# Patient Record
Sex: Female | Born: 1955 | Hispanic: No | Marital: Married | Smoking: Never smoker
Health system: Southern US, Community
[De-identification: ages and names within clinical notes are randomized; demographics above are authoritative.]

## PROBLEM LIST (undated history)

## (undated) DIAGNOSIS — N926 Irregular menstruation, unspecified: Secondary | ICD-10-CM

## (undated) DIAGNOSIS — T8859XA Other complications of anesthesia, initial encounter: Secondary | ICD-10-CM

## (undated) DIAGNOSIS — E039 Hypothyroidism, unspecified: Secondary | ICD-10-CM

## (undated) DIAGNOSIS — H269 Unspecified cataract: Secondary | ICD-10-CM

## (undated) DIAGNOSIS — M1990 Unspecified osteoarthritis, unspecified site: Secondary | ICD-10-CM

## (undated) DIAGNOSIS — K759 Inflammatory liver disease, unspecified: Secondary | ICD-10-CM

## (undated) DIAGNOSIS — Z87898 Personal history of other specified conditions: Secondary | ICD-10-CM

## (undated) DIAGNOSIS — L680 Hirsutism: Secondary | ICD-10-CM

## (undated) DIAGNOSIS — D259 Leiomyoma of uterus, unspecified: Secondary | ICD-10-CM

## (undated) DIAGNOSIS — Z8619 Personal history of other infectious and parasitic diseases: Secondary | ICD-10-CM

## (undated) DIAGNOSIS — R51 Headache: Secondary | ICD-10-CM

## (undated) DIAGNOSIS — F419 Anxiety disorder, unspecified: Secondary | ICD-10-CM

## (undated) HISTORY — DX: Unspecified cataract: H26.9

## (undated) HISTORY — DX: Personal history of other specified conditions: Z87.898

## (undated) HISTORY — DX: Leiomyoma of uterus, unspecified: D25.9

## (undated) HISTORY — PX: TONSILLECTOMY: SUR1361

## (undated) HISTORY — DX: Hypothyroidism, unspecified: E03.9

## (undated) HISTORY — DX: Personal history of other infectious and parasitic diseases: Z86.19

## (undated) HISTORY — DX: Headache: R51

## (undated) HISTORY — PX: KNEE SURGERY: SHX244

## (undated) HISTORY — PX: TONSILECTOMY, ADENOIDECTOMY, BILATERAL MYRINGOTOMY AND TUBES: SHX2538

## (undated) HISTORY — DX: Hirsutism: L68.0

## (undated) HISTORY — PX: KNEE ARTHROPLASTY: SHX992

## (undated) HISTORY — DX: Irregular menstruation, unspecified: N92.6

---

## 1996-03-31 DIAGNOSIS — N926 Irregular menstruation, unspecified: Secondary | ICD-10-CM

## 1996-03-31 DIAGNOSIS — Z87898 Personal history of other specified conditions: Secondary | ICD-10-CM

## 1996-03-31 DIAGNOSIS — L680 Hirsutism: Secondary | ICD-10-CM

## 1996-03-31 HISTORY — DX: Personal history of other specified conditions: Z87.898

## 1996-03-31 HISTORY — DX: Hirsutism: L68.0

## 1996-03-31 HISTORY — DX: Irregular menstruation, unspecified: N92.6

## 1998-12-02 ENCOUNTER — Encounter: Payer: Self-pay | Admitting: Rheumatology

## 1998-12-02 ENCOUNTER — Ambulatory Visit (HOSPITAL_COMMUNITY): Admission: RE | Admit: 1998-12-02 | Discharge: 1998-12-02 | Payer: Self-pay | Admitting: Rheumatology

## 1999-04-01 DIAGNOSIS — D259 Leiomyoma of uterus, unspecified: Secondary | ICD-10-CM

## 1999-04-01 HISTORY — DX: Leiomyoma of uterus, unspecified: D25.9

## 1999-07-31 ENCOUNTER — Ambulatory Visit (HOSPITAL_COMMUNITY): Admission: RE | Admit: 1999-07-31 | Discharge: 1999-07-31 | Payer: Self-pay | Admitting: Cardiovascular Disease

## 1999-08-08 ENCOUNTER — Encounter: Payer: Self-pay | Admitting: Cardiovascular Disease

## 1999-08-08 ENCOUNTER — Encounter: Admission: RE | Admit: 1999-08-08 | Discharge: 1999-08-08 | Payer: Self-pay | Admitting: Cardiovascular Disease

## 1999-09-22 ENCOUNTER — Inpatient Hospital Stay (HOSPITAL_COMMUNITY): Admission: AD | Admit: 1999-09-22 | Discharge: 1999-09-22 | Payer: Self-pay | Admitting: *Deleted

## 1999-12-03 ENCOUNTER — Other Ambulatory Visit: Admission: RE | Admit: 1999-12-03 | Discharge: 1999-12-03 | Payer: Self-pay | Admitting: Obstetrics and Gynecology

## 1999-12-04 ENCOUNTER — Other Ambulatory Visit: Admission: RE | Admit: 1999-12-04 | Discharge: 1999-12-04 | Payer: Self-pay | Admitting: Obstetrics and Gynecology

## 1999-12-04 ENCOUNTER — Encounter (INDEPENDENT_AMBULATORY_CARE_PROVIDER_SITE_OTHER): Payer: Self-pay

## 1999-12-13 ENCOUNTER — Inpatient Hospital Stay (HOSPITAL_COMMUNITY): Admission: AD | Admit: 1999-12-13 | Discharge: 1999-12-13 | Payer: Self-pay | Admitting: Obstetrics and Gynecology

## 2000-03-31 HISTORY — PX: DILATION AND CURETTAGE OF UTERUS: SHX78

## 2003-04-19 ENCOUNTER — Encounter (INDEPENDENT_AMBULATORY_CARE_PROVIDER_SITE_OTHER): Payer: Self-pay | Admitting: Specialist

## 2003-04-19 ENCOUNTER — Ambulatory Visit (HOSPITAL_COMMUNITY): Admission: RE | Admit: 2003-04-19 | Discharge: 2003-04-19 | Payer: Self-pay | Admitting: Gastroenterology

## 2006-03-31 HISTORY — PX: KNEE ARTHROSCOPY: SHX127

## 2007-02-15 ENCOUNTER — Ambulatory Visit (HOSPITAL_COMMUNITY): Admission: RE | Admit: 2007-02-15 | Discharge: 2007-02-15 | Payer: Self-pay | Admitting: Cardiovascular Disease

## 2007-09-29 ENCOUNTER — Encounter: Admission: RE | Admit: 2007-09-29 | Discharge: 2007-09-29 | Payer: Self-pay | Admitting: Cardiovascular Disease

## 2010-08-16 NOTE — Op Note (Signed)
NAMEJIN, SHOCKLEY                       ACCOUNT NO.:  [id number]   MEDICAL RECORD NO.:  [id number]                   PATIENT TYPE:  AMB   LOCATION:  ENDO                                 FACILITY:  [facility]   PHYSICIAN:  Anselmo Rod, M.D.               DATE OF BIRTH:  [birth year]   DATE OF PROCEDURE:  04/19/2003  DATE OF DISCHARGE:                                 OPERATIVE REPORT   PROCEDURE PERFORMED:  Colonoscopy with snare polypectomy times two and cold  biopsies times five.   ENDOSCOPIST:  Charna Elizabeth, M.D.   INSTRUMENT USED:  Olympus video colonoscope.   INDICATIONS FOR PROCEDURE:  The patient is a 55 year old Asian female with a  history of rectal bleeding on one occasion.  Rule out colonic polyps,  masses, etc. The patient has a family history of ovarian cancer in her  mother, who was diagnosed at the age of 47 but no known family history of  colon cancer.   PREPROCEDURE PREPARATION:  Informed consent was procured from the patient.  The patient was fasted for eight hours prior to the procedure and prepped  with a bottle of magnesium citrate and a gallon of GoLYTELY the night prior  to the procedure.   PREPROCEDURE PHYSICAL:  The patient had stable vital signs.  Neck supple.  Chest clear to auscultation.  S1 and S2 regular.  Abdomen soft with normal  bowel sounds.   DESCRIPTION OF PROCEDURE:  The patient was placed in left lateral decubitus  position and sedated with 100 mg of Demerol and 10 mg of Versed in slow  incremental doses.  Once the patient was adequately sedated and maintained  on low flow oxygen and continuous cardiac monitoring, the Olympus video  colonoscope was advanced from the rectum to the cecum.  The appendicular  orifice and ileocecal valve were clearly visualized and photographed.  The  terminal ileum appeared healthy and without lesions.  A small erosion was  biopsied from the cecal base.  A small sessile polyp was biopsied from the  distal  right colon.  Two small sessile polyps were snared from the rectum  and the rectosigmoid respectively and placed together in a pathology bottle.  Retroflexion in the rectum revealed no abnormalities.  The patient tolerated  the procedure well without complications.   IMPRESSION:  1. Small sessile polyp snared from rectum and rectosigmoid colon.  2. Small polyp biopsied from the distal right colon.  3. Erosion biopsied from the cecal base.  4. Normal terminal ileum.   RECOMMENDATIONS:  1. Await pathology results.  2. Avoid all nonsteroidals including aspirin for the next four weeks.  3. A high fiber diet with liberal fluid intake has been advocated.  4. Repeat colorectal cancer screening depending on pathology results.  5. Upper GI with small bowel follow-through if rectal bleeding persists.  Anselmo Rod, M.D.    JNM/MEDQ  D:  04/19/2003  T:  04/20/2003  Job:  [id number]

## 2011-10-24 ENCOUNTER — Telehealth: Payer: Self-pay | Admitting: Obstetrics and Gynecology

## 2011-11-10 ENCOUNTER — Other Ambulatory Visit: Payer: Self-pay | Admitting: Cardiovascular Disease

## 2011-11-10 ENCOUNTER — Ambulatory Visit
Admission: RE | Admit: 2011-11-10 | Discharge: 2011-11-10 | Disposition: A | Discharge status: Home / Self Care | Payer: No Typology Code available for payment source | Source: Ambulatory Visit | Attending: Cardiovascular Disease | Admitting: Cardiovascular Disease

## 2011-11-10 DIAGNOSIS — R51 Headache: Secondary | ICD-10-CM

## 2011-11-13 ENCOUNTER — Encounter: Payer: Self-pay | Admitting: Obstetrics and Gynecology

## 2011-11-13 ENCOUNTER — Ambulatory Visit (INDEPENDENT_AMBULATORY_CARE_PROVIDER_SITE_OTHER): Payer: BC Managed Care – PPO | Admitting: Obstetrics and Gynecology

## 2011-11-13 VITALS — BP 104/64 | Ht 63.75 in | Wt 228.0 lb

## 2011-11-13 DIAGNOSIS — R519 Headache, unspecified: Secondary | ICD-10-CM | POA: Insufficient documentation

## 2011-11-13 DIAGNOSIS — Z87898 Personal history of other specified conditions: Secondary | ICD-10-CM | POA: Insufficient documentation

## 2011-11-13 DIAGNOSIS — L680 Hirsutism: Secondary | ICD-10-CM | POA: Insufficient documentation

## 2011-11-13 DIAGNOSIS — R102 Pelvic and perineal pain: Secondary | ICD-10-CM

## 2011-11-13 DIAGNOSIS — L659 Nonscarring hair loss, unspecified: Secondary | ICD-10-CM | POA: Insufficient documentation

## 2011-11-13 DIAGNOSIS — Z8619 Personal history of other infectious and parasitic diseases: Secondary | ICD-10-CM | POA: Insufficient documentation

## 2011-11-13 DIAGNOSIS — E119 Type 2 diabetes mellitus without complications: Secondary | ICD-10-CM | POA: Insufficient documentation

## 2011-11-13 DIAGNOSIS — N644 Mastodynia: Secondary | ICD-10-CM

## 2011-11-13 DIAGNOSIS — Z124 Encounter for screening for malignant neoplasm of cervix: Secondary | ICD-10-CM

## 2011-11-13 DIAGNOSIS — Z01419 Encounter for gynecological examination (general) (routine) without abnormal findings: Secondary | ICD-10-CM

## 2011-11-13 DIAGNOSIS — N926 Irregular menstruation, unspecified: Secondary | ICD-10-CM | POA: Insufficient documentation

## 2011-11-13 DIAGNOSIS — N941 Unspecified dyspareunia: Secondary | ICD-10-CM | POA: Insufficient documentation

## 2011-11-13 DIAGNOSIS — E039 Hypothyroidism, unspecified: Secondary | ICD-10-CM | POA: Insufficient documentation

## 2011-11-13 DIAGNOSIS — R51 Headache: Secondary | ICD-10-CM | POA: Insufficient documentation

## 2011-11-13 DIAGNOSIS — D259 Leiomyoma of uterus, unspecified: Secondary | ICD-10-CM | POA: Insufficient documentation

## 2011-11-13 DIAGNOSIS — N949 Unspecified condition associated with female genital organs and menstrual cycle: Secondary | ICD-10-CM

## 2011-11-13 MED ORDER — ESTRADIOL 0.1 MG/GM VA CREA: TOPICAL_CREAM | VAGINAL | Status: DC

## 2011-11-13 NOTE — Progress Notes (Signed)
Subjective:  AEX:  Last Pap: 2001 WNL: Yes Regular Periods:no Contraception: Post-Menopause  Monthly Breast exam:no Tetanus<72yrs:no Nl.Bladder Function:yes Daily BMs:yes Healthy Diet:yes Calcium:no Mammogram:no never Date of Mammogram: n/a Exercise:yes Have often Exercise: occasionally Seatbelt: yes Abuse at home: no Stressful work:yes Sigmoid-colonoscopy: 2005 Bone Density: No PCP: [place].  She actually travels to [place] For an annual visit with her doctor daycare and purchases her medications there. Change in PMH: None Change in [facility]: mother died of ovarian cancer at age 36    Krystal Lang is a 56 y.o. female G2P2002 who presents for annual exam.  She was last seen in this office in 2002 and that was her last Pap smear. She complains of being hot all the time. She had hot flashes 10 years ago but they seem to have improved but now have gotten worse. She has in the interim been diagnosed with type 2 diabetes and hypothyroidism.  She complains of alopecia which is a family trait and her mother and sisters. She also has axillary accessory breast tissue which is quite tender. This is likewise a family trait. She complains of decreased libido and dyspareunia  The following portions of the patient's history were reviewed and updated as appropriate: allergies, current medications, past family history, past medical history, past social history, past surgical history and problem list.  Review of Systems Pertinent items are noted in HPI. Gastrointestinal:No change in bowel habits, no abdominal pain, no rectal bleeding Genitourinary:negative for dysuria,hematuria, nocturia and urinary incontinence the patient does admit to daytime and nighttime urinary frequency   Objective:     BP 104/64  Ht 5' 3.75" (1.619 m)  Wt 228 lb (103.42 kg)  BMI 39.44 kg/m2  Weight:  Wt Readings from Last 1 Encounters:  11/13/11 228 lb (103.42 kg)     BMI: Body mass index is 39.44  kg/(m^2). General Appearance: Alert, appropriate appearance for age. No acute distress HEENT: Grossly normal Neck / Thyroid: Supple, no masses, nodes or enlargement Lungs: clear to auscultation bilaterally Back: No CVA tenderness Breast Exam: no dominant masses discharge or skin changes. The breasts are fibrocystic and tender throughout. There is bilateral axillary accessory breast tissue with no palpable masses. Cardiovascular: Regular rate and rhythm. S1, S2, no murmur Gastrointestinal: Soft, non-tender, no masses or organomegaly Pelvic Exam: External genitalia: normal general appearance Vaginal: atrophic mucosa Cervix: atrophic Adnexa: No palpable masses Uterus: Does not feel enlarged and is nontender Exam limited by anxiety and body habitus Rectovaginal: normal rectal, no masses Lymphatic Exam: Non-palpable nodes in neck, clavicular, axillary, or inguinal regions Skin: no rash or abnormalities Neurologic: Normal gait and speech, no tremor  Psychiatric: Alert and oriented, appropriate affect.    Urinalysis:Not done      Assessment:    Menopause Vaginal atrophy  Dyspareunia probably secondary to atrophy Urinary frequency secondary to uncontrolled diabetes but also may be secondary to atrophy  Accessory breast tissue Family history of ovarian cancer in her mother Hypothyroidism with long-term Synthroid use Type 2 diabetes poorly controlled Plan:    All questions answered. Breast self exam technique reviewed and patient encouraged to perform self-exam monthly. Diagnosis explained in detail, including differential. Discussed healthy lifestyle modifications. Mammogram.as the patient has never had a mammogram. She expressed interest in plastic surgical removal of accessory breast tissue Pelvic ultrasound.  Vaginal estrogen recommended for vaginal atrophy with dyspareunia. Risks and benefits reviewed. The patient will use Estrace cream 1 g per vagina twice weekly  Follow-up:   For ultrasound and result  Needs bone density scheduled

## 2011-11-14 ENCOUNTER — Encounter (INDEPENDENT_AMBULATORY_CARE_PROVIDER_SITE_OTHER): Payer: BC Managed Care – PPO | Admitting: Ophthalmology

## 2011-11-14 ENCOUNTER — Encounter (INDEPENDENT_AMBULATORY_CARE_PROVIDER_SITE_OTHER): Payer: Self-pay | Admitting: Ophthalmology

## 2011-11-14 DIAGNOSIS — E1139 Type 2 diabetes mellitus with other diabetic ophthalmic complication: Secondary | ICD-10-CM

## 2011-11-14 DIAGNOSIS — H3530 Unspecified macular degeneration: Secondary | ICD-10-CM

## 2011-11-14 DIAGNOSIS — H43819 Vitreous degeneration, unspecified eye: Secondary | ICD-10-CM

## 2011-11-14 DIAGNOSIS — H2510 Age-related nuclear cataract, unspecified eye: Secondary | ICD-10-CM

## 2011-11-14 DIAGNOSIS — E11319 Type 2 diabetes mellitus with unspecified diabetic retinopathy without macular edema: Secondary | ICD-10-CM

## 2011-11-17 LAB — PAP IG AND HPV HIGH-RISK

## 2012-04-07 ENCOUNTER — Ambulatory Visit
Admission: RE | Admit: 2012-04-07 | Discharge: 2012-04-07 | Disposition: A | Discharge status: Home / Self Care | Payer: No Typology Code available for payment source | Source: Ambulatory Visit | Attending: Cardiovascular Disease | Admitting: Cardiovascular Disease

## 2012-04-07 ENCOUNTER — Other Ambulatory Visit: Payer: Self-pay | Admitting: Cardiovascular Disease

## 2012-04-07 DIAGNOSIS — M25519 Pain in unspecified shoulder: Secondary | ICD-10-CM

## 2012-04-28 ENCOUNTER — Other Ambulatory Visit: Payer: Self-pay | Admitting: Sports Medicine

## 2012-04-28 DIAGNOSIS — M25519 Pain in unspecified shoulder: Secondary | ICD-10-CM

## 2012-05-05 ENCOUNTER — Ambulatory Visit
Admission: RE | Admit: 2012-05-05 | Discharge: 2012-05-05 | Disposition: A | Discharge status: Home / Self Care | Payer: No Typology Code available for payment source | Source: Ambulatory Visit | Attending: Sports Medicine | Admitting: Sports Medicine

## 2012-05-05 DIAGNOSIS — M25519 Pain in unspecified shoulder: Secondary | ICD-10-CM

## 2012-11-15 ENCOUNTER — Ambulatory Visit (INDEPENDENT_AMBULATORY_CARE_PROVIDER_SITE_OTHER): Payer: BC Managed Care – PPO | Admitting: Ophthalmology

## 2013-01-31 ENCOUNTER — Other Ambulatory Visit: Payer: Self-pay | Admitting: Cardiovascular Disease

## 2013-01-31 ENCOUNTER — Ambulatory Visit
Admission: RE | Admit: 2013-01-31 | Discharge: 2013-01-31 | Disposition: A | Discharge status: Home / Self Care | Payer: No Typology Code available for payment source | Source: Ambulatory Visit | Attending: Cardiovascular Disease | Admitting: Cardiovascular Disease

## 2013-01-31 DIAGNOSIS — R05 Cough: Secondary | ICD-10-CM

## 2013-04-19 ENCOUNTER — Other Ambulatory Visit: Payer: Self-pay

## 2013-04-19 DIAGNOSIS — Z1231 Encounter for screening mammogram for malignant neoplasm of breast: Secondary | ICD-10-CM

## 2014-08-25 ENCOUNTER — Other Ambulatory Visit: Payer: Self-pay

## 2014-08-25 ENCOUNTER — Ambulatory Visit
Admission: RE | Admit: 2014-08-25 | Discharge: 2014-08-25 | Disposition: A | Discharge status: Home / Self Care | Payer: BLUE CROSS/BLUE SHIELD | Source: Ambulatory Visit

## 2014-08-25 DIAGNOSIS — Z1231 Encounter for screening mammogram for malignant neoplasm of breast: Secondary | ICD-10-CM

## 2016-01-01 ENCOUNTER — Ambulatory Visit
Admission: RE | Admit: 2016-01-01 | Discharge: 2016-01-01 | Disposition: A | Discharge status: Home / Self Care | Payer: BLUE CROSS/BLUE SHIELD | Source: Ambulatory Visit | Attending: Cardiovascular Disease | Admitting: Cardiovascular Disease

## 2016-01-01 ENCOUNTER — Other Ambulatory Visit: Payer: Self-pay | Admitting: Cardiovascular Disease

## 2016-01-01 DIAGNOSIS — M25562 Pain in left knee: Secondary | ICD-10-CM

## 2016-03-17 ENCOUNTER — Ambulatory Visit (INDEPENDENT_AMBULATORY_CARE_PROVIDER_SITE_OTHER): Payer: BLUE CROSS/BLUE SHIELD | Admitting: Orthopaedic Surgery

## 2016-03-17 ENCOUNTER — Encounter (INDEPENDENT_AMBULATORY_CARE_PROVIDER_SITE_OTHER): Payer: Self-pay | Admitting: Orthopaedic Surgery

## 2016-03-17 VITALS — BP 143/73 | HR 88 | Ht 64.0 in | Wt 180.0 lb

## 2016-03-17 DIAGNOSIS — M25562 Pain in left knee: Secondary | ICD-10-CM

## 2016-03-17 DIAGNOSIS — M25561 Pain in right knee: Secondary | ICD-10-CM

## 2016-03-17 DIAGNOSIS — G8929 Other chronic pain: Secondary | ICD-10-CM | POA: Diagnosis not present

## 2016-03-17 MED ORDER — METHYLPREDNISOLONE ACETATE 40 MG/ML IJ SUSP
80.0000 mg | INTRAMUSCULAR | Status: AC | PRN
  Administered 2016-03-17: 80 mg

## 2016-03-17 MED ORDER — BUPIVACAINE HCL 0.5 % IJ SOLN
3.0000 mL | INTRAMUSCULAR | Status: AC | PRN
  Administered 2016-03-17: 3 mL via INTRA_ARTICULAR

## 2016-03-17 MED ORDER — LIDOCAINE HCL 1 % IJ SOLN
5.0000 mL | INTRAMUSCULAR | Status: AC | PRN
  Administered 2016-03-17: 5 mL

## 2016-03-17 NOTE — Progress Notes (Signed)
Office Visit Note   Patient: Krystal Lang           Date of Birth: [birth year]           MRN: [id number] Visit Date: 03/17/2016              Requested by: Dixie Dials, MD [place],  [id number] PCP: Birdie Riddle, MD   Assessment & Plan: Visit Diagnoses: Bilateral knee osteoarthritis  Plan: Cortisone injection right knee and follow up in 1 month. We had a long discussion regarding the arthritis and potential treatment over time. I think it would be worthwhile to monitor her response to the cortisone and consider a cortisone injection in the left knee and possibly even Visco supplementation  Follow-Up Instructions: No Follow-up on file.   Orders:  No orders of the defined types were placed in this encounter.  No orders of the defined types were placed in this encounter.     Procedures: Large Joint Inj Date/Time: 03/17/2016 3:27 PM Performed by: Garald Balding Authorized by: Garald Balding   Consent Given by:  Patient Timeout: prior to procedure the correct patient, procedure, and site was verified   Indications:  Pain and joint swelling Location:  Knee Site:  R knee Prep: patient was prepped and draped in usual sterile fashion   Needle Size:  25 G Needle Length:  1.5 inches Approach:  Anteromedial Ultrasound Guidance: No   Fluoroscopic Guidance: No   Arthrogram: No   Medications:  5 mL lidocaine 1 %; 80 mg methylPREDNISolone acetate 40 MG/ML; 3 mL bupivacaine 0.5 % Aspiration Attempted: No   Patient tolerance:  Patient tolerated the procedure well with no immediate complications     Clinical Data: No additional findings.   Subjective: Chief Complaint  Patient presents with  . Right Knee - Pain  . Left Knee - Pain    Pt having BIL knee pain, she is having weakness, pain and trouble walking. Especially having difficulty getting on/off toilet.  She went to Dr. Jolee Ewing  for xrays  Dr. Charlestine Night has been following Mrs  Bessant for bilateral knee osteoarthritis. A cortisone injection was performed in the left knee several months ago with questionable result. He does have trouble with both of her knees with soreness and achiness particularly when she's been on her feet for any length of time. She denies back pain or radiculopathy. She has tried ibuprofen with some relief. She will on occasion have some swelling in both knees.  Review of Systems   Objective: Vital Signs: There were no vitals taken for this visit.  Physical Exam  Ortho Exam on examination of the right knee there was no effusion there was diffuse and yet mild lateral joint pain associated with some patellar crepitation. No evidence of instability. Slight increased valgus. There was no calf pain or popliteal fullness. There was no swelling distally neurovascular exam was intact  There was a small left knee effusion associated with lateral joint pain and some patella crepitation. No instability. No swelling distally. No thigh pain. No pain with range of motion of either hip. Straight leg raise is negative bilaterally.  Specialty Comments:  No specialty comments available.  Imaging: No results found.   PMFS History: Patient Active Problem List   Diagnosis Date Noted  . Dyspareunia, female 11/13/2011  . Type 2 diabetes mellitus ([place]) 11/13/2011  . Alopecia 11/13/2011  . History of chicken pox   . Headache(784.0)   . Hypothyroidism   .  Menses, irregular   . Hirsutism   . H/O fatigue   . Fibroid uterus    Past Medical History:  Diagnosis Date  . Diabetes mellitus   . Fibroid uterus 2001  . H/O fatigue 1998  . Headache(784.0)   . 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 History of chicken pox   . Hypothyroidism   . Menses, irregular 1998    Family History  Problem Relation Age of Onset  . Diabetes Mother   . Diabetes Father   . Heart disease Father     MI  . Diabetes Sister   . Diabetes Brother   . Heart disease Brother      Angioplasty    Past Surgical History:  Procedure Laterality Date  . DILATION AND CURETTAGE OF UTERUS  2002   In [place]  . KNEE SURGERY    . TONSILECTOMY, ADENOIDECTOMY, BILATERAL MYRINGOTOMY AND TUBES    . TONSILLECTOMY     Social History   Occupational History  . Not on file.   Social History Main Topics  . Smoking status: Never Smoker  . Smokeless tobacco: Never Used  . Alcohol use No  . Drug use: No  . Sexual activity: Yes    Birth control/ protection: Post-menopausal

## 2016-04-30 ENCOUNTER — Telehealth (INDEPENDENT_AMBULATORY_CARE_PROVIDER_SITE_OTHER): Payer: Self-pay | Admitting: Orthopaedic Surgery

## 2016-04-30 ENCOUNTER — Ambulatory Visit (INDEPENDENT_AMBULATORY_CARE_PROVIDER_SITE_OTHER): Payer: BLUE CROSS/BLUE SHIELD | Admitting: Orthopedic Surgery

## 2016-04-30 NOTE — Telephone Encounter (Signed)
Dr. Doylene Canard came by about his wife and would like to speak to Dr. Durward Fortes about his wife's knees. Please call him back at [phone]

## 2016-04-30 NOTE — Telephone Encounter (Signed)
This has been taken care of, no need to call.

## 2016-05-01 ENCOUNTER — Ambulatory Visit (INDEPENDENT_AMBULATORY_CARE_PROVIDER_SITE_OTHER): Payer: BLUE CROSS/BLUE SHIELD | Admitting: Orthopaedic Surgery

## 2016-05-01 ENCOUNTER — Other Ambulatory Visit (INDEPENDENT_AMBULATORY_CARE_PROVIDER_SITE_OTHER): Payer: Self-pay

## 2016-05-01 DIAGNOSIS — G8929 Other chronic pain: Secondary | ICD-10-CM | POA: Diagnosis not present

## 2016-05-01 DIAGNOSIS — M25562 Pain in left knee: Secondary | ICD-10-CM | POA: Diagnosis not present

## 2016-05-01 MED ORDER — BUPIVACAINE HCL 0.5 % IJ SOLN
3.0000 mL | INTRAMUSCULAR | Status: AC | PRN
  Administered 2016-05-01: 3 mL via INTRA_ARTICULAR

## 2016-05-01 MED ORDER — LIDOCAINE HCL 1 % IJ SOLN
5.0000 mL | INTRAMUSCULAR | Status: AC | PRN
  Administered 2016-05-01: 5 mL

## 2016-05-01 MED ORDER — TRAMADOL HCL 50 MG PO TABS
50.0000 mg | ORAL_TABLET | Freq: Two times a day (BID) | ORAL | 0 refills | Status: DC | PRN
Start: 2016-05-01 — End: 2016-07-08

## 2016-05-01 MED ORDER — METHYLPREDNISOLONE ACETATE 40 MG/ML IJ SUSP
80.0000 mg | INTRAMUSCULAR | Status: AC | PRN
  Administered 2016-05-01: 80 mg

## 2016-05-01 NOTE — Progress Notes (Signed)
Office Visit Note   Patient: Krystal Lang           Date of Birth: February 15, 1956           MRN: [id number] Visit Date: 05/01/2016              Requested by: Dixie Dials, MD [place], [place] [id number] PCP: Birdie Riddle, MD   Assessment & Plan: Visit Diagnoses: Osteoarthritis left knee   Plan: Cortisone injection, follow-up as needed. Prescription for tramadol for her travels if necessary.  Follow-Up Instructions: No Follow-up on file.   Orders:  No orders of the defined types were placed in this encounter.  No orders of the defined types were placed in this encounter.     Procedures: Large Joint Inj Date/Time: 05/01/2016 3:12 PM Performed by: Garald Balding Authorized by: Garald Balding   Consent Given by:  Patient Timeout: prior to procedure the correct patient, procedure, and site was verified   Indications:  Pain and joint swelling Location:  Knee Site:  L knee Prep: patient was prepped and draped in usual sterile fashion   Needle Size:  25 G Needle Length:  1.5 inches Approach:  Anteromedial Ultrasound Guidance: No   Fluoroscopic Guidance: No   Arthrogram: No   Medications:  5 mL lidocaine 1 %; 80 mg methylPREDNISolone acetate 40 MG/ML; 3 mL bupivacaine 0.5 % Aspiration Attempted: No   Patient tolerance:  Patient tolerated the procedure well with no immediate complications     Clinical Data: No additional findings.   Subjective: Chief Complaint  Patient presents with  . Left Knee - Pain    Left knee pain, pt wants injection, leaving for [place] soon.  Had a successful right knee injection recently and having some pain in the left knee but he is concerned about having pain when she is in [place].  Review of Systems   Objective: Vital Signs: There were no vitals taken for this visit.  Physical Exam  Ortho Exam left knee with minimal effusion. Slight increased varus. No instability with a varus or valgus stress. No  swelling distally. Mild patellar crepitation. Full extension and over 105 of flexion. No popliteal pain or calf discomfort.  Specialty Comments:  No specialty comments available.  Imaging: No results found.   PMFS History: Patient Active Problem List   Diagnosis Date Noted  . Dyspareunia, female 11/13/2011  . Type 2 diabetes mellitus ([place]) 11/13/2011  . Alopecia 11/13/2011  . History of chicken pox   . Headache(784.0)   . Hypothyroidism   . Menses, irregular   . Hirsutism   . H/O fatigue   . Fibroid uterus    Past Medical History:  Diagnosis Date  . Diabetes mellitus   . Fibroid uterus 2001  . H/O fatigue 1998  . Headache(784.0)   . 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 History of chicken pox   . Hypothyroidism   . Menses, irregular 1998    Family History  Problem Relation Age of Onset  . Diabetes Mother   . Diabetes Father   . Heart disease Father     MI  . Diabetes Sister   . Diabetes Brother   . Heart disease Brother     Angioplasty    Past Surgical History:  Procedure Laterality Date  . DILATION AND CURETTAGE OF UTERUS  2002   In [place]  . KNEE SURGERY    . TONSILECTOMY, ADENOIDECTOMY, BILATERAL MYRINGOTOMY AND TUBES    .  TONSILLECTOMY     Social History   Occupational History  . Not on file.   Social History Main Topics  . Smoking status: Never Smoker  . Smokeless tobacco: Never Used  . Alcohol use No  . Drug use: No  . Sexual activity: Yes    Birth control/ protection: Post-menopausal

## 2016-07-07 ENCOUNTER — Other Ambulatory Visit: Payer: Self-pay | Admitting: Cardiovascular Disease

## 2016-07-07 ENCOUNTER — Telehealth (INDEPENDENT_AMBULATORY_CARE_PROVIDER_SITE_OTHER): Payer: Self-pay | Admitting: Orthopaedic Surgery

## 2016-07-07 ENCOUNTER — Ambulatory Visit
Admission: RE | Admit: 2016-07-07 | Discharge: 2016-07-07 | Disposition: A | Discharge status: Home / Self Care | Payer: BLUE CROSS/BLUE SHIELD | Source: Ambulatory Visit | Attending: Cardiovascular Disease | Admitting: Cardiovascular Disease

## 2016-07-07 DIAGNOSIS — M545 Low back pain: Secondary | ICD-10-CM

## 2016-07-07 DIAGNOSIS — R102 Pelvic and perineal pain: Secondary | ICD-10-CM

## 2016-07-07 NOTE — Telephone Encounter (Signed)
Can you call if anything available?

## 2016-07-07 NOTE — Telephone Encounter (Signed)
Patient has scheduled appointment on 07/14/16 for left hip pain but would like to know if she should be seen sooner due to the numbness in her thighs as a result of the hip pain? Please advise.

## 2016-07-08 ENCOUNTER — Other Ambulatory Visit (INDEPENDENT_AMBULATORY_CARE_PROVIDER_SITE_OTHER): Payer: Self-pay

## 2016-07-08 ENCOUNTER — Ambulatory Visit (INDEPENDENT_AMBULATORY_CARE_PROVIDER_SITE_OTHER): Payer: BLUE CROSS/BLUE SHIELD | Admitting: Orthopaedic Surgery

## 2016-07-08 ENCOUNTER — Encounter (INDEPENDENT_AMBULATORY_CARE_PROVIDER_SITE_OTHER): Payer: Self-pay | Admitting: Orthopaedic Surgery

## 2016-07-08 VITALS — BP 138/74 | HR 74 | Ht 64.0 in | Wt 180.0 lb

## 2016-07-08 DIAGNOSIS — M5442 Lumbago with sciatica, left side: Secondary | ICD-10-CM | POA: Diagnosis not present

## 2016-07-08 DIAGNOSIS — M5440 Lumbago with sciatica, unspecified side: Secondary | ICD-10-CM

## 2016-07-08 MED ORDER — METHYLPREDNISOLONE 4 MG PO TBPK: ORAL_TABLET | ORAL | 0 refills | Status: DC

## 2016-07-08 NOTE — Progress Notes (Signed)
Office Visit Note   Patient: Krystal Lang           Date of Birth: November 10, 1955           MRN: [id number] Visit Date: 07/08/2016              Requested by: Dixie Dials, MD [address] [place], [place] [address] PCP: Birdie Riddle, MD   Assessment & Plan: Visit Diagnoses:  1. Acute left-sided low back pain with left-sided sciatica     Plan: Continue with hydrocodone 5-10 mg 3 times a day, Medrol Dosepak, MRI scan if no improvement. Dr Doylene Canard  will monitor the blood sugars.  Follow-Up Instructions: Return end of week if no improvement.   Orders:  No orders of the defined types were placed in this encounter.  Meds ordered this encounter  Medications  . methylPREDNISolone (MEDROL DOSEPAK) 4 MG TBPK tablet    Sig: Take as directed per package instructions    Dispense:  21 tablet    Refill:  0      Procedures: No procedures performed   Clinical Data: No additional findings.   Subjective: Chief Complaint  Patient presents with  . Left Hip - Pain, Numbness    Denies low back pain Numbness in thigh area Hard to sleep Constant pain    HPIMrs Lang experienced rather sudden onset of left buttock and hip pain approximately 5-6 days ago. Pain has persisted to the point of sleep deprivation. She's been taking hydrocodone 5-10 mg once or  twice a day without much relief. She denies fever or chills. She is not experiencing any pain in the right lower extremity. Pain in her left hip region is not in the groin and does not radiate distal to the knee. She also was experiencing some "numbness" in the lateral left thigh. She is diabetic and is being controlled with medicines per her husband who is a physician. Krystal Lang had films yesterday at [facility] imaging of her lumbar spine revealing mild degenerative changes but without acute fracture or malalignment. She also had an AP of her pelvis that was negative without evidence of bony lesions and I reviewed the films  with Dr. and Krystal. Deller  Review of Systems   Objective: Vital Signs: BP 138/74   Pulse 74   Ht 5\' 4"  (1.626 m)   Wt 180 lb (81.6 kg)   BMI 30.90 kg/m   Physical Exam  Ortho Exam straight leg raise negative bilaterally. Deep tendon reflexes are symmetrical. Painless range of motion of both hips and knees. Neurovascular exam intact distally. No percussible or palpable tenderness along the lateral aspect of her left hip. No percussible tenderness of the lumbar spine  Specialty Comments:  No specialty comments available.  Imaging: Dg Lumbar Spine Complete  Result Date: 07/07/2016 CLINICAL DATA:  New onset low back pain, LEFT hip pain and numbness for 2-3 days. No injury. EXAM: LUMBAR SPINE - COMPLETE 4+ VIEW COMPARISON:  None. FINDINGS: Five non rib-bearing lumbar-type vertebral bodies are intact and aligned with maintenance of the lumbar lordosis. Severe L4-5 disc height loss, endplate sclerosis and marginal spurring compatible with degenerative disc, mild at L1-2. Mild lower lumbar facet arthropathy. No destructive bony lesions. Sacroiliac joints are symmetric, mild osteoarthrosis. Included prevertebral and paraspinal soft tissue planes are non-suspicious. 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 Mild vascular calcifications. IMPRESSION: Degenerative change of the lumbar spine without acute fracture deformity or malalignment. Electronically Signed   By: Sandie Ano  Bloomer M.D.   On: 07/07/2016 18:03   Dg Pelvis 1-2 Views  Result Date: 07/07/2016 CLINICAL DATA:  Low back pain and pain in LEFT hip. EXAM: PELVIS - 1-2 VIEW COMPARISON:  None. FINDINGS: There is no evidence of pelvic fracture or diastasis. No pelvic bone lesions are seen. Pelvic calcification likely representing a fibroid. IMPRESSION: Negative. Electronically Signed   By: Staci Righter M.D.   On: 07/07/2016 16:55     PMFS History: Patient Active Problem List   Diagnosis Date Noted  .  Dyspareunia, female 11/13/2011  . Type 2 diabetes mellitus ([place]) 11/13/2011  . Alopecia 11/13/2011  . History of chicken pox   . Headache(784.0)   . Hypothyroidism   . Menses, irregular   . Hirsutism   . H/O fatigue   . Fibroid uterus    Past Medical History:  Diagnosis Date  . Diabetes mellitus   . Fibroid uterus 2001  . H/O fatigue 1998  . Headache(784.0)   . 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 History of chicken pox   . Hypothyroidism   . Menses, irregular 1998    Family History  Problem Relation Age of Onset  . Diabetes Mother   . Diabetes Father   . Heart disease Father     MI  . Diabetes Sister   . Diabetes Brother   . Heart disease Brother     Angioplasty    Past Surgical History:  Procedure Laterality Date  . DILATION AND CURETTAGE OF UTERUS  2002   In [place]  . KNEE SURGERY    . TONSILECTOMY, ADENOIDECTOMY, BILATERAL MYRINGOTOMY AND TUBES    . TONSILLECTOMY     Social History   Occupational History  . Not on file.   Social History Main Topics  . Smoking status: Never Smoker  . Smokeless tobacco: Never Used  . Alcohol use No  . Drug use: No  . Sexual activity: Yes    Birth control/ protection: Post-menopausal

## 2016-07-14 ENCOUNTER — Ambulatory Visit (INDEPENDENT_AMBULATORY_CARE_PROVIDER_SITE_OTHER): Payer: Self-pay | Admitting: Orthopaedic Surgery

## 2016-07-14 ENCOUNTER — Telehealth (INDEPENDENT_AMBULATORY_CARE_PROVIDER_SITE_OTHER): Payer: Self-pay | Admitting: Orthopaedic Surgery

## 2016-07-14 NOTE — Telephone Encounter (Signed)
Dr. Durward Fortes asked her to call him.  She is returning his call. [phone].  Thank you.

## 2016-07-14 NOTE — Telephone Encounter (Signed)
Called pt and explained that [facility] . Gave her their number for her husband to call.

## 2016-07-14 NOTE — Telephone Encounter (Signed)
MRI L-S spine-stat

## 2016-07-14 NOTE — Telephone Encounter (Signed)
See message.

## 2016-07-22 ENCOUNTER — Ambulatory Visit
Admission: RE | Admit: 2016-07-22 | Discharge: 2016-07-22 | Disposition: A | Discharge status: Home / Self Care | Payer: BLUE CROSS/BLUE SHIELD | Source: Ambulatory Visit | Attending: Orthopaedic Surgery | Admitting: Orthopaedic Surgery

## 2016-07-22 DIAGNOSIS — M5440 Lumbago with sciatica, unspecified side: Secondary | ICD-10-CM

## 2016-07-23 ENCOUNTER — Other Ambulatory Visit (INDEPENDENT_AMBULATORY_CARE_PROVIDER_SITE_OTHER): Payer: Self-pay

## 2016-07-23 DIAGNOSIS — M5442 Lumbago with sciatica, left side: Secondary | ICD-10-CM

## 2016-07-23 NOTE — Progress Notes (Signed)
Set up an appt with Dr Ernestina Patches

## 2016-10-16 ENCOUNTER — Ambulatory Visit: Payer: BLUE CROSS/BLUE SHIELD | Attending: Cardiovascular Disease

## 2016-10-16 DIAGNOSIS — M25661 Stiffness of right knee, not elsewhere classified: Secondary | ICD-10-CM | POA: Insufficient documentation

## 2016-10-16 DIAGNOSIS — G8929 Other chronic pain: Secondary | ICD-10-CM | POA: Insufficient documentation

## 2016-10-16 DIAGNOSIS — M6281 Muscle weakness (generalized): Secondary | ICD-10-CM | POA: Insufficient documentation

## 2016-10-16 DIAGNOSIS — M25561 Pain in right knee: Secondary | ICD-10-CM | POA: Insufficient documentation

## 2016-10-16 DIAGNOSIS — R2689 Other abnormalities of gait and mobility: Secondary | ICD-10-CM | POA: Insufficient documentation

## 2016-10-16 NOTE — Patient Instructions (Addendum)
   Quad Set   With other leg bent, foot flat, slowly tighten muscles on thigh of straight leg while counting out loud to _5___. Repeat _10-20___ times. Do __5Hip Flexion / Knee Extension: Straight-Leg Raise (Eccentric)   Heel Slides   Use a strap or towel to Slide left heel along bed towards bottom. Hold for 10 __ seconds. Perform gently. Slide back to flat knee position. Repeat 10___ times. Do _3__ times a day.    KNEE: Extension, Long Arc Quads - Sitting    Raise leg until knee is straight.  Hold 5 seconds __10_ reps per set, _3-4__ sets per day  Copyright  VHI. All rights reserved.  HIP: Hamstrings - Short Sitting    Rest leg on raised surface. Keep knee straight. Lift chest. Hold _20__ seconds. 3___ reps per set, _3__ sets per day,  Piriformis Stretch, Sitting    Sit, one ankle on opposite knee, same-side hand on crossed knee. Push down on knee, keeping spine straight. Lean torso forward, with flat back, until tension is felt in hamstrings and gluteals of crossed-leg side. Hold _20__ seconds.  Repeat _3__ times per session. Do _3__ sessions per day.  Copyright  VHI. All rights reserved.   [facility] [address] [place], [place] [address] Phone # [phone] Fax [phone]

## 2016-10-16 NOTE — Therapy (Signed)
[facility] [address] [place], [place], [address] Phone: [phone]   Fax:  [phone]  Physical Therapy Evaluation  Patient Details  Name: Krystal Lang MRN: [id number] Date of Birth: 10/06/1955 Referring Provider: Dixie Dials, MD  Encounter Date: 10/16/2016      PT End of Session - 10/16/16 1022    Visit Number 1   Date for PT Re-Evaluation 12/11/16   PT Start Time 0930   PT Stop Time 1011   PT Time Calculation (min) 41 min   Activity Tolerance Patient tolerated treatment well   Behavior During Therapy [facility] for tasks assessed/performed      Past Medical History:  Diagnosis Date  . Diabetes mellitus   . Fibroid uterus 2001  . H/O fatigue 1998  . Headache(784.0)   . 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 History of chicken pox   . Hypothyroidism   . Menses, irregular 1998    Past Surgical History:  Procedure Laterality Date  . DILATION AND CURETTAGE OF UTERUS  2002   In [place]  . KNEE SURGERY    . TONSILECTOMY, ADENOIDECTOMY, BILATERAL MYRINGOTOMY AND TUBES    . TONSILLECTOMY      There were no vitals filed for this visit.       Subjective Assessment - 10/16/16 0938    Subjective Pt presents to PT with complaints of Rt knee pain of a chronic nature.  Pt had arthroscopic knee surgery 10 years ago.  Pt had steriod injection 6 months ago and the effect has now worn off.     Pertinent History Rt arthroscopic knee surgery 10 years ago   Limitations Sitting;Walking   How long can you sit comfortably? pain after sitting   How long can you walk comfortably? slow mobility, antalgia   Diagnostic tests none   Patient Stated Goals reduce Rt knee pain   Currently in Pain? Yes   Pain Score 1   up to 8/10 after sitting   Pain Location Knee   Pain Orientation Right   Pain Descriptors / Indicators Tightness   Pain Type Chronic pain   Pain Frequency Intermittent   Aggravating Factors  after sitting and  then trying to walk, descending steps   Pain Relieving Factors stretch            [facility] PT Assessment - 10/16/16 0001      Assessment   Medical Diagnosis Rt knee pain, OA   Referring Provider Dixie Dials, MD   Onset Date/Surgical Date 10/17/15   Next MD Visit none   Prior Therapy none     Precautions   Precautions None     Restrictions   Weight Bearing Restrictions No     Balance Screen   Has the patient fallen in the past 6 months No   Has the patient had a decrease in activity level because of a fear of falling?  No   Is the patient reluctant to leave their home because of a fear of falling?  No     Home Environment   Living Environment Private residence   Type of [place] Two level     Prior Function   Level of Independence Independent   Vocation Full time employment   Leisure cooking, reading books     Cognition   Overall Cognitive Status Within Functional Limits for tasks assessed     Observation/Other Assessments   Focus on Therapeutic Outcomes (FOTO)  59% limitation     ROM / Strength   AROM / PROM / Strength AROM;PROM;Strength     AROM   Overall AROM  Deficits   Overall AROM Comments Rt hip IR limited by 50% due to muscle restrictions   AROM Assessment Site Knee   Right/Left Knee Right;Left   Right Knee Extension 5   Right Knee Flexion 100   Left Knee Extension 0   Left Knee Flexion 120     Strength   Overall Strength Deficits   Overall Strength Comments Rt knee 4/5, ankle 4+/5, hip flexion 4/5.  Lt knee 4+/5, hip flexion 4+/5, ankle 4+/5     Palpation   Patella mobility reduced patellar mobility with crepitus in all directions   Palpation comment distal hamstring insertion and bil knee joints.     Transfers   Transfers Sit to Stand;Stand to Sit   Sit to Stand With upper extremity assist   Stand to Sit With upper extremity assist     Ambulation/Gait   Ambulation/Gait Yes   Gait Pattern Step-through pattern;Decreased  step length - right;Decreased stance time - right;Antalgic   Stairs Yes   Stairs Assistance 6: Modified independent (Device/Increase time)   Stair Management Technique One rail Right;Step to pattern  step to with descending   Number of Stairs 4   Height of Stairs 6            Objective measurements completed on examination: See above findings.                  PT Education - 10/16/16 1008    Education provided Yes   Education Details seated flexibility, supine flexibility, strength   Person(s) Educated Patient   Methods Explanation;Demonstration;Handout   Comprehension Verbalized understanding;Returned demonstration          PT Short Term Goals - 10/16/16 1011      PT SHORT TERM GOAL #1   Title be independent in initial HEP   Time 4   Period Weeks     PT SHORT TERM GOAL #2   Title report < or = to 5/10 Rt knee pain with walking upon standing   Time 4   Period Weeks   Status New     PT SHORT TERM GOAL #3   Title demonstrate full Rt knee A/ROM extension to normalize gait pattern   Time 4   Period Weeks   Status New           PT Long Term Goals - 10/16/16 0930      PT LONG TERM GOAL #1   Title be independent in advanced HEP   Time 8   Status New     PT LONG TERM GOAL #2   Title reduce FOTO to < or = to 45% limitation   Time 8   Period Weeks   Status New     PT LONG TERM GOAL #3   Title improve strength and decrease pain to descend steps with step-over-step gait > or = to 50% of the time   Time 8   Period Weeks   Status New     PT LONG TERM GOAL #4   Title report < or = to 3/10 Rt knee pain with walking upon standing   Time 8   Period Weeks   Status New     PT LONG TERM GOAL #5   Title demonstrate symmetry with ambulation on level surface   Time 8   Status New  Additional Long Term Goals   Additional Long Term Goals Yes     PT LONG TERM GOAL #6   Title demonstrate > or = to 115 degrees of Rt knee A/ROM flexion to  improve squatting   Time 8   Period Weeks   Status New                Plan - 10/16/16 1016    Clinical Impression Statement Pt presents to PT with complaints of chronic Rt knee pain due to OA.  Pt reports that this has been worsening over the past few months and gait pattern is antalgic and A/ROM limited.  Pt had cortizone injection 6 months ago.  Pt would like to avoid total knee replacement until after she retires.  Pt demonstrates antalgic gait upon standing after sitting a long period, limited Rt knee A/ROM and strength, step-to gait with descending steps, and report of up to 8/10 pain with walking.  Pt with palpable tenderness at distal hamstrings and crepitus with patellar mobs.  Pt will benefit from skilled PT for Rt knee strength, flexibility, pain management and gait.   History and Personal Factors relevant to plan of care: none   Clinical Presentation Evolving   Clinical Decision Making Low   PT Frequency 2x / week   PT Duration 8 weeks   PT Treatment/Interventions ADLs/Self Care Home Management;Cryotherapy;Electrical Stimulation;Iontophoresis 4mg /ml Dexamethasone;Moist Heat;Ultrasound;Gait training;Stair training;Functional mobility training;Patient/family education;Neuromuscular re-education;Balance training;Therapeutic exercise;Therapeutic activities;Manual techniques;Passive range of motion;Vasopneumatic Device;Taping;Dry needling   PT Next Visit Plan gastroc, hamstring and quad flexibility, strength, pain management   Consulted and Agree with Plan of Care Patient      Patient will benefit from skilled therapeutic intervention in order to improve the following deficits and impairments:  Decreased strength, Increased edema, Pain, Decreased activity tolerance, Decreased endurance, Decreased range of motion, Difficulty walking  Visit Diagnosis: Chronic pain of right knee - Plan: PT plan of care cert/re-cert  Stiffness of right knee, not elsewhere classified - Plan: PT plan  of care cert/re-cert  Other abnormalities of gait and mobility - Plan: PT plan of care cert/re-cert  Muscle weakness (generalized) - Plan: PT plan of care cert/re-cert     Problem List Patient Active Problem List   Diagnosis Date Noted  . Dyspareunia, female 11/13/2011  . Type 2 diabetes mellitus ([place]) 11/13/2011  . Alopecia 11/13/2011  . History of chicken pox   . Headache(784.0)   . Hypothyroidism   . Menses, irregular   . Hirsutism   . H/O fatigue   . Fibroid uterus      Sigurd Sos, PT 10/16/16 10:24 AM  [facility] [address], [place], [place], [address] Phone: [phone]   Fax:  [phone]  Name: Krystal Lang MRN: [id number] Date of Birth: 03/12/1956

## 2016-10-21 ENCOUNTER — Ambulatory Visit: Payer: BLUE CROSS/BLUE SHIELD | Admitting: Physical Therapy

## 2016-10-21 ENCOUNTER — Encounter: Payer: Self-pay | Admitting: Physical Therapy

## 2016-10-21 DIAGNOSIS — G8929 Other chronic pain: Secondary | ICD-10-CM

## 2016-10-21 DIAGNOSIS — M25661 Stiffness of right knee, not elsewhere classified: Secondary | ICD-10-CM

## 2016-10-21 DIAGNOSIS — R2689 Other abnormalities of gait and mobility: Secondary | ICD-10-CM

## 2016-10-21 DIAGNOSIS — M25561 Pain in right knee: Principal | ICD-10-CM

## 2016-10-21 DIAGNOSIS — M6281 Muscle weakness (generalized): Secondary | ICD-10-CM

## 2016-10-21 NOTE — Therapy (Addendum)
[facility] [address] [place], [place], [address] Phone: [phone]   Fax:  [phone]  Physical Therapy Treatment  Patient Details  Name: Krystal Lang MRN: [id number] Date of Birth: 1955/12/25 Referring Provider: Dixie Dials, MD  Encounter Date: 10/21/2016      PT End of Session - 10/21/16 1230    Visit Number 2   Date for PT Re-Evaluation 12/11/16   Authorization Type [employer]   Authorization - Visit Number 2   Authorization - Number of Visits 30   PT Start Time 7564   PT Stop Time 1230   PT Time Calculation (min) 45 min   Activity Tolerance Patient tolerated treatment well   Behavior During Therapy [facility] for tasks assessed/performed      Past Medical History:  Diagnosis Date  . Diabetes mellitus   . Fibroid uterus 2001  . H/O fatigue 1998  . Headache(784.0)   . 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 History of chicken pox   . Hypothyroidism   . Menses, irregular 1998    Past Surgical History:  Procedure Laterality Date  . DILATION AND CURETTAGE OF UTERUS  2002   In [place]  . KNEE SURGERY    . TONSILECTOMY, ADENOIDECTOMY, BILATERAL MYRINGOTOMY AND TUBES    . TONSILLECTOMY      There were no vitals filed for this visit.      Subjective Assessment - 10/21/16 1155    Subjective I was feeling musch better after initial evaluation.  My knee was not locking as much.    Pertinent History Rt arthroscopic knee surgery 10 years ago   Limitations Sitting;Walking   How long can you sit comfortably? pain after sitting   How long can you walk comfortably? slow mobility, antalgia   Diagnostic tests none   Patient Stated Goals reduce Rt knee pain   Currently in Pain? Yes   Pain Score 2    Pain Location Knee   Pain Orientation Right   Pain Descriptors / Indicators Tightness   Pain Type Chronic pain   Pain Onset More than a month ago   Pain Frequency Intermittent   Aggravating Factors  after sitting and  then trying to walk, descending steps   Pain Relieving Factors stretch   Multiple Pain Sites No            [facility] PT Assessment - 10/21/16 0001      AROM   Right Knee Extension 0  after treatment   Right Knee Flexion 116  after therapy                     [facility] Adult PT Treatment/Exercise - 10/21/16 0001      Exercises   Exercises Knee/Hip     Knee/Hip Exercises: Stretches   Active Hamstring Stretch Both;2 reps;30 seconds   Active Hamstring Stretch Limitations sitting   Piriformis Stretch Left;Right;30 seconds   Piriformis Stretch Limitations supine     Knee/Hip Exercises: Aerobic   Nustep level 3 arms #10, seat #7 7 min     Knee/Hip Exercises: Standing   Forward Step Up Right;1 set;10 reps;Hand Hold: 1;Step Height: 4"   Step Down 1 set;10 reps;Hand Hold: 1;Right;Step Height: 4"   Rebounder 3 way 1 min each     Knee/Hip Exercises: Seated   Long Arc Quad Right;AROM;20 reps  to increase ROM     Manual Therapy   Manual Therapy Joint mobilization;Soft tissue mobilization   Manual  therapy comments PROM to right knee for flexion and extension   Joint Mobilization right patella, right knee joint   Soft tissue mobilization right iliotibial band, right quads                PT Education - 10/21/16 1230    Education provided Yes   Education Details stretches   Person(s) Educated Patient   Methods Explanation;Demonstration;Verbal cues;Handout   Comprehension Verbalized understanding;Returned demonstration          PT Short Term Goals - 10/21/16 1231      PT SHORT TERM GOAL #1   Title be independent in initial HEP   Time 4   Period Weeks   Status On-going     PT SHORT TERM GOAL #2   Title report < or = to 5/10 Rt knee pain with walking upon standing   Time 4   Period Weeks   Status On-going  6/10     PT SHORT TERM GOAL #3   Title demonstrate full Rt knee A/ROM extension to normalize gait pattern   Time 4   Period Weeks   Status  On-going           PT Long Term Goals - 10/16/16 0930      PT LONG TERM GOAL #1   Title be independent in advanced HEP   Time 8   Status New     PT LONG TERM GOAL #2   Title reduce FOTO to < or = to 45% limitation   Time 8   Period Weeks   Status New     PT LONG TERM GOAL #3   Title improve strength and decrease pain to descend steps with step-over-step gait > or = to 50% of the time   Time 8   Period Weeks   Status New     PT LONG TERM GOAL #4   Title report < or = to 3/10 Rt knee pain with walking upon standing   Time 8   Period Weeks   Status New     PT LONG TERM GOAL #5   Title demonstrate symmetry with ambulation on level surface   Time 8   Status New     Additional Long Term Goals   Additional Long Term Goals Yes     PT LONG TERM GOAL #6   Title demonstrate > or = to 115 degrees of Rt knee A/ROM flexion to improve squatting   Time 8   Period Weeks   Status New               Plan - 10/21/16 1223    Clinical Impression Statement After therapy, patient PROM of right knee fleixon 116 and extension 0 degrees.  Patient has trigger points in right iliotibial band and right lateral quads.  Decreased motion of right patella.  Patient feels better since intial evaluation. Patient has difficulty stretching her right quads.  Patient will benefit from skilled therapy to improve right knee strength, flexibility , pain management and gait.    Rehab Potential Excellent   PT Frequency 2x / week   PT Duration 8 weeks   PT Treatment/Interventions ADLs/Self Care Home Management;Cryotherapy;Electrical Stimulation;Iontophoresis '4mg'$ /ml Dexamethasone;Moist Heat;Ultrasound;Gait training;Stair training;Functional mobility training;Patient/family education;Neuromuscular re-education;Balance training;Therapeutic exercise;Therapeutic activities;Manual techniques;Passive range of motion;Vasopneumatic Device;Taping;Dry needling   PT Next Visit Plan soft tissue work to right quads  and ilitotibial band; joint mobilization to right knee, leg press, right ankle strength for HEP   PT Home Exercise  Plan progress as needed   Consulted and Agree with Plan of Care Patient      Patient will benefit from skilled therapeutic intervention in order to improve the following deficits and impairments:  Decreased strength, Increased edema, Pain, Decreased activity tolerance, Decreased endurance, Decreased range of motion, Difficulty walking  Visit Diagnosis: Chronic pain of right knee  Stiffness of right knee, not elsewhere classified  Other abnormalities of gait and mobility  Muscle weakness (generalized)     Problem List Patient Active Problem List   Diagnosis Date Noted  . Dyspareunia, female 11/13/2011  . Type 2 diabetes mellitus ([place]) 11/13/2011  . Alopecia 11/13/2011  . History of chicken pox   . Headache(784.0)   . Hypothyroidism   . Menses, irregular   . Hirsutism   . H/O fatigue   . Fibroid uterus     Earlie Counts, PT 10/21/16 12:32 PM   [facility] [address] [place], [place], [address] Phone: [phone]   Fax:  [phone]  Name: Krystal Lang MRN: [id number] Date of Birth: October 14, 1955  PHYSICAL THERAPY DISCHARGE SUMMARY  Visits from Start of Care: 2  Current functional level related to goals / functional outcomes: See above. Patient has not attended physical therapy since 10/21/2016.    Remaining deficits: See above.    Education / Equipment: HEP Plan:                                                    Patient goals were not met. Patient is being discharged due to not returning since the last visit.  Thank you for the referral. Earlie Counts, PT 01/29/17 2:47 PM  ?????

## 2016-10-21 NOTE — Patient Instructions (Addendum)
   Start in a supine position, one leg bent with foot flat on the bed/floor, and the other leg crossed over the knee.  Gently push the knee of the crossed leg forward until a strong yet pain-free stretch is felt.  Hold for 30 seconds then release.  Repeat as many sets as instructed, then switch sides and repeat.    1) Begin on your stomach with a strap (can use dog leash or towel) around your ankle 2) While maintaining your pelvis flat on the ground, begin to pull the strap to bring your heel closer to your buttocks 3) Continue to pull until a mild-moderate stretch is felt in the front part of your thigh (quadriceps) 4) Repeat for allotted number of repetitions and perform going in the opposite direction hold 30 sec 2 times 1 time per day.   [facility] [address], [place], [place] [address] Phone # [phone] Fax [phone]

## 2016-10-31 ENCOUNTER — Ambulatory Visit: Payer: BLUE CROSS/BLUE SHIELD | Admitting: Physical Therapy

## 2016-11-06 ENCOUNTER — Encounter: Payer: BLUE CROSS/BLUE SHIELD | Admitting: Physical Therapy

## 2016-11-10 ENCOUNTER — Ambulatory Visit (INDEPENDENT_AMBULATORY_CARE_PROVIDER_SITE_OTHER): Payer: Self-pay

## 2016-11-10 ENCOUNTER — Encounter (INDEPENDENT_AMBULATORY_CARE_PROVIDER_SITE_OTHER): Payer: Self-pay | Admitting: Orthopaedic Surgery

## 2016-11-10 ENCOUNTER — Ambulatory Visit (INDEPENDENT_AMBULATORY_CARE_PROVIDER_SITE_OTHER): Payer: BLUE CROSS/BLUE SHIELD | Admitting: Orthopaedic Surgery

## 2016-11-10 VITALS — BP 123/70 | HR 70 | Resp 14 | Ht 64.0 in | Wt 165.0 lb

## 2016-11-10 DIAGNOSIS — M25561 Pain in right knee: Secondary | ICD-10-CM | POA: Diagnosis not present

## 2016-11-10 DIAGNOSIS — G8929 Other chronic pain: Secondary | ICD-10-CM | POA: Diagnosis not present

## 2016-11-10 MED ORDER — BUPIVACAINE HCL 0.5 % IJ SOLN
3.0000 mL | INTRAMUSCULAR | Status: AC | PRN
  Administered 2016-11-10: 3 mL via INTRA_ARTICULAR

## 2016-11-10 MED ORDER — METHYLPREDNISOLONE ACETATE 40 MG/ML IJ SUSP
80.0000 mg | INTRAMUSCULAR | Status: AC | PRN
  Administered 2016-11-10: 80 mg

## 2016-11-10 MED ORDER — LIDOCAINE HCL 1 % IJ SOLN
5.0000 mL | INTRAMUSCULAR | Status: AC | PRN
  Administered 2016-11-10: 5 mL

## 2016-11-10 NOTE — Progress Notes (Signed)
Office Visit Note   Patient: Krystal Lang           Date of Birth: 02-04-1956           MRN: [id number] Visit Date: 11/10/2016              Requested by: Dixie Dials, MD [address] [place], [place] [address] PCP: Dixie Dials, MD   Assessment & Plan: Visit Diagnoses:  1. Chronic pain of right knee   Tricompartmental degenerative arthritis right knee  Plan: Long discussion regarding diagnosis and findings on x-ray from today. We'll proceed with a cortisone injection along the medial compartment and follow-up as needed. We have discussed Visco supplementation and knee replacement  Follow-Up Instructions: Return if symptoms worsen or fail to improve.   Orders:  Orders Placed This Encounter  Procedures  . XR KNEE 3 VIEW RIGHT   No orders of the defined types were placed in this encounter.     Procedures: Large Joint Inj Date/Time: 11/10/2016 2:25 PM Performed by: Garald Balding Authorized by: Garald Balding   Consent Given by:  Patient Timeout: prior to procedure the correct patient, procedure, and site was verified   Indications:  Pain and joint swelling Location:  Knee Site:  R knee Prep: patient was prepped and draped in usual sterile fashion   Needle Size:  25 G Needle Length:  1.5 inches Approach:  Anteromedial Ultrasound Guidance: No   Fluoroscopic Guidance: No   Arthrogram: No   Medications:  5 mL lidocaine 1 %; 80 mg methylPREDNISolone acetate 40 MG/ML; 3 mL bupivacaine 0.5 % Aspiration Attempted: No   Patient tolerance:  Patient tolerated the procedure well with no immediate complications     Clinical Data: No additional findings.   Subjective: Chief Complaint  Patient presents with  . Right Knee - Pain    Krystal Lang is a 61 y o that presents with Right knee pain. Having trouble walking down stairs x 3 months  Krystal Lang has had problem with both knees for well over a year. She's had cortisone injections in the past in  both knees that have provided temporary relief of pain. She's presently having more difficulty  in the right knee with swelling and some stiffness.. She also has had an issue with her back as previously identified. She did see Dr. 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 She seems to be improving with the pain along the lateral left thigh. MRI scan demonstrated a protruding disc at L5-S1 to the left with possible irritation of the L5 nerve  HPI  Review of Systems  Constitutional: Positive for fatigue. Negative for chills and fever.  Eyes: Negative for itching.  Respiratory: Negative for chest tightness and shortness of breath.   Cardiovascular: Negative for chest pain, palpitations and leg swelling.  Gastrointestinal: Negative for blood in stool, constipation and diarrhea.  Musculoskeletal: Positive for joint swelling. Negative for back pain, neck pain and neck stiffness.  Neurological: Positive for weakness. Negative for dizziness, numbness and headaches.  Hematological: Does not bruise/bleed easily.  Psychiatric/Behavioral: Negative for sleep disturbance. The patient is not nervous/anxious.      Objective: Vital Signs: BP 123/70   Pulse 70   Resp 14   Ht 5\' 4"  (1.626 m)   Wt 165 lb (74.8 kg)   BMI 28.32 kg/m   Physical Exam  Ortho Exam walks with a slight limp. Minimal effusion right knee. Predominantly medial joint pain  right knee. No popliteal pain. No swelling distally. Neurovascular exam intact. Skin intact. Lacks a few degrees to full extension probably based on the effusion. Flexed about 100 without instability  Specialty Comments:  No specialty comments available.  Imaging: Xr Knee 3 View Right  Result Date: 11/10/2016 Films of the right knee demonstrate moderate degenerative arthritis in all 3 compartments. There is about 2-3 of varus. Near bone-on-bone in the medial compartment with subchondral sclerosis and peripheral  osteophytes. Similar findings at the patellofemoral joint and less so laterally.    PMFS History: Patient Active Problem List   Diagnosis Date Noted  . Dyspareunia, female 11/13/2011  . Type 2 diabetes mellitus ([place]) 11/13/2011  . Alopecia 11/13/2011  . History of chicken pox   . Headache(784.0)   . Hypothyroidism   . Menses, irregular   . Hirsutism   . H/O fatigue   . Fibroid uterus    Past Medical History:  Diagnosis Date  . Diabetes mellitus   . Fibroid uterus 2001  . H/O fatigue 1998  . Headache(784.0)   . 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 History of chicken pox   . Hypothyroidism   . Menses, irregular 1998    Family History  Problem Relation Age of Onset  . Diabetes Mother   . Diabetes Father   . Heart disease Father        MI  . Diabetes Sister   . Diabetes Brother   . Heart disease Brother        Angioplasty    Past Surgical History:  Procedure Laterality Date  . DILATION AND CURETTAGE OF UTERUS  2002   In [place]  . KNEE SURGERY    . TONSILECTOMY, ADENOIDECTOMY, BILATERAL MYRINGOTOMY AND TUBES    . TONSILLECTOMY     Social History   Occupational History  . Not on file.   Social History Main Topics  . Smoking status: Never Smoker  . Smokeless tobacco: Never Used  . Alcohol use No  . Drug use: No  . Sexual activity: Yes    Birth control/ protection: Post-menopausal

## 2016-11-13 ENCOUNTER — Encounter: Payer: BLUE CROSS/BLUE SHIELD | Admitting: Physical Therapy

## 2016-11-18 ENCOUNTER — Encounter: Payer: BLUE CROSS/BLUE SHIELD | Admitting: Physical Therapy

## 2017-04-30 ENCOUNTER — Telehealth (INDEPENDENT_AMBULATORY_CARE_PROVIDER_SITE_OTHER): Payer: Self-pay | Admitting: Orthopaedic Surgery

## 2017-04-30 NOTE — Telephone Encounter (Signed)
Patient scheduled an appointment to see Dr. Durward Fortes on 2/11 to discuss possible knee surgery.  She would like to get the written results of her last x-ray that she had in the office before her appointment.

## 2017-05-04 NOTE — Telephone Encounter (Signed)
ok 

## 2017-05-04 NOTE — Telephone Encounter (Signed)
Please advise 

## 2017-05-05 NOTE — Telephone Encounter (Signed)
done

## 2017-05-11 ENCOUNTER — Encounter (INDEPENDENT_AMBULATORY_CARE_PROVIDER_SITE_OTHER): Payer: Self-pay | Admitting: Orthopaedic Surgery

## 2017-05-11 ENCOUNTER — Encounter (INDEPENDENT_AMBULATORY_CARE_PROVIDER_SITE_OTHER): Payer: Self-pay

## 2017-05-11 ENCOUNTER — Ambulatory Visit (INDEPENDENT_AMBULATORY_CARE_PROVIDER_SITE_OTHER): Payer: BLUE CROSS/BLUE SHIELD | Admitting: Orthopaedic Surgery

## 2017-05-11 VITALS — BP 149/104 | HR 69 | Resp 16 | Ht 63.0 in | Wt 165.0 lb

## 2017-05-11 DIAGNOSIS — M170 Bilateral primary osteoarthritis of knee: Secondary | ICD-10-CM | POA: Insufficient documentation

## 2017-05-11 MED ORDER — LIDOCAINE HCL 1 % IJ SOLN
2.0000 mL | INTRAMUSCULAR | Status: AC | PRN
  Administered 2017-05-11: 2 mL

## 2017-05-11 MED ORDER — METHYLPREDNISOLONE ACETATE 40 MG/ML IJ SUSP
80.0000 mg | INTRAMUSCULAR | Status: AC | PRN
  Administered 2017-05-11: 80 mg

## 2017-05-11 MED ORDER — BUPIVACAINE HCL 0.5 % IJ SOLN
2.0000 mL | INTRAMUSCULAR | Status: AC | PRN
Start: 2017-05-11 — End: 2017-05-11
  Administered 2017-05-11: 2 mL via INTRA_ARTICULAR

## 2017-05-11 NOTE — Progress Notes (Signed)
Office Visit Note   Patient: Krystal Lang           Date of Birth: [birth year]           MRN: [id number] Visit Date: 05/11/2017              Requested by: Dixie Dials, MD [address] [place], [place] [address] PCP: Dixie Dials, MD   Assessment & Plan: Visit Diagnoses:  1. Bilateral primary osteoarthritis of knee     Plan: Recurrent pain right knee related to the osteoarthritis. Cortisone injection in August made a big difference. Planning another trip to [place] and wants to feel "comfortable". We'll reinject the right knee with cortisone. Also had long discussion regarding knee replacement  Follow-Up Instructions: Return if symptoms worsen or fail to improve.   Orders:  Orders Placed This Encounter  Procedures  . Large Joint Inj: R knee   No orders of the defined types were placed in this encounter.     Procedures: Large Joint Inj: R knee on 05/11/2017 10:12 AM Indications: pain and diagnostic evaluation Details: 25 G 1.5 in needle, anteromedial approach  Arthrogram: No  Medications: 2 mL lidocaine 1 %; 2 mL bupivacaine 0.5 %; 80 mg methylPREDNISolone acetate 40 MG/ML Procedure, treatment alternatives, risks and benefits explained, specific risks discussed. Consent was given by the patient. Immediately prior to procedure a time out was called to verify the correct patient, procedure, equipment, support staff and site/side marked as required. Patient was prepped and draped in the usual sterile fashion.       Clinical Data: No additional findings.   Subjective: Chief Complaint  Patient presents with  . Right Knee - Pain    Krystal Lang is a 62 y o here today for chronic Right knee pain years. Hx of Right knee scope. She is going to [place] in the next days (10) for knee manipulation.She does have calf pain, no fevers, chills. Pain posterior knee, diabetic  Has been previously diagnosed with osteoarthritis in both knees with significant degenerative  changes predominantly in the medial compartments. Has had cortisone injection of both knees within the past year with good results. Planning a trip to [place] in the next 1-2 weeks and wants to have an injection in her right more symptomatic knee. Also wanted to discuss knee replacement surgery  HPI  Review of Systems  Constitutional: Positive for fatigue. Negative for chills and fever.  HENT: Negative for hearing loss and tinnitus.   Eyes: Negative for itching.  Respiratory: Negative for chest tightness and shortness of breath.   Cardiovascular: Negative for chest pain, palpitations and leg swelling.  Gastrointestinal: Negative for blood in stool, constipation and diarrhea.  Endocrine: Negative for polyuria.  Genitourinary: Negative for dysuria.  Musculoskeletal: Positive for back pain. Negative for joint swelling, neck pain and neck stiffness.  Allergic/Immunologic: Negative for immunocompromised state.  Neurological: Positive for dizziness. Negative for numbness and headaches.  Hematological: Does not bruise/bleed easily.  Psychiatric/Behavioral: Positive for sleep disturbance. The patient is not nervous/anxious.      Objective: Vital Signs: BP (!) 149/104   Pulse 69   Resp 16   Ht 5\' 3"  (1.6 m)   Wt 165 lb (74.8 kg)   BMI 29.23 kg/m   Physical Exam  Ortho Exam right knee without effusion. The knee was not hot warm or red. Mild patellar crepitation. Increased varus with weightbearing. Some popliteal fullness but no pain. No calf pain or swelling. Good capillary refill to toes.  Full extension and flexion over 100 without instability. Straight leg raise negative. Painless range of motion both hips  Specialty Comments:  No specialty comments available.  Imaging: No results found.   PMFS History: Patient Active Problem List   Diagnosis Date Noted  . Bilateral primary osteoarthritis of knee 05/11/2017  . Dyspareunia, female 11/13/2011  . Type 2 diabetes mellitus ([place])  11/13/2011  . Alopecia 11/13/2011  . History of chicken pox   . Headache(784.0)   . Hypothyroidism   . Menses, irregular   . Hirsutism   . H/O fatigue   . Fibroid uterus    Past Medical History:  Diagnosis Date  . Diabetes mellitus   . Fibroid uterus 2001  . H/O fatigue 1998  . Headache(784.0)   . 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 History of chicken pox   . Hypothyroidism   . Menses, irregular 1998    Family History  Problem Relation Age of Onset  . Diabetes Mother   . Diabetes Father   . Heart disease Father        MI  . Diabetes Sister   . Diabetes Brother   . Heart disease Brother        Angioplasty    Past Surgical History:  Procedure Laterality Date  . DILATION AND CURETTAGE OF UTERUS  2002   In [place]  . KNEE SURGERY    . TONSILECTOMY, ADENOIDECTOMY, BILATERAL MYRINGOTOMY AND TUBES    . TONSILLECTOMY     Social History   Occupational History  . Not on file  Tobacco Use  . Smoking status: Never Smoker  . Smokeless tobacco: Never Used  Substance and Sexual Activity  . Alcohol use: No  . Drug use: No  . Sexual activity: Yes    Birth control/protection: Post-menopausal

## 2017-10-05 ENCOUNTER — Ambulatory Visit (INDEPENDENT_AMBULATORY_CARE_PROVIDER_SITE_OTHER): Payer: BLUE CROSS/BLUE SHIELD | Admitting: Orthopaedic Surgery

## 2017-10-13 ENCOUNTER — Ambulatory Visit (INDEPENDENT_AMBULATORY_CARE_PROVIDER_SITE_OTHER): Payer: BLUE CROSS/BLUE SHIELD | Admitting: Orthopaedic Surgery

## 2017-10-13 ENCOUNTER — Encounter (INDEPENDENT_AMBULATORY_CARE_PROVIDER_SITE_OTHER): Payer: Self-pay | Admitting: Orthopaedic Surgery

## 2017-10-13 ENCOUNTER — Other Ambulatory Visit (INDEPENDENT_AMBULATORY_CARE_PROVIDER_SITE_OTHER): Payer: Self-pay | Admitting: Radiology

## 2017-10-13 ENCOUNTER — Telehealth (INDEPENDENT_AMBULATORY_CARE_PROVIDER_SITE_OTHER): Payer: Self-pay | Admitting: Radiology

## 2017-10-13 VITALS — BP 127/76 | HR 94 | Ht 64.0 in | Wt 175.0 lb

## 2017-10-13 DIAGNOSIS — M25561 Pain in right knee: Principal | ICD-10-CM

## 2017-10-13 DIAGNOSIS — G8929 Other chronic pain: Secondary | ICD-10-CM

## 2017-10-13 DIAGNOSIS — M170 Bilateral primary osteoarthritis of knee: Secondary | ICD-10-CM | POA: Diagnosis not present

## 2017-10-13 NOTE — Telephone Encounter (Signed)
PLEASE APPLY FOR VISCO SUPP  FOR R KNEE

## 2017-10-13 NOTE — Progress Notes (Signed)
Office Visit Note   Patient: Krystal Lang           Date of Birth: 15-Apr-1955           MRN: [id number] Visit Date: 10/13/2017              Requested by: Dixie Dials, MD [address] [place], [place] [address] PCP: Dixie Dials, MD   Assessment & Plan: Visit Diagnoses:  1. Bilateral primary osteoarthritis of knee     Plan: Pre-certify for Visco supplementation.  Physical therapy for bilateral lower extremity strengthening.  Long discussion over 30 minutes regarding the osteoarthritis of both of her knees and different treatment options including knee replacement  Follow-Up Instructions: Return precert visco.   Orders:  No orders of the defined types were placed in this encounter.  No orders of the defined types were placed in this encounter.     Procedures: No procedures performed   Clinical Data: No additional findings.   Subjective: Chief Complaint  Patient presents with  . Follow-up    r knee pain for several yrs. had injection in feb. unable to straighten knee out, harder to drive, dose have shooting pain down to the foot. now symptoms are worse and l knee is starting to hurt  Krystal Lang is accompanied by her husband and here for follow-up evaluation of the osteoarthritis of her right knee.  Films of her knee last year demonstrated significant tricompartmental degenerative changes predominantly in the medial compartment associated with about 2 degrees of varus.  She is had several cortisone injections that have made a difference but do "elevate her sugars.  She is diabetic.  Last injection in February lasted "about 2 months".  She wanted to discuss different treatment options.  She is aware of the knee potential for knee replacement surgery.  She is tried anti-inflammatory medicines in the past but it is concerned about the potential side effects.  She also is concerned about potential complications with surgery.  She is having more trouble now on the  right than the left associated with some decreased range of motion and pain with sitting for long no back pain  HPI  Review of Systems  Constitutional: Positive for fatigue. Negative for fever.  HENT: Negative for ear pain.   Eyes: Negative for pain.  Respiratory: Negative for cough and shortness of breath.   Cardiovascular: Positive for leg swelling.  Gastrointestinal: Negative for constipation and diarrhea.  Genitourinary: Negative for difficulty urinating.  Musculoskeletal: Negative for back pain and neck pain.  Skin: Negative for rash.  Allergic/Immunologic: Negative for food allergies.  Neurological: Positive for weakness. Negative for numbness.  Hematological: Does not bruise/bleed easily.  Psychiatric/Behavioral: Positive for sleep disturbance.     Objective: Vital Signs: BP 127/76 (BP Location: Left Arm, Patient Position: Sitting, Cuff Size: Normal)   Pulse 94   Ht 5\' 4"  (1.626 m)   Wt 175 lb (79.4 kg)   BMI 30.04 kg/m   Physical Exam  Constitutional: She is oriented to person, place, and time. She appears well-developed and well-nourished.  HENT:  Mouth/Throat: Oropharynx is clear and moist.  Eyes: Pupils are equal, round, and reactive to light. EOM are normal.  Pulmonary/Chest: Effort normal.  Neurological: She is alert and oriented to person, place, and time.  Skin: Skin is warm and dry.  Psychiatric: She has a normal mood and affect. Her behavior is normal.    Ortho Exam awake alert and oriented x3.  Comfortable sitting.  Right knee without effusion.  Full extension only about 90 degrees to 95 degrees of flexion.  No instability.  Predominately medial joint pain.  Some patellar crepitation.  No calf pain.  No distal edema.  Good pulses.  Right leg raise negative.  Painless range of motion both hips Specialty Comments:  No specialty comments available.  Imaging: No results found.   PMFS History: Patient Active Problem List   Diagnosis Date Noted  .  Bilateral primary osteoarthritis of knee 05/11/2017  . Dyspareunia, female 11/13/2011  . Type 2 diabetes mellitus ([place]) 11/13/2011  . Alopecia 11/13/2011  . History of chicken pox   . Headache(784.0)   . Hypothyroidism   . Menses, irregular   . Hirsutism   . H/O fatigue   . Fibroid uterus    Past Medical History:  Diagnosis Date  . Diabetes mellitus   . Fibroid uterus 2001  . H/O fatigue 1998  . Headache(784.0)   . 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 History of chicken pox   . Hypothyroidism   . Menses, irregular 1998    Family History  Problem Relation Age of Onset  . Diabetes Mother   . Diabetes Father   . Heart disease Father        MI  . Diabetes Sister   . Diabetes Brother   . Heart disease Brother        Angioplasty    Past Surgical History:  Procedure Laterality Date  . DILATION AND CURETTAGE OF UTERUS  2002   In [place]  . KNEE SURGERY    . TONSILECTOMY, ADENOIDECTOMY, BILATERAL MYRINGOTOMY AND TUBES    . TONSILLECTOMY     Social History   Occupational History  . Not on file  Tobacco Use  . Smoking status: Never Smoker  . Smokeless tobacco: Never Used  Substance and Sexual Activity  . Alcohol use: No  . Drug use: No  . Sexual activity: Yes    Birth control/protection: Post-menopausal

## 2017-10-13 NOTE — Telephone Encounter (Signed)
Noted  

## 2017-10-15 ENCOUNTER — Telehealth (INDEPENDENT_AMBULATORY_CARE_PROVIDER_SITE_OTHER): Payer: Self-pay

## 2017-10-15 NOTE — Telephone Encounter (Signed)
Submitted application online for SynviscOne, right knee. 

## 2017-10-27 ENCOUNTER — Telehealth (INDEPENDENT_AMBULATORY_CARE_PROVIDER_SITE_OTHER): Payer: Self-pay

## 2017-10-27 NOTE — Telephone Encounter (Signed)
PA required for SynviscOne, right knee. Faxed completed PA form to [employer] at [phone]. 

## 2017-10-30 ENCOUNTER — Telehealth (INDEPENDENT_AMBULATORY_CARE_PROVIDER_SITE_OTHER): Payer: Self-pay

## 2017-10-30 NOTE — Telephone Encounter (Signed)
LMOM for patient to call and schedule her SynviscOne injection.

## 2017-10-30 NOTE — Telephone Encounter (Signed)
Can you please schedule this patient an appointment with Dr. Durward Fortes?  Patient approved for SynviscOne, right knee injection. [employer] Covered at 100% after a $30.00 co-pay PA Approval# is [id number] Valid 10/27/2017- 10/27/2018 Thank you.

## 2017-11-09 ENCOUNTER — Ambulatory Visit (INDEPENDENT_AMBULATORY_CARE_PROVIDER_SITE_OTHER): Payer: BLUE CROSS/BLUE SHIELD | Admitting: Orthopaedic Surgery

## 2017-11-09 ENCOUNTER — Encounter (INDEPENDENT_AMBULATORY_CARE_PROVIDER_SITE_OTHER): Payer: Self-pay | Admitting: Orthopaedic Surgery

## 2017-11-09 DIAGNOSIS — M1711 Unilateral primary osteoarthritis, right knee: Secondary | ICD-10-CM | POA: Diagnosis not present

## 2017-11-09 DIAGNOSIS — M170 Bilateral primary osteoarthritis of knee: Secondary | ICD-10-CM

## 2017-11-09 MED ORDER — HYLAN G-F 20 48 MG/6ML IX SOSY
48.0000 mg | PREFILLED_SYRINGE | INTRA_ARTICULAR | Status: AC | PRN
  Administered 2017-11-09: 48 mg via INTRA_ARTICULAR

## 2017-11-09 NOTE — Progress Notes (Signed)
Office Visit Note   Patient: Krystal Lang           Date of Birth: April 04, 1955           MRN: [id number] Visit Date: 11/09/2017              Requested by: Dixie Dials, MD [address] [place], [place] [address] PCP: Dixie Dials, MD   Assessment & Plan: Visit Diagnoses:  1. Bilateral primary osteoarthritis of knee     Plan: Synvisc 1 injection right knee.  Follow-up as needed  Follow-Up Instructions: Return if symptoms worsen or fail to improve.   Orders:  Orders Placed This Encounter  Procedures  . Large Joint Inj: R knee   No orders of the defined types were placed in this encounter.     Procedures: Large Joint Inj: R knee on 11/09/2017 1:57 PM Indications: pain and diagnostic evaluation Details: 25 G 1.5 in needle, anteromedial approach  Arthrogram: No  Medications: 48 mg Hylan 48 MG/6ML Outcome: tolerated well, no immediate complications Procedure, treatment alternatives, risks and benefits explained, specific risks discussed. Consent was given by the patient. Immediately prior to procedure a time out was called to verify the correct patient, procedure, equipment, support staff and site/side marked as required. Patient was prepped and draped in the usual sterile fashion.       Clinical Data: No additional findings.   Subjective: No chief complaint on file. Krystal Lang has been approved for Synvisc 1 injection.  This will be performed today right knee  HPI  Review of Systems  Constitutional: Positive for fatigue. Negative for fever.  HENT: Negative for ear pain.   Eyes: Negative for pain.  Respiratory: Negative for cough and shortness of breath.   Cardiovascular: Positive for leg swelling.  Gastrointestinal: Negative for constipation and diarrhea.  Genitourinary: Negative for difficulty urinating.  Musculoskeletal: Negative for neck pain.  Skin: Negative for rash.  Allergic/Immunologic: Negative for food allergies.  Neurological:  Positive for weakness.  Hematological: Does not bruise/bleed easily.  Psychiatric/Behavioral: Positive for sleep disturbance.     Objective: Vital Signs: There were no vitals taken for this visit.  Physical Exam  Ortho Exam right knee without effusion increased warmth or heat.  Minimal medial joint pain.  Specialty Comments:  No specialty comments available.  Imaging: No results found.   PMFS History: Patient Active Problem List   Diagnosis Date Noted  . Bilateral primary osteoarthritis of knee 05/11/2017  . Dyspareunia, female 11/13/2011  . Type 2 diabetes mellitus ([place]) 11/13/2011  . Alopecia 11/13/2011  . History of chicken pox   . Headache(784.0)   . Hypothyroidism   . Menses, irregular   . Hirsutism   . H/O fatigue   . Fibroid uterus    Past Medical History:  Diagnosis Date  . Diabetes mellitus   . Fibroid uterus 2001  . H/O fatigue 1998  . Headache(784.0)   . 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 History of chicken pox   . Hypothyroidism   . Menses, irregular 1998    Family History  Problem Relation Age of Onset  . Diabetes Mother   . Diabetes Father   . Heart disease Father        MI  . Diabetes Sister   . Diabetes Brother   . Heart disease Brother        Angioplasty    Past Surgical History:  Procedure Laterality Date  . [place] OF UTERUS  2002  In [place]  . KNEE SURGERY    . TONSILECTOMY, ADENOIDECTOMY, BILATERAL MYRINGOTOMY AND TUBES    . TONSILLECTOMY     Social History   Occupational History  . Not on file  Tobacco Use  . Smoking status: Never Smoker  . Smokeless tobacco: Never Used  Substance and Sexual Activity  . Alcohol use: No  . Drug use: No  . Sexual activity: Yes    Birth control/protection: Post-menopausal

## 2017-11-25 IMAGING — MR MR LUMBAR SPINE W/O CM
4 of 5 series · 24 of 48 positions shown · non-contrast
Comparison: Lumbar spine radiograph July 07, 2016

CLINICAL DATA: Low back sprain radiating to LEFT anterior thigh for
1 month. Symptoms improved after prednisone and NSAIDs.

EXAM:
MRI LUMBAR SPINE WITHOUT CONTRAST
TECHNIQUE: Multiplanar, multisequence MR imaging of the lumbar spine was
performed. No intravenous contrast was administered.

[Series 4: T1 · sagittal · 4.0mm · 0.55mm/px · 6 of 13 slices shown (1 of 2)]
[im 1/13]
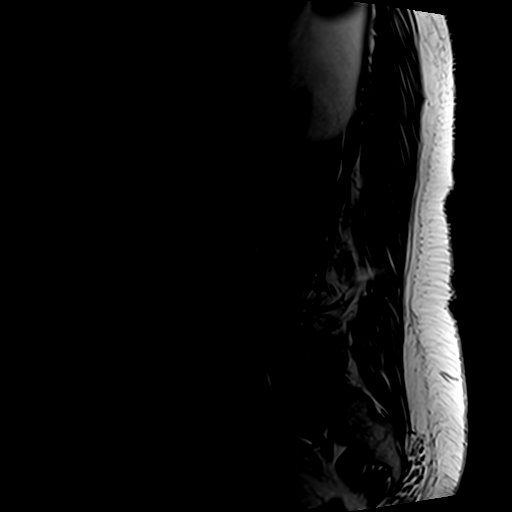
[im 3/13]
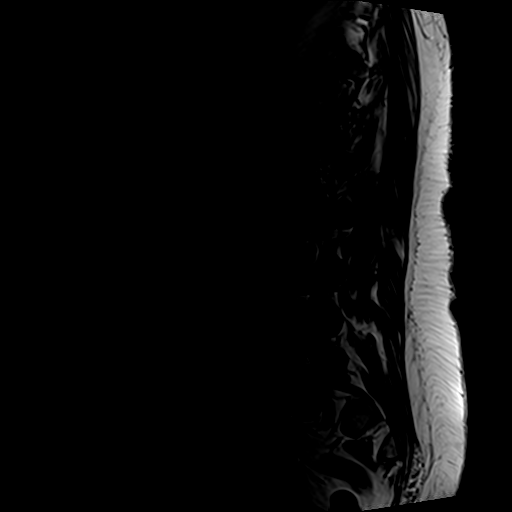
[im 5/13]
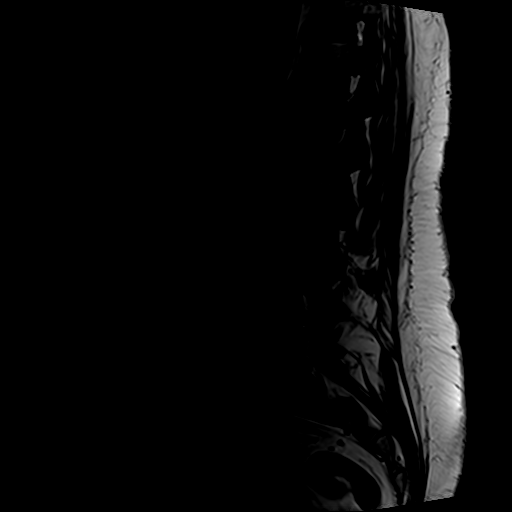
[im 8/13]
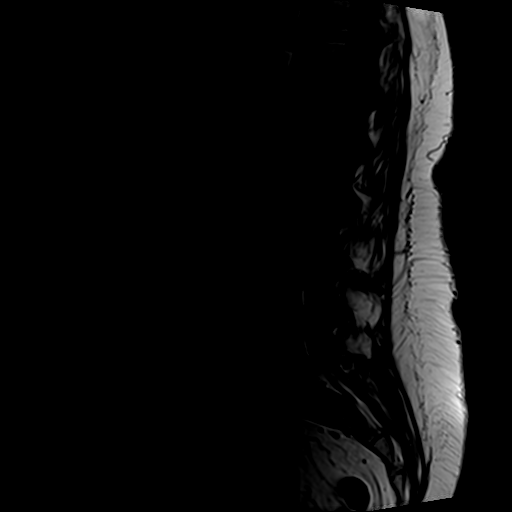
[im 10/13]
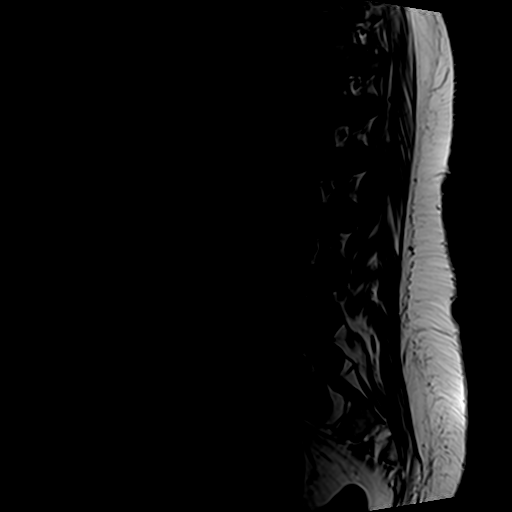
[im 13/13]
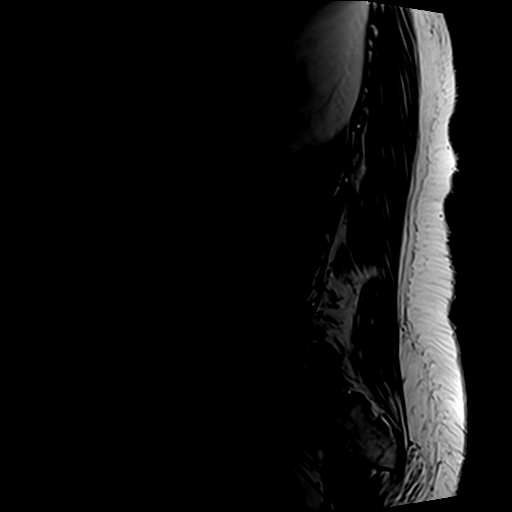

[Series 5: T2 · sagittal · 4.0mm · 0.55mm/px · 6 of 13 slices shown (1 of 2)]
[im 1/13]
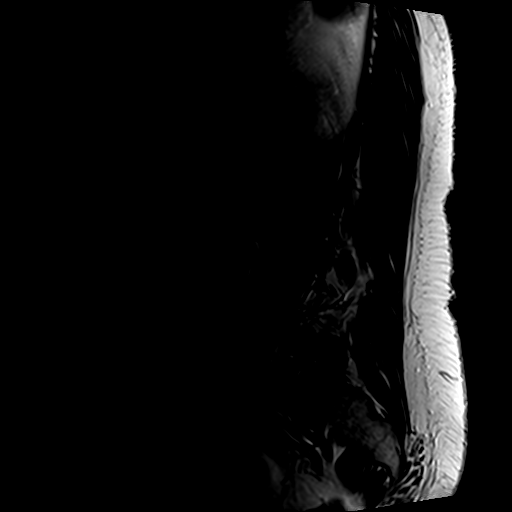
[im 3/13]
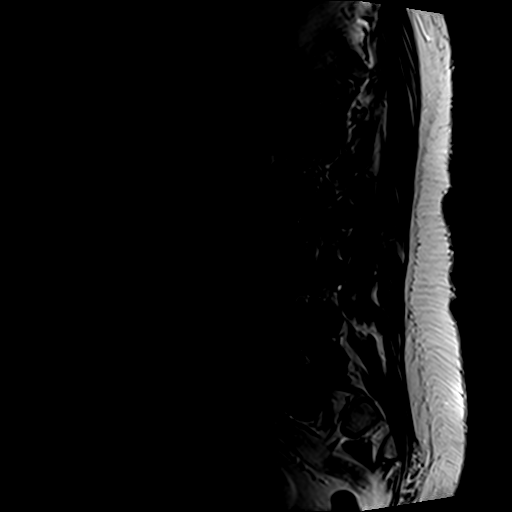
[im 5/13]
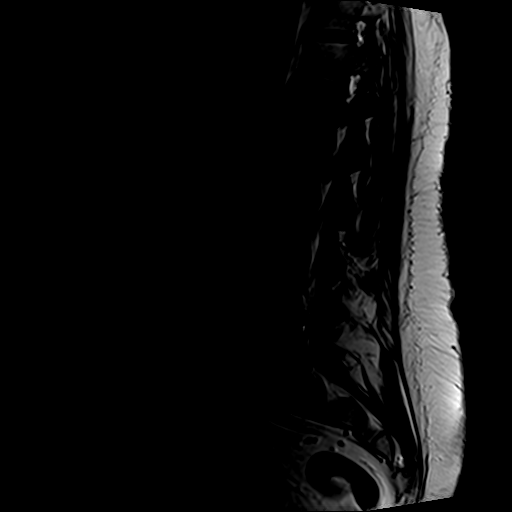
[im 8/13]
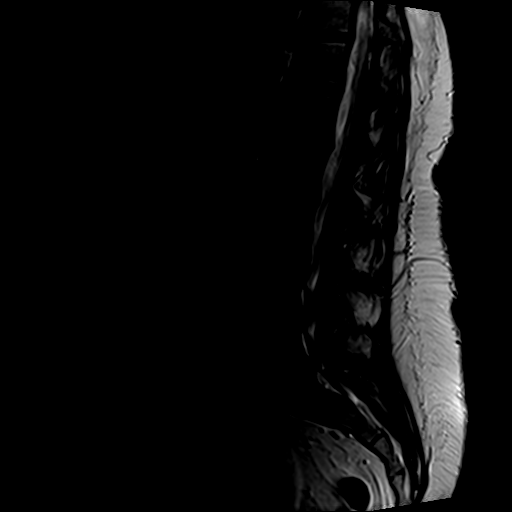
[im 10/13]
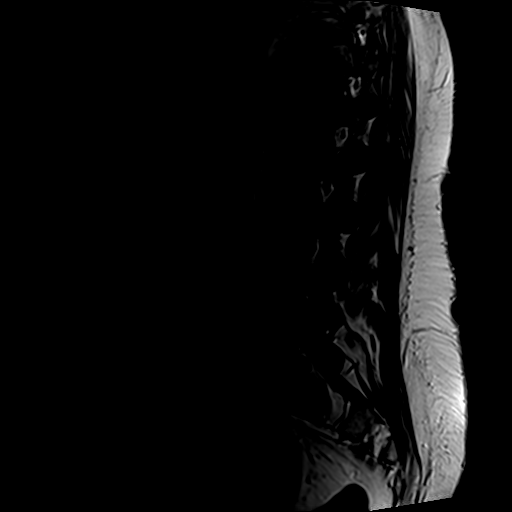
[im 13/13]
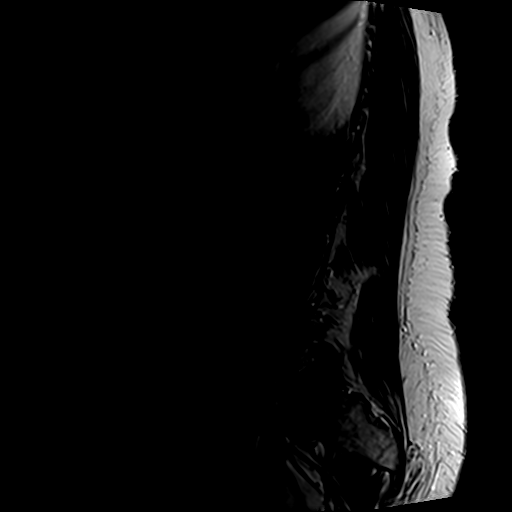

[Series 6: T2 · axial · 4.0mm · 0.70mm/px · z∈[-76,+96]mm · 9 of 32 slices shown (2 of 2)]
[im 1/32]
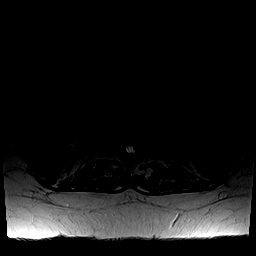
[im 5/32]
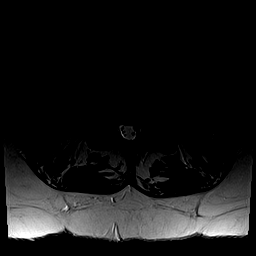
[im 9/32]
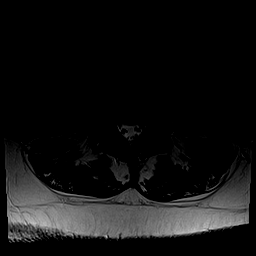
[im 14/32]
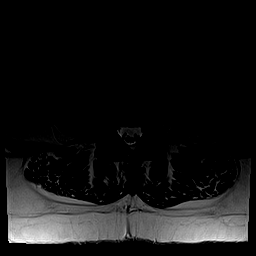
[im 16/32]
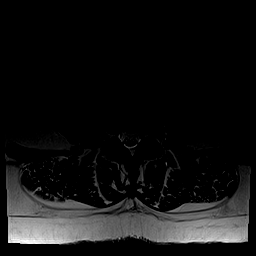
[im 18/32]
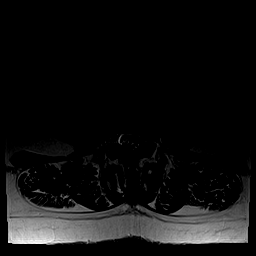
[im 23/32]
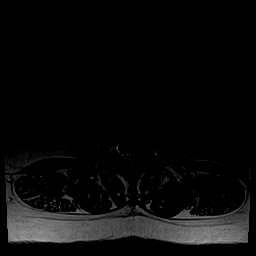
[im 27/32]
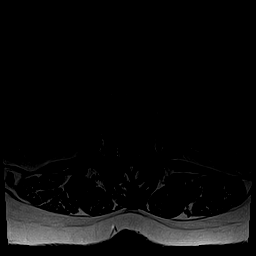
[im 32/32]
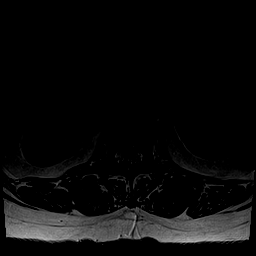

[Series 7: T1 · axial · 4.0mm · 0.35mm/px · z∈[-57,+73]mm · 3 of 32 slices shown (2 of 2)]
[im 5/32]
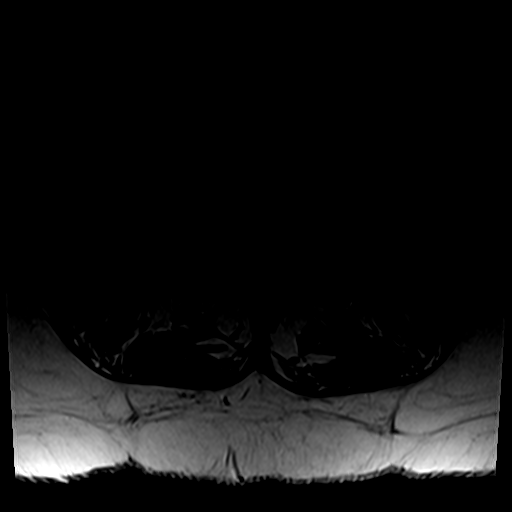
[im 16/32]
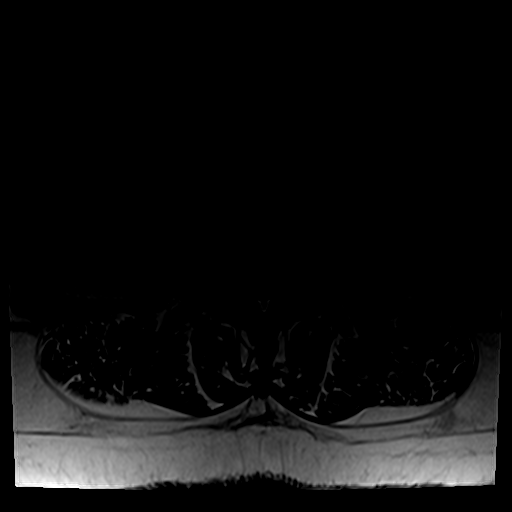
[im 27/32]
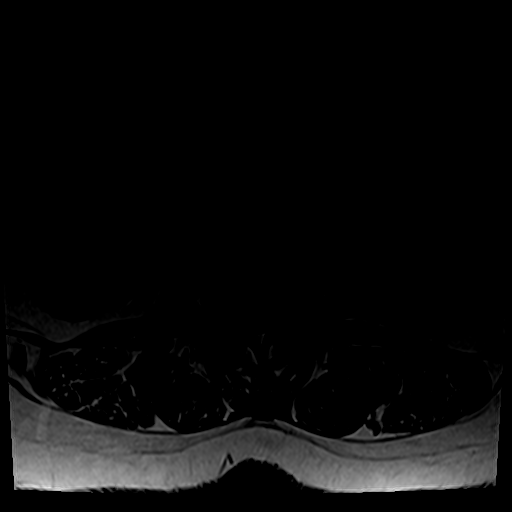

[24 of 48 positions shown; findings below may reference images not displayed]

FINDINGS: SEGMENTATION: For the purposes of this report, the last well-formed
intervertebral disc will be described as L5-S1.

ALIGNMENT: Maintenance of the lumbar lordosis. No malalignment.

VERTEBRAE:Vertebral bodies are intact. Severe L4-5 disc height loss,
mild at L1-2. Decreased T2 signal within the discs compatible with
mild desiccation with multilevel mild subacute on chronic discogenic
endplate changes. No suspicious bone marrow signal nor acute osseous
process. Scattered old Schmorl's nodes.

CONUS MEDULLARIS: Conus medullaris terminates at T12-L1 and
demonstrates normal morphology and signal characteristics. Cauda
equina is normal.

PARASPINAL AND SOFT TISSUES: Included prevertebral and paraspinal
soft tissues are normal.

DISC LEVELS:

L1-2: Small broad-based disc bulge without canal stenosis or neural
foraminal narrowing.

L2-3: Small broad-based disc bulge. Mild facet arthropathy and
ligamentum flavum redundancy without canal stenosis or neural
foraminal narrowing.

L3-4: Small broad-based disc bulge and tiny undersurface central
disc protrusion versus extrusion without migration. Mild facet
arthropathy and ligamentum flavum redundancy without canal stenosis
or neural foraminal narrowing.

L4-5: 4 mm broad-based disc bulge eccentric laterally may affect the
exited L4 nerves. Mild facet arthropathy and ligamentum flavum
redundancy without canal stenosis. Mild bilateral neural foraminal
narrowing.

L5-S1: Small broad-based disc bulge, undersurface annular fissure.
Small LEFT extraforaminal disc protrusion anterior to the exited
LEFT L5 nerve may be acute considering bright STIR signal. Moderate
RIGHT, moderate to severe LEFT facet arthropathy without canal
stenosis. Minimal RIGHT, mild LEFT neural foraminal narrowing.
IMPRESSION: Small LEFT extraforaminal L5-S1 disc protrusion may be acute,
anterior to the exited LEFT L5 nerve.

Degenerative change of lumbar spine without canal stenosis. Minimal
to mild L4-5 and L5-S1 neural foraminal narrowing.

L5-S1 annular fissure.

## 2018-05-28 ENCOUNTER — Other Ambulatory Visit: Payer: Self-pay | Admitting: Cardiovascular Disease

## 2018-05-28 DIAGNOSIS — Z1231 Encounter for screening mammogram for malignant neoplasm of breast: Secondary | ICD-10-CM

## 2018-06-01 ENCOUNTER — Other Ambulatory Visit: Payer: Self-pay | Admitting: Dermatology

## 2018-06-23 ENCOUNTER — Ambulatory Visit: Payer: BLUE CROSS/BLUE SHIELD

## 2018-07-21 ENCOUNTER — Ambulatory Visit: Payer: BLUE CROSS/BLUE SHIELD

## 2018-07-23 ENCOUNTER — Telehealth (INDEPENDENT_AMBULATORY_CARE_PROVIDER_SITE_OTHER): Payer: Self-pay | Admitting: Orthopaedic Surgery

## 2018-07-23 NOTE — Telephone Encounter (Signed)
Patient called stating she had SynviscOne gel injection in her right knee last August and is checking if Dr. Durward Fortes could order another injection or if he needs her to schedule an appointment.  Patient states her insurance has changed, but was not able to locate her card.  Patient states she will call back next week with insurance information.

## 2018-09-16 ENCOUNTER — Ambulatory Visit: Payer: BLUE CROSS/BLUE SHIELD

## 2019-01-05 ENCOUNTER — Encounter: Payer: Self-pay | Admitting: *Deleted

## 2019-07-28 ENCOUNTER — Other Ambulatory Visit: Payer: Self-pay | Admitting: *Deleted

## 2019-07-28 DIAGNOSIS — N644 Mastodynia: Secondary | ICD-10-CM

## 2019-08-04 ENCOUNTER — Ambulatory Visit (INDEPENDENT_AMBULATORY_CARE_PROVIDER_SITE_OTHER): Payer: Medicare Other

## 2019-08-04 ENCOUNTER — Ambulatory Visit (INDEPENDENT_AMBULATORY_CARE_PROVIDER_SITE_OTHER): Payer: Medicare Other | Admitting: Orthopaedic Surgery

## 2019-08-04 ENCOUNTER — Ambulatory Visit: Payer: Self-pay

## 2019-08-04 ENCOUNTER — Encounter: Payer: Self-pay | Admitting: Orthopaedic Surgery

## 2019-08-04 ENCOUNTER — Other Ambulatory Visit: Payer: Self-pay

## 2019-08-04 VITALS — Ht 64.0 in | Wt 170.0 lb

## 2019-08-04 DIAGNOSIS — M170 Bilateral primary osteoarthritis of knee: Secondary | ICD-10-CM

## 2019-08-04 DIAGNOSIS — M25562 Pain in left knee: Secondary | ICD-10-CM

## 2019-08-04 DIAGNOSIS — M25561 Pain in right knee: Secondary | ICD-10-CM

## 2019-08-04 DIAGNOSIS — G8929 Other chronic pain: Secondary | ICD-10-CM

## 2019-08-04 NOTE — Progress Notes (Signed)
Office Visit Note   Patient: Krystal Lang           Date of Birth: [birth year]           MRN: [id number] Visit Date: 08/04/2019              Requested by: Dixie Dials, MD [address] [place],  [place] [address] PCP: Dixie Dials, MD   Assessment & Plan: Visit Diagnoses:  1. Right knee pain, unspecified chronicity   2. Chronic pain of both knees   3. Bilateral primary osteoarthritis of knee     Plan: Progressive osteoarthritis both knees but particularly on the right.  Long discussion with Krystal Lang regarding the above.  She has been having a lot of pain along the lateral portion of both of her knees.  She is convinced that she has had some issue with ligaments and I think it is worth obtaining an MRI scan.  We have discussed knee replacement surgery to some extent and we will discuss that further if appropriate  Follow-Up Instructions: Return After MRI scan right knee.   Orders:  Orders Placed This Encounter  Procedures  . XR KNEE 3 VIEW LEFT  . XR KNEE 3 VIEW RIGHT  . MR Knee Right w/o contrast   No orders of the defined types were placed in this encounter.     Procedures: No procedures performed   Clinical Data: No additional findings.   Subjective: Chief Complaint  Patient presents with  . Right Knee - Pain  . Left Knee - Pain  Patient presents today for constant chronic bilateral knee pain. She said that her right knee is worse than the left. She was last here in 2019 and received a cortisone injection in the right knee. She said that it helped a little while. She said that her pain is constant, but worsens with weightbearing. She does not take anything for pain. No swelling.  Has had some pain along the posterior lateral corner of the right greater than left knee and some lateral leg pain.  No numbness or tingling.  No edema.  Prior films in 2018 revealed advanced osteoarthritis along the medial compartment of both of her knees.  Had an MRI scan of  her right knee in [place] in 2019 but does not have the results or a copy of the report  HPI  Review of Systems   Objective: Vital Signs: Ht 5\' 4"  (1.626 m)   Wt 170 lb (77.1 kg)   BMI 29.18 kg/m   Physical Exam Constitutional:      Appearance: She is well-developed.  Eyes:     Pupils: Pupils are equal, round, and reactive to light.  Pulmonary:     Effort: Pulmonary effort is normal.  Skin:    General: Skin is warm and dry.  Neurological:     Mental Status: She is alert and oriented to person, place, and time.  Psychiatric:        Behavior: Behavior normal.     Ortho Exam awake alert and oriented x3.  Comfortable sitting.  Full extension in both of her knees but increased varus as expected given the x-rays with medial compartment arthritis.  Some osteophytes palpated on the medial compartment of both knees.  Some patellar crepitation.  Some discomfort along the posterior lateral corner of both of her knees more so on the right than the left but no masses.  Might have some popliteal fullness on the right consistent with a  popliteal cyst.  No calf pain or swelling.  Motor exam intact.  No distal edema.  No evidence of instability.  Some discomfort along the hamstring tendons laterally on the right  Specialty Comments:  No specialty comments available.  Imaging: XR KNEE 3 VIEW LEFT  Result Date: 08/04/2019 Films of the left knee obtained in 3 projections standing.  There are degenerative changes in all 3 compartments to a slightly lesser degree than on the right.  There is about 2 or 3 degrees of varus with peripheral osteophytes in the medial compartment with subchondral sclerosis.  Lesser changes in the lateral compartment and the patellofemoral joint.  No acute changes with subchondral sclerosis.  Films are consistent with moderate to advanced osteoarthritis  XR KNEE 3 VIEW RIGHT  Result Date: 08/04/2019 Films of the right knee were obtained in 3 projections standing.  These were  compared to the films performed in 2018.  There is mild progression of the tricompartmental degenerative arthritis particularly in the medial compartment where there is near bone-on-bone with subchondral sclerosis on both sides of the joint and peripheral osteophytes.  No ectopic calcification or acute changes.  There are  degenerative changes at the patellofemoral joint and the lateral compartment as well.    PMFS History: Patient Active Problem List   Diagnosis Date Noted  . Bilateral primary osteoarthritis of knee 05/11/2017  . Dyspareunia, female 11/13/2011  . Type 2 diabetes mellitus ([place]) 11/13/2011  . Alopecia 11/13/2011  . History of chicken pox   . Headache(784.0)   . Hypothyroidism   . Menses, irregular   . Hirsutism   . H/O fatigue   . Fibroid uterus    Past Medical History:  Diagnosis Date  . Diabetes mellitus   . Fibroid uterus 2001  . H/O fatigue 1998  . Headache(784.0)   . 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 History of chicken pox   . Hypothyroidism   . Menses, irregular 1998    Family History  Problem Relation Age of Onset  . Diabetes Mother   . Diabetes Father   . Heart disease Father        MI  . Diabetes Sister   . Diabetes Brother   . Heart disease Brother        Angioplasty    Past Surgical History:  Procedure Laterality Date  . DILATION AND CURETTAGE OF UTERUS  2002   In [place]  . KNEE SURGERY    . TONSILECTOMY, ADENOIDECTOMY, BILATERAL MYRINGOTOMY AND TUBES    . TONSILLECTOMY     Social History   Occupational History  . Not on file  Tobacco Use  . Smoking status: Never Smoker  . Smokeless tobacco: Never Used  Substance and Sexual Activity  . Alcohol use: No  . Drug use: No  . Sexual activity: Yes    Birth control/protection: Post-menopausal

## 2019-08-10 ENCOUNTER — Ambulatory Visit: Payer: Self-pay | Attending: Internal Medicine

## 2019-08-10 DIAGNOSIS — Z20822 Contact with and (suspected) exposure to covid-19: Secondary | ICD-10-CM

## 2019-08-11 LAB — SARS-COV-2, NAA 2 DAY TAT

## 2019-08-11 LAB — NOVEL CORONAVIRUS, NAA: SARS-CoV-2, NAA: NOT DETECTED

## 2019-09-03 ENCOUNTER — Other Ambulatory Visit: Payer: Self-pay

## 2019-09-03 ENCOUNTER — Ambulatory Visit
Admission: RE | Admit: 2019-09-03 | Discharge: 2019-09-03 | Disposition: A | Discharge status: Home / Self Care | Payer: Medicare Other | Source: Ambulatory Visit | Attending: Orthopaedic Surgery | Admitting: Orthopaedic Surgery

## 2019-09-03 DIAGNOSIS — M25561 Pain in right knee: Secondary | ICD-10-CM

## 2019-09-13 ENCOUNTER — Ambulatory Visit: Payer: Self-pay | Admitting: Orthopaedic Surgery

## 2019-09-20 ENCOUNTER — Ambulatory Visit: Payer: Self-pay | Admitting: Orthopaedic Surgery

## 2019-09-21 ENCOUNTER — Ambulatory Visit (INDEPENDENT_AMBULATORY_CARE_PROVIDER_SITE_OTHER): Payer: Medicare Other | Admitting: Orthopaedic Surgery

## 2019-09-21 ENCOUNTER — Telehealth: Payer: Self-pay | Admitting: Radiology

## 2019-09-21 ENCOUNTER — Other Ambulatory Visit: Payer: Self-pay

## 2019-09-21 ENCOUNTER — Encounter: Payer: Self-pay | Admitting: Orthopaedic Surgery

## 2019-09-21 DIAGNOSIS — M25562 Pain in left knee: Secondary | ICD-10-CM

## 2019-09-21 DIAGNOSIS — M25561 Pain in right knee: Secondary | ICD-10-CM | POA: Diagnosis not present

## 2019-09-21 DIAGNOSIS — M170 Bilateral primary osteoarthritis of knee: Secondary | ICD-10-CM

## 2019-09-21 DIAGNOSIS — G8929 Other chronic pain: Secondary | ICD-10-CM

## 2019-09-21 DIAGNOSIS — M1711 Unilateral primary osteoarthritis, right knee: Secondary | ICD-10-CM

## 2019-09-21 MED ORDER — BUPIVACAINE HCL 0.5 % IJ SOLN
2.0000 mL | INTRAMUSCULAR | Status: AC | PRN
  Administered 2019-09-21: 2 mL via INTRA_ARTICULAR

## 2019-09-21 MED ORDER — METHYLPREDNISOLONE ACETATE 40 MG/ML IJ SUSP
80.0000 mg | INTRAMUSCULAR | Status: AC | PRN
  Administered 2019-09-21: 80 mg via INTRA_ARTICULAR

## 2019-09-21 MED ORDER — LIDOCAINE HCL 1 % IJ SOLN
2.0000 mL | INTRAMUSCULAR | Status: AC | PRN
  Administered 2019-09-21: 2 mL

## 2019-09-21 NOTE — Progress Notes (Signed)
Office Visit Note   Patient: Krystal Lang           Date of Birth: [birth year]           MRN: [id number] Visit Date: 09/21/2019              Requested by: Dixie Dials, MD [address] [place],  [place] [address] PCP: Dixie Dials, MD   Assessment & Plan: Visit Diagnoses:  1. Chronic pain of both knees   2. Right knee pain, unspecified chronicity   3. Bilateral primary osteoarthritis of knee     Plan: Krystal Lang had an MRI scan of her right knee.  She is accompanied by her husband.  I reviewed the scan with him both.  She has evidence of significant tricompartmental arthritis predominantly in the medial compartment where there are areas of complete cartilage loss.  There is also a radial tear of the medial meniscus posteriorly.  She is not having any pain in that area.  I believe the entire cause of her pain is related to her arthritis.  We had a long discussion over 30 minutes regarding her diagnosis and potential treatment options.  We decided to try a course of physical therapy and inject her knee with cortisone.  We will pre-CERT the viscosupplementation and try a course of physical therapy.  At some point she may be a candidate for knee replacement. This patient is diagnosed with osteoarthritis of the knee(s).    Radiographs show evidence of joint space narrowing, osteophytes, subchondral sclerosis and/or subchondral cysts.  This patient has knee pain which interferes with functional and activities of daily living.    This patient has experienced inadequate response, adverse effects and/or intolerance with conservative treatments such as acetaminophen, NSAIDS, topical creams, physical therapy or regular exercise, knee bracing and/or weight loss.   This patient has experienced inadequate response or has a contraindication to intra articular steroid injections for at least 3 months.   This patient is not scheduled to have a total knee replacement within 6 months of  starting treatment with viscosupplementation.   Follow-Up Instructions: Return in about 1 month (around 10/21/2019).   Orders:  Orders Placed This Encounter  Procedures  . Ambulatory referral to Physical Therapy   No orders of the defined types were placed in this encounter.     Procedures: Large Joint Inj: R knee on 09/21/2019 2:38 PM Indications: pain and diagnostic evaluation Details: 25 G 1.5 in needle, anteromedial approach  Arthrogram: No  Medications: 2 mL lidocaine 1 %; 2 mL bupivacaine 0.5 %; 80 mg methylPREDNISolone acetate 40 MG/ML Procedure, treatment alternatives, risks and benefits explained, specific risks discussed. Consent was given by the patient. Immediately prior to procedure a time out was called to verify the correct patient, procedure, equipment, support staff and site/side marked as required. Patient was prepped and draped in the usual sterile fashion.       Clinical Data: No additional findings.   Subjective: Chief Complaint  Patient presents with  . Right Knee - Follow-up    MRI review, no changes in symptoms  Krystal Lang an MRI scan of her right knee.  His symptoms have not changed.  She is having predominately lateral joint pain of the right knee.  HPI  Review of Systems   Objective: Vital Signs: There were no vitals taken for this visit.  Physical Exam Constitutional:      Appearance: She is well-developed.  Eyes:     Pupils: Pupils  are equal, round, and reactive to light.  Pulmonary:     Effort: Pulmonary effort is normal.  Skin:    General: Skin is warm and dry.  Neurological:     Mental Status: She is alert and oriented to person, place, and time.  Psychiatric:        Behavior: Behavior normal.     Ortho Exam awake alert and oriented x3.  Comfortable sitting.  Right knee without an effusion.  It was not hot red warm or swollen.  Slight varus with weightbearing.  No medial joint pain but palpable osteophytes.  Minimal  patellar crepitation.  Having some lateral joint pain but no instability.  Flexed over 100 degrees.  Just a little bit of fullness in the popliteal space but no pain.  No calf discomfort.  Good pulses distally without any swelling good good capillary refill to the toes.  Painless range of motion both hips Specialty Comments:  No specialty comments available.  Imaging: No results found.   PMFS History: Patient Active Problem List   Diagnosis Date Noted  . Bilateral primary osteoarthritis of knee 05/11/2017  . Dyspareunia, female 11/13/2011  . Type 2 diabetes mellitus ([place]) 11/13/2011  . Alopecia 11/13/2011  . History of chicken pox   . Headache(784.0)   . Hypothyroidism   . Menses, irregular   . Hirsutism   . H/O fatigue   . Fibroid uterus    Past Medical History:  Diagnosis Date  . Diabetes mellitus   . Fibroid uterus 2001  . H/O fatigue 1998  . Headache(784.0)   . 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 History of chicken pox   . Hypothyroidism   . Menses, irregular 1998    Family History  Problem Relation Age of Onset  . Diabetes Mother   . Diabetes Father   . Heart disease Father        MI  . Diabetes Sister   . Diabetes Brother   . Heart disease Brother        Angioplasty    Past Surgical History:  Procedure Laterality Date  . DILATION AND CURETTAGE OF UTERUS  2002   In [place]  . KNEE SURGERY    . TONSILECTOMY, ADENOIDECTOMY, BILATERAL MYRINGOTOMY AND TUBES    . TONSILLECTOMY     Social History   Occupational History  . Not on file  Tobacco Use  . Smoking status: Never Smoker  . Smokeless tobacco: Never Used  Vaping Use  . Vaping Use: Never used  Substance and Sexual Activity  . Alcohol use: No  . Drug use: No  . Sexual activity: Yes    Birth control/protection: Post-menopausal     Garald Balding, MD   Note - This record has been created using Editor, commissioning.  Chart creation errors have been sought, but may not always  have been located. Such  creation errors do not reflect on  the standard of medical care.

## 2019-09-21 NOTE — Telephone Encounter (Signed)
Please get approval for visco injection of right knee

## 2019-09-22 NOTE — Telephone Encounter (Signed)
Noted  

## 2019-09-23 ENCOUNTER — Ambulatory Visit (INDEPENDENT_AMBULATORY_CARE_PROVIDER_SITE_OTHER): Payer: Medicare Other | Admitting: Physical Therapy

## 2019-09-23 ENCOUNTER — Encounter: Payer: Self-pay | Admitting: Physical Therapy

## 2019-09-23 ENCOUNTER — Other Ambulatory Visit: Payer: Self-pay

## 2019-09-23 DIAGNOSIS — R2689 Other abnormalities of gait and mobility: Secondary | ICD-10-CM

## 2019-09-23 DIAGNOSIS — M25561 Pain in right knee: Secondary | ICD-10-CM | POA: Diagnosis not present

## 2019-09-23 DIAGNOSIS — M6281 Muscle weakness (generalized): Secondary | ICD-10-CM | POA: Diagnosis not present

## 2019-09-23 DIAGNOSIS — M25661 Stiffness of right knee, not elsewhere classified: Secondary | ICD-10-CM

## 2019-09-23 DIAGNOSIS — G8929 Other chronic pain: Secondary | ICD-10-CM

## 2019-09-23 NOTE — Patient Instructions (Signed)
Access Code: [email or web address] URL: [email or web address] Date: 09/23/2019 Prepared by: Faustino Congress  Exercises Seated Hamstring Stretch - 2 x daily - 7 x weekly - 3 reps - 1 sets - 30 sec hold Gastroc Stretch on Wall - 2 x daily - 7 x weekly - 3 reps - 1 sets - 30 sec hold Prone Quadriceps Stretch with Strap - 2 x daily - 7 x weekly - 3 reps - 1 sets - 30 sec hold

## 2019-09-23 NOTE — Therapy (Signed)
[facility] Physical Therapy [address] [place], [place], [address] Phone: [phone]   Fax:  [phone]  Physical Therapy Evaluation  Patient Details  Name: Krystal Lang MRN: [id number] Date of Birth: 02/24/1956 Referring Provider (PT): Joni Fears, MD   Encounter Date: 09/23/2019   PT End of Session - 09/23/19 0930    Visit Number 1    Number of Visits 12    Date for PT Re-Evaluation 11/04/19    PT Start Time 0845    PT Stop Time 0925    PT Time Calculation (min) 40 min    Activity Tolerance Patient tolerated treatment well    Behavior During Therapy [facility] for tasks assessed/performed           Past Medical History:  Diagnosis Date  . Diabetes mellitus   . Fibroid uterus 2001  . H/O fatigue 1998  . Headache(784.0)   . 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 History of chicken pox   . Hypothyroidism   . Menses, irregular 1998    Past Surgical History:  Procedure Laterality Date  . DILATION AND CURETTAGE OF UTERUS  2002   In [place]  . KNEE SURGERY    . TONSILECTOMY, ADENOIDECTOMY, BILATERAL MYRINGOTOMY AND TUBES    . TONSILLECTOMY      There were no vitals filed for this visit.    Subjective Assessment - 09/23/19 0848    Subjective Pt is a 64 y/o female who presents to OPPT for Rt knee pain.  Pt reports initial injury in 2008 with meniscal injury s/p knee scope.  Pt had special tx in [place] following surgery and reported 80-90% improvement.  Pt then had a fall a few years ago, and now has significant OA in Rt knee.  She is trying to avoid surgery if able.    Pertinent History DM, Headaches    Limitations Standing    How long can you stand comfortably? couple of hours    Diagnostic tests MRI: significant tricompartmental arthritis predominantly in the medial compartment where there are areas of complete cartilage loss.  There is also a radial tear of the medial meniscus posteriorly.    Patient Stated Goals improve motion in knees, improve  strength    Currently in Pain? Yes    Pain Score 0-No pain   would not rate pain intensity   Pain Location Knee    Pain Orientation Right    Pain Descriptors / Indicators Sharp    Pain Type Chronic pain    Pain Onset More than a month ago    Pain Frequency Intermittent    Aggravating Factors  standing after sitting > 10 min, standing in one position, bending knee    Pain Relieving Factors walking              [facility] PT Assessment - 09/23/19 0856      Assessment   Medical Diagnosis Rt knee pain/OA    Referring Provider (PT) Joni Fears, MD    Onset Date/Surgical Date --   chronic x >10 years   Hand Dominance Right    Next MD Visit 10/20/19    Prior Therapy 3-4 years ago - same reason      Precautions   Precautions None      Restrictions   Weight Bearing Restrictions No      Balance Screen   Has the patient fallen in the past 6 months No    Has the patient had a decrease in activity level  because of a fear of falling?  Yes    Is the patient reluctant to leave their home because of a fear of falling?  No      Home Environment   Living Environment Private residence    Living Arrangements Spouse/significant other    Type of [place] to enter    Entrance Stairs-Number of Steps 5    Entrance Stairs-Rails None    Home Layout Multi-level;Bed/bath upstairs    Additional Comments pain with descending stairs      Prior Function   Level of Independence Independent    Vocation Full time employment    Administrator - seated computer work, occasional walking    Leisure watch movies, walking - not regularly      Cognition   Overall Cognitive Status Within Functional Limits for tasks assessed      ROM / Strength   AROM / PROM / Strength AROM;Strength;PROM      AROM   AROM Assessment Site Knee    Right/Left Knee Right;Left    Right Knee Extension -4    Right Knee Flexion 98    Left Knee Extension 0    Left Knee Flexion  127      PROM   PROM Assessment Site Knee    Right/Left Knee Right    Right Knee Extension 0    Right Knee Flexion 100      Strength   Overall Strength Comments hip abduction and extenion weakness present with functional activities    Strength Assessment Site Hip;Knee    Right/Left Hip Right;Left    Right Hip Flexion 4/5    Left Hip Flexion 5/5    Right/Left Knee Right;Left    Right Knee Flexion 4/5    Right Knee Extension 4/5    Left Knee Flexion 5/5    Left Knee Extension 5/5      Flexibility   Soft Tissue Assessment /Muscle Length yes   Rt gastroc tightness noted   Hamstrings tightness on Rt    Quadriceps tightness on Rt      Palpation   Patella mobility hypomobility noted on Rt      Ambulation/Gait   Gait Pattern Antalgic                      Objective measurements completed on examination: See above findings.       [facility] Adult PT Treatment/Exercise - 09/23/19 0856      Exercises   Exercises Knee/Hip      Knee/Hip Exercises: Stretches   Passive Hamstring Stretch Right;1 rep;20 seconds    Quad Stretch Right;1 rep;20 seconds    Gastroc Stretch Right;1 rep;20 seconds                  PT Education - 09/23/19 0924    Education Details HEP    Person(s) Educated Patient    Methods Explanation;Demonstration;Handout    Comprehension Verbalized understanding;Returned demonstration;Need further instruction            PT Short Term Goals - 09/23/19 0951      PT SHORT TERM GOAL #1   Title be independent in initial HEP    Status New    Target Date 10/07/19             PT Long Term Goals - 09/23/19 0951      PT LONG TERM GOAL #1   Title be independent in  advanced HEP    Status New    Target Date 11/04/19      PT LONG TERM GOAL #2   Title report pain decrease by 50% with transitional movements for improved function    Status New    Target Date 11/04/19      PT LONG TERM GOAL #3   Title improve strength and decrease pain to  descend steps with step-over-step gait > or = to 50% of the time    Status New    Target Date 11/04/19      PT LONG TERM GOAL #4   Title n/a      PT LONG TERM GOAL #5   Title n/a                  Plan - 09/23/19 0931    Clinical Impression Statement Pt is a 64 y/o female who presents to OPPT for Rt knee pain with known OA.  Pt improved today following cortisone injection, and still demonstrates decreased strength and ROM as well as decreased flexibility and gait abnormalities.  Pt will benefit from PT to address deficits listed.    Personal Factors and Comorbidities Comorbidity 1    Comorbidities DM    Examination-Activity Limitations Lift;Stand;Locomotion Level;Sit;Squat;Stairs    Examination-Participation Restrictions Meal Prep;Cleaning;Community Activity    Stability/Clinical Decision Making Stable/Uncomplicated    Clinical Decision Making Low    Rehab Potential Good    PT Frequency 2x / week    PT Duration 6 weeks    PT Treatment/Interventions ADLs/Self Care Home Management;Cryotherapy;Electrical Stimulation;Ultrasound;Moist Heat;Gait training;Stair training;Functional mobility training;Therapeutic activities;Therapeutic exercise;Balance training;Neuromuscular re-education;Patient/family education;Manual techniques;Taping;Dry needling;Passive range of motion    PT Next Visit Plan review HEP, add strengthening to HEP, gym program    PT Home Exercise Plan Access Code: [email or web address]    Consulted and Agree with Plan of Care Patient           Patient will benefit from skilled therapeutic intervention in order to improve the following deficits and impairments:  Abnormal gait, Pain, Decreased mobility, Decreased range of motion, Decreased strength, Impaired flexibility, Difficulty walking  Visit Diagnosis: Chronic pain of right knee - Plan: PT plan of care cert/re-cert  Stiffness of right knee, not elsewhere classified - Plan: PT plan of care cert/re-cert  Other abnormalities  of gait and mobility - Plan: PT plan of care cert/re-cert  Muscle weakness (generalized) - Plan: PT plan of care cert/re-cert     Problem List Patient Active Problem List   Diagnosis Date Noted  . Bilateral primary osteoarthritis of knee 05/11/2017  . Dyspareunia, female 11/13/2011  . Type 2 diabetes mellitus ([place]) 11/13/2011  . Alopecia 11/13/2011  . History of chicken pox   . Headache(784.0)   . Hypothyroidism   . Menses, irregular   . Hirsutism   . H/O fatigue   . Fibroid uterus       Laureen Abrahams, PT, DPT 09/23/19 9:59 AM    [facility] Physical Therapy [address] [place], [place], [address] Phone: [phone]   Fax:  [phone]  Name: Krystal Lang MRN: [id number] Date of Birth: Nov 29, 1955

## 2019-09-27 ENCOUNTER — Other Ambulatory Visit: Payer: Self-pay

## 2019-09-27 ENCOUNTER — Ambulatory Visit (INDEPENDENT_AMBULATORY_CARE_PROVIDER_SITE_OTHER): Payer: Medicare Other | Admitting: Rehabilitative and Restorative Service Providers"

## 2019-09-27 ENCOUNTER — Encounter: Payer: Self-pay | Admitting: Rehabilitative and Restorative Service Providers"

## 2019-09-27 DIAGNOSIS — M25561 Pain in right knee: Secondary | ICD-10-CM

## 2019-09-27 DIAGNOSIS — M6281 Muscle weakness (generalized): Secondary | ICD-10-CM | POA: Diagnosis not present

## 2019-09-27 DIAGNOSIS — M25661 Stiffness of right knee, not elsewhere classified: Secondary | ICD-10-CM | POA: Diagnosis not present

## 2019-09-27 DIAGNOSIS — G8929 Other chronic pain: Secondary | ICD-10-CM

## 2019-09-27 DIAGNOSIS — R2689 Other abnormalities of gait and mobility: Secondary | ICD-10-CM | POA: Diagnosis not present

## 2019-09-27 NOTE — Therapy (Addendum)
[facility] Physical Therapy [address] [place], [place], [address] Phone: [phone]   Fax:  [phone]  Physical Therapy Treatment  Patient Details  Name: Krystal Lang MRN: [id number] Date of Birth: 12-Nov-1955 Referring Provider (PT): Joni Fears, MD   Encounter Date: 09/27/2019   PT End of Session - 09/27/19 1434    Visit Number 2    Number of Visits 12    Date for PT Re-Evaluation 11/04/19    PT Start Time 1540    PT Stop Time 1515    PT Time Calculation (min) 42 min    Activity Tolerance Patient tolerated treatment well    Behavior During Therapy [facility] for tasks assessed/performed           Past Medical History:  Diagnosis Date  . Diabetes mellitus   . Fibroid uterus 2001  . H/O fatigue 1998  . Headache(784.0)   . 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 History of chicken pox   . Hypothyroidism   . Menses, irregular 1998    Past Surgical History:  Procedure Laterality Date  . DILATION AND CURETTAGE OF UTERUS  2002   In [place]  . KNEE SURGERY    . TONSILECTOMY, ADENOIDECTOMY, BILATERAL MYRINGOTOMY AND TUBES    . TONSILLECTOMY      There were no vitals filed for this visit.   Subjective Assessment - 09/27/19 1449    Subjective Pt. reported 3-4/10 pain or so upon arrival today.  Feeling some better.    Pertinent History DM, Headaches    Limitations Standing    How long can you stand comfortably? couple of hours    Diagnostic tests MRI: significant tricompartmental arthritis predominantly in the medial compartment where there are areas of complete cartilage loss.  There is also a radial tear of the medial meniscus posteriorly.    Patient Stated Goals improve motion in knees, improve strength    Currently in Pain? Yes    Pain Location Knee    Pain Orientation Right    Pain Descriptors / Indicators Sharp    Pain Type Chronic pain    Pain Onset More than a month ago    Pain Frequency Occasional    Aggravating Factors  prolonged  walking hill    Pain Relieving Factors injection                             [facility] Adult PT Treatment/Exercise - 09/27/19 0001      Knee/Hip Exercises: Stretches   Passive Hamstring Stretch Right;5 reps   15   Gastroc Stretch Both;3 reps;30 seconds      Knee/Hip Exercises: Aerobic   Recumbent Bike partial rev 3 mins c instruction for gym use    Nustep lvl 5 10 mins      Knee/Hip Exercises: Machines for Strengthening   Total Gym Leg Press DL leg press 75 lbs 3 x 10      Knee/Hip Exercises: Seated   Other Seated Knee/Hip Exercises seated SLR 2 x 10 Rt LE c cues      Manual Therapy   Manual therapy comments Distraction, IR, flexion of Rt LE for ROM                    PT Short Term Goals - 09/27/19 1516      PT SHORT TERM GOAL #1   Title be independent in initial HEP    Status On-going  Target Date 10/07/19             PT Long Term Goals - 09/27/19 1520      PT LONG TERM GOAL #1   Title be independent in advanced HEP    Status On-going    Target Date 11/04/19      PT LONG TERM GOAL #2   Title report pain decrease by 50% with transitional movements for improved function    Status On-going    Target Date 11/04/19      PT LONG TERM GOAL #3   Title improve strength and decrease pain to descend steps with step-over-step gait > or = to 50% of the time    Status On-going    Target Date 11/04/19      PT LONG TERM GOAL #4   Title n/a      PT LONG TERM GOAL #5   Title n/a                 Plan - 09/27/19 1521    Clinical Impression Statement Main complaints in movement in flexion at this time.  Extensive Pt. education given on difference between muscle soreness and pain complaints and to adjust activities that create moderate to severe pain to avoid exacerbation of symptoms.  Reviewed HEP c good knowledge overall.  Continued skilled PT services indicated at this time.    Personal Factors and Comorbidities Comorbidity 1     Comorbidities DM    Examination-Activity Limitations Lift;Stand;Locomotion Level;Sit;Squat;Stairs    Examination-Participation Restrictions Meal Prep;Cleaning;Community Activity    Stability/Clinical Decision Making Stable/Uncomplicated    Rehab Potential Good    PT Frequency 2x / week    PT Duration 6 weeks    PT Treatment/Interventions ADLs/Self Care Home Management;Cryotherapy;Electrical Stimulation;Ultrasound;Moist Heat;Gait training;Stair training;Functional mobility training;Therapeutic activities;Therapeutic exercise;Balance training;Neuromuscular re-education;Patient/family education;Manual techniques;Taping;Dry needling;Passive range of motion    PT Next Visit Plan Continue to improve knee flexion mobility, strengthening/balance.    PT Home Exercise Plan Access Code: [email or web address]    Consulted and Agree with Plan of Care Patient           Patient will benefit from skilled therapeutic intervention in order to improve the following deficits and impairments:  Abnormal gait, Pain, Decreased mobility, Decreased range of motion, Decreased strength, Impaired flexibility, Difficulty walking  Visit Diagnosis: Chronic pain of right knee  Stiffness of right knee, not elsewhere classified  Other abnormalities of gait and mobility  Muscle weakness (generalized)     Problem List Patient Active Problem List   Diagnosis Date Noted  . Bilateral primary osteoarthritis of knee 05/11/2017  . Dyspareunia, female 11/13/2011  . Type 2 diabetes mellitus ([place]) 11/13/2011  . Alopecia 11/13/2011  . History of chicken pox   . Headache(784.0)   . Hypothyroidism   . Menses, irregular   . Hirsutism   . H/O fatigue   . Fibroid uterus    Scot Jun, PT, DPT, OCS, ATC 09/27/19  3:24 PM   Addendum to correct billed time in there ex error.  Scot Jun, PT, DPT, OCS, ATC 10/07/19  3:06 PM       [facility] [address] [place], [place],  [address] Phone: [phone]   Fax:  [phone]  Name: Krystal Lang MRN: [id number] Date of Birth: 07/31/1955

## 2019-10-05 ENCOUNTER — Telehealth: Payer: Self-pay

## 2019-10-05 NOTE — Telephone Encounter (Signed)
Submitted VOB, [employer], right knee. 

## 2019-10-07 ENCOUNTER — Telehealth: Payer: Self-pay

## 2019-10-07 ENCOUNTER — Encounter: Payer: Self-pay | Admitting: Rehabilitative and Restorative Service Providers"

## 2019-10-07 ENCOUNTER — Other Ambulatory Visit: Payer: Self-pay

## 2019-10-07 ENCOUNTER — Ambulatory Visit (INDEPENDENT_AMBULATORY_CARE_PROVIDER_SITE_OTHER): Payer: Medicare Other | Admitting: Rehabilitative and Restorative Service Providers"

## 2019-10-07 DIAGNOSIS — M6281 Muscle weakness (generalized): Secondary | ICD-10-CM

## 2019-10-07 DIAGNOSIS — M25561 Pain in right knee: Secondary | ICD-10-CM

## 2019-10-07 DIAGNOSIS — M25661 Stiffness of right knee, not elsewhere classified: Secondary | ICD-10-CM | POA: Diagnosis not present

## 2019-10-07 DIAGNOSIS — R2689 Other abnormalities of gait and mobility: Secondary | ICD-10-CM

## 2019-10-07 DIAGNOSIS — G8929 Other chronic pain: Secondary | ICD-10-CM

## 2019-10-07 NOTE — Telephone Encounter (Signed)
Approved, [employer], right knee. Buy & Bill Covered at 100% through secondary insurance Scientist, clinical (histocompatibility and immunogenetics)) after [employer] pays. No Co-pay  No PA required

## 2019-10-07 NOTE — Therapy (Signed)
[facility] [address] [place], [place], [address] Phone: [phone]   Fax:  [phone]  Physical Therapy Treatment  Patient Details  Name: Krystal Lang MRN: [id number] Date of Birth: 23-Oct-1955 Referring Provider (PT): Joni Fears, MD   Encounter Date: 10/07/2019   PT End of Session - 10/07/19 1438    Visit Number 3    Number of Visits 12    Date for PT Re-Evaluation 11/04/19    PT Start Time 1430    PT Stop Time 1510    PT Time Calculation (min) 40 min    Activity Tolerance Patient tolerated treatment well    Behavior During Therapy [facility] for tasks assessed/performed           Past Medical History:  Diagnosis Date  . Diabetes mellitus   . Fibroid uterus 2001  . H/O fatigue 1998  . Headache(784.0)   . 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 History of chicken pox   . Hypothyroidism   . Menses, irregular 1998    Past Surgical History:  Procedure Laterality Date  . DILATION AND CURETTAGE OF UTERUS  2002   In [place]  . KNEE SURGERY    . TONSILECTOMY, ADENOIDECTOMY, BILATERAL MYRINGOTOMY AND TUBES    . TONSILLECTOMY      There were no vitals filed for this visit.   Subjective Assessment - 10/07/19 1434    Subjective Pt. stated injection seems to be wearing off and pain noted in medial knee at 6/10 at times.  Pt. indicated she was getting annoyed with the visit alerts at times on her phone.    Pertinent History DM, Headaches    Limitations Standing    How long can you stand comfortably? couple of hours    Diagnostic tests MRI: significant tricompartmental arthritis predominantly in the medial compartment where there are areas of complete cartilage loss.  There is also a radial tear of the medial meniscus posteriorly.    Patient Stated Goals improve motion in knees, improve strength    Pain Score 6     Pain Location Knee    Pain Orientation Right    Pain Descriptors / Indicators Sharp    Pain Type Chronic pain    Pain Onset  More than a month ago    Pain Frequency Intermittent    Aggravating Factors  stairs, bending at times    Pain Relieving Factors injection (but wearing off)                             [facility] Adult PT Treatment/Exercise - 10/07/19 0001      Neuro Re-ed    Neuro Re-ed Details  SLS 15 sec x 6 bilateral, tandem stance 30 sec x 4 bilateral      Knee/Hip Exercises: Stretches   Active Hamstring Stretch --   seated    Gastroc Stretch Both;5 reps;30 seconds   incline board     Knee/Hip Exercises: Aerobic   Nustep Lvl 5 6 mins      Knee/Hip Exercises: Machines for Strengthening   Total Gym Leg Press SL leg press Rt LE 31 lbs 3 x 10       Knee/Hip Exercises: Standing   Lateral Step Up 10 reps;Step Height: 6";Right   eccentric focus   Forward Step Up Step Height: 6";20 reps;Right      Knee/Hip Exercises: Seated   Other Seated Knee/Hip Exercises seated SLR 2 x 10 Rt  LE c cues                    PT Short Term Goals - 09/27/19 1516      PT SHORT TERM GOAL #1   Title be independent in initial HEP    Status On-going    Target Date 10/07/19             PT Long Term Goals - 09/27/19 1520      PT LONG TERM GOAL #1   Title be independent in advanced HEP    Status On-going    Target Date 11/04/19      PT LONG TERM GOAL #2   Title report pain decrease by 50% with transitional movements for improved function    Status On-going    Target Date 11/04/19      PT LONG TERM GOAL #3   Title improve strength and decrease pain to descend steps with step-over-step gait > or = to 50% of the time    Status On-going    Target Date 11/04/19      PT LONG TERM GOAL #4   Title n/a      PT LONG TERM GOAL #5   Title n/a                 Plan - 10/07/19 1457    Clinical Impression Statement Pt. demonstrated improved tolerance to step activity.  Still reported symptoms c end range flexion overall at this time as well as at home symptoms c going down stairs.   Continued strengthening indicated as well as improved balance    Personal Factors and Comorbidities Comorbidity 1    Comorbidities DM    Examination-Activity Limitations Lift;Stand;Locomotion Level;Sit;Squat;Stairs    Examination-Participation Restrictions Meal Prep;Cleaning;Community Activity    Stability/Clinical Decision Making Stable/Uncomplicated    Rehab Potential Good    PT Frequency 2x / week    PT Duration 6 weeks    PT Treatment/Interventions ADLs/Self Care Home Management;Cryotherapy;Electrical Stimulation;Ultrasound;Moist Heat;Gait training;Stair training;Functional mobility training;Therapeutic activities;Therapeutic exercise;Balance training;Neuromuscular re-education;Patient/family education;Manual techniques;Taping;Dry needling;Passive range of motion    PT Next Visit Plan Balance, strength gains.  Posterior LE mobility improvements?    PT Home Exercise Plan Access Code: [email or web address]    Consulted and Agree with Plan of Care Patient           Patient will benefit from skilled therapeutic intervention in order to improve the following deficits and impairments:  Abnormal gait, Pain, Decreased mobility, Decreased range of motion, Decreased strength, Impaired flexibility, Difficulty walking  Visit Diagnosis: Chronic pain of right knee  Stiffness of right knee, not elsewhere classified  Other abnormalities of gait and mobility  Muscle weakness (generalized)     Problem List Patient Active Problem List   Diagnosis Date Noted  . Bilateral primary osteoarthritis of knee 05/11/2017  . Dyspareunia, female 11/13/2011  . Type 2 diabetes mellitus ([place]) 11/13/2011  . Alopecia 11/13/2011  . History of chicken pox   . Headache(784.0)   . Hypothyroidism   . Menses, irregular   . Hirsutism   . H/O fatigue   . Fibroid uterus    Scot Jun, PT, DPT, OCS, ATC 10/07/19  3:07 PM    [facility] [address] [place], [place],  [address] Phone: [phone]   Fax:  [phone]  Name: Krystal Lang MRN: [id number] Date of Birth: [birth year]

## 2019-10-10 ENCOUNTER — Encounter: Payer: Medicare Other | Admitting: Physical Therapy

## 2019-10-12 ENCOUNTER — Other Ambulatory Visit: Payer: Self-pay

## 2019-10-12 ENCOUNTER — Ambulatory Visit (INDEPENDENT_AMBULATORY_CARE_PROVIDER_SITE_OTHER): Payer: Medicare Other | Admitting: Physical Therapy

## 2019-10-12 ENCOUNTER — Encounter: Payer: Self-pay | Admitting: Physical Therapy

## 2019-10-12 DIAGNOSIS — G8929 Other chronic pain: Secondary | ICD-10-CM

## 2019-10-12 DIAGNOSIS — M25561 Pain in right knee: Secondary | ICD-10-CM

## 2019-10-12 DIAGNOSIS — M25661 Stiffness of right knee, not elsewhere classified: Secondary | ICD-10-CM

## 2019-10-12 DIAGNOSIS — R2689 Other abnormalities of gait and mobility: Secondary | ICD-10-CM

## 2019-10-12 DIAGNOSIS — M6281 Muscle weakness (generalized): Secondary | ICD-10-CM | POA: Diagnosis not present

## 2019-10-12 NOTE — Therapy (Signed)
[facility] [address] [place], [place], [address] Phone: [phone]   Fax:  [phone]  Physical Therapy Treatment  Patient Details  Name: Krystal Lang MRN: [id number] Date of Birth: 02-08-1956 Referring Provider (PT): Joni Fears, MD   Encounter Date: 10/12/2019   PT End of Session - 10/12/19 1245    Visit Number 4    Number of Visits 12    Date for PT Re-Evaluation 11/04/19    PT Start Time 1148   pt requested to leave early   PT Stop Time 1215    PT Time Calculation (min) 27 min    Activity Tolerance Patient tolerated treatment well    Behavior During Therapy [facility] for tasks assessed/performed           Past Medical History:  Diagnosis Date   Diabetes mellitus    Fibroid uterus 2001   H/O fatigue 1998   Headache(784.0)    Hirsutism 1998   Idiopathic   History of chicken pox    Hypothyroidism    Menses, irregular 1998    Past Surgical History:  Procedure Laterality Date   DILATION AND CURETTAGE OF UTERUS  2002   In [place]   KNEE SURGERY     TONSILECTOMY, ADENOIDECTOMY, BILATERAL MYRINGOTOMY AND TUBES     TONSILLECTOMY      There were no vitals filed for this visit.   Subjective Assessment - 10/12/19 1151    Subjective says she hasn't had time to do exercises due to family in town.    Pertinent History DM, Headaches    Limitations Standing    How long can you stand comfortably? couple of hours    Diagnostic tests MRI: significant tricompartmental arthritis predominantly in the medial compartment where there are areas of complete cartilage loss.  There is also a radial tear of the medial meniscus posteriorly.    Patient Stated Goals improve motion in knees, improve strength    Pain Score 4     Pain Location Knee    Pain Orientation Right    Pain Descriptors / Indicators Sharp    Pain Type Chronic pain    Pain Onset More than a month ago    Pain Frequency Intermittent    Aggravating Factors   stairs, bending at times    Pain Relieving Factors injection                             [facility] Adult PT Treatment/Exercise - 10/12/19 1153      Knee/Hip Exercises: Stretches   Passive Hamstring Stretch Right;3 reps;30 seconds    Passive Hamstring Stretch Limitations supine with strap    Quad Stretch Right;3 reps;30 seconds    Quad Stretch Limitations supine with strap, RLE off elevated mat      Knee/Hip Exercises: Aerobic   Nustep Lvl 5 6 mins      Knee/Hip Exercises: Seated   Long Arc Quad Right;15 reps;Weights    Long Arc Quad Weight 4 lbs.    Hamstring Curl Right;15 reps    Hamstring Limitations L4 band      Knee/Hip Exercises: Supine   Bridges Both;15 reps                    PT Short Term Goals - 10/12/19 1246      PT SHORT TERM GOAL #1   Title be independent in initial HEP    Baseline 7/14: inconsistent compliance due  to schedule    Status Partially Met    Target Date 10/07/19             PT Long Term Goals - 09/27/19 1520      PT LONG TERM GOAL #1   Title be independent in advanced HEP    Status On-going    Target Date 11/04/19      PT LONG TERM GOAL #2   Title report pain decrease by 50% with transitional movements for improved function    Status On-going    Target Date 11/04/19      PT LONG TERM GOAL #3   Title improve strength and decrease pain to descend steps with step-over-step gait > or = to 50% of the time    Status On-going    Target Date 11/04/19      PT LONG TERM GOAL #4   Title n/a      PT LONG TERM GOAL #5   Title n/a                 Plan - 10/12/19 1246    Clinical Impression Statement Pt tolerated session well focused on light strengthening activities.  [employer] in her home over the weekend so unable to do HEP at this time.  Still has a lot of questions about TKA.  Will continue to benefit from PT to maximize function.    Personal Factors and Comorbidities Comorbidity 1    Comorbidities  DM    Examination-Activity Limitations Lift;Stand;Locomotion Level;Sit;Squat;Stairs    Examination-Participation Restrictions Meal Prep;Cleaning;Community Activity    Stability/Clinical Decision Making Stable/Uncomplicated    Rehab Potential Good    PT Frequency 2x / week    PT Duration 6 weeks    PT Treatment/Interventions ADLs/Self Care Home Management;Cryotherapy;Electrical Stimulation;Ultrasound;Moist Heat;Gait training;Stair training;Functional mobility training;Therapeutic activities;Therapeutic exercise;Balance training;Neuromuscular re-education;Patient/family education;Manual techniques;Taping;Dry needling;Passive range of motion    PT Next Visit Plan Balance, strength gains.    PT Home Exercise Plan Access Code: [email or web address]    Consulted and Agree with Plan of Care Patient           Patient will benefit from skilled therapeutic intervention in order to improve the following deficits and impairments:  Abnormal gait, Pain, Decreased mobility, Decreased range of motion, Decreased strength, Impaired flexibility, Difficulty walking  Visit Diagnosis: Chronic pain of right knee  Stiffness of right knee, not elsewhere classified  Other abnormalities of gait and mobility  Muscle weakness (generalized)     Problem List Patient Active Problem List   Diagnosis Date Noted   Bilateral primary osteoarthritis of knee 05/11/2017   Dyspareunia, female 11/13/2011   Type 2 diabetes mellitus ([place]) 11/13/2011   Alopecia 11/13/2011   History of chicken pox    Headache(784.0)    Hypothyroidism    Menses, irregular    Hirsutism    H/O fatigue    Fibroid uterus       Laureen Abrahams, PT, DPT 10/12/19 12:48 PM     [facility] [address] [place], [place], [address] Phone: [phone]   Fax:  [phone]  Name: Krystal Lang MRN: [id number] Date of Birth: 11/11/1955

## 2019-10-17 ENCOUNTER — Telehealth: Payer: Self-pay | Admitting: Rehabilitative and Restorative Service Providers"

## 2019-10-17 ENCOUNTER — Encounter: Payer: Medicare Other | Admitting: Rehabilitative and Restorative Service Providers"

## 2019-10-17 NOTE — Telephone Encounter (Signed)
No show follow up call after 15 mins after appointment time.  Left message with reminder of next appointment time  Scot Jun, PT, DPT, OCS, ATC 10/17/19  2:03 PM

## 2019-10-19 ENCOUNTER — Encounter: Payer: Self-pay | Admitting: Rehabilitative and Restorative Service Providers"

## 2019-10-19 ENCOUNTER — Ambulatory Visit (INDEPENDENT_AMBULATORY_CARE_PROVIDER_SITE_OTHER): Payer: Medicare Other | Admitting: Rehabilitative and Restorative Service Providers"

## 2019-10-19 ENCOUNTER — Other Ambulatory Visit: Payer: Self-pay

## 2019-10-19 DIAGNOSIS — M25561 Pain in right knee: Secondary | ICD-10-CM | POA: Diagnosis not present

## 2019-10-19 DIAGNOSIS — G8929 Other chronic pain: Secondary | ICD-10-CM

## 2019-10-19 DIAGNOSIS — M25661 Stiffness of right knee, not elsewhere classified: Secondary | ICD-10-CM | POA: Diagnosis not present

## 2019-10-19 DIAGNOSIS — R2689 Other abnormalities of gait and mobility: Secondary | ICD-10-CM

## 2019-10-19 DIAGNOSIS — M6281 Muscle weakness (generalized): Secondary | ICD-10-CM

## 2019-10-19 NOTE — Therapy (Signed)
[facility] [address] [place], [place], [address] Phone: [phone]   Fax:  [phone]  Physical Therapy Treatment  Patient Details  Name: Krystal Lang MRN: [id number] Date of Birth: 10/08/1955 Referring Provider (PT): Joni Fears, MD   Encounter Date: 10/19/2019   PT End of Session - 10/19/19 1400    Visit Number 5    Number of Visits 12    Date for PT Re-Evaluation 11/04/19    PT Start Time 1346    PT Stop Time 1425    PT Time Calculation (min) 39 min    Activity Tolerance Patient tolerated treatment well    Behavior During Therapy [facility] for tasks assessed/performed           Past Medical History:  Diagnosis Date  . Diabetes mellitus   . Fibroid uterus 2001  . H/O fatigue 1998  . Headache(784.0)   . 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 History of chicken pox   . Hypothyroidism   . Menses, irregular 1998    Past Surgical History:  Procedure Laterality Date  . DILATION AND CURETTAGE OF UTERUS  2002   In [place]  . KNEE SURGERY    . TONSILECTOMY, ADENOIDECTOMY, BILATERAL MYRINGOTOMY AND TUBES    . TONSILLECTOMY      There were no vitals filed for this visit.   Subjective Assessment - 10/19/19 1356    Subjective Pt. indicated feeling pain in both knees, 3-4/10 on Lt and 6-7/10 on Rt.    Pertinent History DM, Headaches    Limitations Standing    How long can you stand comfortably? couple of hours    Diagnostic tests MRI: significant tricompartmental arthritis predominantly in the medial compartment where there are areas of complete cartilage loss.  There is also a radial tear of the medial meniscus posteriorly.    Patient Stated Goals improve motion in knees, improve strength    Currently in Pain? Yes    Pain Score 6     Pain Location Knee    Pain Orientation Right;Left    Pain Onset More than a month ago                             [facility] Adult PT Treatment/Exercise - 10/19/19 0001      Knee/Hip  Exercises: Aerobic   Nustep Lvl 5 12 mins      Knee/Hip Exercises: Seated   Long Arc Quad Strengthening;Both;3 sets;10 reps    Long Arc Quad Weight 5 lbs.    Other Seated Knee/Hip Exercises seated SLR 2 x 12 bilateral    Hamstring Curl Both;3 sets;10 reps;Other (comment)   blue band                   PT Short Term Goals - 10/12/19 1246      PT SHORT TERM GOAL #1   Title be independent in initial HEP    Baseline 7/14: inconsistent compliance due to schedule    Status Partially Met    Target Date 10/07/19             PT Long Term Goals - 09/27/19 1520      PT LONG TERM GOAL #1   Title be independent in advanced HEP    Status On-going    Target Date 11/04/19      PT LONG TERM GOAL #2   Title report pain decrease by 50% with transitional movements for  improved function    Status On-going    Target Date 11/04/19      PT LONG TERM GOAL #3   Title improve strength and decrease pain to descend steps with step-over-step gait > or = to 50% of the time    Status On-going    Target Date 11/04/19      PT LONG TERM GOAL #4   Title n/a      PT LONG TERM GOAL #5   Title n/a                 Plan - 10/19/19 1406    Clinical Impression Statement Continued discussion on POC at this time  Discussed overall expectations and relationship to plans for TKAs.  In summary, Pt. to benefit from intervention for mobility, strength related to bilateral LE to improve function and help manage symptoms regardless of TKA surgery.  FABER movement limited due to knee pain on Rt LE.    Personal Factors and Comorbidities Comorbidity 1    Comorbidities DM    Examination-Activity Limitations Lift;Stand;Locomotion Level;Sit;Squat;Stairs    Examination-Participation Restrictions Meal Prep;Cleaning;Community Activity    Stability/Clinical Decision Making Stable/Uncomplicated    Rehab Potential Good    PT Frequency 2x / week    PT Duration 6 weeks    PT Treatment/Interventions  ADLs/Self Care Home Management;Cryotherapy;Electrical Stimulation;Ultrasound;Moist Heat;Gait training;Stair training;Functional mobility training;Therapeutic activities;Therapeutic exercise;Balance training;Neuromuscular re-education;Patient/family education;Manual techniques;Taping;Dry needling;Passive range of motion    PT Next Visit Plan Focus on improving HEP and reassess on next visit for MD return.    PT Home Exercise Plan Access Code: [email or web address]    Consulted and Agree with Plan of Care Patient           Patient will benefit from skilled therapeutic intervention in order to improve the following deficits and impairments:  Abnormal gait, Pain, Decreased mobility, Decreased range of motion, Decreased strength, Impaired flexibility, Difficulty walking  Visit Diagnosis: Chronic pain of right knee  Stiffness of right knee, not elsewhere classified  Other abnormalities of gait and mobility  Muscle weakness (generalized)     Problem List Patient Active Problem List   Diagnosis Date Noted  . Bilateral primary osteoarthritis of knee 05/11/2017  . Dyspareunia, female 11/13/2011  . Type 2 diabetes mellitus ([place]) 11/13/2011  . Alopecia 11/13/2011  . History of chicken pox   . Headache(784.0)   . Hypothyroidism   . Menses, irregular   . Hirsutism   . H/O fatigue   . Fibroid uterus    Scot Jun, PT, DPT, OCS, ATC 10/19/19  2:22 PM    [facility] [address] [place], [place], [address] Phone: [phone]   Fax:  [phone]  Name: Krystal Lang MRN: [id number] Date of Birth: [birth year]

## 2019-10-20 ENCOUNTER — Ambulatory Visit: Payer: Medicare Other | Admitting: Orthopaedic Surgery

## 2019-10-24 ENCOUNTER — Other Ambulatory Visit: Payer: Self-pay

## 2019-10-24 ENCOUNTER — Encounter: Payer: Self-pay | Admitting: Physical Therapy

## 2019-10-24 ENCOUNTER — Ambulatory Visit (INDEPENDENT_AMBULATORY_CARE_PROVIDER_SITE_OTHER): Payer: Medicare Other | Admitting: Physical Therapy

## 2019-10-24 DIAGNOSIS — M25561 Pain in right knee: Secondary | ICD-10-CM | POA: Diagnosis not present

## 2019-10-24 DIAGNOSIS — M6281 Muscle weakness (generalized): Secondary | ICD-10-CM

## 2019-10-24 DIAGNOSIS — G8929 Other chronic pain: Secondary | ICD-10-CM

## 2019-10-24 DIAGNOSIS — R2689 Other abnormalities of gait and mobility: Secondary | ICD-10-CM

## 2019-10-24 DIAGNOSIS — M25661 Stiffness of right knee, not elsewhere classified: Secondary | ICD-10-CM

## 2019-10-24 NOTE — Therapy (Signed)
[facility] [address] [place], [place], [address] Phone: [phone]   Fax:  [phone]  Physical Therapy Treatment  Patient Details  Name: Krystal Lang MRN: [id number] Date of Birth: 1955-11-15 Referring Provider (PT): Joni Fears, MD   Encounter Date: 10/24/2019   PT End of Session - 10/24/19 1433    Visit Number 6    Number of Visits 12    Date for PT Re-Evaluation 11/04/19    PT Start Time 1352   pt arrived late   PT Stop Time 1425    PT Time Calculation (min) 33 min    Activity Tolerance Patient tolerated treatment well    Behavior During Therapy [facility] for tasks assessed/performed           Past Medical History:  Diagnosis Date  . Diabetes mellitus   . Fibroid uterus 2001  . H/O fatigue 1998  . Headache(784.0)   . 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 History of chicken pox   . Hypothyroidism   . Menses, irregular 1998    Past Surgical History:  Procedure Laterality Date  . DILATION AND CURETTAGE OF UTERUS  2002   In [place]  . KNEE SURGERY    . TONSILECTOMY, ADENOIDECTOMY, BILATERAL MYRINGOTOMY AND TUBES    . TONSILLECTOMY      There were no vitals filed for this visit.   Subjective Assessment - 10/24/19 1353    Subjective wasn't able to do any exercises over the weekend.  getting injection soon.    Pertinent History DM, Headaches    Limitations Standing    How long can you stand comfortably? couple of hours    Diagnostic tests MRI: significant tricompartmental arthritis predominantly in the medial compartment where there are areas of complete cartilage loss.  There is also a radial tear of the medial meniscus posteriorly.    Patient Stated Goals improve motion in knees, improve strength    Currently in Pain? Yes    Pain Score 6     Pain Location Knee    Pain Orientation Right;Left    Pain Descriptors / Indicators Sharp    Pain Type Chronic pain    Pain Onset More than a month ago    Pain Frequency  Intermittent    Aggravating Factors  stairs, bending at times    Pain Relieving Factors injection                             [facility] Adult PT Treatment/Exercise - 10/24/19 1356      Knee/Hip Exercises: Aerobic   Nustep L3 x 6 min      Knee/Hip Exercises: Standing   Other Standing Knee Exercises deadlifts 10# KB 2x10      Knee/Hip Exercises: Seated   Long Arc Quad Strengthening;Both;3 sets;10 reps    Long Arc Quad Weight 5 lbs.    Marching Both;20 reps;Weights    Marching Limitations 5                    PT Short Term Goals - 10/12/19 1246      PT SHORT TERM GOAL #1   Title be independent in initial HEP    Baseline 7/14: inconsistent compliance due to schedule    Status Partially Met    Target Date 10/07/19             PT Long Term Goals - 09/27/19 1520  PT LONG TERM GOAL #1   Title be independent in advanced HEP    Status On-going    Target Date 11/04/19      PT LONG TERM GOAL #2   Title report pain decrease by 50% with transitional movements for improved function    Status On-going    Target Date 11/04/19      PT LONG TERM GOAL #3   Title improve strength and decrease pain to descend steps with step-over-step gait > or = to 50% of the time    Status On-going    Target Date 11/04/19      PT LONG TERM GOAL #4   Title n/a      PT LONG TERM GOAL #5   Title n/a                 Plan - 10/24/19 1433    Clinical Impression Statement Pt overall with limited pain relief at this time, and she continues to be inconsistent with HEP.  She's scheduled for injection tomorrow so will see how she responds following.  Will continue to benefit from PT to maximize function and address pain and work on strengthening.    Personal Factors and Comorbidities Comorbidity 1    Comorbidities DM    Examination-Activity Limitations Lift;Stand;Locomotion Level;Sit;Squat;Stairs    Examination-Participation Restrictions Meal  Prep;Cleaning;Community Activity    Stability/Clinical Decision Making Stable/Uncomplicated    Rehab Potential Good    PT Frequency 2x / week    PT Duration 6 weeks    PT Treatment/Interventions ADLs/Self Care Home Management;Cryotherapy;Electrical Stimulation;Ultrasound;Moist Heat;Gait training;Stair training;Functional mobility training;Therapeutic activities;Therapeutic exercise;Balance training;Neuromuscular re-education;Patient/family education;Manual techniques;Taping;Dry needling;Passive range of motion    PT Next Visit Plan see how injection went, continue to reinforce HEP compliance    PT Home Exercise Plan Access Code: [email or web address]    Consulted and Agree with Plan of Care Patient           Patient will benefit from skilled therapeutic intervention in order to improve the following deficits and impairments:  Abnormal gait, Pain, Decreased mobility, Decreased range of motion, Decreased strength, Impaired flexibility, Difficulty walking  Visit Diagnosis: Chronic pain of right knee  Stiffness of right knee, not elsewhere classified  Other abnormalities of gait and mobility  Muscle weakness (generalized)     Problem List Patient Active Problem List   Diagnosis Date Noted  . Bilateral primary osteoarthritis of knee 05/11/2017  . Dyspareunia, female 11/13/2011  . Type 2 diabetes mellitus ([place]) 11/13/2011  . Alopecia 11/13/2011  . History of chicken pox   . Headache(784.0)   . Hypothyroidism   . Menses, irregular   . Hirsutism   . H/O fatigue   . Fibroid uterus       Laureen Abrahams, PT, DPT 10/24/19 2:36 PM    [facility] [address] [place], [place], [address] Phone: [phone]   Fax:  [phone]  Name: Krystal Lang MRN: [id number] Date of Birth: 1955-05-10

## 2019-10-25 ENCOUNTER — Encounter: Payer: Self-pay | Admitting: Orthopaedic Surgery

## 2019-10-25 ENCOUNTER — Ambulatory Visit (INDEPENDENT_AMBULATORY_CARE_PROVIDER_SITE_OTHER): Payer: Medicare Other | Admitting: Orthopaedic Surgery

## 2019-10-25 DIAGNOSIS — M170 Bilateral primary osteoarthritis of knee: Secondary | ICD-10-CM

## 2019-10-25 DIAGNOSIS — M1711 Unilateral primary osteoarthritis, right knee: Secondary | ICD-10-CM | POA: Diagnosis not present

## 2019-10-25 MED ORDER — HYLAN G-F 20 48 MG/6ML IX SOSY
48.0000 mg | PREFILLED_SYRINGE | INTRA_ARTICULAR | Status: AC | PRN
Start: 2019-10-25 — End: 2019-10-25
  Administered 2019-10-25: 48 mg via INTRA_ARTICULAR

## 2019-10-25 NOTE — Progress Notes (Signed)
Office Visit Note   Patient: Krystal Lang           Date of Birth: 01/21/1956           MRN: [id number] Visit Date: 10/25/2019              Requested by: Dixie Dials, MD [address] [place],  [place] [address] PCP: Dixie Dials, MD   Assessment & Plan: Visit Diagnoses:  1. Bilateral primary osteoarthritis of knee     Plan: Discussion regarding the arthritis in both the right and left knee and treatment options.  She has been approved for Synvisc 1 I will inject that in the right knee today.  We also had a long discussion regarding exercises and knee replacement.  We will plan to see her back as needed.  We can also reinject with cortisone if necessary.  She had a good result 5 weeks ago  Follow-Up Instructions: Return if symptoms worsen or fail to improve.   Orders:  Orders Placed This Encounter  Procedures   Large Joint Inj: R knee   No orders of the defined types were placed in this encounter.     Procedures: Large Joint Inj: R knee on 10/25/2019 1:54 PM Indications: pain and joint swelling Details: 25 G 1.5 in needle  Arthrogram: No  Medications: 48 mg Hylan 48 MG/6ML Outcome: tolerated well, no immediate complications Procedure, treatment alternatives, risks and benefits explained, specific risks discussed. Consent was given by the patient. Immediately prior to procedure a time out was called to verify the correct patient, procedure, equipment, support staff and site/side marked as required. Patient was prepped and draped in the usual sterile fashion.       Clinical Data: No additional findings.   Subjective: Chief Complaint  Patient presents with   Right Knee - Pain  Many questions regarding the arthritis and viscosupplementation which were answered.  Has been approved for Synvisc 1 which will inject today Patient is diabetic Review of Systems   Objective: Vital Signs: There were no vitals taken for this visit.  Physical  Exam Constitutional:      Appearance: She is well-developed.  Eyes:     Pupils: Pupils are equal, round, and reactive to light.  Pulmonary:     Effort: Pulmonary effort is normal.  Skin:    General: Skin is warm and dry.  Neurological:     Mental Status: She is alert and oriented to person, place, and time.  Psychiatric:        Behavior: Behavior normal.     Ortho Exam right knee was not hot red warm or swollen.  Predominately medial joint pain.  Full extension flexed over 105 degrees without instability.  No popliteal mass.  No distal edema.  Specialty Comments:  No specialty comments available.  Imaging: No results found.   PMFS History: Patient Active Problem List   Diagnosis Date Noted   Bilateral primary osteoarthritis of knee 05/11/2017   Dyspareunia, female 11/13/2011   Type 2 diabetes mellitus ([place]) 11/13/2011   Alopecia 11/13/2011   History of chicken pox    Headache(784.0)    Hypothyroidism    Menses, irregular    Hirsutism    H/O fatigue    Fibroid uterus    Past Medical History:  Diagnosis Date   Diabetes mellitus    Fibroid uterus 2001   H/O fatigue 1998   Headache(784.0)    Hirsutism 1998   Idiopathic   History of chicken pox  Hypothyroidism    Menses, irregular 1998    Family History  Problem Relation Age of Onset   Diabetes Mother    Diabetes Father    Heart disease Father        MI   Diabetes Sister    Diabetes Brother    Heart disease Brother        Angioplasty    Past Surgical History:  Procedure Laterality Date   DILATION AND CURETTAGE OF UTERUS  2002   In [place]   KNEE SURGERY     TONSILECTOMY, ADENOIDECTOMY, BILATERAL MYRINGOTOMY AND TUBES     TONSILLECTOMY     Social History   Occupational History   Not on file  Tobacco Use   Smoking status: Never Smoker   Smokeless tobacco: Never Used  Vaping Use   Vaping Use: Never used  Substance and Sexual Activity   Alcohol use: No   Drug  use: No   Sexual activity: Yes    Birth control/protection: Post-menopausal     Garald Balding, MD   Note - This record has been created using Editor, commissioning.  Chart creation errors have been sought, but may not always  have been located. Such creation errors do not reflect on  the standard of medical care.

## 2019-10-26 ENCOUNTER — Ambulatory Visit (INDEPENDENT_AMBULATORY_CARE_PROVIDER_SITE_OTHER): Payer: Medicare Other | Admitting: Physical Therapy

## 2019-10-26 ENCOUNTER — Other Ambulatory Visit: Payer: Self-pay

## 2019-10-26 ENCOUNTER — Encounter: Payer: Self-pay | Admitting: Physical Therapy

## 2019-10-26 DIAGNOSIS — M6281 Muscle weakness (generalized): Secondary | ICD-10-CM | POA: Diagnosis not present

## 2019-10-26 DIAGNOSIS — R2689 Other abnormalities of gait and mobility: Secondary | ICD-10-CM

## 2019-10-26 DIAGNOSIS — M25661 Stiffness of right knee, not elsewhere classified: Secondary | ICD-10-CM | POA: Diagnosis not present

## 2019-10-26 DIAGNOSIS — M25561 Pain in right knee: Secondary | ICD-10-CM

## 2019-10-26 DIAGNOSIS — G8929 Other chronic pain: Secondary | ICD-10-CM

## 2019-10-26 NOTE — Therapy (Addendum)
[facility] [address] [place], [place], [address] Phone: [phone]   Fax:  [phone]  Physical Therapy Treatment/Discharge Summary  Patient Details  Name: Krystal Lang MRN: [id number] Date of Birth: 10-26-1955 Referring Provider (PT): Krystal Fears, MD   Encounter Date: 10/26/2019   PT End of Session - 10/26/19 1606    Visit Number 7    Number of Visits 12    Date for PT Re-Evaluation 11/04/19    PT Start Time 1301    PT Stop Time 1345    PT Time Calculation (min) 44 min    Activity Tolerance Patient tolerated treatment well    Behavior During Therapy [facility] for tasks assessed/performed           Past Medical History:  Diagnosis Date  . Diabetes mellitus   . Fibroid uterus 2001  . H/O fatigue 1998  . Headache(784.0)   . 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 History of chicken pox   . Hypothyroidism   . Menses, irregular 1998    Past Surgical History:  Procedure Laterality Date  . DILATION AND CURETTAGE OF UTERUS  2002   In [place]  . KNEE SURGERY    . TONSILECTOMY, ADENOIDECTOMY, BILATERAL MYRINGOTOMY AND TUBES    . TONSILLECTOMY      There were no vitals filed for this visit.   Subjective Assessment - 10/26/19 1305    Subjective had injection yesterday, knee is still a little sore    Pertinent History DM, Headaches    Limitations Standing    How long can you stand comfortably? couple of hours    Diagnostic tests MRI: significant tricompartmental arthritis predominantly in the medial compartment where there are areas of complete cartilage loss.  There is also a radial tear of the medial meniscus posteriorly.    Patient Stated Goals improve motion in knees, improve strength    Currently in Pain? Yes    Pain Score 8     Pain Location Knee    Pain Orientation Right    Pain Descriptors / Indicators Aching;Sharp    Pain Type Chronic pain    Pain Onset More than a month ago    Pain Frequency Intermittent    Aggravating  Factors  stairs, bending at times    Pain Relieving Factors injection                             [facility] Adult PT Treatment/Exercise - 10/26/19 1308      Knee/Hip Exercises: Stretches   Passive Hamstring Stretch Right;3 reps;30 seconds    Passive Hamstring Stretch Limitations supine with strap    Gastroc Stretch Right;3 reps;30 seconds      Knee/Hip Exercises: Aerobic   Recumbent Bike partial revolutions x 8 min      Knee/Hip Exercises: Machines for Strengthening   Cybex Knee Extension 5# 2x10    Cybex Knee Flexion 15# 2x10    Total Gym Leg Press 100# 2x10      Knee/Hip Exercises: Standing   Functional Squat 2 sets;10 reps    Functional Squat Limitations 12# KB    Other Standing Knee Exercises deadlifts 12# KB 2x10      Knee/Hip Exercises: Seated   Long Arc Quad Strengthening;Both;3 sets;10 reps    Long Arc Quad Weight 5 lbs.    Marching Both;20 reps;Weights    Marching Limitations 5      Knee/Hip Exercises: Supine   Krystal Lang  Both;20 reps                  PT Education - 10/26/19 1606    Education Details updated HEP    Person(s) Educated Patient            PT Short Term Goals - 10/12/19 1246      PT SHORT TERM GOAL #1   Title be independent in initial HEP    Baseline 7/14: inconsistent compliance due to schedule    Status Partially Met    Target Date 10/07/19             PT Long Term Goals - 09/27/19 1520      PT LONG TERM GOAL #1   Title be independent in advanced HEP    Status On-going    Target Date 11/04/19      PT LONG TERM GOAL #2   Title report pain decrease by 50% with transitional movements for improved function    Status On-going    Target Date 11/04/19      PT LONG TERM GOAL #3   Title improve strength and decrease pain to descend steps with step-over-step gait > or = to 50% of the time    Status On-going    Target Date 11/04/19      PT LONG TERM GOAL #4   Title n/a      PT LONG TERM GOAL #5   Title n/a                  Plan - 10/26/19 1607    Clinical Impression Statement Pt had injection yesterday so knee is more stiff today. Husband came to session to see exercises for home.  Overall no significant change today, will continue to benefit from PT to maximize function.    Personal Factors and Comorbidities Comorbidity 1    Comorbidities DM    Examination-Activity Limitations Lift;Stand;Locomotion Level;Sit;Squat;Stairs    Examination-Participation Restrictions Meal Prep;Cleaning;Community Activity    Stability/Clinical Decision Making Stable/Uncomplicated    Rehab Potential Good    PT Frequency 2x / week    PT Duration 6 weeks    PT Treatment/Interventions ADLs/Self Care Home Management;Cryotherapy;Electrical Stimulation;Ultrasound;Moist Heat;Gait training;Stair training;Functional mobility training;Therapeutic activities;Therapeutic exercise;Balance training;Neuromuscular re-education;Patient/family education;Manual techniques;Taping;Dry needling;Passive range of motion    PT Next Visit Plan continue with strengthening    PT Home Exercise Plan Access Code: [email or web address]    Consulted and Agree with Plan of Care Patient           Patient will benefit from skilled therapeutic intervention in order to improve the following deficits and impairments:  Abnormal gait, Pain, Decreased mobility, Decreased range of motion, Decreased strength, Impaired flexibility, Difficulty walking  Visit Diagnosis: Chronic pain of right knee  Stiffness of right knee, not elsewhere classified  Other abnormalities of gait and mobility  Muscle weakness (generalized)     Problem List Patient Active Problem List   Diagnosis Date Noted  . Bilateral primary osteoarthritis of knee 05/11/2017  . Dyspareunia, female 11/13/2011  . Type 2 diabetes mellitus ([place]) 11/13/2011  . Alopecia 11/13/2011  . History of chicken pox   . Headache(784.0)   . Hypothyroidism   . Menses, irregular   . Hirsutism   . H/O  fatigue   . Fibroid uterus       Krystal Lang, PT, DPT 10/26/19 4:08 PM    [facility] [address] [place], [place], [address] Phone: [phone]   Fax:  [phone]  Name: Krystal Lang MRN: [id number] Date of Birth: 1955-07-03     PHYSICAL THERAPY DISCHARGE SUMMARY  Visits from Start of Care: 7  Current functional level related to goals / functional outcomes: See above   Remaining deficits: See above   Education / Equipment: HEP  Plan: Patient agrees to discharge.  Patient goals were not met. Patient is being discharged due to being pleased with the current functional level.  ?????     Krystal Lang, PT, DPT 01/16/20 11:28 AM  [facility] [address] [place], [place], [address] Phone: [phone]   Fax:  [phone]

## 2019-10-31 ENCOUNTER — Encounter: Payer: Medicare Other | Admitting: Physical Therapy

## 2019-10-31 ENCOUNTER — Telehealth: Payer: Self-pay | Admitting: Physical Therapy

## 2019-10-31 NOTE — Telephone Encounter (Signed)
Called pt as she NS for PT appt today.  She reports continued pain following injection and meant to call to cancel today.  She also feels like she has a good HEP and would like to cx remaining appts and will call if additional PT is needed.  Laureen Abrahams, PT, DPT 10/31/19 2:05 PM

## 2019-11-02 ENCOUNTER — Encounter: Payer: Medicare Other | Admitting: Physical Therapy

## 2019-12-16 ENCOUNTER — Encounter (INDEPENDENT_AMBULATORY_CARE_PROVIDER_SITE_OTHER): Payer: Self-pay | Admitting: Ophthalmology

## 2019-12-16 ENCOUNTER — Ambulatory Visit (INDEPENDENT_AMBULATORY_CARE_PROVIDER_SITE_OTHER): Payer: Medicare Other | Admitting: Ophthalmology

## 2019-12-16 ENCOUNTER — Other Ambulatory Visit: Payer: Self-pay

## 2019-12-16 DIAGNOSIS — E119 Type 2 diabetes mellitus without complications: Secondary | ICD-10-CM

## 2019-12-16 DIAGNOSIS — H43811 Vitreous degeneration, right eye: Secondary | ICD-10-CM | POA: Diagnosis not present

## 2019-12-16 DIAGNOSIS — H3581 Retinal edema: Secondary | ICD-10-CM

## 2019-12-16 DIAGNOSIS — H25813 Combined forms of age-related cataract, bilateral: Secondary | ICD-10-CM

## 2019-12-16 NOTE — Progress Notes (Signed)
[facility] Note  12/16/2019     CHIEF COMPLAINT Patient presents for Retina Evaluation   HISTORY OF PRESENT ILLNESS: Krystal Lang is a 64 y.o. female who presents to the clinic today for:   HPI    Retina Evaluation    In right eye.  This started 1 day ago.  Duration: 5 min.  Associated Symptoms Flashes.  Context:  distance vision, mid-range vision and near vision.  Treatments tried include artificial tears.  I, the attending physician,  performed the HPI with the patient and updated documentation appropriately.          Comments    Pt has seen Dr. Zigmund Daniel in the past.  64 y/o female pt referred by Dr. Katy Fitch (since Dr. Coralyn Pear is on call) for eval of flickering light in OD vision.  Occurred yesterday for about 5 minutes, and has not occurred prior or since.  No pain, floaters.  VA good OU [place].  BS unknown.  A1C 6.2.  Systane prn OU.       Last edited by Bernarda Caffey, MD on 12/17/2019 11:56 AM. (History)    pt is here on the referral of Dr. Wyatt Portela for concern of flashing lights OD, pt states last night around 8:00 she got in the car to go to the store and she started to see a "sparkler" in her right eye vision, she states once she got to the store, the sparkler stopped, but once back in the car it started up again, she states when she got home she went to bed and has not seen anything like that since then, pts daughter is a cornea specialist in [place] and is on the phone during exam, pt denies floaters in vision, pt has rosacea and is having a flare up, pt states left eye is twitching a lot over the past 2-3 days  Referring physician: Debbra Riding, MD [address] [place],  [place] [address]  HISTORICAL INFORMATION:   Selected notes from the [facility] Referred to Dr. Coralyn Pear by Dr. Katy Fitch since Dr. Coralyn Pear is on call.  Pt experienced "flickers" of light in OD vision last night for about 5 min.    CURRENT MEDICATIONS: No current  outpatient medications on file. (Ophthalmic Drugs)   No current facility-administered medications for this visit. (Ophthalmic Drugs)   Current Outpatient Medications (Other)  Medication Sig  . empagliflozin (JARDIANCE) 10 MG TABS tablet Take 10 mg by mouth daily.  Marland Kitchen gabapentin (NEURONTIN) 100 MG capsule Take 100 mg by mouth 3 (three) times daily.   Marland Kitchen levothyroxine (SYNTHROID) 112 MCG tablet Take 112 mcg by mouth daily.  . metFORMIN (GLUCOPHAGE) 1000 MG tablet Take 1,000 mg by mouth 2 (two) times daily.  . sitaGLIPtan-metformin (JANUMET) 50-500 MG per tablet Take 1 tablet by mouth 2 (two) times daily with a meal.  . sitaGLIPtin (JANUVIA) 100 MG tablet Take 100 mg by mouth daily.  Marland Kitchen amoxicillin (AMOXIL) 500 MG capsule Take 500 mg by mouth 3 (three) times daily. (Patient not taking: Reported on 12/16/2019)  . atorvastatin (LIPITOR) 20 MG tablet Take 20 mg by mouth daily. (Patient not taking: Reported on 12/16/2019)  . finasteride (PROSCAR) 5 MG tablet Take by mouth. (Patient not taking: Reported on 12/16/2019)  . fluocinonide (LIDEX) 0.05 % external solution Apply on scalp daily for 2 weeks and stop it . (Patient not taking: Reported on 12/16/2019)  . glimepiride (AMARYL) 4 MG tablet Take  4 mg by mouth daily with breakfast.  (Patient not taking: Reported on 12/16/2019)  . HYDROcodone-acetaminophen (NORCO) 10-325 MG tablet Take 1 tablet by mouth daily as needed.  (Patient not taking: Reported on 12/16/2019)  . levothyroxine (SYNTHROID, LEVOTHROID) 125 MCG tablet Take 125 mcg by mouth daily. (Patient not taking: Reported on 12/16/2019)  . methylPREDNISolone (MEDROL DOSEPAK) 4 MG TBPK tablet Take as directed per package instructions (Patient not taking: Reported on 12/16/2019)  . metroNIDAZOLE (METROCREAM) 0.75 % cream Apply topically. (Patient not taking: Reported on 12/16/2019)   No current facility-administered medications for this visit. (Other)      REVIEW OF SYSTEMS: ROS    Positive for:  Musculoskeletal, Endocrine, Eyes   Negative for: Constitutional, Gastrointestinal, Neurological, Skin, Genitourinary, HENT, Cardiovascular, Respiratory, Psychiatric, Allergic/Imm, Heme/Lymph   Last edited by Matthew Folks, COA on 12/16/2019  2:37 PM. (History)       ALLERGIES No Known Allergies  PAST MEDICAL HISTORY Past Medical History:  Diagnosis Date  . Diabetes mellitus   . Fibroid uterus 2001  . H/O fatigue 1998  . Headache(784.0)   . 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 History of chicken pox   . Hypothyroidism   . Menses, irregular 1998   Past Surgical History:  Procedure Laterality Date  . DILATION AND CURETTAGE OF UTERUS  2002   In [place]  . KNEE SURGERY    . TONSILECTOMY, ADENOIDECTOMY, BILATERAL MYRINGOTOMY AND TUBES    . TONSILLECTOMY      FAMILY HISTORY Family History  Problem Relation Age of Onset  . Diabetes Mother   . Diabetes Father   . Heart disease Father        MI  . Diabetes Sister   . Macular degeneration Sister   . Diabetes Brother   . Heart disease Brother        Angioplasty    SOCIAL HISTORY Social History   Tobacco Use  . Smoking status: Never Smoker  . Smokeless tobacco: Never Used  Vaping Use  . Vaping Use: Never used  Substance Use Topics  . Alcohol use: No  . Drug use: No         OPHTHALMIC EXAM:  Base Eye Exam    Visual Acuity (Snellen - Linear)      Right Left   Dist [place] 20/40 -2 20/20 -2   Dist ph [place] 20/20 -2        Tonometry (Tonopen, 2:40 PM)      Right Left   Pressure 20 21       Pupils      Dark Light Shape React APD   Right 3 2 Round Brisk None   Left 3 2 Round Brisk None       Visual Fields (Counting fingers)      Left Right    Full Full       Extraocular Movement      Right Left    Full, Ortho Full, Ortho       Neuro/Psych    Oriented x3: Yes   Mood/Affect: Normal       Dilation    Both eyes: 1.0% Mydriacyl, 2.5% Phenylephrine @ 2:40 PM        Slit Lamp and Fundus Exam    External  Exam      Right Left   External rosacea on forehead, nose and upper cheeks rosacea on forehead, nose and upper cheeks       Slit Lamp Exam  Right Left   Lids/Lashes Dermatochalasis - upper lid Dermatochalasis - upper lid, Meibomian gland dysfunction, mild Telangiectasia   Conjunctiva/Sclera White and quiet temporal pinguecula   Cornea Clear Trace Debris in tear film   Anterior Chamber deep, narrow temporal angle deep, narrow temporal angle   Iris Round and dilated; no NVI Round and moderately dilated, No NVI   Lens 1-2+ Nuclear sclerosis, 1-2+ Cortical cataract 2+ Nuclear sclerosis, 2+ Cortical cataract   Vitreous Vitreous syneresis, no pigment, Posterior vitreous detachment, vitreous condensations Vitreous syneresis, no pigment       Fundus Exam      Right Left   Disc Pink and Sharp, Compact, focal PPP temporal Pink and Sharp, Compact   C/D Ratio 0.2 0.2   Macula Flat, Good foveal reflex, mild RPE mottling, No heme or edema Flat, Good foveal reflex, mild RPE mottling, mild drusen temporal macula, No heme or edema   Vessels Vascular attenuation, mild Copper wiring Vascular attenuation, mild Copper wiring, mild tortuousity   Periphery Attached, mild pigmented cystoid degeneration, no heme; no RT/RD on 360 scleral depressed exam Attached, mild pigmented cystoid degeneration inferiorly, no heme        Refraction    Manifest Refraction      Sphere Cylinder Axis Dist VA   Right -1.00 +0.25 145 20/20-2   Left -0.50 Sphere  20/20          IMAGING AND PROCEDURES  Imaging and Procedures for 12/16/2019  OCT, Retina - OU - Both Eyes       Right Eye Quality was good. Central Foveal Thickness: 264. Progression has no prior data. Findings include normal foveal contour, no IRF, no SRF (Vitreous opacities).   Left Eye Quality was good. Central Foveal Thickness: 275. Progression has no prior data. Findings include normal foveal contour, no IRF, no SRF.   Notes *Images captured and  stored on drive  Diagnosis / Impression:  NFP, no IRF/SRF OU  Clinical management:  See below  Abbreviations: NFP - Normal foveal profile. CME - cystoid macular edema. PED - pigment epithelial detachment. IRF - intraretinal fluid. SRF - subretinal fluid. EZ - ellipsoid zone. ERM - epiretinal membrane. ORA - outer retinal atrophy. ORT - outer retinal tubulation. SRHM - subretinal hyper-reflective material. IRHM - intraretinal hyper-reflective material                 ASSESSMENT/PLAN:    ICD-10-CM   1. Posterior vitreous detachment of right eye  H43.811   2. Retinal edema  H35.81 OCT, Retina - OU - Both Eyes  3. Diabetes mellitus type 2 without retinopathy ([place])  E11.9   4. Combined forms of age-related cataract of both eyes  H25.813     1,2. PVD / vitreous syneresis OD  - symptomatic w/ 5 min episode of flashes OD on 9.16.21  - Discussed findings and prognosis  - No RT or RD on 360 scleral depressed exam  - Reviewed s/s of RT/RD  - Strict return precautions for any such RT/RD signs/symptoms  - f/u in 3-4 wks -- DFE/OCT  3. Diabetes mellitus, type 2 without retinopathy - The incidence, risk factors for progression, natural history and treatment options for diabetic retinopathy  were discussed with patient.   - The need for close monitoring of blood glucose, blood pressure, and serum lipids, avoiding cigarette or any type of tobacco, and the need for long term follow up was also discussed with patient. - f/u in 1 year, sooner prn  4. Mixed  Cataract OU - The symptoms of cataract, surgical options, and treatments and risks were discussed with patient. - discussed diagnosis and progression - not yet visually significant - monitor for now   Ophthalmic Meds Ordered this visit:  No orders of the defined types were placed in this encounter.      Return for f/u 3-4 weeks, PVD OD, DFE, OCT.  There are no Patient Instructions on file for this visit.   Explained the  diagnoses, plan, and follow up with the patient and they expressed understanding.  Patient expressed understanding of the importance of proper follow up care.  This document serves as a record of services personally performed by Gardiner Sleeper, MD, PhD. It was created on their behalf by Estill Bakes, COT an ophthalmic technician. The creation of this record is the provider's dictation and/or activities during the visit.    Electronically signed by: Estill Bakes, COT 9.17.21 @ 12:38 PM  Gardiner Sleeper, M.D., Ph.D. Diseases & Surgery of the Retina and [place] 9.17.21  I have reviewed the above documentation for accuracy and completeness, and I agree with the above. Gardiner Sleeper, M.D., Ph.D. 12/17/19 12:38 PM   Abbreviations: M myopia (nearsighted); A astigmatism; H hyperopia (farsighted); P presbyopia; Mrx spectacle prescription;  CTL contact lenses; OD right eye; OS left eye; OU both eyes  XT exotropia; ET esotropia; PEK punctate epithelial keratitis; PEE punctate epithelial erosions; DES dry eye syndrome; MGD meibomian gland dysfunction; ATs artificial tears; PFAT's preservative free artificial tears; [place] nuclear sclerotic cataract; PSC posterior subcapsular cataract; ERM epi-retinal membrane; PVD posterior vitreous detachment; RD retinal detachment; DM diabetes mellitus; DR diabetic retinopathy; NPDR non-proliferative diabetic retinopathy; PDR proliferative diabetic retinopathy; CSME clinically significant macular edema; DME diabetic macular edema; dbh dot blot hemorrhages; CWS cotton wool spot; POAG primary open angle glaucoma; C/D cup-to-disc ratio; HVF humphrey visual field; GVF goldmann visual field; OCT optical coherence tomography; IOP intraocular pressure; BRVO Branch retinal vein occlusion; CRVO central retinal vein occlusion; CRAO central retinal artery occlusion; BRAO branch retinal artery occlusion; RT retinal tear; SB scleral buckle; PPV pars  plana vitrectomy; VH Vitreous hemorrhage; PRP panretinal laser photocoagulation; IVK intravitreal kenalog; VMT vitreomacular traction; MH Macular hole;  NVD neovascularization of the disc; NVE neovascularization elsewhere; AREDS age related eye disease study; ARMD age related macular degeneration; POAG primary open angle glaucoma; EBMD epithelial/anterior basement membrane dystrophy; ACIOL anterior chamber intraocular lens; IOL intraocular lens; PCIOL posterior chamber intraocular lens; Phaco/IOL phacoemulsification with intraocular lens placement; [place] photorefractive keratectomy; LASIK laser assisted in situ keratomileusis; HTN hypertension; DM diabetes mellitus; COPD chronic obstructive pulmonary disease

## 2019-12-17 ENCOUNTER — Encounter (INDEPENDENT_AMBULATORY_CARE_PROVIDER_SITE_OTHER): Payer: Self-pay | Admitting: Ophthalmology

## 2020-01-05 NOTE — Progress Notes (Signed)
[facility] Note  01/09/2020     CHIEF COMPLAINT Patient presents for Retina Follow Up   HISTORY OF PRESENT ILLNESS: Krystal Lang is a 64 y.o. female who presents to the clinic today for:   HPI    Retina Follow Up    Patient presents with  PVD.  In right eye.  This started 4 weeks ago.  Severity is moderate.  Duration of 4 weeks.  Since onset it is stable.  I, the attending physician,  performed the HPI with the patient and updated documentation appropriately.          Comments    64 y/o female pt here for 4 wk f/u for PVD OD.  No change in [place] OU.  Denies pain, FOL, floaters.  No gtts.  BS 154 today.  A1C 6.2.       Last edited by Bernarda Caffey, MD on 01/09/2020  2:13 PM. (History)    pt states vision is doing well, she is not seeing the floaters as much now   Referring physician: Debbra Riding, MD [address] [place],  [place] [address]  HISTORICAL INFORMATION:   Selected notes from the MEDICAL RECORD NUMBER Referred to Dr. Coralyn Pear by Dr. Katy Fitch since Dr. Coralyn Pear is on call.  Pt experienced "flickers" of light in OD vision last night for about 5 min.    CURRENT MEDICATIONS: No current outpatient medications on file. (Ophthalmic Drugs)   No current facility-administered medications for this visit. (Ophthalmic Drugs)   Current Outpatient Medications (Other)  Medication Sig  . atorvastatin (LIPITOR) 20 MG tablet Take 20 mg by mouth daily.   . empagliflozin (JARDIANCE) 10 MG TABS tablet Take 10 mg by mouth daily.  . finasteride (PROSCAR) 5 MG tablet Take by mouth.   . gabapentin (NEURONTIN) 100 MG capsule Take 100 mg by mouth 3 (three) times daily.   Marland Kitchen glimepiride (AMARYL) 4 MG tablet Take 4 mg by mouth daily with breakfast.   . HYDROcodone-acetaminophen (NORCO) 10-325 MG tablet Take 1 tablet by mouth daily as needed.   Marland Kitchen levothyroxine (SYNTHROID) 112 MCG tablet Take 112 mcg by mouth daily.  Marland Kitchen levothyroxine (SYNTHROID,  LEVOTHROID) 125 MCG tablet Take 125 mcg by mouth daily.   . metFORMIN (GLUCOPHAGE) 1000 MG tablet Take 1,000 mg by mouth 2 (two) times daily.  . methylPREDNISolone (MEDROL DOSEPAK) 4 MG TBPK tablet Take as directed per package instructions  . metroNIDAZOLE (METROCREAM) 0.75 % cream Apply topically.   . sitaGLIPtan-metformin (JANUMET) 50-500 MG per tablet Take 1 tablet by mouth 2 (two) times daily with a meal.  . sitaGLIPtin (JANUVIA) 100 MG tablet Take 100 mg by mouth daily.  Marland Kitchen amoxicillin (AMOXIL) 500 MG capsule Take 500 mg by mouth 3 (three) times daily.  (Patient not taking: Reported on 01/09/2020)  . fluocinonide (LIDEX) 0.05 % external solution Apply on scalp daily for 2 weeks and stop it . (Patient not taking: Reported on 01/09/2020)   No current facility-administered medications for this visit. (Other)      REVIEW OF SYSTEMS: ROS    Positive for: Musculoskeletal, Endocrine, Eyes   Negative for: Constitutional, Gastrointestinal, Neurological, Skin, Genitourinary, HENT, Cardiovascular, Respiratory, Psychiatric, Allergic/Imm, Heme/Lymph   Last edited by Matthew Folks, COA on 01/09/2020  1:49 PM. (History)       ALLERGIES No Known Allergies  PAST MEDICAL HISTORY Past Medical History:  Diagnosis Date  . Cataract    Mixed form OU  .  Diabetes mellitus   . Fibroid uterus 2001  . H/O fatigue 1998  . Headache(784.0)   . 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 History of chicken pox   . Hypothyroidism   . Menses, irregular 1998   Past Surgical History:  Procedure Laterality Date  . DILATION AND CURETTAGE OF UTERUS  2002   In [place]  . KNEE SURGERY    . TONSILECTOMY, ADENOIDECTOMY, BILATERAL MYRINGOTOMY AND TUBES    . TONSILLECTOMY      FAMILY HISTORY Family History  Problem Relation Age of Onset  . Diabetes Mother   . Diabetes Father   . Heart disease Father        MI  . Diabetes Sister   . Macular degeneration Sister   . Diabetes Brother   . Heart disease Brother         Angioplasty    SOCIAL HISTORY Social History   Tobacco Use  . Smoking status: Never Smoker  . Smokeless tobacco: Never Used  Vaping Use  . Vaping Use: Never used  Substance Use Topics  . Alcohol use: No  . Drug use: No         OPHTHALMIC EXAM:  Base Eye Exam    Visual Acuity (Snellen - Linear)      Right Left   Dist [place] 20/40 -2 20/25   Dist ph [place] 20/20 -2 NI       Tonometry (Tonopen, 1:52 PM)      Right Left   Pressure 22 19       Pupils      Dark Light Shape React APD   Right 3 2 Round Brisk None   Left 3 2 Round Brisk None       Visual Fields (Counting fingers)      Left Right    Full Full       Extraocular Movement      Right Left    Full, Ortho Full, Ortho       Neuro/Psych    Oriented x3: Yes   Mood/Affect: Normal       Dilation    Both eyes: 1.0% Mydriacyl, 2.5% Phenylephrine @ 1:52 PM        Slit Lamp and Fundus Exam    External Exam      Right Left   External rosacea on forehead, nose and upper cheeks rosacea on forehead, nose and upper cheeks       Slit Lamp Exam      Right Left   Lids/Lashes Dermatochalasis - upper lid Dermatochalasis - upper lid, Meibomian gland dysfunction, mild Telangiectasia   Conjunctiva/Sclera White and quiet temporal pinguecula   Cornea Clear Trace Debris in tear film   Anterior Chamber deep, narrow temporal angle deep, narrow temporal angle   Iris Round and dilated; no NVI Round and moderately dilated, No NVI   Lens 2+ Nuclear sclerosis, 2+ Cortical cataract 2+ Nuclear sclerosis, 2+ Cortical cataract   Vitreous Vitreous syneresis, no pigment, Posterior vitreous detachment, vitreous condensations Vitreous syneresis, no pigment       Fundus Exam      Right Left   Disc Pink and Sharp, Compact, focal PPP temporal Pink and Sharp, Compact   C/D Ratio 0.3 0.2   Macula Flat, Good foveal reflex, mild RPE mottling, No heme or edema Flat, Good foveal reflex, mild RPE mottling, mild drusen temporal macula, No  heme or edema   Vessels Vascular attenuation, mild Copper wiring, tortuousity Vascular attenuation, mild Copper wiring,  mild tortuousity   Periphery Attached, mild pigmented cystoid degeneration, no heme; no RT/RD on 360 exam Attached, mild pigmented cystoid degeneration inferiorly, no heme          IMAGING AND PROCEDURES  Imaging and Procedures for 01/09/2020  OCT, Retina - OU - Both Eyes       Right Eye Quality was good. Central Foveal Thickness: 271. Progression has been stable. Findings include normal foveal contour, no IRF, no SRF (Vitreous opacities improved).   Left Eye Quality was good. Central Foveal Thickness: 268. Progression has been stable. Findings include normal foveal contour, no IRF, no SRF.   Notes *Images captured and stored on drive  Diagnosis / Impression:  NFP, no IRF/SRF OU  Clinical management:  See below  Abbreviations: NFP - Normal foveal profile. CME - cystoid macular edema. PED - pigment epithelial detachment. IRF - intraretinal fluid. SRF - subretinal fluid. EZ - ellipsoid zone. ERM - epiretinal membrane. ORA - outer retinal atrophy. ORT - outer retinal tubulation. SRHM - subretinal hyper-reflective material. IRHM - intraretinal hyper-reflective material                 ASSESSMENT/PLAN:    ICD-10-CM   1. Posterior vitreous detachment of right eye  H43.811   2. Retinal edema  H35.81 OCT, Retina - OU - Both Eyes  3. Diabetes mellitus type 2 without retinopathy ([place])  E11.9   4. Combined forms of age-related cataract of both eyes  H25.813     1,2. PVD / vitreous syneresis OD  - symptomatic w/ 5 min episode of flashes OD on 9.16.21  - now resolved  - Discussed findings and prognosis  - No RT or RD on 360 scleral depressed exam  - Reviewed s/s of RT/RD  - Strict return precautions for any such RT/RD signs/symptoms  - pt is cleared from a retina standpoint for release to Dr. Zenia Resides and resumption of primary eye care  - f/u here  PRN  3. Diabetes mellitus, type 2 without retinopathy - The incidence, risk factors for progression, natural history and treatment options for diabetic retinopathy  were discussed with patient.   - The need for close monitoring of blood glucose, blood pressure, and serum lipids, avoiding cigarette or any type of tobacco, and the need for long term follow up was also discussed with patient.  4. Mixed Cataract OU - The symptoms of cataract, surgical options, and treatments and risks were discussed with patient. - discussed diagnosis and progression - under the expert management of Dr. Katy Fitch   Ophthalmic Meds Ordered this visit:  No orders of the defined types were placed in this encounter.      Return if symptoms worsen or fail to improve.  There are no Patient Instructions on file for this visit.  This document serves as a record of services personally performed by Gardiner Sleeper, MD, PhD. It was created on their behalf by Leeann Must, [place], an ophthalmic technician. The creation of this record is the provider's dictation and/or activities during the visit.    Electronically signed by: Leeann Must, [place] 10.07.2021 2:26 PM   This document serves as a record of services personally performed by Gardiner Sleeper, MD, PhD. It was created on their behalf by San Jetty. [place], OA an ophthalmic technician. The creation of this record is the provider's dictation and/or activities during the visit.    Electronically signed by: San Jetty. [place], [place] 10.11.2021 2:26 PM  Gardiner Sleeper, M.D., Ph.D.  Diseases & Surgery of the Retina and Vitreous [facility] 01/09/2020   I have reviewed the above documentation for accuracy and completeness, and I agree with the above. Gardiner Sleeper, M.D., Ph.D. 01/09/20 2:26 PM   Abbreviations: M myopia (nearsighted); A astigmatism; H hyperopia (farsighted); P presbyopia; Mrx spectacle prescription;  CTL contact lenses; OD right eye;  OS left eye; OU both eyes  XT exotropia; ET esotropia; PEK punctate epithelial keratitis; PEE punctate epithelial erosions; DES dry eye syndrome; MGD meibomian gland dysfunction; ATs artificial tears; PFAT's preservative free artificial tears; [place] nuclear sclerotic cataract; PSC posterior subcapsular cataract; ERM epi-retinal membrane; PVD posterior vitreous detachment; RD retinal detachment; DM diabetes mellitus; DR diabetic retinopathy; NPDR non-proliferative diabetic retinopathy; PDR proliferative diabetic retinopathy; CSME clinically significant macular edema; DME diabetic macular edema; dbh dot blot hemorrhages; CWS cotton wool spot; POAG primary open angle glaucoma; C/D cup-to-disc ratio; HVF humphrey visual field; GVF goldmann visual field; OCT optical coherence tomography; IOP intraocular pressure; BRVO Branch retinal vein occlusion; CRVO central retinal vein occlusion; CRAO central retinal artery occlusion; BRAO branch retinal artery occlusion; RT retinal tear; SB scleral buckle; PPV pars plana vitrectomy; VH Vitreous hemorrhage; PRP panretinal laser photocoagulation; IVK intravitreal kenalog; VMT vitreomacular traction; MH Macular hole;  NVD neovascularization of the disc; NVE neovascularization elsewhere; AREDS age related eye disease study; ARMD age related macular degeneration; POAG primary open angle glaucoma; EBMD epithelial/anterior basement membrane dystrophy; ACIOL anterior chamber intraocular lens; IOL intraocular lens; PCIOL posterior chamber intraocular lens; Phaco/IOL phacoemulsification with intraocular lens placement; [place] photorefractive keratectomy; LASIK laser assisted in situ keratomileusis; HTN hypertension; DM diabetes mellitus; COPD chronic obstructive pulmonary disease

## 2020-01-09 ENCOUNTER — Other Ambulatory Visit: Payer: Self-pay

## 2020-01-09 ENCOUNTER — Encounter (INDEPENDENT_AMBULATORY_CARE_PROVIDER_SITE_OTHER): Payer: Self-pay | Admitting: Ophthalmology

## 2020-01-09 ENCOUNTER — Ambulatory Visit (INDEPENDENT_AMBULATORY_CARE_PROVIDER_SITE_OTHER): Payer: Medicare Other | Admitting: Ophthalmology

## 2020-01-09 DIAGNOSIS — H43811 Vitreous degeneration, right eye: Secondary | ICD-10-CM | POA: Diagnosis not present

## 2020-01-09 DIAGNOSIS — E119 Type 2 diabetes mellitus without complications: Secondary | ICD-10-CM

## 2020-01-09 DIAGNOSIS — H3581 Retinal edema: Secondary | ICD-10-CM | POA: Diagnosis not present

## 2020-01-09 DIAGNOSIS — H25813 Combined forms of age-related cataract, bilateral: Secondary | ICD-10-CM

## 2020-06-19 ENCOUNTER — Ambulatory Visit (INDEPENDENT_AMBULATORY_CARE_PROVIDER_SITE_OTHER): Payer: Medicare Other | Admitting: Orthopaedic Surgery

## 2020-06-19 ENCOUNTER — Other Ambulatory Visit: Payer: Self-pay

## 2020-06-19 ENCOUNTER — Encounter: Payer: Self-pay | Admitting: Orthopaedic Surgery

## 2020-06-19 VITALS — Ht 64.0 in | Wt 170.0 lb

## 2020-06-19 DIAGNOSIS — G8929 Other chronic pain: Secondary | ICD-10-CM

## 2020-06-19 DIAGNOSIS — M25562 Pain in left knee: Secondary | ICD-10-CM | POA: Diagnosis not present

## 2020-06-19 DIAGNOSIS — M25561 Pain in right knee: Secondary | ICD-10-CM

## 2020-06-19 DIAGNOSIS — M170 Bilateral primary osteoarthritis of knee: Secondary | ICD-10-CM

## 2020-06-19 NOTE — Progress Notes (Signed)
Office Visit Note   Patient: Krystal Lang           Date of Birth: [birth year]           MRN: [id number] Visit Date: 06/19/2020              Requested by: Dixie Dials, [place],  [place] [address] PCP: Dixie Dials, MD   Assessment & Plan: Visit Diagnoses:  1. Chronic pain of both knees   2. Bilateral primary osteoarthritis of knee     Plan: Mrs. Linson has advanced osteoarthritis of both knees.  Last week she had an acute episode of right knee pain to the point where she had limited range of motion and considerable pain.  She notes that over few days the pain slowly resolved but obviously is concerned about what she can expect over time.  We had a long discussion well over 30 to 40 minutes regarding her diagnosis and treatment options.  She has not had much relief with more recent cortisone injections.  She had an MRI scan of her right knee last year demonstrating advanced osteoarthritis in all 3 compartments.  She is.  She has found that over time she has had more limitations of activity but is not sure that she is ready to have surgery.  I have discussed the knee replacement with her in some detail.  At this point she will continue trying exercises to maintain her strength and over-the-counter medicines.  She would like to have another MRI scan of her knee because of the exacerbation and more limitation of activities.  This will be ordered  Follow-Up Instructions: Return if symptoms worsen or fail to improve.   Orders:  Orders Placed This Encounter  Procedures  . MR Knee Right w/o contrast   No orders of the defined types were placed in this encounter.     Procedures: No procedures performed   Clinical Data: No additional findings.   Subjective: Chief Complaint  Patient presents with  . Left Knee - Pain  . Right Knee - Pain  Patient presents today for follow up on her bilateral knee pain. The right side is worse than the left. She was here in  July of 2021 and had her right knee injected. She states that it did not help and does not want another injection. She said that her knee pain was worse last week after beginning to exercise. She has stopped exercising and states that her knee is feeling better. She takes Advil or Tylenol as needed.  She was really compromised in her activities last week to the point where she was ready to have "something done".  Fortunately the pain resolved but she is just worried that over time she may have progressive compromise of her activities of daily living and even traveling. HPI  Review of Systems   Objective: Vital Signs: Ht 5\' 4"  (1.626 m)   Wt 170 lb (77.1 kg)   BMI 29.18 kg/m   Physical Exam Constitutional:      Appearance: She is well-developed.  Eyes:     Pupils: Pupils are equal, round, and reactive to light.  Pulmonary:     Effort: Pulmonary effort is normal.  Skin:    General: Skin is warm and dry.  Neurological:     Mental Status: She is alert and oriented to person, place, and time.  Psychiatric:        Behavior: Behavior normal.  Ortho Exam right knee was not hot red warm or swollen.  Slight increased varus clinically but full extension.  No effusion.  Positive patella crepitation but no pain with compression.  No popliteal mass.  No calf pain.  Good pulses distally.  No distal edema.  Motor exam intact.  No opening with a varus or valgus stress. Specialty Comments:  No specialty comments available.  Imaging: No results found.   PMFS History: Patient Active Problem List   Diagnosis Date Noted  . Bilateral primary osteoarthritis of knee 05/11/2017  . Dyspareunia, female 11/13/2011  . Type 2 diabetes mellitus ([place]) 11/13/2011  . Alopecia 11/13/2011  . History of chicken pox   . Headache(784.0)   . Hypothyroidism   . Menses, irregular   . Hirsutism   . H/O fatigue   . Fibroid uterus    Past Medical History:  Diagnosis Date  . Cataract    Mixed form OU  .  Diabetes mellitus   . Fibroid uterus 2001  . H/O fatigue 1998  . Headache(784.0)   . 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 History of chicken pox   . Hypothyroidism   . Menses, irregular 1998    Family History  Problem Relation Age of Onset  . Diabetes Mother   . Diabetes Father   . Heart disease Father        MI  . Diabetes Sister   . Macular degeneration Sister   . Diabetes Brother   . Heart disease Brother        Angioplasty    Past Surgical History:  Procedure Laterality Date  . DILATION AND CURETTAGE OF UTERUS  2002   In [place]  . KNEE SURGERY    . TONSILECTOMY, ADENOIDECTOMY, BILATERAL MYRINGOTOMY AND TUBES    . TONSILLECTOMY     Social History   Occupational History  . Not on file  Tobacco Use  . Smoking status: Never Smoker  . Smokeless tobacco: Never Used  Vaping Use  . Vaping Use: Never used  Substance and Sexual Activity  . Alcohol use: No  . Drug use: No  . Sexual activity: Yes    Birth control/protection: Post-menopausal

## 2020-07-11 ENCOUNTER — Other Ambulatory Visit: Payer: Medicare Other

## 2020-09-05 ENCOUNTER — Encounter: Payer: Self-pay | Admitting: Orthopaedic Surgery

## 2021-02-28 HISTORY — PX: COLONOSCOPY: SHX174

## 2021-05-01 ENCOUNTER — Ambulatory Visit (INDEPENDENT_AMBULATORY_CARE_PROVIDER_SITE_OTHER): Payer: Medicare Other | Admitting: Orthopaedic Surgery

## 2021-05-01 ENCOUNTER — Other Ambulatory Visit: Payer: Self-pay

## 2021-05-01 ENCOUNTER — Encounter: Payer: Self-pay | Admitting: Orthopaedic Surgery

## 2021-05-01 DIAGNOSIS — M170 Bilateral primary osteoarthritis of knee: Secondary | ICD-10-CM

## 2021-05-01 NOTE — Progress Notes (Signed)
Office Visit Note   Patient: Krystal Lang           Date of Birth: 11-27-1955           MRN: [id number] Visit Date: 05/01/2021              Requested by: Dixie Dials, [place],   [id number] PCP: Dixie Dials, MD   Assessment & Plan: Visit Diagnoses:  1. Bilateral primary osteoarthritis of knee     Plan: [name] has been followed for several years in regards of the bilateral knee osteoarthritis.  She has had courses of viscosupplementation as well as cortisone with some temporary relief.  For the past year she has had more difficulty with the right knee with limited range of motion and pain.  She has decided that she would like to proceed with a total knee replacement.  They found a surgeon in [facility] who performs the surgery with robotic control and they have scheduled the surgery with him the first week in March.  She came to the office  to discuss the surgery and what she can do preoperatively to improve her motion and had lots of questions regarding the surgery, rehab and what she may expect postoperatively.  I spent almost an hour discussing all the above generically as best I could not knowing exactly what prosthesis was being used or the type of robotic control.  I do think she needs to be working on strength and would suggest that she work out at [employer] on a daily basis up until surgery and even try some physical therapy which I will order. Answered all of her questions and will plan to see her back as needed. She had inquired about rehab postoperatively and I thought she would be better off either at home with a family member rather than rehab this is a greater risk of infection and probably less independence  Follow-Up Instructions: Return if symptoms worsen or fail to improve.   Orders:  No orders of the defined types were placed in this encounter.  No orders of the defined types were placed in this encounter.      Procedures: No procedures performed   Clinical Data: No additional findings.   Subjective: Chief Complaint  Patient presents with   Right Knee - Pain  Patient presents today for her right knee. She states that she has seen Dr.Shana Zavaleta for her right knee before. She is scheduled to have a total knee arthroplasty in March of this year with a doctor in [place]. She wants to talk about the surgery. She also wants to talk about physical therapy before the surgery. She brought a disc with her x-rays to the office today.   HPI  Review of Systems   Objective: Vital Signs: There were no vitals taken for this visit.  Physical Exam Constitutional:      Appearance: She is well-developed.  Eyes:     Pupils: Pupils are equal, round, and reactive to light.  Pulmonary:     Effort: Pulmonary effort is normal.  Skin:    General: Skin is warm and dry.  Neurological:     Mental Status: She is alert and oriented to person, place, and time.  Psychiatric:        Behavior: Behavior normal.    Ortho Exam right knee with small effusion.  She lacks a few degrees to full extension and only had 100 degrees of flexion.  No instability.  There is more medial and lateral joint pain.  The was no popliteal pain or mass but she has experienced some pain posteriorly.  No instability.  No patellar apprehension..  Minimal patellar crepitation no pain with compression.  Painless range of motion of both hips.  Motor exam intact.  Not using any ambulatory  Specialty Comments:  No specialty comments available.  Imaging: No results found.   PMFS History: Patient Active Problem List   Diagnosis Date Noted   Bilateral primary osteoarthritis of knee 05/11/2017   Dyspareunia, female 11/13/2011   Type 2 diabetes mellitus ([place]) 11/13/2011   Alopecia 11/13/2011   History of chicken pox    Headache(784.0)    Hypothyroidism    Menses, irregular    Hirsutism    H/O fatigue    Fibroid uterus    Past Medical  History:  Diagnosis Date   Cataract    Mixed form OU   Diabetes mellitus    Fibroid uterus 2001   H/O fatigue 1998   Headache(784.0)    Hirsutism 1998   Idiopathic   History of chicken pox    Hypothyroidism    Menses, irregular 1998    Family History  Problem Relation Age of Onset   Diabetes Mother    Diabetes Father    Heart disease Father        MI   Diabetes Sister    Macular degeneration Sister    Diabetes Brother    Heart disease Brother        Angioplasty    Past Surgical History:  Procedure Laterality Date   DILATION AND CURETTAGE OF UTERUS  2002   In [place]   KNEE SURGERY     TONSILECTOMY, ADENOIDECTOMY, BILATERAL MYRINGOTOMY AND TUBES     TONSILLECTOMY     Social History   Occupational History   Not on file  Tobacco Use   Smoking status: Never   Smokeless tobacco: Never  Vaping Use   Vaping Use: Never used  Substance and Sexual Activity   Alcohol use: No   Drug use: No   Sexual activity: Yes    Birth control/protection: Post-menopausal

## 2021-05-08 ENCOUNTER — Other Ambulatory Visit: Payer: Self-pay

## 2021-05-08 ENCOUNTER — Ambulatory Visit (INDEPENDENT_AMBULATORY_CARE_PROVIDER_SITE_OTHER): Payer: Medicare Other | Admitting: Physical Therapy

## 2021-05-08 DIAGNOSIS — M25561 Pain in right knee: Secondary | ICD-10-CM

## 2021-05-08 DIAGNOSIS — R2689 Other abnormalities of gait and mobility: Secondary | ICD-10-CM

## 2021-05-08 DIAGNOSIS — M6281 Muscle weakness (generalized): Secondary | ICD-10-CM | POA: Diagnosis not present

## 2021-05-08 DIAGNOSIS — G8929 Other chronic pain: Secondary | ICD-10-CM

## 2021-05-08 DIAGNOSIS — M25661 Stiffness of right knee, not elsewhere classified: Secondary | ICD-10-CM | POA: Diagnosis not present

## 2021-05-08 NOTE — Therapy (Signed)
OUTPATIENT PHYSICAL THERAPY LOWER EXTREMITY EVALUATION   Patient Name: Krystal Lang MRN: [id number] DOB:1955-08-18, 66 y.o., female Today's Date: 05/08/2021   PT End of Session - 05/08/21 1033     Visit Number 1    Number of Visits 4    Date for PT Re-Evaluation 06/05/21    Authorization Type Medicare    Progress Note Due on Visit 10    PT Start Time 0845    PT Stop Time 0930    PT Time Calculation (min) 45 min    Activity Tolerance Patient tolerated treatment well;Patient limited by pain    Behavior During Therapy WFL for tasks assessed/performed             Past Medical History:  Diagnosis Date   Cataract    Mixed form OU   Diabetes mellitus    Fibroid uterus 2001   H/O fatigue 1998   Headache(784.0)    Hirsutism 1998   Idiopathic   History of chicken pox    Hypothyroidism    Menses, irregular 1998   Past Surgical History:  Procedure Laterality Date   DILATION AND CURETTAGE OF UTERUS  2002   In [place]   KNEE SURGERY     TONSILECTOMY, ADENOIDECTOMY, BILATERAL MYRINGOTOMY AND TUBES     TONSILLECTOMY     Patient Active Problem List   Diagnosis Date Noted   Bilateral primary osteoarthritis of knee 05/11/2017   Dyspareunia, female 11/13/2011   Type 2 diabetes mellitus ([place]) 11/13/2011   Alopecia 11/13/2011   History of chicken pox    Headache(784.0)    Hypothyroidism    Menses, irregular    Hirsutism    H/O fatigue    Fibroid uterus     PCP: Dixie Dials, MD  REFERRING PROVIDER: Garald Balding, MD  REFERRING DIAG: M17.0 (ICD-10-CM) - Bilateral primary osteoarthritis of knee  THERAPY DIAG:  Chronic pain of right knee  Stiffness of right knee, not elsewhere classified  Other abnormalities of gait and mobility  Muscle weakness (generalized)  ONSET DATE: "Many years ago"   SUBJECTIVE:   SUBJECTIVE STATEMENT: Pt. Presents to PT with increased pain and stiffness in the R knee. Pt. Is scheduled to have a R TKA on 06/05/2021. She reports  wanting to get her knee stronger and less stiff before she has this surgery. She states she is also planning to have a L TKA as well in 6 months.   PERTINENT HISTORY: Type 2 diabetes mellitus ([place]), Bilateral primary osteoarthritis of knee  PAIN:  Are you having pain? Yes NPRS scale: 7-8/10 (with activity) Pain location: R knee Pain orientation: Posterior  Pain description: dull and aching  Aggravating factors: Standing, Climbing down stairs, Squatting  Relieving factors: Aleve helps sometimes  PRECAUTIONS: None  WEIGHT BEARING RESTRICTIONS No  FALLS:  Has patient fallen in last 6 months? No, Number of falls: 0 Pt. does have a fear of falling with uneven surfaces.   LIVING ENVIRONMENT: Lives with: lives with their family Lives in: House/apartment Stairs: Yes; Internal: 15 steps; on right going up Has following equipment at home: None  OCCUPATION: Medical Office Receptionist  PLOF: Independent  PATIENT GOALS: More R knee flexibility, without pain   OBJECTIVE:   DIAGNOSTIC FINDINGS: MRI of R knee  "Patellofemoral: High-grade partial-thickness cartilage loss of the patellofemoral compartment." "Medial: High-grade partial-thickness cartilage loss with areas of full-thickness cartilage loss of the medial femorotibial compartment with subchondral reactive marrow changes. Marginal osteophytes." "Lateral: Partial thickness cartilage loss of the  lateral femorotibial compartment with marginal osteophytes."  PATIENT SURVEYS:  FOTO 47%  COGNITION:  Overall cognitive status: Within functional limits for tasks assessed     SENSATION: Proprioception: Appears intact  MUSCLE LENGTH: Not completed at today's visit.   POSTURE:  Walks with slight limp in R knee  PALPATION: Edema of R knee anteriorly and posteriorly  LE AROM/PROM:  A/PROM Right 05/08/2021 Left 05/08/2021  Knee flexion AROM: 85 deg. PROM: 87 deg.  AROM:  110 deg. PROM:  115 deg.  Knee extension A/PROM:  -2 deg.  A/PROM: 0 deg.    (Blank rows = not tested)  LE MMT:  MMT Right 05/08/2021 Left 05/08/2021  Hip flexion (measured in sitting)  4-/5 4/5  Hip abduction 4+/5 4+/5  Knee flexion 4/5 (in pt's PROM 3+/5  Knee extension 4/5  4/5   (Blank rows = not tested)   FUNCTIONAL TESTS:  5 times sit to stand: 9.78s  without UE support   TODAY'S TREATMENT: Discussed with pt. recovery time and details regarding upcoming R TKA surgery. Reviewed pt's HEP and demonstrated usage of gym equipment.    PATIENT EDUCATION:  Education details: HEP, overview of expected surgery recovery and gym equipment  Person educated: Patient Education method: Demonstration, Verbal cues, and Handouts Education comprehension: verbalized understanding, returned demonstration, verbal cues required, and needs further education   HOME EXERCISE PROGRAM: Access Code: [email or web address] URL: [email or web address] Date: 05/08/2021 Prepared by: Elsie Ra  Exercises Supine Quadriceps Stretch with Strap on Table - 2 x daily - 6 x weekly - 2-3 reps - 30 hold Seated Hamstring Stretch - 2 x daily - 6 x weekly - 1 sets - 3 reps - 30 hold Long Sitting Quad Set with Towel Roll Under Heel - 2 x daily - 6 x weekly - 3 sets - 10 reps Mini Squat with Counter Support - 2 x daily - 6 x weekly - 1-2 sets - 10 reps - 3 sec. hold Full Leg Press - 2 x daily - 6 x weekly - 2-3 sets - 8 reps Hamstring Curl with Weight Machine - 2 x daily - 6 x weekly - 2-3 sets - 10 reps Knee Extension with Weight Machine - 2 x daily - 6 x weekly - 2-3 sets - 10 reps Recumbent Bike - 2 x daily - 6 x weekly - 3 sets - 10 reps   ASSESSMENT:  CLINICAL IMPRESSION: Patient presents with signs and symptoms consistent with R knee pain and stiffness. Objective impairments include decreased activity tolerance, difficulty walking, decreased balance, decreased endurance, decreased mobility, decreased ROM, decreased strength, impaired flexibility,  impaired LE use, postural dysfunction, and pain. These impairments are limiting patient from activity/participation in bending, lifting, carry, locomotion, cleaning, community activity, driving, and or occupation. Personal factors and comorbidities including Type 2 diabetes mellitus ([place]), Bilateral primary osteoarthritis of knee are also affecting patient's functional outcome. Patient will benefit from skilled PT to address above impairments and improve overall function.  REHAB POTENTIAL: Good  CLINICAL DECISION MAKING: stable/uncomplicated  EVALUATION COMPLEXITY: Low   GOALS: Long term PT goals (target dates for all long term goals are 4 weeks 06/05/21) Pt will improve R knee flexion ROM 10 deg.  Baseline: 85 deg.  Goal status: New Pt will improve  hip/knee strength to at least 4+/5 MMT to improve functional strength Baseline: Goal status: New Pt will improve FOTO to at least 51% functional to show improved function (did not write goal for full predicted value as she will need  surgery) Baseline: 47% now Goal status: New Pt will reduce pain by overall 25% overall with usual activity Baseline: Goal status: New  PLAN: PT FREQUENCY: 1 time per week   PT DURATION: 4 weeks  PLANNED INTERVENTIONS (unless contraindicated): aquatic PT, , cryotherapy, Electrical stimulation, Iontophoresis with 4 mg/ml dexamethasome, Moist heat, traction, Ultrasound, gait training, Therapeutic exercise, balance training, neuromuscular re-education, patient/family education,  manual techniques, passive ROM, dry needling, taping, vasopnuematic device,  joint manipulations  PLAN FOR NEXT SESSION: Reassess HEP, emphasize stair navigation for after pt's surgery    Jasslyn Finkel Singer, Student-PT 05/08/2021, 11:11 AM

## 2021-05-13 ENCOUNTER — Ambulatory Visit: Payer: Medicare Other | Admitting: Physical Therapy

## 2021-05-15 ENCOUNTER — Ambulatory Visit (INDEPENDENT_AMBULATORY_CARE_PROVIDER_SITE_OTHER): Payer: Medicare Other | Admitting: Physical Therapy

## 2021-05-15 ENCOUNTER — Other Ambulatory Visit: Payer: Self-pay

## 2021-05-15 ENCOUNTER — Encounter: Payer: Self-pay | Admitting: Physical Therapy

## 2021-05-15 DIAGNOSIS — R2689 Other abnormalities of gait and mobility: Secondary | ICD-10-CM

## 2021-05-15 DIAGNOSIS — M25561 Pain in right knee: Secondary | ICD-10-CM

## 2021-05-15 DIAGNOSIS — M6281 Muscle weakness (generalized): Secondary | ICD-10-CM | POA: Diagnosis not present

## 2021-05-15 DIAGNOSIS — M25661 Stiffness of right knee, not elsewhere classified: Secondary | ICD-10-CM

## 2021-05-15 DIAGNOSIS — G8929 Other chronic pain: Secondary | ICD-10-CM

## 2021-05-15 NOTE — Therapy (Deleted)
.  oprcpttre

## 2021-05-15 NOTE — Therapy (Signed)
OUTPATIENT PHYSICAL THERAPY TREATMENT NOTE   Patient Name: Krystal Lang MRN: [id number] DOB:07-Jul-1955, 66 y.o., female Today's Date: 05/15/2021  PCP: Krystal Dials, MD REFERRING PROVIDER: Garald Balding, MD   PT End of Session - 05/15/21 1317     Visit Number 2    Number of Visits 4    Date for PT Re-Evaluation 06/05/21    Authorization Type Medicare    Progress Note Due on Visit 10    PT Start Time 3086    PT Stop Time 1400    PT Time Calculation (min) 43 min    Activity Tolerance Patient tolerated treatment well;Patient limited by pain    Behavior During Therapy WFL for tasks assessed/performed             Past Medical History:  Diagnosis Date   Cataract    Mixed form OU   Diabetes mellitus    Fibroid uterus 2001   H/O fatigue 1998   Headache(784.0)    Hirsutism 1998   Idiopathic   History of chicken pox    Hypothyroidism    Menses, irregular 1998   Past Surgical History:  Procedure Laterality Date   DILATION AND CURETTAGE OF UTERUS  2002   In [place]   KNEE SURGERY     TONSILECTOMY, ADENOIDECTOMY, BILATERAL MYRINGOTOMY AND TUBES     TONSILLECTOMY     Patient Active Problem List   Diagnosis Date Noted   Bilateral primary osteoarthritis of knee 05/11/2017   Dyspareunia, female 11/13/2011   Type 2 diabetes mellitus ([place]) 11/13/2011   Alopecia 11/13/2011   History of chicken pox    Headache(784.0)    Hypothyroidism    Menses, irregular    Hirsutism    H/O fatigue    Fibroid uterus     REFERRING DIAG: M17.0 (ICD-10-CM) - Bilateral primary osteoarthritis of knee   THERAPY DIAG:  Chronic pain of right knee  Stiffness of right knee, not elsewhere classified  Other abnormalities of gait and mobility  Muscle weakness (generalized)  PERTINENT HISTORY: Type 2 diabetes mellitus ([place]), Bilateral primary osteoarthritis of knee   PRECAUTIONS: none  SUBJECTIVE: Patient 15 min late. Pain in post knee.   PAIN:  Are you having pain? Yes NPRS  scale: 6/10 Pain location: right knee Pain orientation: Right and Posterior  PAIN TYPE: aching Pain description: intermittent  Aggravating factors: bending Relieving factors: rest   OBJECTIVE:    DIAGNOSTIC FINDINGS: MRI of R knee  "Patellofemoral: High-grade partial-thickness cartilage loss of the patellofemoral compartment." "Medial: High-grade partial-thickness cartilage loss with areas of full-thickness cartilage loss of the medial femorotibial compartment with subchondral reactive marrow changes. Marginal osteophytes." "Lateral: Partial thickness cartilage loss of the lateral femorotibial compartment with marginal osteophytes."   PATIENT SURVEYS:  05/08/21: FOTO 47%     LE AROM/PROM:   A/PROM Right 05/08/2021 Left 05/08/2021 Right  05/15/21 Left 05/15/2021  Knee flexion AROM: 85 deg. PROM: 87 deg.  AROM:  110 deg. PROM:  115 deg. AROM:  87 deg. AROM:  125 deg.  Knee extension A/PROM: -2 deg.  A/PROM: 0 deg.  A/PROM:  -7 deg.  A/PROM:  -4 deg.   (Blank rows = not tested)   LE MMT:   MMT Right 05/08/2021 Left 05/08/2021  Hip flexion (measured in sitting)  4-/5 4/5  Hip abduction 4+/5 4+/5  Knee flexion 4/5 (in pt's PROM 3+/5  Knee extension 4/5  4/5   (Blank rows = not tested)     FUNCTIONAL TESTS:  05/08/21: 5 times sit to stand: 9.78s  without UE support     TODAY'S TREATMENT: Aerobic: Nustep Seat 8 to 6 x 5 min with UE assist Machines: ext 15# x 20  flex 25# x 20 Functional squat with chair target x 10 and blue foam in chair Long sitting quad set  with towel under ankle 5 sec hold x 15 Seated HS stretch 2x 30 sec R Supine Quadriceps Stretch with Strap on Table 2 x 30 sec; VCs to engage abdominals to avoid LBP.      PATIENT EDUCATION:  Education details: HEP reviewed and reprinted Person educated: Patient Education method: Media planner, Verbal cues, and Handouts Education comprehension: verbalized understanding, returned demonstration, verbal cues  required, and needs further education     HOME EXERCISE PROGRAM: Access Code: [email or web address] URL: [email or web address] Date: 05/08/2021 Prepared by: Krystal Lang   Exercises Supine Quadriceps Stretch with Strap on Table - 2 x daily - 6 x weekly - 2-3 reps - 30 hold Seated Hamstring Stretch - 2 x daily - 6 x weekly - 1 sets - 3 reps - 30 hold Long Sitting Quad Set with Towel Roll Under Heel - 2 x daily - 6 x weekly - 3 sets - 10 reps Mini Squat with Counter Support - 2 x daily - 6 x weekly - 1-2 sets - 10 reps - 3 sec. hold Full Leg Press - 2 x daily - 6 x weekly - 2-3 sets - 8 reps Hamstring Curl with Weight Machine - 2 x daily - 6 x weekly - 2-3 sets - 10 reps Knee Extension with Weight Machine - 2 x daily - 6 x weekly - 2-3 sets - 10 reps Recumbent Bike - 2 x daily - 6 x weekly - 3 sets - 10 reps     ASSESSMENT:   CLINICAL IMPRESSION: Krystal Lang was 15 min late so we just reviewed her HEP which she tolerated well. She is afraid of the pain so she eases into the exercise slowly until sure it is tolerable. Significant improvement in left knee flexion today. Bil decrease in extension.   REHAB POTENTIAL: Good   CLINICAL DECISION MAKING: stable/uncomplicated   EVALUATION COMPLEXITY: Low     GOALS: Long term PT goals (target dates for all long term goals are 4 weeks 06/05/21) Pt will improve R knee flexion ROM 10 deg.  Baseline: 85 deg.  Goal status: New Pt will improve  hip/knee strength to at least 4+/5 MMT to improve functional strength Baseline: Goal status: New Pt will improve FOTO to at least 51% functional to show improved function (did not write goal for full predicted value as she will need surgery) Baseline: 47% now Goal status: New Pt will reduce pain by overall 25% overall with usual activity Baseline: Goal status: New   PLAN: PT FREQUENCY: 1 time per week    PT DURATION: 4 weeks   PLANNED INTERVENTIONS (unless contraindicated): aquatic PT, ,  cryotherapy, Electrical stimulation, Iontophoresis with 4 mg/ml dexamethasome, Moist heat, traction, Ultrasound, gait training, Therapeutic exercise, balance training, neuromuscular re-education, patient/family education,  manual techniques, passive ROM, dry needling, taping, vasopnuematic device,  joint manipulations   PLAN FOR NEXT SESSION: emphasize stair navigation for after pt's surgery     Carlethia Mesquita, PT 05/15/2021, 2:07 PM

## 2021-05-21 ENCOUNTER — Encounter: Payer: Medicare Other | Admitting: Physical Therapy

## 2021-05-28 ENCOUNTER — Ambulatory Visit (INDEPENDENT_AMBULATORY_CARE_PROVIDER_SITE_OTHER): Payer: Medicare Other | Admitting: Physical Therapy

## 2021-05-28 ENCOUNTER — Other Ambulatory Visit: Payer: Self-pay

## 2021-05-28 ENCOUNTER — Encounter: Payer: Self-pay | Admitting: Physical Therapy

## 2021-05-28 DIAGNOSIS — M6281 Muscle weakness (generalized): Secondary | ICD-10-CM | POA: Diagnosis not present

## 2021-05-28 DIAGNOSIS — M25661 Stiffness of right knee, not elsewhere classified: Secondary | ICD-10-CM | POA: Diagnosis not present

## 2021-05-28 DIAGNOSIS — R2689 Other abnormalities of gait and mobility: Secondary | ICD-10-CM

## 2021-05-28 DIAGNOSIS — M25561 Pain in right knee: Secondary | ICD-10-CM | POA: Diagnosis not present

## 2021-05-28 DIAGNOSIS — G8929 Other chronic pain: Secondary | ICD-10-CM

## 2021-05-28 NOTE — Therapy (Deleted)
OUTPATIENT PHYSICAL THERAPY TREATMENT NOTE   Patient Name: Krystal Lang MRN: [id number] DOB:1955/06/18, 66 y.o., female Today's Date: 05/28/2021  PCP: Dixie Dials, MD REFERRING PROVIDER: Garald Balding, MD     Past Medical History:  Diagnosis Date   Cataract    Mixed form OU   Diabetes mellitus    Fibroid uterus 2001   H/O fatigue 1998   Headache(784.0)    Hirsutism 1998   Idiopathic   History of chicken pox    Hypothyroidism    Menses, irregular 1998   Past Surgical History:  Procedure Laterality Date   DILATION AND CURETTAGE OF UTERUS  2002   In [place]   KNEE SURGERY     TONSILECTOMY, ADENOIDECTOMY, BILATERAL MYRINGOTOMY AND TUBES     TONSILLECTOMY     Patient Active Problem List   Diagnosis Date Noted   Bilateral primary osteoarthritis of knee 05/11/2017   Dyspareunia, female 11/13/2011   Type 2 diabetes mellitus ([place]) 11/13/2011   Alopecia 11/13/2011   History of chicken pox    Headache(784.0)    Hypothyroidism    Menses, irregular    Hirsutism    H/O fatigue    Fibroid uterus     REFERRING DIAG: M17.0 (ICD-10-CM) - Bilateral primary osteoarthritis of knee   THERAPY DIAG:  Chronic pain of right knee  Stiffness of right knee, not elsewhere classified  Other abnormalities of gait and mobility  Muscle weakness (generalized)  PERTINENT HISTORY: Type 2 diabetes mellitus ([place]), Bilateral primary osteoarthritis of knee   PRECAUTIONS: none  SUBJECTIVE: Patient 15 min late. Pain in post knee.   PAIN:  Are you having pain? Yes NPRS scale: 6/10 Pain location: right knee Pain orientation: Right and Posterior  PAIN TYPE: aching Pain description: intermittent  Aggravating factors: bending Relieving factors: rest   OBJECTIVE:    DIAGNOSTIC FINDINGS: MRI of R knee  "Patellofemoral: High-grade partial-thickness cartilage loss of the patellofemoral compartment." "Medial: High-grade partial-thickness cartilage loss with areas  of full-thickness cartilage loss of the medial femorotibial compartment with subchondral reactive marrow changes. Marginal osteophytes." "Lateral: Partial thickness cartilage loss of the lateral femorotibial compartment with marginal osteophytes."   PATIENT SURVEYS:  05/08/21: FOTO 47%     LE AROM/PROM:   A/PROM Right 05/08/2021 Left 05/08/2021 Right  05/15/21 Left 05/15/2021  Knee flexion AROM: 85 deg. PROM: 87 deg.  AROM:  110 deg. PROM:  115 deg. AROM:  87 deg. AROM:  125 deg.  Knee extension A/PROM: -2 deg.  A/PROM: 0 deg.  A/PROM:  -7 deg.  A/PROM:  -4 deg.   (Blank rows = not tested)   LE MMT:   MMT Right 05/08/2021 Left 05/08/2021  Hip flexion (measured in sitting)  4-/5 4/5  Hip abduction 4+/5 4+/5  Knee flexion 4/5 (in pt's PROM 3+/5  Knee extension 4/5  4/5   (Blank rows = not tested)     FUNCTIONAL TESTS:  05/08/21: 5 times sit to stand: 9.78s  without UE support     TODAY'S TREATMENT: Aerobic: Nustep Seat 8 to 6 x 5 min with UE assist Machines: ext 15# x 20  flex 25# x 20 Functional squat with chair target x 10 and blue foam in chair Long sitting quad set  with towel under ankle 5 sec hold x 15 Seated HS stretch 2x 30 sec R Supine Quadriceps Stretch with Strap on Table 2 x 30 sec; VCs to engage abdominals to avoid LBP.      PATIENT EDUCATION:  Education  details: HEP reviewed and reprinted Person educated: Patient Education method: Demonstration, Verbal cues, and Handouts Education comprehension: verbalized understanding, returned demonstration, verbal cues required, and needs further education     HOME EXERCISE PROGRAM: Access Code: [email or web address] URL: [email or web address] Date: 05/08/2021 Prepared by: Elsie Ra   Exercises Supine Quadriceps Stretch with Strap on Table - 2 x daily - 6 x weekly - 2-3 reps - 30 hold Seated Hamstring Stretch - 2 x daily - 6 x weekly - 1 sets - 3 reps - 30 hold Long Sitting Quad Set with Towel Roll Under  Heel - 2 x daily - 6 x weekly - 3 sets - 10 reps Mini Squat with Counter Support - 2 x daily - 6 x weekly - 1-2 sets - 10 reps - 3 sec. hold Full Leg Press - 2 x daily - 6 x weekly - 2-3 sets - 8 reps Hamstring Curl with Weight Machine - 2 x daily - 6 x weekly - 2-3 sets - 10 reps Knee Extension with Weight Machine - 2 x daily - 6 x weekly - 2-3 sets - 10 reps Recumbent Bike - 2 x daily - 6 x weekly - 3 sets - 10 reps     ASSESSMENT:   CLINICAL IMPRESSION: Krystal Lang was 15 min late so we just reviewed her HEP which she tolerated well. She is afraid of the pain so she eases into the exercise slowly until sure it is tolerable. Significant improvement in left knee flexion today. Bil decrease in extension.   REHAB POTENTIAL: Good   CLINICAL DECISION MAKING: stable/uncomplicated   EVALUATION COMPLEXITY: Low     GOALS: Long term PT goals (target dates for all long term goals are 4 weeks 06/05/21) Pt will improve R knee flexion ROM 10 deg.  Baseline: 85 deg.  Goal status: New Pt will improve  hip/knee strength to at least 4+/5 MMT to improve functional strength Baseline: Goal status: New Pt will improve FOTO to at least 51% functional to show improved function (did not write goal for full predicted value as she will need surgery) Baseline: 47% now Goal status: New Pt will reduce pain by overall 25% overall with usual activity Baseline: Goal status: New   PLAN: PT FREQUENCY: 1 time per week    PT DURATION: 4 weeks   PLANNED INTERVENTIONS (unless contraindicated): aquatic PT, , cryotherapy, Electrical stimulation, Iontophoresis with 4 mg/ml dexamethasome, Moist heat, traction, Ultrasound, gait training, Therapeutic exercise, balance training, neuromuscular re-education, patient/family education,  manual techniques, passive ROM, dry needling, taping, vasopnuematic device,  joint manipulations   PLAN FOR NEXT SESSION: emphasize stair navigation for after pt's surgery     Deepti Gunawan Singer,  Student-PT 05/28/2021, 1:05 PM

## 2021-05-28 NOTE — Therapy (Signed)
OUTPATIENT PHYSICAL THERAPY TREATMENT NOTE   Patient Name: NATHALYA WOLANSKI MRN: [id number] DOB:[birth year], 66 y.o., female Today's Date: 05/28/2021  PCP: Dixie Dials, MD REFERRING PROVIDER: Garald Balding, MD   PT End of Session - 05/28/21 1531     Visit Number 3    Number of Visits 4    Date for PT Re-Evaluation 06/05/21    Authorization Type Medicare    Progress Note Due on Visit 10    PT Start Time 0100    PT Stop Time 0145    PT Time Calculation (min) 45 min    Activity Tolerance Patient tolerated treatment well;Patient limited by pain    Behavior During Therapy [facility] for tasks assessed/performed              Past Medical History:  Diagnosis Date   Cataract    Mixed form OU   Diabetes mellitus    Fibroid uterus 2001   H/O fatigue 1998   Headache(784.0)    Hirsutism 1998   Idiopathic   History of chicken pox    Hypothyroidism    Menses, irregular 1998   Past Surgical History:  Procedure Laterality Date   DILATION AND CURETTAGE OF UTERUS  2002   In [place]   KNEE SURGERY     TONSILECTOMY, ADENOIDECTOMY, BILATERAL MYRINGOTOMY AND TUBES     TONSILLECTOMY     Patient Active Problem List   Diagnosis Date Noted   Bilateral primary osteoarthritis of knee 05/11/2017   Dyspareunia, female 11/13/2011   Type 2 diabetes mellitus ([place]) 11/13/2011   Alopecia 11/13/2011   History of chicken pox    Headache(784.0)    Hypothyroidism    Menses, irregular    Hirsutism    H/O fatigue    Fibroid uterus     REFERRING DIAG: M17.0 (ICD-10-CM) - Bilateral primary osteoarthritis of knee   THERAPY DIAG:  Chronic pain of right knee  Stiffness of right knee, not elsewhere classified  Other abnormalities of gait and mobility  Muscle weakness (generalized)  PERTINENT HISTORY: Type 2 diabetes mellitus ([place]), Bilateral primary osteoarthritis of knee   PRECAUTIONS: none  SUBJECTIVE: Pt states that her Lt knee is beginning to have the same pain, her Rt knee is  experiencing. She reports that she is pushing herself to much with the HEP. Pt had multiple questions regarding her upcoming Rt knee TKA next week regarding pain, recovery time and physical therapy after the surgery. Pt also states wanting to have home health PT every day after surgery, and wanted advice on who can do this.   PAIN:  Are you having pain? Yes NPRS scale: Moderate Pain location: right knee Pain orientation: Right and Posterior  PAIN TYPE: aching Pain description: intermittent  Aggravating factors: bending Relieving factors: rest   OBJECTIVE:    DIAGNOSTIC FINDINGS: MRI of R knee  "Patellofemoral: High-grade partial-thickness cartilage loss of the patellofemoral compartment." "Medial: High-grade partial-thickness cartilage loss with areas of full-thickness cartilage loss of the medial femorotibial compartment with subchondral reactive marrow changes. Marginal osteophytes." "Lateral: Partial thickness cartilage loss of the lateral femorotibial compartment with marginal osteophytes."   PATIENT SURVEYS:  05/08/21: FOTO 47%     LE AROM/PROM:   A/PROM Right 05/08/2021 Left 05/08/2021 Right  05/15/21 Left 05/15/2021  Knee flexion AROM: 85 deg. PROM: 87 deg.  AROM:  110 deg. PROM:  115 deg. AROM:  87 deg. AROM:  125 deg.  Knee extension A/PROM: -2 deg.  A/PROM: 0 deg.  A/PROM:  -7  deg.  A/PROM:  -4 deg.   (Blank rows = not tested)   LE MMT:   MMT Right 05/08/2021 Left 05/08/2021  Hip flexion (measured in sitting)  4-/5 4/5  Hip abduction 4+/5 4+/5  Knee flexion 4/5 (in pt's PROM 3+/5  Knee extension 4/5  4/5   (Blank rows = not tested)     FUNCTIONAL TESTS:  05/08/21: 5 times sit to stand: 9.78s  without UE support     05/28/2021: Therapeutic Exercise:  Aerobic: Nustep Lvl 4.0 x 5 minutes Supine: Tailgaters stretch Rt knee x 5 Prone:  Seated: Leg press DL 3 x 10 25#   Standing: Gastroc stretch 3 x 30 sec. bilat Neuromuscular Re-education: Manual  Therapy: Therapeutic Activity: Self Care: Trigger Point Dry Needling:  Modalities:     TODAY'S TREATMENT:  05/15/2021 Aerobic: Nustep Seat 8 to 6 x 5 min with UE assist Machines: ext 15# x 20  flex 25# x 20 Functional squat with chair target x 10 and blue foam in chair Long sitting quad set  with towel under ankle 5 sec hold x 15 Seated HS stretch 2x 30 sec R Supine Quadriceps Stretch with Strap on Table 2 x 30 sec; VCs to engage abdominals to avoid LBP.      PATIENT EDUCATION:  Education details: HEP reviewed and reprinted Person educated: Patient Education method: Media planner, Verbal cues, and Handouts Education comprehension: verbalized understanding, returned demonstration, verbal cues required, and needs further education     HOME EXERCISE PROGRAM: Access Code: [email or web address] URL: [email or web address] Date: 05/28/2021 Prepared by: Elsie Ra  Exercises Seated Knee Flexion AAROM - 2 x daily - 6 x weekly - 2 sets - 10 reps - 5 hold Seated Hamstring Stretch - 2 x daily - 6 x weekly - 1 sets - 3 reps - 30 hold Long Sitting Quad Set with Towel Roll Under Heel - 2 x daily - 6 x weekly - 3 sets - 10 reps Standing Gastroc Stretch at Counter - 2 x daily - 6 x weekly - 1 sets - 3 reps - 30 hold Side Stepping with Counter Support - 2 x daily - 6 x weekly - 1 sets - 3-5 reps Recumbent Bike - 2 x daily - 6 x weekly - 3 sets - 10 reps    ASSESSMENT:   CLINICAL IMPRESSION: Session focused on pt education regarding upcoming Rt TKA surgery. Pt is very hesitant to have this surgery and had many questions regarding precautions, pain levels, recovery time, and HHPT after the surgery. PT answered questions as much as possible, but explained that every pt responds differently to the surgery and it is hard to pre- determine these variables. Pt HEP was also modified to help reduce the pain being experienced with previous HEP. Pt tolerated today's treatment well.    REHAB  POTENTIAL: Good   CLINICAL DECISION MAKING: stable/uncomplicated   EVALUATION COMPLEXITY: Low     GOALS: Long term PT goals (target dates for all long term goals are 4 weeks 06/05/21) Pt will improve R knee flexion ROM 10 deg.  Baseline: 85 deg.  Goal status: On-going Pt will improve  hip/knee strength to at least 4+/5 MMT to improve functional strength Baseline: Goal status: On-going Pt will improve FOTO to at least 51% functional to show improved function (did not write goal for full predicted value as she will need surgery) Baseline: 47% now Goal status: On-going Pt will reduce pain by overall 25% overall with usual activity Baseline: Goal status:On-going  PLAN: PT FREQUENCY: 1 time per week    PT DURATION: 4 weeks   PLANNED INTERVENTIONS (unless contraindicated): aquatic PT,  cryotherapy, Electrical stimulation, Iontophoresis with 4 mg/ml dexamethasome, Moist heat, traction, Ultrasound, gait training, Therapeutic exercise, balance training, neuromuscular re-education, patient/family education,  manual techniques, passive ROM, dry needling, taping, vasopnuematic device,  joint manipulations   PLAN FOR NEXT SESSION: Reassess pt's measurements, last visit before surgery   Wylie Russon Singer, Student-PT 05/28/2021, 3:33 PM

## 2021-06-04 ENCOUNTER — Encounter: Payer: Medicare Other | Admitting: Physical Therapy

## 2021-06-14 ENCOUNTER — Other Ambulatory Visit: Payer: Self-pay | Admitting: Orthopaedic Surgery

## 2021-06-14 DIAGNOSIS — G8929 Other chronic pain: Secondary | ICD-10-CM

## 2021-06-14 DIAGNOSIS — M170 Bilateral primary osteoarthritis of knee: Secondary | ICD-10-CM

## 2021-06-24 ENCOUNTER — Ambulatory Visit (INDEPENDENT_AMBULATORY_CARE_PROVIDER_SITE_OTHER): Payer: Medicare Other | Admitting: Physical Therapy

## 2021-06-24 ENCOUNTER — Other Ambulatory Visit: Payer: Self-pay

## 2021-06-24 ENCOUNTER — Encounter: Payer: Self-pay | Admitting: Physical Therapy

## 2021-06-24 DIAGNOSIS — G8929 Other chronic pain: Secondary | ICD-10-CM

## 2021-06-24 DIAGNOSIS — M25561 Pain in right knee: Secondary | ICD-10-CM | POA: Diagnosis not present

## 2021-06-24 DIAGNOSIS — R2689 Other abnormalities of gait and mobility: Secondary | ICD-10-CM | POA: Diagnosis not present

## 2021-06-24 DIAGNOSIS — M6281 Muscle weakness (generalized): Secondary | ICD-10-CM | POA: Diagnosis not present

## 2021-06-24 DIAGNOSIS — M25661 Stiffness of right knee, not elsewhere classified: Secondary | ICD-10-CM | POA: Diagnosis not present

## 2021-06-24 DIAGNOSIS — R600 Localized edema: Secondary | ICD-10-CM

## 2021-06-24 NOTE — Therapy (Addendum)
?OUTPATIENT PHYSICAL THERAPY LOWER EXTREMITY EVALUATION ? ? ?Patient Name: Krystal Lang ?MRN: [id number] ?DOB:24-Feb-1956, 66 y.o., female ?Today's Date: 06/24/2021 ? ? PT End of Session - 06/24/21 1224   ? ? Visit Number 1   ? Number of Visits 24   ? Date for PT Re-Evaluation 08/23/21   ? Authorization Type Medicare, KX modifier starting at visit 11 (pt had 4 pre-op visits)   ? Progress Note Due on Visit 10   ? PT Start Time [phone]   ? PT Stop Time 8469   ? PT Time Calculation (min) 40 min   ? Activity Tolerance Patient tolerated treatment well;Patient limited by pain   ? Behavior During Therapy [facility] for tasks assessed/performed   ? ?  ?  ? ?  ? ? ?Past Medical History:  ?Diagnosis Date  ? Cataract   ? Mixed form OU  ? Diabetes mellitus   ? Fibroid uterus 2001  ? H/O fatigue 1998  ? Headache(784.0)   ? Hirsutism 1998  ? Idiopathic  ? History of chicken pox   ? Hypothyroidism   ? Menses, irregular 1998  ? ?Past Surgical History:  ?Procedure Laterality Date  ? DILATION AND CURETTAGE OF UTERUS  2002  ? In [place]  ? KNEE SURGERY    ? TONSILECTOMY, ADENOIDECTOMY, BILATERAL MYRINGOTOMY AND TUBES    ? TONSILLECTOMY    ? ?Patient Active Problem List  ? Diagnosis Date Noted  ? Bilateral primary osteoarthritis of knee 05/11/2017  ? Dyspareunia, female 11/13/2011  ? Type 2 diabetes mellitus ([place]) 11/13/2011  ? Alopecia 11/13/2011  ? History of chicken pox   ? Headache(784.0)   ? Hypothyroidism   ? Menses, irregular   ? Hirsutism   ? H/O fatigue   ? Fibroid uterus   ? ? ?PCP: Dixie Dials, MD ? ?REFERRING PROVIDER: Lynnell Jude MD ? ?REFERRING DIAG: Z96.659 (ICD-10-CM) - S/P total knee arthroplasty ? ?THERAPY DIAG:  ?Chronic pain of right knee ? ?Stiffness of right knee, not elsewhere classified ? ?Other abnormalities of gait and mobility ? ?Muscle weakness (generalized) ? ?Localized edema  ? ?ONSET DATE: 06/05/2021 ? ?SUBJECTIVE:  ? ?SUBJECTIVE STATEMENT: ?Pt arriving to therapy amb with rolling walker. Pt reporting  pain of 5/10 in her right knee s/p Rt TKA on 06/05/2021 performed robotically at [facility] in [place] by Dr. Dema Severin MD.  ? ?PERTINENT HISTORY: ?RT TKA on 06/05/2021 ? ?PAIN:  ?Are you having pain? Yes: NPRS scale: 5/10 ?Pain location: knee ?Pain description: right ?Aggravating factors: bending, leaving straight while elevated ?Relieving factors: changing positions, pain medication, ice ? ?PRECAUTIONS: None ? ?WEIGHT BEARING RESTRICTIONS No ? ?FALLS:  ?Has patient fallen in last 6 months? , Number of falls: 0 ? ?LIVING ENVIRONMENT: ?Lives with: lives with their family and lives with their spouse ?Lives in: House/apartment ?Stairs: Yes: Internal: 1 flight steps; on right going up ?Has following equipment at home: Gilford Rile - 2 wheeled ? ?OCCUPATION:  ? ?PLOF: Independent ? ?PATIENT GOALS:  Be able to walk without device. ? ? ?OBJECTIVE:  ? ?DIAGNOSTIC FINDINGS: prior to surgery: significant tricompartmental arthritis predominantly in the medial compartment where there are areas of complete cartilage loss.  There is also a radial tear of the medial meniscus posteriorly. ? ?PATIENT SURVEYS:  ?06/24/2021: FOTO 47% (predicted 62%) ? ?COGNITION: ? Overall cognitive status: Within functional limits for tasks assessed   ?  ?SENSATION: ?Numbness along lateral joint line of Rt knee ? ? ? ?POSTURE:  ?  Rounded shoulders ? ?PALPATION: ?TTP: medial joint line, pt reporting lateral numbness ?  ?  EDEMA: Rt: 43 centimeters, Left 38.5 centimeters ? ?LE ROM: ? ?Active ROM Right ?06/24/2021 Left ?06/24/2021  ?Knee flexion 90 112  ?Knee extension -5 -2  ? (Blank rows = not tested) ? ?Passive ROM        Rt     Left ?Knee flexion 95 115  ?Knee extension -3 -2  ? ? ?LE MMT: ? ?MMT Right ?06/24/2021 Left ?06/24/2021  ?Hip flexion 5/5 5/5  ?Hip extension    ?Hip abduction    ?Hip adduction 5/5 5/5  ?Hip internal rotation    ?Hip external rotation    ?Knee flexion 4-/5 5/5  ?Knee extension 3+/5 5/5  ?Ankle dorsiflexion    ?Ankle  plantarflexion    ?Ankle inversion    ?Ankle eversion    ? (Blank rows = not tested) ? ? ? ?FUNCTIONAL TESTS:  ?06/24/2021:  ?5 times sit to stand: 14 seconds with UE support with walker in front if needed.  ? ?GAIT: ?Distance walked: amb with rolling walker with mild antalgic gait with decreased terminal knee extension on Rt LE ?Assistive device utilized: Environmental consultant - 2 wheeled ?Level of assistance: Modified independence ?Comments: see above ? ? ? ?TODAY'S TREATMENT: ?HEP instruction/performance c cues for techniques, handout provided.  Trial set performed of each for comprehension and symptom assessment.  See below for exercise list. ? ? ?PATIENT EDUCATION:  ?Education details: PT POC, HEP ?Person educated: Patient ?Education method: Explanation, Demonstration, Tactile cues, Verbal cues, and Handouts ?Education comprehension: verbalized understanding and returned demonstration ? ? ?HOME EXERCISE PROGRAM: ?Access Code: [email or web address] ?URL: [email or web address] ?Date: 06/24/2021 ?Prepared by: Kearney Hard ? ?Exercises ?- Seated Long Arc Quad  - 3 x daily - 7 x weekly - 2 sets - 10 reps ?- Long Sitting Quad Set with Towel Roll Under Heel  - 3 x daily - 7 x weekly - 2 sets - 10 reps - 5-10 seconds hold ?- Supine Heel Slide with Strap  - 3 x daily - 7 x weekly - 2 sets - 10 reps - 5 seconds hold ?- Sit to Stand with Counter Support  - 3 x daily - 7 x weekly - 2 sets - 10 reps ? ?ASSESSMENT: ? ?CLINICAL IMPRESSION: ?06/24/2021:  ?Patient is a 66 y.o. who comes to clinic with complaints of Rt knee pain s/p Rt TKAwith mobility, strength and movement coordination deficits that impair their ability to perform usual daily and recreational functional activities without increase difficulty/symptoms at this time.  Patient to benefit from skilled PT services to address impairments and limitations to improve to previous level of function without restriction secondary to condition.  ? ? ? ?OBJECTIVE IMPAIRMENTS Abnormal  gait, decreased activity tolerance, decreased balance, decreased mobility, difficulty walking, decreased ROM, decreased strength, increased edema, impaired flexibility, postural dysfunction, and pain.  ? ?ACTIVITY LIMITATIONS cleaning and community activity.  ? ?PERSONAL FACTORS 3+ comorbidities: DM, hypothyroidism, HA  are also affecting patient's functional outcome.  ? ? ?REHAB POTENTIAL: Good ? ?CLINICAL DECISION MAKING: Stable/uncomplicated ? ?EVALUATION COMPLEXITY: Low ? ? ?GOALS: ?Goals reviewed with patient? Yes ?Short term PT Goals (target date for Short term goals are 4 weeks 07/26/2021) ?Patient will demonstrate independent use of home exercise program to maintain progress from in clinic treatments. ?Goal status: New ?  ?Long term PT goals (target dates for all long term goals are 8 weeks  08/23/2021 ) ?Patient will demonstrate/report pain at worst less  than or equal to 2/10 to facilitate minimal limitation in daily activity secondary to pain symptoms. ?Goal status: New ? ?Patient will demonstrate independent use of home exercise program to facilitate ability to maintain/progress functional gains from skilled physical therapy services. ?Goal status: New ? ?Patient will demonstrate FOTO outcome > or = 62 % to indicate reduced disability due to condition. ?Goal status: 47% on 06/24/2021 at eval ? ?Pt will be able to amb with no device with normalized gait pattern on community surfaces.  ?Goal status: New ? ?    5.  Pt will improve Rt knee flexion to >/= 120 degrees to improve functional mobility and gait.  ?Goal status: New ? ? ?PLAN: ?PT FREQUENCY: 3x/week ? ?PT DURATION: 8 weeks ? ?PLANNED INTERVENTIONS: Therapeutic exercises, Therapeutic activity, Neuro Muscular re-education, Balance training, Gait training, Patient/Family education, Joint mobilization, Stair training, DME instructions, Dry Needling, Electrical stimulation, Cryotherapy, Moist heat, Taping, Ultrasound, Ionotophoresis '4mg'$ /ml Dexamethasone, and  Manual therapy.  All included unless contraindicated ? ?PLAN FOR NEXT SESSION: Nustep, Knee ROM, functional mobility, gait training progression and try cane if pt able to perform SLR x 10 with no lag ? ? ?Jennife

## 2021-06-27 ENCOUNTER — Ambulatory Visit (INDEPENDENT_AMBULATORY_CARE_PROVIDER_SITE_OTHER): Payer: Medicare Other | Admitting: Physical Therapy

## 2021-06-27 ENCOUNTER — Other Ambulatory Visit: Payer: Self-pay

## 2021-06-27 ENCOUNTER — Encounter: Payer: Self-pay | Admitting: Physical Therapy

## 2021-06-27 DIAGNOSIS — R2689 Other abnormalities of gait and mobility: Secondary | ICD-10-CM | POA: Diagnosis not present

## 2021-06-27 DIAGNOSIS — R600 Localized edema: Secondary | ICD-10-CM

## 2021-06-27 DIAGNOSIS — G8929 Other chronic pain: Secondary | ICD-10-CM

## 2021-06-27 DIAGNOSIS — M25561 Pain in right knee: Secondary | ICD-10-CM

## 2021-06-27 DIAGNOSIS — M25661 Stiffness of right knee, not elsewhere classified: Secondary | ICD-10-CM

## 2021-06-27 DIAGNOSIS — M6281 Muscle weakness (generalized): Secondary | ICD-10-CM | POA: Diagnosis not present

## 2021-06-27 NOTE — Therapy (Addendum)
?OUTPATIENT PHYSICAL THERAPY TREATMENT NOTE ? ? ?Patient Name: Krystal Lang ?MRN: [id number] ?DOB:06/16/1955, 66 y.o., female ?Today's Date: 06/27/2021 ? ?PCP: Dixie Dials, MD ?REFERRING PROVIDER: Lynnell Jude MD ? ? PT End of Session - 06/27/21 1315   ? ? Visit Number 2   ? Number of Visits 24   ? Date for PT Re-Evaluation 08/23/21   ? Authorization Type Medicare, KX modifier starting at visit 11 (pt had 4 pre-op visits)   ? Progress Note Due on Visit 10   ? PT Start Time 1311   ? PT Stop Time 1412   ? PT Time Calculation (min) 61 min   ? Activity Tolerance Patient tolerated treatment well;Patient limited by pain   ? Behavior During Therapy [facility] for tasks assessed/performed   ? ?  ?  ? ?  ? ? ?Past Medical History:  ?Diagnosis Date  ? Cataract   ? Mixed form OU  ? Diabetes mellitus   ? Fibroid uterus 2001  ? H/O fatigue 1998  ? Headache(784.0)   ? Hirsutism 1998  ? Idiopathic  ? History of chicken pox   ? Hypothyroidism   ? Menses, irregular 1998  ? ?Past Surgical History:  ?Procedure Laterality Date  ? DILATION AND CURETTAGE OF UTERUS  2002  ? In [place]  ? KNEE SURGERY    ? TONSILECTOMY, ADENOIDECTOMY, BILATERAL MYRINGOTOMY AND TUBES    ? TONSILLECTOMY    ? ?Patient Active Problem List  ? Diagnosis Date Noted  ? Bilateral primary osteoarthritis of knee 05/11/2017  ? Dyspareunia, female 11/13/2011  ? Type 2 diabetes mellitus ([place]) 11/13/2011  ? Alopecia 11/13/2011  ? History of chicken pox   ? Headache(784.0)   ? Hypothyroidism   ? Menses, irregular   ? Hirsutism   ? H/O fatigue   ? Fibroid uterus   ? ? ?REFERRING DIAG: Z96.659 (ICD-10-CM) - S/P total knee arthroplasty 06/05/2021 ? ?THERAPY DIAG:  ?Chronic pain of right knee ? ?Stiffness of right knee, not elsewhere classified ? ?Other abnormalities of gait and mobility ? ?Muscle weakness (generalized) ? ?Localized edema ? ?PERTINENT HISTORY: RT TKA on 06/05/2021 ? ?PRECAUTIONS: None ? ?SUBJECTIVE: She reports doing the exercises 2-3 times per day.    ? ?PAIN:  ?Are you having pain? Yes: NPRS scale: ranging 1/10 to 7-8/10/10 ?Pain location: knee anterior ?Pain description: intermittent heavy, tightness, sometimes sharp ?Aggravating factors: change position sleeping  ?Relieving factors: ice, meds Rx ? ?OBJECTIVE:  ?  ?DIAGNOSTIC FINDINGS: prior to surgery: significant tricompartmental arthritis predominantly in the medial compartment where there are areas of complete cartilage loss.  There is also a radial tear of the medial meniscus posteriorly. ?  ?PATIENT SURVEYS:  ?06/24/2021: FOTO 47% (predicted 62%) ?  ?POSTURE:  Rounded shoulders ?  ?PALPATION: ?06/24/2021 TTP: medial joint line, pt reporting lateral numbness ?           ?EDEMA: Rt: 43 centimeters, Left 38.5 centimeters ?  ?LE ROM: ?  ?Active ROM Right ?06/24/21 Left ?06/24/21  ?Knee flexion 90 112  ?Knee extension -5 -2  ?(Blank rows = not tested) ?  ?Passive ROM Rt ?06/24/21 Lt ?06/24/21  ?Knee flexion 95 115  ?Knee extension -3 -2  ?  ?  ?LE MMT: ?  ?MMT Right ?06/24/21 Left ?06/24/21  ?Hip flexion 5/5 5/5  ?Hip extension      ?Hip abduction      ?Hip adduction 5/5 5/5  ?Hip internal rotation      ?Hip external rotation      ?  Knee flexion 4-/5 5/5  ?Knee extension 3+/5 5/5  ?Ankle dorsiflexion      ?Ankle plantarflexion      ?Ankle inversion      ?Ankle eversion      ?(Blank rows = not tested) ?  ?FUNCTIONAL TESTS:  ?06/24/2021: 5 times sit to stand: 14 seconds with UE support with walker in front if needed.  ?  ?GAIT: ?06/24/2021  Distance walked: amb with rolling walker with mild antalgic gait with decreased terminal knee extension on Rt LE ?Assistive device utilized: Environmental consultant - 2 wheeled ?Level of assistance: Modified independence ?Comments: see above ? ?TODAY'S TREATMENT: ?06/27/2021 ?Therapeutic Exercise: ? Nustep level 5 with BUEs & BLEs for 13 min ? Seated heel slides with foot on pillow case to reduce resistance - 5 sec hold ext / quad set & max AROM knee flex with contralateral LE flexing few more degrees 5  sec hold 20 reps. ? Supine SAQ 0# 10 reps 2 sets ? Supine heel slides with ball & strap 5 sec hold flex & ext 10 reps 2 sets ?PT recommended doing 1-2 exercises every 1-2 hours during day rotating thru HEP. Pt verbalized understanding. ? ?Pt ambulated 40' X 2 with cane with cues on sequencing. Pt ambulated 5' without device. PT instructed that need to slowly decrease frequency & distance with less UE support to minimize spikes in pain. Pt verbalized understanding. ? ?Self-care:  PT demo & verbal cues to pt & husband gentle scar mobs 4 directions. Pt & husband verbalized with pt return demo understanding. ?PT instructed in positioning in bed with pillows to "tent" sheets off feet and sidelying pillow between LEs. Pt & husband verbalized understanding. ?PT discussed pain meds with recommendation to consider oxy 1 hour before bed and 12 hours later.  If awakens at night, try her '500mg'$  tylenol & she reports Benadryl.  ~middle of morning & evening oxy, take '500mg'$  tylenol or 1 hour before PT. Pt & husband verbalized understanding and recommendation to always eat something prior to pain meds so stomach is not empty. ? ?Vasopneumatic right knee medium compression 34* 10 min. ? ?06/24/2021 ?HEP instruction/performance c cues for techniques, handout provided.  Trial set performed of each for comprehension and symptom assessment.  See below for exercise list. ?  ?  ?PATIENT EDUCATION:  ?Education details: PT POC, HEP ?Person educated: Patient ?Education method: Explanation, Demonstration, Tactile cues, Verbal cues, and Handouts ?Education comprehension: verbalized understanding and returned demonstration ?  ?  ?HOME EXERCISE PROGRAM: ?Access Code: [email or web address] ?URL: [email or web address] ?Date: 06/24/2021 ?Prepared by: Kearney Hard ?  ?Exercises ?- Seated Long Arc Quad  - 3 x daily - 7 x weekly - 2 sets - 10 reps ?- Long Sitting Quad Set with Towel Roll Under Heel  - 3 x daily - 7 x weekly - 2 sets - 10 reps - 5-10  seconds hold ?- Supine Heel Slide with Strap  - 3 x daily - 7 x weekly - 2 sets - 10 reps - 5 seconds hold ?- Sit to Stand with Counter Support  - 3 x daily - 7 x weekly - 2 sets - 10 reps ?  ?ASSESSMENT: ?  ?CLINICAL IMPRESSION: ?Patient needed encouragement & reinforcing of instructions as she seemed anxious.  She seems to understand PT recommendations to break up exercises during day to reduce getting stiff between times. She also seems to understand scar mobs, positioning for bed and recommendations for pain management.  ?  ?OBJECTIVE IMPAIRMENTS Abnormal gait, decreased  activity tolerance, decreased balance, decreased mobility, difficulty walking, decreased ROM, decreased strength, increased edema, impaired flexibility, postural dysfunction, and pain.  ?  ?ACTIVITY LIMITATIONS cleaning and community activity.  ?  ?PERSONAL FACTORS 3+ comorbidities: DM, hypothyroidism, HA  are also affecting patient's functional outcome.  ?  ?REHAB POTENTIAL: Good ?  ?CLINICAL DECISION MAKING: Stable/uncomplicated ?  ?EVALUATION COMPLEXITY: Low ?  ?  ?GOALS: ?Goals reviewed with patient? Yes ?Short term PT Goals (target date for Short term goals are 4 weeks 07/26/2021) ?Patient will demonstrate independent use of home exercise program to maintain progress from in clinic treatments. ?Goal status: New ?  ?Long term PT goals (target dates for all long term goals are 8 weeks  08/23/2021 ) ?Patient will demonstrate/report pain at worst less than or equal to 2/10 to facilitate minimal limitation in daily activity secondary to pain symptoms. ?Goal status: New ?  ?Patient will demonstrate independent use of home exercise program to facilitate ability to maintain/progress functional gains from skilled physical therapy services. ?Goal status: New ?  ?Patient will demonstrate FOTO outcome > or = 62 % to indicate reduced disability due to condition. ?Goal status: 47% on 06/24/2021 at eval ?  ?Pt will be able to amb with no device with  normalized gait pattern on community surfaces.  ?Goal status: New ?  ?    5.  Pt will improve Rt knee flexion to >/= 120 degrees to improve functional mobility and gait.  ?Goal status: New ?  ?  ?PLAN: ?PT FREQUEN

## 2021-06-28 ENCOUNTER — Ambulatory Visit (INDEPENDENT_AMBULATORY_CARE_PROVIDER_SITE_OTHER): Payer: Medicare Other | Admitting: Physical Therapy

## 2021-06-28 ENCOUNTER — Encounter: Payer: Self-pay | Admitting: Physical Therapy

## 2021-06-28 DIAGNOSIS — M6281 Muscle weakness (generalized): Secondary | ICD-10-CM

## 2021-06-28 DIAGNOSIS — G8929 Other chronic pain: Secondary | ICD-10-CM

## 2021-06-28 DIAGNOSIS — R2689 Other abnormalities of gait and mobility: Secondary | ICD-10-CM | POA: Diagnosis not present

## 2021-06-28 DIAGNOSIS — M25561 Pain in right knee: Secondary | ICD-10-CM | POA: Diagnosis not present

## 2021-06-28 DIAGNOSIS — M25661 Stiffness of right knee, not elsewhere classified: Secondary | ICD-10-CM

## 2021-06-28 NOTE — Therapy (Addendum)
?OUTPATIENT PHYSICAL THERAPY TREATMENT NOTE ? ? ?Patient Name: Krystal Lang ?MRN: [id number] ?DOB:03/12/1956, 66 y.o., female ?Today's Date: 06/28/2021 ? ?PCP: Dixie Dials, MD ?REFERRING PROVIDER: Lynnell Jude MD ? ? PT End of Session - 06/28/21 1109   ? ? Visit Number 3   ? Number of Visits 24   ? Date for PT Re-Evaluation 08/23/21   ? Authorization Type Medicare, KX modifier starting at visit 11 (pt had 4 pre-op visits)   ? Progress Note Due on Visit 10   ? PT Start Time 1100   ? PT Stop Time 1145   ? PT Time Calculation (min) 45 min   ? Activity Tolerance Patient tolerated treatment well;Patient limited by pain   ? Behavior During Therapy [facility] for tasks assessed/performed   ? ?  ?  ? ?  ? ? ?Past Medical History:  ?Diagnosis Date  ? Cataract   ? Mixed form OU  ? Diabetes mellitus   ? Fibroid uterus 2001  ? H/O fatigue 1998  ? Headache(784.0)   ? Hirsutism 1998  ? Idiopathic  ? History of chicken pox   ? Hypothyroidism   ? Menses, irregular 1998  ? ?Past Surgical History:  ?Procedure Laterality Date  ? DILATION AND CURETTAGE OF UTERUS  2002  ? In [place]  ? KNEE SURGERY    ? TONSILECTOMY, ADENOIDECTOMY, BILATERAL MYRINGOTOMY AND TUBES    ? TONSILLECTOMY    ? ?Patient Active Problem List  ? Diagnosis Date Noted  ? Bilateral primary osteoarthritis of knee 05/11/2017  ? Dyspareunia, female 11/13/2011  ? Type 2 diabetes mellitus ([place]) 11/13/2011  ? Alopecia 11/13/2011  ? History of chicken pox   ? Headache(784.0)   ? Hypothyroidism   ? Menses, irregular   ? Hirsutism   ? H/O fatigue   ? Fibroid uterus   ? ? ?REFERRING DIAG: Z96.659 (ICD-10-CM) - S/P total knee arthroplasty 06/05/2021 ? ?THERAPY DIAG:  ?Chronic pain of right knee ? ?Stiffness of right knee, not elsewhere classified ? ?Other abnormalities of gait and mobility ? ?Muscle weakness (generalized) ? ?Localized edema ? ?PERTINENT HISTORY: RT TKA on 06/05/2021 ? ?PRECAUTIONS: None ? ?SUBJECTIVE: She reports doing the exercises 2-3 times per day.    ? ?PAIN:  ?Are you having pain? Yes: NPRS scale: ranging 1/10 to 7-8/10/10 ?Pain location: knee anterior ?Pain description: intermittent heavy, tightness, sometimes sharp ?Aggravating factors: change position sleeping  ?Relieving factors: ice, meds Rx ? ?OBJECTIVE:  ?  ?DIAGNOSTIC FINDINGS: prior to surgery: significant tricompartmental arthritis predominantly in the medial compartment where there are areas of complete cartilage loss.  There is also a radial tear of the medial meniscus posteriorly. ?  ?PATIENT SURVEYS:  ?06/24/2021: FOTO 47% (predicted 62%) ?  ?POSTURE:  Rounded shoulders ?  ?PALPATION: ?06/24/2021 TTP: medial joint line, pt reporting lateral numbness ?           ?EDEMA: Rt: 43 centimeters, Left 38.5 centimeters ?  ?LE ROM: ?  ?Active ROM Right ?06/24/21 Left ?06/24/21 Right ?06/28/21  ?Knee flexion 90 112 98  ?Knee extension -5 -2 -2  ?(Blank rows = not tested) ?  ?Passive ROM Rt ?06/24/21 Lt ?06/24/21 Rt ?06/28/21  ?Knee flexion 95 115 104  ?Knee extension -3 -2 -1  ?  ?  ?LE MMT: ?  ?MMT Right ?06/24/21 Left ?06/24/21  ?Hip flexion 5/5 5/5  ?Hip extension      ?Hip abduction      ?Hip adduction 5/5 5/5  ?Hip internal rotation      ?  Hip external rotation      ?Knee flexion 4-/5 5/5  ?Knee extension 3+/5 5/5  ?Ankle dorsiflexion      ?Ankle plantarflexion      ?Ankle inversion      ?Ankle eversion      ?(Blank rows = not tested) ?  ?FUNCTIONAL TESTS:  ?06/24/2021: 5 times sit to stand: 14 seconds with UE support with walker in front if needed.  ?  ?GAIT: ?06/24/2021  Distance walked: amb with rolling walker with mild antalgic gait with decreased terminal knee extension on Rt LE ?Assistive device utilized: Environmental consultant - 2 wheeled ?Level of assistance: Modified independence ?Comments: see above ? ?TODAY'S TREATMENT: ?06/28/21 ?Therapeutic Exercise: ?Aerobic: Nu step LE/UE L5 X10 min seat#9 ?Supine: ?Prone: ?Seated: knee flexion AAROM tailgate stretch 5 sec X 15. LAQ X15 on Rt. Seated SLR X 15 on Rt ?Standing: bilat  gastroc stretch on slantboard 30 sec X 3. Bilat hip abd X10, bilat H.S curls X10. Bilat hip marches X10 ?Machines: ?Neuromuscular Re-education: ?Manual Therapy:Rt knee PROM with overpressure to tolerance ?Therapeutic Activity: ?Modalities: Vasopneumatic right knee medium compression 34* 10 min. ? ?06/27/2021 ?Therapeutic Exercise: ? Nustep level 5 with BUEs & BLEs for 13 min ? Seated heel slides with foot on pillow case to reduce resistance - 5 sec hold ext / quad set & max AROM knee flex with contralateral LE flexing few more degrees 5 sec hold 20 reps. ? Supine SAQ 0# 10 reps 2 sets ? Supine heel slides with ball & strap 5 sec hold flex & ext 10 reps 2 sets ?PT recommended doing 1-2 exercises every 1-2 hours during day rotating thru HEP. Pt verbalized understanding. ? ?Pt ambulated 40' X 2 with cane with cues on sequencing. Pt ambulated 5' without device. PT instructed that need to slowly decrease frequency & distance with less UE support to minimize spikes in pain. Pt verbalized understanding. ? ?Self-care:  PT demo & verbal cues to pt & husband gentle scar mobs 4 directions. Pt & husband verbalized with pt return demo understanding. ?PT instructed in positioning in bed with pillows to "tent" sheets off feet and sidelying pillow between LEs. Pt & husband verbalized understanding. ?PT discussed pain meds with recommendation to consider oxy 1 hour before bed and 12 hours later.  If awakens at night, try her '500mg'$  tylenol & she reports Benadryl.  ~middle of morning & evening oxy, take '500mg'$  tylenol or 1 hour before PT. Pt & husband verbalized understanding and recommendation to always eat something prior to pain meds so stomach is not empty. ? ?Vasopneumatic right knee medium compression 34* 10 min. ? ? ?  ?  ?PATIENT EDUCATION:  ?Education details: PT POC, HEP ?Person educated: Patient ?Education method: Explanation, Demonstration, Tactile cues, Verbal cues, and Handouts ?Education comprehension: verbalized  understanding and returned demonstration ?  ?  ?HOME EXERCISE PROGRAM: ?Access Code: [email or web address] ?URL: [email or web address] ?Date: 06/24/2021 ?Prepared by: Kearney Hard ?  ?Exercises ?- Seated Long Arc Quad  - 3 x daily - 7 x weekly - 2 sets - 10 reps ?- Long Sitting Quad Set with Towel Roll Under Heel  - 3 x daily - 7 x weekly - 2 sets - 10 reps - 5-10 seconds hold ?- Supine Heel Slide with Strap  - 3 x daily - 7 x weekly - 2 sets - 10 reps - 5 seconds hold ?- Sit to Stand with Counter Support  - 3 x daily - 7 x weekly - 2 sets - 10  reps ?  ?ASSESSMENT: ?  ?CLINICAL IMPRESSION: ?We continued to work to gently progress her Rt knee ROM and strength to her tolerance. She has been compliant with HEP and is progressing very well early on, see updated ROM measurements. ?  ?OBJECTIVE IMPAIRMENTS Abnormal gait, decreased activity tolerance, decreased balance, decreased mobility, difficulty walking, decreased ROM, decreased strength, increased edema, impaired flexibility, postural dysfunction, and pain.  ?  ?ACTIVITY LIMITATIONS cleaning and community activity.  ?  ?PERSONAL FACTORS 3+ comorbidities: DM, hypothyroidism, HA  are also affecting patient's functional outcome.  ?  ?REHAB POTENTIAL: Good ?  ?CLINICAL DECISION MAKING: Stable/uncomplicated ?  ?EVALUATION COMPLEXITY: Low ?  ?  ?GOALS: ?Goals reviewed with patient? Yes ?Short term PT Goals (target date for Short term goals are 4 weeks 07/26/2021) ?Patient will demonstrate independent use of home exercise program to maintain progress from in clinic treatments. ?Goal status: New ?  ?Long term PT goals (target dates for all long term goals are 8 weeks  08/23/2021 ) ?Patient will demonstrate/report pain at worst less than or equal to 2/10 to facilitate minimal limitation in daily activity secondary to pain symptoms. ?Goal status: New ?  ?Patient will demonstrate independent use of home exercise program to facilitate ability to maintain/progress functional  gains from skilled physical therapy services. ?Goal status: New ?  ?Patient will demonstrate FOTO outcome > or = 62 % to indicate reduced disability due to condition. ?Goal status: 47% on 06/24/2021 at eval ?  ?Pt will

## 2021-07-01 ENCOUNTER — Other Ambulatory Visit: Payer: Self-pay

## 2021-07-01 ENCOUNTER — Ambulatory Visit (INDEPENDENT_AMBULATORY_CARE_PROVIDER_SITE_OTHER): Payer: Medicare Other | Admitting: Physical Therapy

## 2021-07-01 ENCOUNTER — Encounter: Payer: Self-pay | Admitting: Physical Therapy

## 2021-07-01 DIAGNOSIS — R600 Localized edema: Secondary | ICD-10-CM

## 2021-07-01 DIAGNOSIS — M6281 Muscle weakness (generalized): Secondary | ICD-10-CM | POA: Diagnosis not present

## 2021-07-01 DIAGNOSIS — R2689 Other abnormalities of gait and mobility: Secondary | ICD-10-CM

## 2021-07-01 DIAGNOSIS — M25561 Pain in right knee: Secondary | ICD-10-CM | POA: Diagnosis not present

## 2021-07-01 DIAGNOSIS — G8929 Other chronic pain: Secondary | ICD-10-CM

## 2021-07-01 DIAGNOSIS — M25661 Stiffness of right knee, not elsewhere classified: Secondary | ICD-10-CM

## 2021-07-01 NOTE — Therapy (Signed)
?OUTPATIENT PHYSICAL THERAPY TREATMENT NOTE ? ? ?Patient Name: Krystal Lang ?MRN: [id number] ?DOB:25-Sep-1955, 66 y.o., female ?Today's Date: 07/01/2021 ? ?PCP: Dixie Dials, MD ?REFERRING PROVIDER: Lynnell Jude MD ? ? PT End of Session - 07/01/21 1524   ? ? Visit Number 4   ? Number of Visits 24   ? Date for PT Re-Evaluation 08/23/21   ? Authorization Type Medicare, KX modifier starting at visit 11 (pt had 4 pre-op visits)   ? Progress Note Due on Visit 10   ? PT Start Time 1520   ? PT Stop Time 1620   ? PT Time Calculation (min) 60 min   ? Activity Tolerance Patient tolerated treatment well;Patient limited by pain   ? Behavior During Therapy [facility] for tasks assessed/performed   ? ?  ?  ? ?  ? ? ? ?Past Medical History:  ?Diagnosis Date  ? Cataract   ? Mixed form OU  ? Diabetes mellitus   ? Fibroid uterus 2001  ? H/O fatigue 1998  ? Headache(784.0)   ? Hirsutism 1998  ? Idiopathic  ? History of chicken pox   ? Hypothyroidism   ? Menses, irregular 1998  ? ?Past Surgical History:  ?Procedure Laterality Date  ? DILATION AND CURETTAGE OF UTERUS  2002  ? In [place]  ? KNEE SURGERY    ? TONSILECTOMY, ADENOIDECTOMY, BILATERAL MYRINGOTOMY AND TUBES    ? TONSILLECTOMY    ? ?Patient Active Problem List  ? Diagnosis Date Noted  ? Bilateral primary osteoarthritis of knee 05/11/2017  ? Dyspareunia, female 11/13/2011  ? Type 2 diabetes mellitus ([place]) 11/13/2011  ? Alopecia 11/13/2011  ? History of chicken pox   ? Headache(784.0)   ? Hypothyroidism   ? Menses, irregular   ? Hirsutism   ? H/O fatigue   ? Fibroid uterus   ? ? ?REFERRING DIAG: Z96.659 (ICD-10-CM) - S/P total knee arthroplasty 06/05/2021 ? ?THERAPY DIAG:  ?Chronic pain of right knee ? ?Stiffness of right knee, not elsewhere classified ? ?Other abnormalities of gait and mobility ? ?Muscle weakness (generalized) ? ?Localized edema ? ?PERTINENT HISTORY: RT TKA on 06/05/2021 ? ?PRECAUTIONS: None ? ?SUBJECTIVE: She felt like she over did it on Friday so she rested on  Saturday. She was better on Sunday and returned to doing her exercises. The pillows have improved her sleep a little. She has been eating prior to meds and no more nausea.  ? ?PAIN:  ?Are you having pain? Yes: NPRS scale: avg 5-6 ranging 3-4/10 to 8/10 ?Pain location: knee anterior ?Pain description: intermittent heavy, tightness, sometimes sharp ?Aggravating factors: change position sleeping  ?Relieving factors: ice, meds Rx ? ?OBJECTIVE:  ?  ?DIAGNOSTIC FINDINGS: prior to surgery: significant tricompartmental arthritis predominantly in the medial compartment where there are areas of complete cartilage loss.  There is also a radial tear of the medial meniscus posteriorly. ?  ?PATIENT SURVEYS:  ?06/24/2021: FOTO 47% (predicted 62%) ?  ?POSTURE:  Rounded shoulders ?  ?PALPATION: ?06/24/2021 TTP: medial joint line, pt reporting lateral numbness ?           ?EDEMA: Rt: 43 centimeters, Left 38.5 centimeters ?  ?LE ROM: ?  ?Active ROM Right ?06/24/21 Left ?06/24/21 Right ?06/28/21 Right ?07/01/21  ?Knee flexion 90 112 98 101* ?supine  ?Knee extension -5 -2 -2 -2* ?supine  ?(Blank rows = not tested) ?  ?Passive ROM Rt ?06/24/21 Lt ?06/24/21 Rt ?06/28/21  ?Knee flexion 95 115 104  ?Knee extension -3 -2 -  1  ?  ?  ?LE MMT: ?  ?MMT Right ?06/24/21 Left ?06/24/21  ?Hip flexion 5/5 5/5  ?Hip extension      ?Hip abduction      ?Hip adduction 5/5 5/5  ?Hip internal rotation      ?Hip external rotation      ?Knee flexion 4-/5 5/5  ?Knee extension 3+/5 5/5  ?Ankle dorsiflexion      ?Ankle plantarflexion      ?Ankle inversion      ?Ankle eversion      ?(Blank rows = not tested) ?  ?FUNCTIONAL TESTS:  ?06/24/2021: 5 times sit to stand: 14 seconds with UE support with walker in front if needed.  ?  ?GAIT: ?06/24/2021  Distance walked: amb with rolling walker with mild antalgic gait with decreased terminal knee extension on Rt LE ?Assistive device utilized: Environmental consultant - 2 wheeled ?Level of assistance: Modified independence ?Comments: see  above ? ?TODAY'S TREATMENT: ?07/01/2021 ?Therapeutic Exercise: ? Nustep level 5 seat 8 with BUEs & BLEs for 10 min ? Seated heel slides with foot on pillow case to reduce resistance - 5 sec hold ext / quad set & max AROM knee flex with contralateral LE flexing few more degrees 5 sec hold 20 reps. ? Seated LAQ & active knee flex 5 sec hold ea and contralateral LE opposite motion 20 reps. ? PT demo & verbal cues on towel roll under ankle when propping in 2nd chair and intermittently try to press leg outward extending knee. Prevent external rotation (not letting foot roll out).  Pt verbalized understanding.  ? ?Pt ambulated in clinic without device safely.  Pt recommended short distances like in home without device. Pt verb ? ?Self-care:  PT demo & verbal cues to pt & husband in ice massage. Pt verbalized understanding & return demo understanding.   ?Sitting on 24" bar stool to perform ADLs in kitchen to alternate with standing enabling counter top activities without full weight bearing on LEs. Pt verbalized understanding.   ? ?Manual therapy:  PROM for knee flex & ext.  ? ?Vasopneumatic right knee medium compression 34* 10 min. ? ?06/28/21 ?Therapeutic Exercise: ?Aerobic: Nu step LE/UE L5 X10 min seat#9 ?Supine: ?Prone: ?Seated: knee flexion AAROM tailgate stretch 5 sec X 15. LAQ X15 on Rt. Seated SLR X 15 on Rt ?Standing: bilat gastroc stretch on slantboard 30 sec X 3. Bilat hip abd X10, bilat H.S curls X10. Bilat hip marches X10 ?Machines: ?Neuromuscular Re-education: ?Manual Therapy:Rt knee PROM with overpressure to tolerance ?Therapeutic Activity: ?Modalities: Vasopneumatic right knee medium compression 34* 10 min. ? ?06/27/2021 ?Therapeutic Exercise: ? Nustep level 5 with BUEs & BLEs for 13 min ? Seated heel slides with foot on pillow case to reduce resistance - 5 sec hold ext / quad set & max AROM knee flex with contralateral LE flexing few more degrees 5 sec hold 20 reps. ? Supine SAQ 0# 10 reps 2 sets ? Supine  heel slides with ball & strap 5 sec hold flex & ext 10 reps 2 sets ?PT recommended doing 1-2 exercises every 1-2 hours during day rotating thru HEP. Pt verbalized understanding. ? ?Pt ambulated 40' X 2 with cane with cues on sequencing. Pt ambulated 5' without device. PT instructed that need to slowly decrease frequency & distance with less UE support to minimize spikes in pain. Pt verbalized understanding. ? ?Self-care:  PT demo & verbal cues to pt & husband gentle scar mobs 4 directions. Pt & husband verbalized with  pt return demo understanding. ?PT instructed in positioning in bed with pillows to "tent" sheets off feet and sidelying pillow between LEs. Pt & husband verbalized understanding. ?PT discussed pain meds with recommendation to consider oxy 1 hour before bed and 12 hours later.  If awakens at night, try her '500mg'$  tylenol & she reports Benadryl.  ~middle of morning & evening oxy, take '500mg'$  tylenol or 1 hour before PT. Pt & husband verbalized understanding and recommendation to always eat something prior to pain meds so stomach is not empty ?Vasopneumatic right knee medium compression 34* 10 min. ? ? ?  ?  ?PATIENT EDUCATION:  ?Education details: PT POC, HEP ?Person educated: Patient ?Education method: Explanation, Demonstration, Tactile cues, Verbal cues, and Handouts ?Education comprehension: verbalized understanding and returned demonstration ?  ?  ?HOME EXERCISE PROGRAM: ?Access Code: [email or web address] ?URL: [email or web address] ?Date: 06/24/2021 ?Prepared by: Kearney Hard ?  ?Exercises ?- Seated Long Arc Quad  - 3 x daily - 7 x weekly - 2 sets - 10 reps ?- Long Sitting Quad Set with Towel Roll Under Heel  - 3 x daily - 7 x weekly - 2 sets - 10 reps - 5-10 seconds hold ?- Supine Heel Slide with Strap  - 3 x daily - 7 x weekly - 2 sets - 10 reps - 5 seconds hold ?- Sit to Stand with Counter Support  - 3 x daily - 7 x weekly - 2 sets - 10 reps ?  ?ASSESSMENT: ?  ?CLINICAL IMPRESSION: ?Patient  appears to understand ice massage to isolate ice pain management.  Her AROM is improving. Patient continues to be anxious and needs extra care / instruction.   ?  ?OBJECTIVE IMPAIRMENTS Abnormal gait, decreased activity

## 2021-07-01 NOTE — Addendum Note (Signed)
Addended by: Kearney Hard R on: 07/01/2021 03:51 PM ? ? Modules accepted: Orders ? ?

## 2021-07-02 ENCOUNTER — Encounter: Payer: Self-pay | Admitting: Rehabilitative and Restorative Service Providers"

## 2021-07-02 ENCOUNTER — Ambulatory Visit (INDEPENDENT_AMBULATORY_CARE_PROVIDER_SITE_OTHER): Payer: Medicare Other | Admitting: Rehabilitative and Restorative Service Providers"

## 2021-07-02 DIAGNOSIS — R600 Localized edema: Secondary | ICD-10-CM

## 2021-07-02 DIAGNOSIS — G8929 Other chronic pain: Secondary | ICD-10-CM

## 2021-07-02 DIAGNOSIS — M25661 Stiffness of right knee, not elsewhere classified: Secondary | ICD-10-CM

## 2021-07-02 DIAGNOSIS — M6281 Muscle weakness (generalized): Secondary | ICD-10-CM

## 2021-07-02 DIAGNOSIS — M170 Bilateral primary osteoarthritis of knee: Secondary | ICD-10-CM | POA: Diagnosis not present

## 2021-07-02 DIAGNOSIS — R2689 Other abnormalities of gait and mobility: Secondary | ICD-10-CM

## 2021-07-02 NOTE — Therapy (Signed)
?OUTPATIENT PHYSICAL THERAPY TREATMENT NOTE ? ? ?Patient Name: Krystal Lang ?MRN: [id number] ?DOB:Aug 09, 1955, 66 y.o., female ?Today's Date: 07/02/2021 ? ?PCP: Dixie Dials, MD ?REFERRING PROVIDER: Lynnell Jude MD ? ? PT End of Session - 07/02/21 1306   ? ? Visit Number 5   ? Number of Visits 24   ? Date for PT Re-Evaluation 08/23/21   ? Authorization Type Medicare, KX modifier starting at visit 11 (pt had 4 pre-op visits)   ? Progress Note Due on Visit 10   ? PT Start Time 1301   ? PT Stop Time 3419   ? PT Time Calculation (min) 53 min   ? Activity Tolerance Patient tolerated treatment well   ? Behavior During Therapy [facility] for tasks assessed/performed   ? ?  ?  ? ?  ? ? ? ? ?Past Medical History:  ?Diagnosis Date  ? Cataract   ? Mixed form OU  ? Diabetes mellitus   ? Fibroid uterus 2001  ? H/O fatigue 1998  ? Headache(784.0)   ? Hirsutism 1998  ? Idiopathic  ? History of chicken pox   ? Hypothyroidism   ? Menses, irregular 1998  ? ?Past Surgical History:  ?Procedure Laterality Date  ? DILATION AND CURETTAGE OF UTERUS  2002  ? In [place]  ? KNEE SURGERY    ? TONSILECTOMY, ADENOIDECTOMY, BILATERAL MYRINGOTOMY AND TUBES    ? TONSILLECTOMY    ? ?Patient Active Problem List  ? Diagnosis Date Noted  ? Bilateral primary osteoarthritis of knee 05/11/2017  ? Dyspareunia, female 11/13/2011  ? Type 2 diabetes mellitus ([place]) 11/13/2011  ? Alopecia 11/13/2011  ? History of chicken pox   ? Headache(784.0)   ? Hypothyroidism   ? Menses, irregular   ? Hirsutism   ? H/O fatigue   ? Fibroid uterus   ? ? ?REFERRING DIAG: Z96.659 (ICD-10-CM) - S/P total knee arthroplasty 06/05/2021 ? ?THERAPY DIAG:  ?Stiffness of right knee, not elsewhere classified ? ?Bilateral primary osteoarthritis of knee ? ?Chronic pain of right knee ? ?Other abnormalities of gait and mobility ? ?Muscle weakness (generalized) ? ?Localized edema ? ?PERTINENT HISTORY: RT TKA on 06/05/2021 ? ?PRECAUTIONS: None ? ?SUBJECTIVE: [name] reports she is sleeping much  better.  She is still taking a lot of pain medication and we discussed working on cutting back with her pain medication this week. ? ?PAIN:  ?Are you having pain? Yes: NPRS scale: Ranging 2/10 to 8/10 ?Pain location: knee anterior ?Pain description: intermittent heavy, tightness, sometimes sharp ?Aggravating factors: change position sleeping  ?Relieving factors: ice, meds Rx ? ?OBJECTIVE:  ?  ?DIAGNOSTIC FINDINGS: prior to surgery: significant tricompartmental arthritis predominantly in the medial compartment where there are areas of complete cartilage loss.  There is also a radial tear of the medial meniscus posteriorly. ?  ?PATIENT SURVEYS:  ?06/24/2021: FOTO 47% (predicted 62%) ?  ?POSTURE:  Rounded shoulders ?  ?PALPATION: ?06/24/2021 TTP: medial joint line, pt reporting lateral numbness ?           ?EDEMA: Rt: 43 centimeters, Left 38.5 centimeters ?  ?LE ROM: ?  ?Active ROM Right ?06/24/21 Left ?06/24/21 Right ?06/28/21 Right ?07/01/21  ?Knee flexion 90 112 98 101* ?supine  ?Knee extension -5 -2 -2 -2* ?supine  ?(Blank rows = not tested) ?  ?Passive ROM Rt ?06/24/21 Lt ?06/24/21 Rt ?06/28/21  ?Knee flexion 95 115 104  ?Knee extension -3 -2 -1  ?  ?  ?LE MMT: ?  ?MMT Right ?06/24/21  Left ?06/24/21  ?Hip flexion 5/5 5/5  ?Hip extension      ?Hip abduction      ?Hip adduction 5/5 5/5  ?Hip internal rotation      ?Hip external rotation      ?Knee flexion 4-/5 5/5  ?Knee extension 3+/5 5/5  ?Ankle dorsiflexion      ?Ankle plantarflexion      ?Ankle inversion      ?Ankle eversion      ?(Blank rows = not tested) ?  ?FUNCTIONAL TESTS:  ?06/24/2021: 5 times sit to stand: 14 seconds with UE support with walker in front if needed.  ?  ?GAIT: ?06/24/2021  Distance walked: amb with rolling walker with mild antalgic gait with decreased terminal knee extension on Rt LE ?Assistive device utilized: Environmental consultant - 2 wheeled ?Level of assistance: Modified independence ?Comments: see above ? ?TODAY'S TREATMENT: ?07/02/2021 ?Nustep level 5 seat 8 with  BUEs & BLEs for 8 min ?Seated straight leg raises 3 sets of 5 ? ?Functional Activities:  ?Leg Press B full extension to stretch into flexion 15X @ 75# ?R LE only 37# 10X same parameters (full extension and stretch into flexion) ?Step-up and over 4 inch step slow eccentrics ? ?Neuromusclar re-education: ?Tandem balance 6X 20 seconds ? ?Vasopneumatic right knee medium compression 34* 10 min. ? ? ?07/01/2021 ?Therapeutic Exercise: ? Nustep level 5 seat 8 with BUEs & BLEs for 10 min ? Seated heel slides with foot on pillow case to reduce resistance - 5 sec hold ext / quad set & max AROM knee flex with contralateral LE flexing few more degrees 5 sec hold 20 reps. ? Seated LAQ & active knee flex 5 sec hold ea and contralateral LE opposite motion 20 reps. ? PT demo & verbal cues on towel roll under ankle when propping in 2nd chair and intermittently try to press leg outward extending knee. Prevent external rotation (not letting foot roll out).  Pt verbalized understanding.  ? ?Pt ambulated in clinic without device safely.  Pt recommended short distances like in home without device. Pt verb ? ?Self-care:  PT demo & verbal cues to pt & husband in ice massage. Pt verbalized understanding & return demo understanding.   ?Sitting on 24" bar stool to perform ADLs in kitchen to alternate with standing enabling counter top activities without full weight bearing on LEs. Pt verbalized understanding.   ? ?Manual therapy:  PROM for knee flex & ext.  ? ?Vasopneumatic right knee medium compression 34* 10 min. ? ? ?06/28/21 ?Therapeutic Exercise: ?Aerobic: Nu step LE/UE L5 X10 min seat#9 ?Supine: ?Prone: ?Seated: knee flexion AAROM tailgate stretch 5 sec X 15. LAQ X15 on Rt. Seated SLR X 15 on Rt ?Standing: bilat gastroc stretch on slantboard 30 sec X 3. Bilat hip abd X10, bilat H.S curls X10. Bilat hip marches X10 ?Machines: ?Neuromuscular Re-education: ?Manual Therapy:Rt knee PROM with overpressure to tolerance ?Therapeutic  Activity: ?Modalities: Vasopneumatic right knee medium compression 34* 10 min. ? ?  ?PATIENT EDUCATION:  ?Education details: PT POC, HEP ?Person educated: Patient ?Education method: Explanation, Demonstration, Tactile cues, Verbal cues, and Handouts ?Education comprehension: verbalized understanding and returned demonstration ?  ?  ?HOME EXERCISE PROGRAM: ?Access Code: [email or web address] ?URL: [email or web address] ?Date: 06/24/2021 ?Prepared by: Kearney Hard ?  ?Exercises ?- Seated Long Arc Quad  - 3 x daily - 7 x weekly - 2 sets - 10 reps ?- Long Sitting Quad Set with Towel Roll Under Heel  - 3 x daily - 7 x  weekly - 2 sets - 10 reps - 5-10 seconds hold ?- Supine Heel Slide with Strap  - 3 x daily - 7 x weekly - 2 sets - 10 reps - 5 seconds hold ?- Sit to Stand with Counter Support  - 3 x daily - 7 x weekly - 2 sets - 10 reps ?  ?ASSESSMENT: ?  ?CLINICAL IMPRESSION: ?[name] is making great early progress with her post-surgical PT.  Extension AROM and edema are very good.  Flexion AROM and quadriceps strength need additional work.  Balance and functional activities were also progressed today and will be an emphasis with continued work. ?  ?OBJECTIVE IMPAIRMENTS Abnormal gait, decreased activity tolerance, decreased balance, decreased mobility, difficulty walking, decreased ROM, decreased strength, increased edema, impaired flexibility, postural dysfunction, and pain.  ?  ?ACTIVITY LIMITATIONS cleaning and community activity.  ?  ?PERSONAL FACTORS 3+ comorbidities: DM, hypothyroidism, HA  are also affecting patient's functional outcome.  ?  ?REHAB POTENTIAL: Good ?  ?CLINICAL DECISION MAKING: Stable/uncomplicated ?  ?EVALUATION COMPLEXITY: Low ?  ?  ?GOALS: ?Goals reviewed with patient? Yes ?Short term PT Goals (target date for Short term goals are 4 weeks 07/26/2021) ?Patient will demonstrate independent use of home exercise program to maintain progress from in clinic treatments. ?Goal status: New ?  ?Long  term PT goals (target dates for all long term goals are 8 weeks  08/23/2021 ) ?Patient will demonstrate/report pain at worst less than or equal to 2/10 to facilitate minimal limitation in daily activity secondary to pain

## 2021-07-03 ENCOUNTER — Other Ambulatory Visit: Payer: Self-pay

## 2021-07-03 ENCOUNTER — Encounter: Payer: Self-pay | Admitting: Physical Therapy

## 2021-07-03 ENCOUNTER — Ambulatory Visit (INDEPENDENT_AMBULATORY_CARE_PROVIDER_SITE_OTHER): Payer: Medicare Other | Admitting: Physical Therapy

## 2021-07-03 DIAGNOSIS — R2689 Other abnormalities of gait and mobility: Secondary | ICD-10-CM | POA: Diagnosis not present

## 2021-07-03 DIAGNOSIS — R600 Localized edema: Secondary | ICD-10-CM

## 2021-07-03 DIAGNOSIS — M25561 Pain in right knee: Secondary | ICD-10-CM | POA: Diagnosis not present

## 2021-07-03 DIAGNOSIS — M25661 Stiffness of right knee, not elsewhere classified: Secondary | ICD-10-CM

## 2021-07-03 DIAGNOSIS — M6281 Muscle weakness (generalized): Secondary | ICD-10-CM | POA: Diagnosis not present

## 2021-07-03 DIAGNOSIS — G8929 Other chronic pain: Secondary | ICD-10-CM

## 2021-07-03 NOTE — Therapy (Signed)
?OUTPATIENT PHYSICAL THERAPY TREATMENT NOTE ? ? ?Patient Name: Krystal Lang ?MRN: [id number] ?DOB:[birth year], 66 y.o., female ?Today's Date: 07/03/2021 ? ?PCP: Dixie Dials, MD ?REFERRING PROVIDER: Lynnell Jude MD ? ? PT End of Session - 07/03/21 1522   ? ? Visit Number 6   ? Number of Visits 24   ? Date for PT Re-Evaluation 08/23/21   ? Authorization Type Medicare, KX modifier starting at visit 11 (pt had 4 pre-op visits)   ? Progress Note Due on Visit 10   ? PT Start Time 1519   ? PT Stop Time 1615   ? PT Time Calculation (min) 56 min   ? Activity Tolerance Patient tolerated treatment well   ? Behavior During Therapy [facility] for tasks assessed/performed   ? ?  ?  ? ?  ? ? ? ? ? ?Past Medical History:  ?Diagnosis Date  ? Cataract   ? Mixed form OU  ? Diabetes mellitus   ? Fibroid uterus 2001  ? H/O fatigue 1998  ? Headache(784.0)   ? Hirsutism 1998  ? Idiopathic  ? History of chicken pox   ? Hypothyroidism   ? Menses, irregular 1998  ? ?Past Surgical History:  ?Procedure Laterality Date  ? DILATION AND CURETTAGE OF UTERUS  2002  ? In [place]  ? KNEE SURGERY    ? TONSILECTOMY, ADENOIDECTOMY, BILATERAL MYRINGOTOMY AND TUBES    ? TONSILLECTOMY    ? ?Patient Active Problem List  ? Diagnosis Date Noted  ? Bilateral primary osteoarthritis of knee 05/11/2017  ? Dyspareunia, female 11/13/2011  ? Type 2 diabetes mellitus ([place]) 11/13/2011  ? Alopecia 11/13/2011  ? History of chicken pox   ? Headache(784.0)   ? Hypothyroidism   ? Menses, irregular   ? Hirsutism   ? H/O fatigue   ? Fibroid uterus   ? ? ?REFERRING DIAG: Z96.659 (ICD-10-CM) - S/P total knee arthroplasty 06/05/2021 ? ?THERAPY DIAG:  ?Stiffness of right knee, not elsewhere classified ? ?Chronic pain of right knee ? ?Other abnormalities of gait and mobility ? ?Muscle weakness (generalized) ? ?Localized edema ? ?PERTINENT HISTORY: RT TKA on 06/05/2021 ? ?PRECAUTIONS: None ? ?SUBJECTIVE: She continues exercises. She walks without device in home.   ? ?PAIN:  ?Are  you having pain? Yes: NPRS scale: arrived 2/10 and Ranging 2/10 to 4/10 ?Pain location: knee anterior ?Pain description: intermittent heavy, tightness, sometimes sharp ?Aggravating factors: change position sleeping  ?Relieving factors: ice, meds Rx ? ?OBJECTIVE:  ?  ?DIAGNOSTIC FINDINGS: prior to surgery: significant tricompartmental arthritis predominantly in the medial compartment where there are areas of complete cartilage loss.  There is also a radial tear of the medial meniscus posteriorly. ?  ?PATIENT SURVEYS:  ?06/24/2021: FOTO 47% (predicted 62%) ?  ?POSTURE:  Rounded shoulders ?  ?PALPATION: ?06/24/2021 TTP: medial joint line, pt reporting lateral numbness ?           ?EDEMA: Rt: 43 centimeters, Left 38.5 centimeters ?  ?LE ROM: ?  ?Active ROM Right ?06/24/21 Left ?06/24/21 Right ?06/28/21 Right ?07/01/21 Right ?07/03/21  ?Knee flexion 90 112 98 101* ?supine 103* ?supine  ?Knee extension -5 -2 -2 -2* ?supine -3* ?Seated LAQ  ?(Blank rows = not tested) ?  ?Passive ROM Rt ?06/24/21 Lt ?06/24/21 Rt ?06/28/21 Right ?07/03/21  ?Knee flexion 95 115 104 115* ?seated  ?Knee extension -3 -2 -1 -1*  ?  ?  ?LE MMT: ?  ?MMT Right ?06/24/21 Left ?06/24/21  ?Hip flexion 5/5 5/5  ?Hip  extension      ?Hip abduction      ?Hip adduction 5/5 5/5  ?Hip internal rotation      ?Hip external rotation      ?Knee flexion 4-/5 5/5  ?Knee extension 3+/5 5/5  ?Ankle dorsiflexion      ?Ankle plantarflexion      ?Ankle inversion      ?Ankle eversion      ?(Blank rows = not tested) ?  ?FUNCTIONAL TESTS:  ?06/24/2021: 5 times sit to stand: 14 seconds with UE support with walker in front if needed.  ?  ?GAIT: ?06/24/2021  Distance walked: amb with rolling walker with mild antalgic gait with decreased terminal knee extension on Rt LE ?Assistive device utilized: Environmental consultant - 2 wheeled ?Level of assistance: Modified independence ?Comments: see above ? ?TODAY'S TREATMENT: ?07/03/2021 ?SciFit seat 9 level 1 with BUEs & BLEs for 8 min ?Seated LAQ 2# and AROM flexion  with contralateral LE opposite motion 15 reps 2 sets. ?Step-up & step-down 4 inch step BUE support 10 reps.  ? ?Neuromusclar re-education: ?Tandem balance on foam beam 30 seconds 2 reps ea RLE in front & in back; requires intermittent touch.  ?  ?Manual Therapy: PROM with end range stretch for knee flexion & extension ?Vasopneumatic right knee medium compression 34* 10 min. ? ?07/02/2021 ?Nustep level 5 seat 8 with BUEs & BLEs for 8 min ?Seated straight leg raises 3 sets of 5 ? ?Functional Activities:  ?Leg Press B full extension to stretch into flexion 15X @ 75# ?R LE only 37# 10X same parameters (full extension and stretch into flexion) ?Step-up and over 4 inch step slow eccentrics ? ?Neuromusclar re-education: ?Tandem balance 6X 20 seconds ? ?Vasopneumatic right knee medium compression 34* 10 min. ? ? ?07/01/2021 ?Therapeutic Exercise: ? Nustep level 5 seat 8 with BUEs & BLEs for 10 min ? Seated heel slides with foot on pillow case to reduce resistance - 5 sec hold ext / quad set & max AROM knee flex with contralateral LE flexing few more degrees 5 sec hold 20 reps. ? Seated LAQ & active knee flex 5 sec hold ea and contralateral LE opposite motion 20 reps. ? PT demo & verbal cues on towel roll under ankle when propping in 2nd chair and intermittently try to press leg outward extending knee. Prevent external rotation (not letting foot roll out).  Pt verbalized understanding.  ? ?Pt ambulated in clinic without device safely.  Pt recommended short distances like in home without device. Pt verb ? ?Self-care:  PT demo & verbal cues to pt & husband in ice massage. Pt verbalized understanding & return demo understanding.   ?Sitting on 24" bar stool to perform ADLs in kitchen to alternate with standing enabling counter top activities without full weight bearing on LEs. Pt verbalized understanding.   ? ?Manual therapy:  PROM for knee flex & ext.  ? ?Vasopneumatic right knee medium compression 34* 10 min. ? ? ?  ?PATIENT  EDUCATION:  ?Education details: PT POC, HEP ?Person educated: Patient ?Education method: Explanation, Demonstration, Tactile cues, Verbal cues, and Handouts ?Education comprehension: verbalized understanding and returned demonstration ?  ?  ?HOME EXERCISE PROGRAM: ?Access Code: [email or web address] ?URL: [email or web address] ?Date: 06/24/2021 ?Prepared by: Kearney Hard ?  ?Exercises ?- Seated Long Arc Quad  - 3 x daily - 7 x weekly - 2 sets - 10 reps ?- Long Sitting Quad Set with Towel Roll Under Heel  - 3 x daily - 7 x weekly -  2 sets - 10 reps - 5-10 seconds hold ?- Supine Heel Slide with Strap  - 3 x daily - 7 x weekly - 2 sets - 10 reps - 5 seconds hold ?- Sit to Stand with Counter Support  - 3 x daily - 7 x weekly - 2 sets - 10 reps ?  ?ASSESSMENT: ?  ?CLINICAL IMPRESSION: ?Patient has improved her range & functional strength. Her pain is ranging at lower level with activities.   Patient continues to benefit from skilled PT to maximize her mobility.  ?  ?OBJECTIVE IMPAIRMENTS Abnormal gait, decreased activity tolerance, decreased balance, decreased mobility, difficulty walking, decreased ROM, decreased strength, increased edema, impaired flexibility, postural dysfunction, and pain.  ?  ?ACTIVITY LIMITATIONS cleaning and community activity.  ?  ?PERSONAL FACTORS 3+ comorbidities: DM, hypothyroidism, HA  are also affecting patient's functional outcome.  ?  ?REHAB POTENTIAL: Good ?  ?CLINICAL DECISION MAKING: Stable/uncomplicated ?  ?EVALUATION COMPLEXITY: Low ?  ?  ?GOALS: ?Goals reviewed with patient? Yes ?Short term PT Goals (target date for Short term goals are 4 weeks 07/26/2021) ?Patient will demonstrate independent use of home exercise program to maintain progress from in clinic treatments. ?Goal status: New ?  ?Long term PT goals (target dates for all long term goals are 8 weeks  08/23/2021 ) ?Patient will demonstrate/report pain at worst less than or equal to 2/10 to facilitate minimal limitation in  daily activity secondary to pain symptoms. ?Goal status: New ?  ?Patient will demonstrate independent use of home exercise program to facilitate ability to maintain/progress functional gains from skilled physic

## 2021-07-08 ENCOUNTER — Other Ambulatory Visit: Payer: Self-pay

## 2021-07-08 ENCOUNTER — Encounter: Payer: Self-pay | Admitting: Physical Therapy

## 2021-07-08 ENCOUNTER — Ambulatory Visit (INDEPENDENT_AMBULATORY_CARE_PROVIDER_SITE_OTHER): Payer: Medicare Other | Admitting: Physical Therapy

## 2021-07-08 DIAGNOSIS — M6281 Muscle weakness (generalized): Secondary | ICD-10-CM

## 2021-07-08 DIAGNOSIS — M25561 Pain in right knee: Secondary | ICD-10-CM

## 2021-07-08 DIAGNOSIS — R2689 Other abnormalities of gait and mobility: Secondary | ICD-10-CM | POA: Diagnosis not present

## 2021-07-08 DIAGNOSIS — M25661 Stiffness of right knee, not elsewhere classified: Secondary | ICD-10-CM | POA: Diagnosis not present

## 2021-07-08 DIAGNOSIS — G8929 Other chronic pain: Secondary | ICD-10-CM

## 2021-07-08 DIAGNOSIS — R600 Localized edema: Secondary | ICD-10-CM

## 2021-07-08 NOTE — Therapy (Signed)
?OUTPATIENT PHYSICAL THERAPY TREATMENT NOTE ? ? ?Patient Name: Krystal Lang ?MRN: [id number] ?DOB:[birth year], 66 y.o., female ?Today's Date: 07/08/2021 ? ?PCP: Dixie Dials, MD ?REFERRING PROVIDER: Lynnell Jude MD ? ? PT End of Session - 07/08/21 1158   ? ? Visit Number 7   ? Number of Visits 24   ? Date for PT Re-Evaluation 08/23/21   ? Authorization Type Medicare, KX modifier starting at visit 11 (pt had 4 pre-op visits)   ? Progress Note Due on Visit 10   ? PT Start Time 1155   ? PT Stop Time 2440   ? PT Time Calculation (min) 48 min   ? Activity Tolerance Patient tolerated treatment well   ? Behavior During Therapy [facility] for tasks assessed/performed   ? ?  ?  ? ?  ? ? ? ? ? ? ?Past Medical History:  ?Diagnosis Date  ? Cataract   ? Mixed form OU  ? Diabetes mellitus   ? Fibroid uterus 2001  ? H/O fatigue 1998  ? Headache(784.0)   ? Hirsutism 1998  ? Idiopathic  ? History of chicken pox   ? Hypothyroidism   ? Menses, irregular 1998  ? ?Past Surgical History:  ?Procedure Laterality Date  ? DILATION AND CURETTAGE OF UTERUS  2002  ? In [place]  ? KNEE SURGERY    ? TONSILECTOMY, ADENOIDECTOMY, BILATERAL MYRINGOTOMY AND TUBES    ? TONSILLECTOMY    ? ?Patient Active Problem List  ? Diagnosis Date Noted  ? Bilateral primary osteoarthritis of knee 05/11/2017  ? Dyspareunia, female 11/13/2011  ? Type 2 diabetes mellitus ([place]) 11/13/2011  ? Alopecia 11/13/2011  ? History of chicken pox   ? Headache(784.0)   ? Hypothyroidism   ? Menses, irregular   ? Hirsutism   ? H/O fatigue   ? Fibroid uterus   ? ? ?REFERRING DIAG: Z96.659 (ICD-10-CM) - S/P total knee arthroplasty 06/05/2021 ? ?THERAPY DIAG:  ?Stiffness of right knee, not elsewhere classified ? ?Chronic pain of right knee ? ?Other abnormalities of gait and mobility ? ?Muscle weakness (generalized) ? ?Localized edema ? ?PERTINENT HISTORY: RT TKA on 06/05/2021 ? ?PRECAUTIONS: None ? ?SUBJECTIVE: Her sleeping has improved except one night after standing longer. She  awakens one time due to pain and takes tylenol & Benadryl. She walks without device in home except at night  ? ?PAIN:  ?Are you having pain?  Yes: NPRS scale: arrived 3/10 and Ranging 2/10 to 7/10 ?Pain location: knee anterior ?Pain description: intermittent heavy, tightness, sometimes sharp ?Aggravating factors: change position sleeping  ?Relieving factors: ice, meds Rx ? ?OBJECTIVE:  ?  ?DIAGNOSTIC FINDINGS: prior to surgery: significant tricompartmental arthritis predominantly in the medial compartment where there are areas of complete cartilage loss.  There is also a radial tear of the medial meniscus posteriorly. ?  ?PATIENT SURVEYS:  ?06/24/2021: FOTO 47% (predicted 62%) ?  ?POSTURE:  Rounded shoulders ?  ?PALPATION: ?06/24/2021 TTP: medial joint line, pt reporting lateral numbness ?           ?EDEMA: Rt: 43 centimeters, Left 38.5 centimeters ?  ?LE ROM: ?  ?Active ROM Right ?06/24/21 Left ?06/24/21 Right ?06/28/21 Right ?07/01/21 Right ?07/03/21  ?Knee flexion 90 112 98 101* ?supine 103* ?supine  ?Knee extension -5 -2 -2 -2* ?supine -3* ?Seated LAQ  ?(Blank rows = not tested) ?  ?Passive ROM Rt ?06/24/21 Lt ?06/24/21 Rt ?06/28/21 Right ?07/03/21  ?Knee flexion 95 115 104 115* ?seated  ?Knee extension -3 -2 -  1 -1*  ?  ?  ?LE MMT: ?  ?MMT Right ?06/24/21 Left ?06/24/21  ?Hip flexion 5/5 5/5  ?Hip extension      ?Hip abduction      ?Hip adduction 5/5 5/5  ?Hip internal rotation      ?Hip external rotation      ?Knee flexion 4-/5 5/5  ?Knee extension 3+/5 5/5  ?Ankle dorsiflexion      ?Ankle plantarflexion      ?Ankle inversion      ?Ankle eversion      ?(Blank rows = not tested) ?  ?FUNCTIONAL TESTS:  ?06/24/2021: 5 times sit to stand: 14 seconds with UE support with walker in front if needed.  ?  ?GAIT: ?06/24/2021  Distance walked: amb with rolling walker with mild antalgic gait with decreased terminal knee extension on Rt LE ?Assistive device utilized: Environmental consultant - 2 wheeled ?Level of assistance: Modified independence ?Comments:  see above ? ?TODAY'S TREATMENT: ?07/08/2021 ?SciFit seat 9 level 1 with BUEs & BLEs for 8 min ?Seated LAQ 3# and AROM flexion with contralateral LE opposite motion 15 reps 2 sets. ?Step-up & step-down 6 inch step BUE support 10 reps.  ?Leg Press BLEs 87# 15 reps 2 sets 37# 15 reps 1 set ?Hamstring stretch SLR w/strap DF 30 sec 2 reps ?Prone knee flexion then hip ext 10 reps ?Stairs alternating pattern with 2 rails 11 steps 1 rep with PT demo & verbal cues.  ? ?Neuromusclar re-education: ?Tandem balance on foam beam 30 seconds 2 reps ea RLE in front & in back; requires intermittent touch.  ?Stepping towards 3 target cones (ant-lat, lat & post-lat) reps ea LE lead.  ?  ?PT recommended sitting in car in park & try to move RLE gas to/from brake.  Pt verbalized understanding.  ?Manual Therapy:  ?Vasopneumatic right knee medium compression 34* 10 min. ? ?07/03/2021 ?SciFit seat 9 level 1 with BUEs & BLEs for 8 min ?Seated LAQ 2# and AROM flexion with contralateral LE opposite motion 15 reps 2 sets. ?Step-up & step-down 4 inch step BUE support 10 reps.  ? ?Neuromusclar re-education: ?Tandem balance on foam beam 30 seconds 2 reps ea RLE in front & in back; requires intermittent touch.  ?  ?Manual Therapy: PROM with end range stretch for knee flexion & extension ?Vasopneumatic right knee medium compression 34* 10 min. ? ?07/02/2021 ?Nustep level 5 seat 8 with BUEs & BLEs for 8 min ?Seated straight leg raises 3 sets of 5 ? ?Functional Activities:  ?Leg Press B full extension to stretch into flexion 15X @ 75# ?R LE only 37# 10X same parameters (full extension and stretch into flexion) ?Step-up and over 4 inch step slow eccentrics ? ?Neuromusclar re-education: ?Tandem balance 6X 20 seconds ? ?Vasopneumatic right knee medium compression 34* 10 min. ? ? ?  ?PATIENT EDUCATION:  ?Education details: PT POC, HEP ?Person educated: Patient ?Education method: Explanation, Demonstration, Tactile cues, Verbal cues, and Handouts ?Education  comprehension: verbalized understanding and returned demonstration ?  ?  ?HOME EXERCISE PROGRAM: ?Access Code: [email or web address] ?URL: [email or web address] ?Date: 06/24/2021 ?Prepared by: Kearney Hard ?  ?Exercises ?- Seated Long Arc Quad  - 3 x daily - 7 x weekly - 2 sets - 10 reps ?- Long Sitting Quad Set with Towel Roll Under Heel  - 3 x daily - 7 x weekly - 2 sets - 10 reps - 5-10 seconds hold ?- Supine Heel Slide with Strap  - 3 x daily - 7 x weekly -  2 sets - 10 reps - 5 seconds hold ?- Sit to Stand with Counter Support  - 3 x daily - 7 x weekly - 2 sets - 10 reps ?  ?ASSESSMENT: ?  ?CLINICAL IMPRESSION: ?Patient was able to tolerate increased range. PT worked on stairs alternating pattern but required 2 rails.  Pt continues to benefit from skilled PT.  ?  ?OBJECTIVE IMPAIRMENTS Abnormal gait, decreased activity tolerance, decreased balance, decreased mobility, difficulty walking, decreased ROM, decreased strength, increased edema, impaired flexibility, postural dysfunction, and pain.  ?  ?ACTIVITY LIMITATIONS cleaning and community activity.  ?  ?PERSONAL FACTORS 3+ comorbidities: DM, hypothyroidism, HA  are also affecting patient's functional outcome.  ?  ?REHAB POTENTIAL: Good ?  ?CLINICAL DECISION MAKING: Stable/uncomplicated ?  ?EVALUATION COMPLEXITY: Low ?  ?  ?GOALS: ?Goals reviewed with patient? Yes ?Short term PT Goals (target date for Short term goals are 4 weeks 07/26/2021) ?Patient will demonstrate independent use of home exercise program to maintain progress from in clinic treatments. ?Goal status: New ?  ?Long term PT goals (target dates for all long term goals are 8 weeks  08/23/2021 ) ?Patient will demonstrate/report pain at worst less than or equal to 2/10 to facilitate minimal limitation in daily activity secondary to pain symptoms. ?Goal status: New ?  ?Patient will demonstrate independent use of home exercise program to facilitate ability to maintain/progress functional gains from  skilled physical therapy services. ?Goal status: New ?  ?Patient will demonstrate FOTO outcome > or = 62 % to indicate reduced disability due to condition. ?Goal status: 47% on 06/24/2021 at eval ?  ?Pt will be ab

## 2021-07-10 ENCOUNTER — Ambulatory Visit (INDEPENDENT_AMBULATORY_CARE_PROVIDER_SITE_OTHER): Payer: Medicare Other | Admitting: Physical Therapy

## 2021-07-10 ENCOUNTER — Encounter: Payer: Self-pay | Admitting: Physical Therapy

## 2021-07-10 DIAGNOSIS — G8929 Other chronic pain: Secondary | ICD-10-CM

## 2021-07-10 DIAGNOSIS — R600 Localized edema: Secondary | ICD-10-CM

## 2021-07-10 DIAGNOSIS — M25561 Pain in right knee: Secondary | ICD-10-CM | POA: Diagnosis not present

## 2021-07-10 DIAGNOSIS — M6281 Muscle weakness (generalized): Secondary | ICD-10-CM | POA: Diagnosis not present

## 2021-07-10 DIAGNOSIS — R2689 Other abnormalities of gait and mobility: Secondary | ICD-10-CM | POA: Diagnosis not present

## 2021-07-10 DIAGNOSIS — M25661 Stiffness of right knee, not elsewhere classified: Secondary | ICD-10-CM

## 2021-07-10 NOTE — Therapy (Signed)
?OUTPATIENT PHYSICAL THERAPY TREATMENT NOTE ? ? ?Patient Name: Krystal Lang ?MRN: [id number] ?DOB:17-Jul-1955, 66 y.o., female ?Today's Date: 07/10/2021 ? ?PCP: Dixie Dials, MD ?REFERRING PROVIDER: Lynnell Jude MD ? ? PT End of Session - 07/10/21 1157   ? ? Visit Number 8   ? Number of Visits 24   ? Date for PT Re-Evaluation 08/23/21   ? Authorization Type Medicare, KX modifier starting at visit 11 (pt had 4 pre-op visits)   ? Progress Note Due on Visit 10   ? PT Start Time 0845   ? PT Stop Time 0930   ? PT Time Calculation (min) 45 min   ? Activity Tolerance Patient tolerated treatment well   ? Behavior During Therapy [facility] for tasks assessed/performed   ? ?  ?  ? ?  ? ? ? ? ? ? ? ?Past Medical History:  ?Diagnosis Date  ? Cataract   ? Mixed form OU  ? Diabetes mellitus   ? Fibroid uterus 2001  ? H/O fatigue 1998  ? Headache(784.0)   ? Hirsutism 1998  ? Idiopathic  ? History of chicken pox   ? Hypothyroidism   ? Menses, irregular 1998  ? ?Past Surgical History:  ?Procedure Laterality Date  ? DILATION AND CURETTAGE OF UTERUS  2002  ? In [place]  ? KNEE SURGERY    ? TONSILECTOMY, ADENOIDECTOMY, BILATERAL MYRINGOTOMY AND TUBES    ? TONSILLECTOMY    ? ?Patient Active Problem List  ? Diagnosis Date Noted  ? Bilateral primary osteoarthritis of knee 05/11/2017  ? Dyspareunia, female 11/13/2011  ? Type 2 diabetes mellitus ([place]) 11/13/2011  ? Alopecia 11/13/2011  ? History of chicken pox   ? Headache(784.0)   ? Hypothyroidism   ? Menses, irregular   ? Hirsutism   ? H/O fatigue   ? Fibroid uterus   ? ? ?REFERRING DIAG: Z96.659 (ICD-10-CM) - S/P total knee arthroplasty 06/05/2021 ? ?THERAPY DIAG:  ?Stiffness of right knee, not elsewhere classified ? ?Chronic pain of right knee ? ?Other abnormalities of gait and mobility ? ?Muscle weakness (generalized) ? ?Localized edema ? ?PERTINENT HISTORY: RT TKA on 06/05/2021 ? ?PRECAUTIONS: None ? ?SUBJECTIVE: Pt arriving today reporting 3-4/10 pain today in her left knee. Pt  reporting more joint line both medial and lateral pain.  ? ? ? ?PAIN:  ?Are you having pain?  Yes: NPRS scale: arrived 3-4/10 and Ranging 2/10 to 7/10 ?Pain location: knee anterior ?Pain description: intermittent heavy, tightness, sometimes sharp ?Aggravating factors: change position sleeping  ?Relieving factors: ice, meds Rx ? ?OBJECTIVE:  ?  ?DIAGNOSTIC FINDINGS: prior to surgery: significant tricompartmental arthritis predominantly in the medial compartment where there are areas of complete cartilage loss.  There is also a radial tear of the medial meniscus posteriorly. ?  ?PATIENT SURVEYS:  ?06/24/2021: FOTO 47% (predicted 62%) ?  ?POSTURE:  Rounded shoulders ?  ?PALPATION: ?06/24/2021 TTP: medial joint line, pt reporting lateral numbness ?           ?EDEMA: Rt: 43 centimeters, Left 38.5 centimeters ?  ?LE ROM: ?  ?Active ROM Right ?06/24/21 Left ?06/24/21 Right ?06/28/21 Right ?07/01/21 Right ?07/03/21 Rt ?07/10/21  ?Knee flexion 90 112 98 101* ?supine 103* ?supine 104  ?supine  ?Knee extension -5 -2 -2 -2* ?supine -3* ?Seated LAQ -2 ?supine  ?(Blank rows = not tested) ?  ?Passive ROM Rt ?06/24/21 Lt ?06/24/21 Rt ?06/28/21 Right ?07/03/21  ?Knee flexion 95 115 104 115* ?seated  ?Knee extension -3 -2 -  1 -1*  ?  ?  ?LE MMT: ?  ?MMT Right ?06/24/21 Left ?06/24/21  ?Hip flexion 5/5 5/5  ?Hip extension      ?Hip abduction      ?Hip adduction 5/5 5/5  ?Hip internal rotation      ?Hip external rotation      ?Knee flexion 4-/5 5/5  ?Knee extension 3+/5 5/5  ?Ankle dorsiflexion      ?Ankle plantarflexion      ?Ankle inversion      ?Ankle eversion      ?(Blank rows = not tested) ?  ?FUNCTIONAL TESTS:  ?06/24/2021: 5 times sit to stand: 14 seconds with UE support with walker in front if needed.  ?  ?GAIT: ?06/24/2021  Distance walked: amb with rolling walker with mild antalgic gait with decreased terminal knee extension on Rt LE ?Assistive device utilized: Environmental consultant - 2 wheeled ?Level of assistance: Modified independence ?Comments: see  above ? ?TODAY'S TREATMENT: ?07/10/2021:  ?Recumbent bike L2 x 8 minutes, seat at 6  ?Slant board stretch x 2 gastroc stretch x 30 seconds ?Step ups 6 inch step x 15 single UE support ?TRX squats 2 x10  ? ?Neuro-re-education:  ?Airex: standing feet apart, feet togheter, head turns, reaching upward, eyes closed all with CGA to close supervision  ?Airex beam x 6 with finger touch intermittent support ? ?Modalities:  ?Vasopneumatic: 10 minutes, medium compression, 34 degrees ? ? ? ? ? ?07/08/2021 ?SciFit seat 9 level 1 with BUEs & BLEs for 8 min ?Seated LAQ 3# and AROM flexion with contralateral LE opposite motion 15 reps 2 sets. ?Step-up & step-down 6 inch step BUE support 10 reps.  ?Leg Press BLEs 87# 15 reps 2 sets 37# 15 reps 1 set ?Hamstring stretch SLR w/strap DF 30 sec 2 reps ?Prone knee flexion then hip ext 10 reps ?Stairs alternating pattern with 2 rails 11 steps 1 rep with PT demo & verbal cues.  ? ?Neuromusclar re-education: ?Tandem balance on foam beam 30 seconds 2 reps ea RLE in front & in back; requires intermittent touch.  ?Stepping towards 3 target cones (ant-lat, lat & post-lat) reps ea LE lead.  ?  ?PT recommended sitting in car in park & try to move RLE gas to/from brake.  Pt verbalized understanding.  ?Manual Therapy:  ?Vasopneumatic right knee medium compression 34* 10 min. ? ?07/03/2021 ?SciFit seat 9 level 1 with BUEs & BLEs for 8 min ?Seated LAQ 2# and AROM flexion with contralateral LE opposite motion 15 reps 2 sets. ?Step-up & step-down 4 inch step BUE support 10 reps.  ? ?Neuromusclar re-education: ?Tandem balance on foam beam 30 seconds 2 reps ea RLE in front & in back; requires intermittent touch.  ?  ?Manual Therapy: PROM with end range stretch for knee flexion & extension ?Vasopneumatic right knee medium compression 34* 10 min. ? ?07/02/2021 ?Nustep level 5 seat 8 with BUEs & BLEs for 8 min ?Seated straight leg raises 3 sets of 5 ? ?Functional Activities:  ?Leg Press B full extension to stretch  into flexion 15X @ 75# ?R LE only 37# 10X same parameters (full extension and stretch into flexion) ?Step-up and over 4 inch step slow eccentrics ? ?Neuromusclar re-education: ?Tandem balance 6X 20 seconds ? ?Vasopneumatic right knee medium compression 34* 10 min. ? ? ?  ?PATIENT EDUCATION:  ?Education details: PT POC, HEP ?Person educated: Patient ?Education method: Explanation, Demonstration, Tactile cues, Verbal cues, and Handouts ?Education comprehension: verbalized understanding and returned demonstration ?  ?  ?  HOME EXERCISE PROGRAM: ?Access Code: [email or web address] ?URL: [email or web address] ?Date: 06/24/2021 ?Prepared by: Kearney Hard ?  ?Exercises ?- Seated Long Arc Quad  - 3 x daily - 7 x weekly - 2 sets - 10 reps ?- Long Sitting Quad Set with Towel Roll Under Heel  - 3 x daily - 7 x weekly - 2 sets - 10 reps - 5-10 seconds hold ?- Supine Heel Slide with Strap  - 3 x daily - 7 x weekly - 2 sets - 10 reps - 5 seconds hold ?- Sit to Stand with Counter Support  - 3 x daily - 7 x weekly - 2 sets - 10 reps ?  ?ASSESSMENT: ?  ?CLINICAL IMPRESSION: ?Pt able to tolerate exercises well. Pt still with joint line pain noted upon arrival. Pt was instructed to begin home soft tissue mobs to help with scar mobilization as well as continue her HEP. Pt's AROM arc was 2-104 degrees. Treatment focusing on tolerated improved ROM, strengthening and balance. Continue skilled PT to maximize pt's function.  ? ? ? ?Patient was able to tolerate increased range. PT worked on stairs alternating pattern but required 2 rails.  Pt continues to benefit from skilled PT.  ?  ?OBJECTIVE IMPAIRMENTS Abnormal gait, decreased activity tolerance, decreased balance, decreased mobility, difficulty walking, decreased ROM, decreased strength, increased edema, impaired flexibility, postural dysfunction, and pain.  ?  ?ACTIVITY LIMITATIONS cleaning and community activity.  ?  ?PERSONAL FACTORS 3+ comorbidities: DM, hypothyroidism, HA  are  also affecting patient's functional outcome.  ?  ?REHAB POTENTIAL: Good ?  ?CLINICAL DECISION MAKING: Stable/uncomplicated ?  ?EVALUATION COMPLEXITY: Low ?  ?  ?GOALS: ?Goals reviewed with patient? Yes ?Short term

## 2021-07-12 ENCOUNTER — Ambulatory Visit (INDEPENDENT_AMBULATORY_CARE_PROVIDER_SITE_OTHER): Payer: Medicare Other | Admitting: Physical Therapy

## 2021-07-12 ENCOUNTER — Encounter: Payer: Self-pay | Admitting: Physical Therapy

## 2021-07-12 DIAGNOSIS — M6281 Muscle weakness (generalized): Secondary | ICD-10-CM

## 2021-07-12 DIAGNOSIS — M25561 Pain in right knee: Secondary | ICD-10-CM | POA: Diagnosis not present

## 2021-07-12 DIAGNOSIS — R2689 Other abnormalities of gait and mobility: Secondary | ICD-10-CM

## 2021-07-12 DIAGNOSIS — G8929 Other chronic pain: Secondary | ICD-10-CM

## 2021-07-12 DIAGNOSIS — M25661 Stiffness of right knee, not elsewhere classified: Secondary | ICD-10-CM | POA: Diagnosis not present

## 2021-07-12 DIAGNOSIS — R600 Localized edema: Secondary | ICD-10-CM

## 2021-07-12 NOTE — Therapy (Signed)
?OUTPATIENT PHYSICAL THERAPY TREATMENT NOTE ? ? ?Patient Name: Krystal Lang ?MRN: [id number] ?DOB:09-18-1955, 66 y.o., female ?Today's Date: 07/12/2021 ? ?PCP: Dixie Dials, MD ?REFERRING PROVIDER: Lynnell Jude MD ? ? PT End of Session - 07/12/21 1034   ? ? Visit Number 9   ? Number of Visits 24   ? Date for PT Re-Evaluation 08/23/21   ? Authorization Type Medicare, KX modifier starting at visit 11 (pt had 4 pre-op visits)   ? Progress Note Due on Visit 10   ? PT Start Time 1020   ? PT Stop Time 1105   ? PT Time Calculation (min) 45 min   ? Activity Tolerance Patient tolerated treatment well   ? Behavior During Therapy [facility] for tasks assessed/performed   ? ?  ?  ? ?  ? ? ? ? ? ? ? ?Past Medical History:  ?Diagnosis Date  ? Cataract   ? Mixed form OU  ? Diabetes mellitus   ? Fibroid uterus 2001  ? H/O fatigue 1998  ? Headache(784.0)   ? Hirsutism 1998  ? Idiopathic  ? History of chicken pox   ? Hypothyroidism   ? Menses, irregular 1998  ? ?Past Surgical History:  ?Procedure Laterality Date  ? DILATION AND CURETTAGE OF UTERUS  2002  ? In [place]  ? KNEE SURGERY    ? TONSILECTOMY, ADENOIDECTOMY, BILATERAL MYRINGOTOMY AND TUBES    ? TONSILLECTOMY    ? ?Patient Active Problem List  ? Diagnosis Date Noted  ? Bilateral primary osteoarthritis of knee 05/11/2017  ? Dyspareunia, female 11/13/2011  ? Type 2 diabetes mellitus ([place]) 11/13/2011  ? Alopecia 11/13/2011  ? History of chicken pox   ? Headache(784.0)   ? Hypothyroidism   ? Menses, irregular   ? Hirsutism   ? H/O fatigue   ? Fibroid uterus   ? ? ?REFERRING DIAG: Z96.659 (ICD-10-CM) - S/P total knee arthroplasty 06/05/2021 ? ?THERAPY DIAG:  ?Stiffness of right knee, not elsewhere classified ? ?Chronic pain of right knee ? ?Other abnormalities of gait and mobility ? ?Muscle weakness (generalized) ? ?Localized edema ? ?PERTINENT HISTORY: RT TKA on 06/05/2021 ? ?PRECAUTIONS: None ? ?SUBJECTIVE: Pt arriving today reporting 2-4/10 pain today in her left  knee. ? ? ?PAIN:  ?Are you having pain?  Yes: NPRS scale: arrived 3-4/10 and Ranging 2/10 to 7/10 ?Pain location: knee anterior ?Pain description: intermittent heavy, tightness, sometimes sharp ?Aggravating factors: change position sleeping  ?Relieving factors: ice, meds Rx ? ?OBJECTIVE:  ?  ?DIAGNOSTIC FINDINGS: prior to surgery: significant tricompartmental arthritis predominantly in the medial compartment where there are areas of complete cartilage loss.  There is also a radial tear of the medial meniscus posteriorly. ?  ?PATIENT SURVEYS:  ?06/24/2021: FOTO 47% (predicted 62%) ?  ?POSTURE:  Rounded shoulders ?  ?PALPATION: ?06/24/2021 TTP: medial joint line, pt reporting lateral numbness ?           ?EDEMA: Rt: 43 centimeters, Left 38.5 centimeters ?  ?LE ROM: ?  ?Active ROM Right ?06/24/21 Left ?06/24/21 Right ?06/28/21 Right ?07/01/21 Right ?07/03/21 Rt ?07/10/21 Rt ?07/12/21  ?Knee flexion 90 112 98 101* ?supine 103* ?supine 104  ?supine 109 PROM supine  ?Knee extension -5 -2 -2 -2* ?supine -3* ?Seated LAQ -2 ?supine -1 ?supine  ?(Blank rows = not tested) ?  ?Passive ROM Rt ?06/24/21 Lt ?06/24/21 Rt ?06/28/21 Right ?07/03/21  ?Knee flexion 95 115 104 115* ?seated  ?Knee extension -3 -2 -1 -1*  ?  ?  ?  LE MMT: ?  ?MMT Right ?06/24/21 Left ?06/24/21  ?Hip flexion 5/5 5/5  ?Hip extension      ?Hip abduction      ?Hip adduction 5/5 5/5  ?Hip internal rotation      ?Hip external rotation      ?Knee flexion 4-/5 5/5  ?Knee extension 3+/5 5/5  ?Ankle dorsiflexion      ?Ankle plantarflexion      ?Ankle inversion      ?Ankle eversion      ?(Blank rows = not tested) ?  ?FUNCTIONAL TESTS:  ?06/24/2021: 5 times sit to stand: 14 seconds with UE support with walker in front if needed.  ?  ?GAIT: ?06/24/2021  Distance walked: amb with rolling walker with mild antalgic gait with decreased terminal knee extension on Rt LE ?Assistive device utilized: Environmental consultant - 2 wheeled ?Level of assistance: Modified independence ?Comments: see above ? ?TODAY'S  TREATMENT: ?07/10/2021:  ?Recumbent bike L2 x 8 minutes, seat at 6  ?Slant board stretch x 2 gastroc stretch and X 2 soleus x 30 seconds ?Stairs 2 flights using bilat handrails to descend started step to pattern and progressed to reciprical patter, able to re ?TRX squats 2 x10  ?Rt knee PROM for flexion, flexion mobs, and hamstring stretching of Rt knee ? ?Modalities:  ?Vasopneumatic: 10 minutes, medium compression, 34 degrees ? ?07/10/2021:  ?Recumbent bike L2 x 8 minutes, seat at 6  ?Slant board stretch x 2 gastroc stretch x 30 seconds ?Step ups 6 inch step x 15 single UE support ?TRX squats 2 x10  ? ?Neuro-re-education:  ?Airex: standing feet apart, feet togheter, head turns, reaching upward, eyes closed all with CGA to close supervision  ?Airex beam x 6 with finger touch intermittent support ? ?Modalities:  ?Vasopneumatic: 10 minutes, medium compression, 34 degrees ? ? ?  ?PATIENT EDUCATION:  ?Education details: PT POC, HEP ?Person educated: Patient ?Education method: Explanation, Demonstration, Tactile cues, Verbal cues, and Handouts ?Education comprehension: verbalized understanding and returned demonstration ?  ?  ?HOME EXERCISE PROGRAM: ?Access Code: [email or web address] ?URL: [email or web address] ?Date: 06/24/2021 ?Prepared by: Kearney Hard ?  ?Exercises ?- Seated Long Arc Quad  - 3 x daily - 7 x weekly - 2 sets - 10 reps ?- Long Sitting Quad Set with Towel Roll Under Heel  - 3 x daily - 7 x weekly - 2 sets - 10 reps - 5-10 seconds hold ?- Supine Heel Slide with Strap  - 3 x daily - 7 x weekly - 2 sets - 10 reps - 5 seconds hold ?- Sit to Stand with Counter Support  - 3 x daily - 7 x weekly - 2 sets - 10 reps ?  ?ASSESSMENT: ?  ?CLINICAL IMPRESSION: ?Pt is progressing very well overall. She will benefit from more knee strengthening and more knee flexion ROM but this is coming along great so far.  ? ? ? ?Patient was able to tolerate increased range. PT worked on stairs alternating pattern but required 2  rails.  Pt continues to benefit from skilled PT.  ?  ?OBJECTIVE IMPAIRMENTS Abnormal gait, decreased activity tolerance, decreased balance, decreased mobility, difficulty walking, decreased ROM, decreased strength, increased edema, impaired flexibility, postural dysfunction, and pain.  ?  ?ACTIVITY LIMITATIONS cleaning and community activity.  ?  ?PERSONAL FACTORS 3+ comorbidities: DM, hypothyroidism, HA  are also affecting patient's functional outcome.  ?  ?REHAB POTENTIAL: Good ?  ?CLINICAL DECISION MAKING: Stable/uncomplicated ?  ?EVALUATION COMPLEXITY: Low ?  ?  ?GOALS: ?Goals  reviewed with patient? Yes ?Short term PT Goals (target date for Short term goals are 4 weeks 07/26/2021) ?Patient will demonstrate independent use of home exercise program to maintain progress from in clinic treatments. ?Goal status: New ?  ?Long term PT goals (target dates for all long term goals are 8 weeks  08/23/2021 ) ?Patient will demonstrate/report pain at worst less than or equal to 2/10 to facilitate minimal limitation in daily activity secondary to pain symptoms. ?Goal status: New ?  ?Patient will demonstrate independent use of home exercise program to facilitate ability to maintain/progress functional gains from skilled physical therapy services. ?Goal status: New ?  ?Patient will demonstrate FOTO outcome > or = 62 % to indicate reduced disability due to condition. ?Goal status: 47% on 06/24/2021 at eval ?  ?Pt will be able to amb with no device with normalized gait pattern on community surfaces.  ?Goal status: New ?  ?    5.  Pt will improve Rt knee flexion to >/= 120 degrees to improve functional mobility and gait.  ?Goal status: New ?  ?  ?PLAN: ?PT FREQUENCY: 3x/week ?  ?PT DURATION: 8 weeks ?  ?PLANNED INTERVENTIONS: Therapeutic exercises, Therapeutic activity, Neuro Muscular re-education, Balance training, Gait training, Patient/Family education, Joint mobilization, Stair training, DME instructions, Dry Needling, Electrical  stimulation, Cryotherapy, Moist heat, Taping, Ultrasound, Ionotophoresis '4mg'$ /ml Dexamethasone, and Manual therapy.  All included unless contraindicated ?  ?PLAN FOR NEXT SESSION:  needs progress note, continue stair tra

## 2021-07-15 ENCOUNTER — Encounter: Payer: Self-pay | Admitting: Physical Therapy

## 2021-07-15 ENCOUNTER — Ambulatory Visit (INDEPENDENT_AMBULATORY_CARE_PROVIDER_SITE_OTHER): Payer: Medicare Other | Admitting: Physical Therapy

## 2021-07-15 DIAGNOSIS — M6281 Muscle weakness (generalized): Secondary | ICD-10-CM | POA: Diagnosis not present

## 2021-07-15 DIAGNOSIS — R2689 Other abnormalities of gait and mobility: Secondary | ICD-10-CM

## 2021-07-15 DIAGNOSIS — M25661 Stiffness of right knee, not elsewhere classified: Secondary | ICD-10-CM

## 2021-07-15 DIAGNOSIS — R600 Localized edema: Secondary | ICD-10-CM

## 2021-07-15 DIAGNOSIS — M25561 Pain in right knee: Secondary | ICD-10-CM

## 2021-07-15 DIAGNOSIS — G8929 Other chronic pain: Secondary | ICD-10-CM

## 2021-07-15 NOTE — Therapy (Signed)
?OUTPATIENT PHYSICAL THERAPY TREATMENT NOTE ?PROGRESS NOTE ? ? ?Patient Name: Krystal Lang ?MRN: [id number] ?DOB:Jan 16, 1956, 66 y.o., female ?Today's Date: 07/15/2021 ? ?PCP: Dixie Dials, MD ?REFERRING PROVIDER: Lynnell Jude MD ? ? PT End of Session - 07/15/21 2549   ? ? Visit Number 10   ? Number of Visits 24   ? Date for PT Re-Evaluation 08/23/21   ? Authorization Type Medicare, KX modifier starting at visit 11 (pt had 4 pre-op visits)   ? Progress Note Due on Visit 20   ? PT Start Time 0930   ? PT Stop Time 1015   ? PT Time Calculation (min) 45 min   ? Activity Tolerance Patient tolerated treatment well   ? Behavior During Therapy [facility] for tasks assessed/performed   ? ?  ?  ? ?  ? ? ?Progress Note ?Reporting Period  from 06/24/2021 to .td ? ?See note below for Objective Data and Assessment of Progress/Goals.  ? ? ? ? ? ? ? ? ?Past Medical History:  ?Diagnosis Date  ? Cataract   ? Mixed form OU  ? Diabetes mellitus   ? Fibroid uterus 2001  ? H/O fatigue 1998  ? Headache(784.0)   ? Hirsutism 1998  ? Idiopathic  ? History of chicken pox   ? Hypothyroidism   ? Menses, irregular 1998  ? ?Past Surgical History:  ?Procedure Laterality Date  ? DILATION AND CURETTAGE OF UTERUS  2002  ? In [place]  ? KNEE SURGERY    ? TONSILECTOMY, ADENOIDECTOMY, BILATERAL MYRINGOTOMY AND TUBES    ? TONSILLECTOMY    ? ?Patient Active Problem List  ? Diagnosis Date Noted  ? Bilateral primary osteoarthritis of knee 05/11/2017  ? Dyspareunia, female 11/13/2011  ? Type 2 diabetes mellitus ([place]) 11/13/2011  ? Alopecia 11/13/2011  ? History of chicken pox   ? Headache(784.0)   ? Hypothyroidism   ? Menses, irregular   ? Hirsutism   ? H/O fatigue   ? Fibroid uterus   ? ? ?REFERRING DIAG: Z96.659 (ICD-10-CM) - S/P total knee arthroplasty 06/05/2021 ? ?THERAPY DIAG:  ?Stiffness of right knee, not elsewhere classified ? ?Chronic pain of right knee ? ?Other abnormalities of gait and mobility ? ?Muscle weakness (generalized) ? ?Localized  edema ? ?PERTINENT HISTORY: RT TKA on 06/05/2021 ? ?PRECAUTIONS: None ? ?SUBJECTIVE: Pt arriving today reporting 3/10 pain in her posterior Rt knee.  ? ? ?PAIN:  ?Are you having pain?  Yes: NPRS scale: arrived 3/10 and Ranging 2/10 to 7/10 ?Pain location: knee anterior ?Pain description: intermittent heavy, tightness, sometimes sharp ?Aggravating factors: change position sleeping  ?Relieving factors: ice, meds Rx ? ?OBJECTIVE:  ?  ?DIAGNOSTIC FINDINGS: prior to surgery: significant tricompartmental arthritis predominantly in the medial compartment where there are areas of complete cartilage loss.  There is also a radial tear of the medial meniscus posteriorly. ?  ?PATIENT SURVEYS:  ?06/24/2021: FOTO 47% (predicted 62%) ?  ?POSTURE:  Rounded shoulders ?  ?PALPATION: ?06/24/2021 TTP: medial joint line, pt reporting lateral numbness ?           ?EDEMA: Rt: 43 centimeters, Left 38.5 centimeters ?  ?LE ROM: ?  ?Active ROM Right ?06/24/21 Left ?06/24/21 Right ?06/28/21 Right ?07/01/21 Right ?07/03/21 Rt ?07/10/21 Rt ?07/12/21 Rt ?07/15/21  ?Knee flexion 90 112 98 101* ?supine 103* ?supine 104  ?supine 109 PROM supine 105  ?supine ?  ?Knee extension -5 -2 -2 -2* ?supine -3* ?Seated LAQ -2 ?supine -1 ?supine -1  ?(Blank  rows = not tested) ?  ?Passive ROM Rt ?06/24/21 Lt ?06/24/21 Rt ?06/28/21 Right ?07/03/21  ?Knee flexion 95 115 104 115* ?seated  ?Knee extension -3 -2 -1 -1*  ?  ?  ?LE MMT: ?  ?MMT Right ?06/24/21 Left ?06/24/21  ?Hip flexion 5/5 5/5  ?Hip extension      ?Hip abduction      ?Hip adduction 5/5 5/5  ?Hip internal rotation      ?Hip external rotation      ?Knee flexion 4-/5 5/5  ?Knee extension 3+/5 5/5  ?Ankle dorsiflexion      ?Ankle plantarflexion      ?Ankle inversion      ?Ankle eversion      ?(Blank rows = not tested) ?  ?FUNCTIONAL TESTS:  ?06/24/2021: 5 times sit to stand: 14 seconds with UE support with walker in front if needed.  ?  ?GAIT: ?06/24/2021  Distance walked: amb with rolling walker with mild antalgic gait with  decreased terminal knee extension on Rt LE ?Assistive device utilized: Environmental consultant - 2 wheeled ?Level of assistance: Modified independence ?Comments: see above ? ?TODAY'S TREATMENT: ?07/15/2021:  ?Recumbent bike L2 x 8 minutes, seat at 6  ?Slant board stretch x 2 gastroc stretch and X 2 soleus x 30 seconds ?Step downs on 4 inch step x 20 with single UE support ?TRX squats 2 x10  ?Fitter Industrial/product designer, med/lateral movement x 2 minutes with UE support ?SLS: level ground ?Side stepping x 20 feet  ?Hamstring stretch x 3 in supine using stretch strap ?SKTC to stretch out low back x 2 holding 20 seconds ? ?Rt knee PROM for flexion, flexion mobs, and hamstring stretching of Rt knee ? ?Modalities:  ?Vasopneumatic: 10 minutes, medium compression, 34 degrees ? ?07/10/2021:  ?Recumbent bike L2 x 8 minutes, seat at 6  ?Slant board stretch x 2 gastroc stretch and X 2 soleus x 30 seconds ?Stairs 2 flights using bilat handrails to descend started step to pattern and progressed to reciprical patter, able to re ?TRX squats 2 x10  ?Rt knee PROM for flexion, flexion mobs, and hamstring stretching of Rt knee ? ?Modalities:  ?Vasopneumatic: 10 minutes, medium compression, 34 degrees ? ?07/10/2021:  ?Recumbent bike L2 x 8 minutes, seat at 6  ?Slant board stretch x 2 gastroc stretch x 30 seconds ?Step ups 6 inch step x 15 single UE support ?TRX squats 2 x10  ? ?Neuro-re-education:  ?Airex: standing feet apart, feet togheter, head turns, reaching upward, eyes closed all with CGA to close supervision  ?Airex beam x 6 with finger touch intermittent support ? ?Modalities:  ?Vasopneumatic: 10 minutes, medium compression, 34 degrees ? ? ?  ?PATIENT EDUCATION:  ?Education details: PT POC, HEP ?Person educated: Patient ?Education method: Explanation, Demonstration, Tactile cues, Verbal cues, and Handouts ?Education comprehension: verbalized understanding and returned demonstration ?  ?  ?HOME EXERCISE PROGRAM: ?Access Code: [email or web address] ?URL:  [email or web address] ?Date: 06/24/2021 ?Prepared by: Kearney Hard ?  ?Exercises ?- Seated Long Arc Quad  - 3 x daily - 7 x weekly - 2 sets - 10 reps ?- Long Sitting Quad Set with Towel Roll Under Heel  - 3 x daily - 7 x weekly - 2 sets - 10 reps - 5-10 seconds hold ?- Supine Heel Slide with Strap  - 3 x daily - 7 x weekly - 2 sets - 10 reps - 5 seconds hold ?- Sit to Stand with Counter Support  - 3 x daily - 7 x weekly -  2 sets - 10 reps ?  ?ASSESSMENT: ?  ?CLINICAL IMPRESSION: ?Pt arriving with concerns about pain in the back of her knee and the appearance of her Rt knee. Pt reassured that the appearance of her incision looks normal. Pt still with limitations in left knee flexion, but improving in strength and functional mobility. Continue skilled PT to maximize function.  ? ? ?Patient was able to tolerate increased range. PT worked on stairs alternating pattern but required 2 rails.  Pt continues to benefit from skilled PT.  ?  ?OBJECTIVE IMPAIRMENTS Abnormal gait, decreased activity tolerance, decreased balance, decreased mobility, difficulty walking, decreased ROM, decreased strength, increased edema, impaired flexibility, postural dysfunction, and pain.  ?  ?ACTIVITY LIMITATIONS cleaning and community activity.  ?  ?PERSONAL FACTORS 3+ comorbidities: DM, hypothyroidism, HA  are also affecting patient's functional outcome.  ?  ?REHAB POTENTIAL: Good ?  ?CLINICAL DECISION MAKING: Stable/uncomplicated ?  ?EVALUATION COMPLEXITY: Low ?  ?  ?GOALS: ?Goals reviewed with patient? Yes ?Short term PT Goals (target date for Short term goals are 4 weeks 07/26/2021) ?Patient will demonstrate independent use of home exercise program to maintain progress from in clinic treatments. ?Goal status: New ?  ?Long term PT goals (target dates for all long term goals are 8 weeks  08/23/2021 ) ?Patient will demonstrate/report pain at worst less than or equal to 2/10 to facilitate minimal limitation in daily activity  secondary to pain symptoms. ?Goal status: on-going 07/15/2021 ?  ?Patient will demonstrate independent use of home exercise program to facilitate ability to maintain/progress functional gains from skilled physical therap

## 2021-07-17 ENCOUNTER — Encounter: Payer: Self-pay | Admitting: Physical Therapy

## 2021-07-17 ENCOUNTER — Ambulatory Visit (INDEPENDENT_AMBULATORY_CARE_PROVIDER_SITE_OTHER): Payer: Medicare Other | Admitting: Physical Therapy

## 2021-07-17 DIAGNOSIS — M25661 Stiffness of right knee, not elsewhere classified: Secondary | ICD-10-CM | POA: Diagnosis not present

## 2021-07-17 DIAGNOSIS — G8929 Other chronic pain: Secondary | ICD-10-CM

## 2021-07-17 DIAGNOSIS — R2689 Other abnormalities of gait and mobility: Secondary | ICD-10-CM | POA: Diagnosis not present

## 2021-07-17 DIAGNOSIS — R600 Localized edema: Secondary | ICD-10-CM

## 2021-07-17 DIAGNOSIS — M25561 Pain in right knee: Secondary | ICD-10-CM | POA: Diagnosis not present

## 2021-07-17 DIAGNOSIS — M6281 Muscle weakness (generalized): Secondary | ICD-10-CM | POA: Diagnosis not present

## 2021-07-17 NOTE — Therapy (Signed)
?OUTPATIENT PHYSICAL THERAPY TREATMENT NOTE ? ? ? ?Patient Name: Krystal Lang ?MRN: [id number] ?DOB:02-27-1956, 66 y.o., female ?Today's Date: 07/17/2021 ? ?PCP: Dixie Dials, MD ?REFERRING PROVIDER: Lynnell Jude MD ? ? PT End of Session - 07/17/21 1007   ? ? Visit Number 11   ? Number of Visits 24   ? Date for PT Re-Evaluation 08/23/21   ? Authorization Type Medicare, KX modifier starting at visit 11 (pt had 4 pre-op visits)   ? Progress Note Due on Visit 20   ? PT Start Time 0930   ? PT Stop Time 1015   ? PT Time Calculation (min) 45 min   ? Activity Tolerance Patient tolerated treatment well   ? Behavior During Therapy [facility] for tasks assessed/performed   ? ?  ?  ? ?  ? ? ? ? ? ? ? ? ? ? ? ? ?Past Medical History:  ?Diagnosis Date  ? Cataract   ? Mixed form OU  ? Diabetes mellitus   ? Fibroid uterus 2001  ? H/O fatigue 1998  ? Headache(784.0)   ? Hirsutism 1998  ? Idiopathic  ? History of chicken pox   ? Hypothyroidism   ? Menses, irregular 1998  ? ?Past Surgical History:  ?Procedure Laterality Date  ? DILATION AND CURETTAGE OF UTERUS  2002  ? In [place]  ? KNEE SURGERY    ? TONSILECTOMY, ADENOIDECTOMY, BILATERAL MYRINGOTOMY AND TUBES    ? TONSILLECTOMY    ? ?Patient Active Problem List  ? Diagnosis Date Noted  ? Bilateral primary osteoarthritis of knee 05/11/2017  ? Dyspareunia, female 11/13/2011  ? Type 2 diabetes mellitus ([place]) 11/13/2011  ? Alopecia 11/13/2011  ? History of chicken pox   ? Headache(784.0)   ? Hypothyroidism   ? Menses, irregular   ? Hirsutism   ? H/O fatigue   ? Fibroid uterus   ? ? ?REFERRING DIAG: Z96.659 (ICD-10-CM) - S/P total knee arthroplasty 06/05/2021 ? ?THERAPY DIAG:  ?Stiffness of right knee, not elsewhere classified ? ?Chronic pain of right knee ? ?Other abnormalities of gait and mobility ? ?Muscle weakness (generalized) ? ?Localized edema ? ?PERTINENT HISTORY: RT TKA on 06/05/2021 ? ?PRECAUTIONS: None ? ?SUBJECTIVE: Pt reporting difficulty sleeping in which she is having to  take Bendryl to help sleep. Pt reporting 5/10 pain in Rt knee.  ? ? ?PAIN:  ?Are you having pain?  Yes: NPRS scale: arrived 5/10 and Ranging 2/10 to 7/10 ?Pain location: knee anterior ?Pain description: intermittent heavy, tightness, sometimes sharp ?Aggravating factors: change position sleeping  ?Relieving factors: ice, meds Rx ? ?OBJECTIVE:  ?  ?DIAGNOSTIC FINDINGS: prior to surgery: significant tricompartmental arthritis predominantly in the medial compartment where there are areas of complete cartilage loss.  There is also a radial tear of the medial meniscus posteriorly. ?  ?PATIENT SURVEYS:  ?06/24/2021: FOTO 47% (predicted 62%) ?  ?POSTURE:  Rounded shoulders ?  ?PALPATION: ?06/24/2021 TTP: medial joint line, pt reporting lateral numbness ?           ?EDEMA: Rt: 43 centimeters, Left 38.5 centimeters ?  ?LE ROM: ?  ?Active ROM Right ?06/24/21 Left ?06/24/21 Right ?06/28/21 Right ?07/01/21 Right ?07/03/21 Rt ?07/10/21 Rt ?07/12/21 Rt ?07/15/21 Rt ?07/17/21  ?Knee flexion 90 112 98 101* ?supine 103* ?supine 104  ?supine 109 PROM supine 105  ?supine ? 108 ?supine  ?Knee extension -5 -2 -2 -2* ?supine -3* ?Seated LAQ -2 ?supine -1 ?supine -1 -2  ?(Blank rows = not tested) ?  ?  Passive ROM Rt ?06/24/21 Lt ?06/24/21 Rt ?06/28/21 Right ?07/03/21 Rt ?07/17/21  ?Knee flexion 95 115 104 115* ?seated 115 ?supine  ?Knee extension -3 -2 -1 -1*   ?  ?  ?LE MMT: ?  ?MMT Right ?06/24/21 Left ?06/24/21  ?Hip flexion 5/5 5/5  ?Hip extension      ?Hip abduction      ?Hip adduction 5/5 5/5  ?Hip internal rotation      ?Hip external rotation      ?Knee flexion 4-/5 5/5  ?Knee extension 3+/5 5/5  ?Ankle dorsiflexion      ?Ankle plantarflexion      ?Ankle inversion      ?Ankle eversion      ?(Blank rows = not tested) ?  ?FUNCTIONAL TESTS:  ?06/24/2021: 5 times sit to stand: 14 seconds with UE support with walker in front if needed.  ?  ?GAIT: ?06/24/2021  Distance walked: amb with rolling walker with mild antalgic gait with decreased terminal knee  extension on Rt LE ?Assistive device utilized: Environmental consultant - 2 wheeled ?Level of assistance: Modified independence ?Comments: see above ? ?TODAY'S TREATMENT: ?07/17/2021:  ?Recumbent bike L3 x 10 minutes, seat at 6  ?gastroc stretch and X 2  holding 30 seconds using lunge position and towel roll ?Hamstring stretch seated using strap x 3 holding 30 seconds ?Hamstring stretch x 2 holding 20 seconds in supine using strap ?IT band stretch x 2 holding 30 seconds using strap in supine  ?Step downs on 4 inch step x 20 with single UE support ?TRX squats 2 x10  ?Fitter Industrial/product designer, med/lateral and front/back movement x 2 minutes with UE support ?SLS: level ground ? ?Contract/relax in sitting with contralateral LE performing opposite motion ? ?Modalities:  ?Vasopneumatic: 10 minutes, medium compression, 34 degrees ? ?07/15/2021:  ?Recumbent bike L2 x 8 minutes, seat at 6  ?Slant board stretch x 2 gastroc stretch and X 2 soleus x 30 seconds ?Step downs on 4 inch step x 20 with single UE support ?TRX squats 2 x10  ?Fitter Industrial/product designer, med/lateral movement x 2 minutes with UE support ?SLS: level ground ?Side stepping x 20 feet  ?Hamstring stretch x 3 in supine using stretch strap ?SKTC to stretch out low back x 2 holding 20 seconds ? ?Rt knee PROM for flexion, flexion mobs, and hamstring stretching of Rt knee ? ?Modalities:  ?Vasopneumatic: 10 minutes, medium compression, 34 degrees ? ?07/10/2021:  ?Recumbent bike L2 x 8 minutes, seat at 6  ?Slant board stretch x 2 gastroc stretch and X 2 soleus x 30 seconds ?Stairs 2 flights using bilat handrails to descend started step to pattern and progressed to reciprical patter, able to re ?TRX squats 2 x10  ?Rt knee PROM for flexion, flexion mobs, and hamstring stretching of Rt knee ? ?Modalities:  ?Vasopneumatic: 10 minutes, medium compression, 34 degrees ? ?07/10/2021:  ?Recumbent bike L2 x 8 minutes, seat at 6  ?Slant board stretch x 2 gastroc stretch x 30 seconds ?Step ups 6 inch  step x 15 single UE support ?TRX squats 2 x10  ? ?Neuro-re-education:  ?Airex: standing feet apart, feet togheter, head turns, reaching upward, eyes closed all with CGA to close supervision  ?Airex beam x 6 with finger touch intermittent support ? ?Modalities:  ?Vasopneumatic: 10 minutes, medium compression, 34 degrees ? ? ?  ?PATIENT EDUCATION:  ?Education details: PT POC, HEP ?Person educated: Patient ?Education method: Explanation, Demonstration, Tactile cues, Verbal cues, and Handouts ?Education comprehension: verbalized  understanding and returned demonstration ?  ?  ?HOME EXERCISE PROGRAM: ?Access Code: [email or web address] ?URL: [email or web address] ?Date: 06/24/2021 ?Prepared by: Kearney Hard ?  ?Exercises ?- Seated Long Arc Quad  - 3 x daily - 7 x weekly - 2 sets - 10 reps ?- Long Sitting Quad Set with Towel Roll Under Heel  - 3 x daily - 7 x weekly - 2 sets - 10 reps - 5-10 seconds hold ?- Supine Heel Slide with Strap  - 3 x daily - 7 x weekly - 2 sets - 10 reps - 5 seconds hold ?- Sit to Stand with Counter Support  - 3 x daily - 7 x weekly - 2 sets - 10 reps ?  ?ASSESSMENT: ?  ?CLINICAL IMPRESSION: ?Pt arriving reporting 5/10 pain in her right knee. Pt also reporting mild pain in her left medial joint line of her left knee. Pt reporting tightness in her hamstrings on the Rt. Pt tolerating exercises well with focusing on overall strengthening and ROM. Continue skilled PT to maximize function.  ? ? ?Patient was able to tolerate increased range. PT worked on stairs alternating pattern but required 2 rails.  Pt continues to benefit from skilled PT.  ?  ?OBJECTIVE IMPAIRMENTS Abnormal gait, decreased activity tolerance, decreased balance, decreased mobility, difficulty walking, decreased ROM, decreased strength, increased edema, impaired flexibility, postural dysfunction, and pain.  ?  ?ACTIVITY LIMITATIONS cleaning and community activity.  ?  ?PERSONAL FACTORS 3+ comorbidities: DM, hypothyroidism, HA   are also affecting patient's functional outcome.  ?  ?REHAB POTENTIAL: Good ?  ?CLINICAL DECISION MAKING: Stable/uncomplicated ?  ?EVALUATION COMPLEXITY: Low ?  ?  ?GOALS: ?Goals reviewed with patient? Yes ?Sho

## 2021-07-19 ENCOUNTER — Ambulatory Visit (INDEPENDENT_AMBULATORY_CARE_PROVIDER_SITE_OTHER): Payer: Medicare Other | Admitting: Physical Therapy

## 2021-07-19 ENCOUNTER — Encounter: Payer: Self-pay | Admitting: Physical Therapy

## 2021-07-19 DIAGNOSIS — R2689 Other abnormalities of gait and mobility: Secondary | ICD-10-CM | POA: Diagnosis not present

## 2021-07-19 DIAGNOSIS — G8929 Other chronic pain: Secondary | ICD-10-CM

## 2021-07-19 DIAGNOSIS — M25661 Stiffness of right knee, not elsewhere classified: Secondary | ICD-10-CM | POA: Diagnosis not present

## 2021-07-19 DIAGNOSIS — M25561 Pain in right knee: Secondary | ICD-10-CM

## 2021-07-19 DIAGNOSIS — M6281 Muscle weakness (generalized): Secondary | ICD-10-CM | POA: Diagnosis not present

## 2021-07-19 DIAGNOSIS — R600 Localized edema: Secondary | ICD-10-CM

## 2021-07-19 NOTE — Therapy (Signed)
?OUTPATIENT PHYSICAL THERAPY TREATMENT NOTE ? ? ? ?Patient Name: Krystal Lang ?MRN: [id number] ?DOB:01-19-1956, 66 y.o., female ?Today's Date: 07/19/2021 ? ?PCP: Dixie Dials, MD ?REFERRING PROVIDER: Lynnell Jude MD ? ? PT End of Session - 07/19/21 0931   ? ? Visit Number 12   ? Number of Visits 24   ? Date for PT Re-Evaluation 08/23/21   ? Authorization Type Medicare, KX modifier starting at visit 11 (pt had 4 pre-op visits)   ? Progress Note Due on Visit 20   ? PT Start Time [phone]   ? PT Stop Time 1010   ? PT Time Calculation (min) 45 min   ? Activity Tolerance Patient tolerated treatment well   ? Behavior During Therapy [facility] for tasks assessed/performed   ? ?  ?  ? ?  ? ? ? ? ? ? ? ? ? ? ? ? ?Past Medical History:  ?Diagnosis Date  ? Cataract   ? Mixed form OU  ? Diabetes mellitus   ? Fibroid uterus 2001  ? H/O fatigue 1998  ? Headache(784.0)   ? Hirsutism 1998  ? Idiopathic  ? History of chicken pox   ? Hypothyroidism   ? Menses, irregular 1998  ? ?Past Surgical History:  ?Procedure Laterality Date  ? DILATION AND CURETTAGE OF UTERUS  2002  ? In [place]  ? KNEE SURGERY    ? TONSILECTOMY, ADENOIDECTOMY, BILATERAL MYRINGOTOMY AND TUBES    ? TONSILLECTOMY    ? ?Patient Active Problem List  ? Diagnosis Date Noted  ? Bilateral primary osteoarthritis of knee 05/11/2017  ? Dyspareunia, female 11/13/2011  ? Type 2 diabetes mellitus ([place]) 11/13/2011  ? Alopecia 11/13/2011  ? History of chicken pox   ? Headache(784.0)   ? Hypothyroidism   ? Menses, irregular   ? Hirsutism   ? H/O fatigue   ? Fibroid uterus   ? ? ?REFERRING DIAG: Z96.659 (ICD-10-CM) - S/P total knee arthroplasty 06/05/2021 ? ?THERAPY DIAG:  ?Stiffness of right knee, not elsewhere classified ? ?Chronic pain of right knee ? ?Other abnormalities of gait and mobility ? ?Muscle weakness (generalized) ? ?Localized edema ? ?PERTINENT HISTORY: RT TKA on 06/05/2021 ? ?PRECAUTIONS: None ? ?SUBJECTIVE: Pt reporting  ? ?PAIN:  ?Are you having pain?  Yes: NPRS  scale: arrived 5/10 and Ranging 2/10 to 7/10 ?Pain location: knee anterior ?Pain description: intermittent heavy, tightness, sometimes sharp ?Aggravating factors: change position sleeping  ?Relieving factors: ice, meds Rx ? ?OBJECTIVE:  ?  ?DIAGNOSTIC FINDINGS: prior to surgery: significant tricompartmental arthritis predominantly in the medial compartment where there are areas of complete cartilage loss.  There is also a radial tear of the medial meniscus posteriorly. ?  ?PATIENT SURVEYS:  ?06/24/2021: FOTO 47% (predicted 62%) ?  ?POSTURE:  Rounded shoulders ?  ?PALPATION: ?06/24/2021 TTP: medial joint line, pt reporting lateral numbness ?           ?EDEMA: Rt: 43 centimeters, Left 38.5 centimeters ?  ?LE ROM: ?  ?Active ROM Right ?06/24/21 Left ?06/24/21 Right ?06/28/21 Right ?07/01/21 Right ?07/03/21 Rt ?07/10/21 Rt ?07/12/21 Rt ?07/15/21 Rt ?07/17/21  ?Knee flexion 90 112 98 101* ?supine 103* ?supine 104  ?supine 109 PROM supine 105  ?supine ? 108 ?supine  ?Knee extension -5 -2 -2 -2* ?supine -3* ?Seated LAQ -2 ?supine -1 ?supine -1 -2  ?(Blank rows = not tested) ?  ?Passive ROM Rt ?06/24/21 Lt ?06/24/21 Rt ?06/28/21 Right ?07/03/21 Rt ?07/17/21  ?Knee flexion 95 115 104 115* ?  seated 115 ?supine  ?Knee extension -3 -2 -1 -1*   ?  ?  ?LE MMT: ?  ?MMT Right ?06/24/21 Left ?06/24/21  ?Hip flexion 5/5 5/5  ?Hip extension      ?Hip abduction      ?Hip adduction 5/5 5/5  ?Hip internal rotation      ?Hip external rotation      ?Knee flexion 4-/5 5/5  ?Knee extension 3+/5 5/5  ?Ankle dorsiflexion      ?Ankle plantarflexion      ?Ankle inversion      ?Ankle eversion      ?(Blank rows = not tested) ?  ?FUNCTIONAL TESTS:  ?06/24/2021: 5 times sit to stand: 14 seconds with UE support with walker in front if needed.  ?  ?GAIT: ?06/24/2021  Distance walked: amb with rolling walker with mild antalgic gait with decreased terminal knee extension on Rt LE ?Assistive device utilized: Environmental consultant - 2 wheeled ?Level of assistance: Modified  independence ?Comments: see above ? ?TODAY'S TREATMENT: ?07/19/2021:  ?Recumbent bike L3 x 10 minutes, seat at 6  ?gastroc stretch 30 sec X 3 on  slantboard ?SL leg press on Rt 31# 3X10 ?Step ups with Rt leg and down with left leg on 6inch step x 15 with bilat UE support ?Sit to stands no UE support slow eccentrics 2X10 left leg slightly in front for more weight shift onto Rt ?Seated LAQ Rt leg 5# 3X10 ?Manual therapy for Rt knee PROM, flexion mobs, manual hamstring and gastroc stretching to tolerance ?Vasopneumatic: Rt knee 10 minutes, medium compression, 34 degrees ? ?07/17/2021:  ?Recumbent bike L3 x 10 minutes, seat at 6  ?gastroc stretch and X 2  holding 30 seconds using lunge position and towel roll ?Hamstring stretch seated using strap x 3 holding 30 seconds ?Hamstring stretch x 2 holding 20 seconds in supine using strap ?IT band stretch x 2 holding 30 seconds using strap in supine  ?Step downs on 4 inch step x 20 with single UE support ?TRX squats 2 x10  ?Fitter Industrial/product designer, med/lateral and front/back movement x 2 minutes with UE support ?SLS: level ground ? ?Contract/relax in sitting with contralateral LE performing opposite motion ? ?Modalities:  ?Vasopneumatic: 10 minutes, medium compression, 34 degrees ? ? ?  ?PATIENT EDUCATION:  ?Education details: PT POC, HEP ?Person educated: Patient ?Education method: Explanation, Demonstration, Tactile cues, Verbal cues, and Handouts ?Education comprehension: verbalized understanding and returned demonstration ?  ?  ?HOME EXERCISE PROGRAM: ?Access Code: [email or web address] ?URL: [email or web address] ?Date: 06/24/2021 ?Prepared by: Kearney Hard ?  ?Exercises ?- Seated Long Arc Quad  - 3 x daily - 7 x weekly - 2 sets - 10 reps ?- Long Sitting Quad Set with Towel Roll Under Heel  - 3 x daily - 7 x weekly - 2 sets - 10 reps - 5-10 seconds hold ?- Supine Heel Slide with Strap  - 3 x daily - 7 x weekly - 2 sets - 10 reps - 5 seconds hold ?- Sit to Stand with  Counter Support  - 3 x daily - 7 x weekly - 2 sets - 10 reps ?  ?ASSESSMENT: ?  ?CLINICAL IMPRESSION: ?She does still have some complaints of Rt knee pain and swelling however overall I feel she is progressing very well and nothing appears abnormal to me. She will continue to benefit from further ROM and strengthening of Rt knee. ? ?  ?OBJECTIVE IMPAIRMENTS Abnormal gait, decreased activity tolerance, decreased balance, decreased mobility, difficulty walking,  decreased ROM, decreased strength, increased edema, impaired flexibility, postural dysfunction, and pain.  ?  ?ACTIVITY LIMITATIONS cleaning and community activity.  ?  ?PERSONAL FACTORS 3+ comorbidities: DM, hypothyroidism, HA  are also affecting patient's functional outcome.  ?  ?REHAB POTENTIAL: Good ?  ?CLINICAL DECISION MAKING: Stable/uncomplicated ?  ?EVALUATION COMPLEXITY: Low ?  ?  ?GOALS: ?Goals reviewed with patient? Yes ?Short term PT Goals (target date for Short term goals are 4 weeks 07/26/2021) ?Patient will demonstrate independent use of home exercise program to maintain progress from in clinic treatments. ?Goal status: New ?  ?Long term PT goals (target dates for all long term goals are 8 weeks  08/23/2021 ) ?Patient will demonstrate/report pain at worst less than or equal to 2/10 to facilitate minimal limitation in daily activity secondary to pain symptoms. ?Goal status: on-going 07/17/2021 ?  ?Patient will demonstrate independent use of home exercise program to facilitate ability to maintain/progress functional gains from skilled physical therapy services. ?Goal status: on-going 07/17/2021 ?  ?Patient will demonstrate FOTO outcome > or = 62 % to indicate reduced disability due to condition. ?Goal status: 47% on 06/24/2021 at eval ? ?MET on 07/15/2021: 62% ? ?Pt will be able to amb with no device with normalized gait pattern on community surfaces.  ?Goal status: On-going 07/17/2021 ?  ?    5.  Pt will improve Rt knee flexion to >/= 120 degrees to  improve functional mobility and gait.  ?Goal status: ongoing 07/17/2021 ?  ?  ?PLAN: ?PT FREQUENCY: 3x/week ?  ?PT DURATION: 8 weeks ?  ?PLANNED INTERVENTIONS: Therapeutic exercises, Therapeutic activity, Neuro Muscular re-educat

## 2021-07-22 ENCOUNTER — Encounter: Payer: Medicare Other | Admitting: Physical Therapy

## 2021-07-24 ENCOUNTER — Encounter: Payer: Self-pay | Admitting: Physical Therapy

## 2021-07-24 ENCOUNTER — Ambulatory Visit (INDEPENDENT_AMBULATORY_CARE_PROVIDER_SITE_OTHER): Payer: Medicare Other | Admitting: Physical Therapy

## 2021-07-24 DIAGNOSIS — M25661 Stiffness of right knee, not elsewhere classified: Secondary | ICD-10-CM

## 2021-07-24 DIAGNOSIS — G8929 Other chronic pain: Secondary | ICD-10-CM

## 2021-07-24 DIAGNOSIS — R600 Localized edema: Secondary | ICD-10-CM

## 2021-07-24 DIAGNOSIS — R2689 Other abnormalities of gait and mobility: Secondary | ICD-10-CM | POA: Diagnosis not present

## 2021-07-24 DIAGNOSIS — M6281 Muscle weakness (generalized): Secondary | ICD-10-CM

## 2021-07-24 DIAGNOSIS — M25561 Pain in right knee: Secondary | ICD-10-CM | POA: Diagnosis not present

## 2021-07-24 NOTE — Therapy (Signed)
?OUTPATIENT PHYSICAL THERAPY TREATMENT NOTE ? ? ? ?Patient Name: Krystal Lang ?MRN: [id number] ?DOB:1955-04-21, 66 y.o., female ?Today's Date: 07/24/2021 ? ?PCP: Dixie Dials, MD ?REFERRING PROVIDER: Lynnell Jude MD ? ? PT End of Session - 07/24/21 5465   ? ? Visit Number 13   ? Number of Visits 24   ? Date for PT Re-Evaluation 08/23/21   ? Authorization Type Medicare, KX modifier starting at visit 11 (pt had 4 pre-op visits)   ? Progress Note Due on Visit 20   ? PT Start Time [phone]   ? PT Stop Time 1028   ? PT Time Calculation (min) 50 min   ? Activity Tolerance Patient tolerated treatment well   ? Behavior During Therapy [facility] for tasks assessed/performed   ? ?  ?  ? ?  ? ? ? ? ? ? ? ? ? ? ? ? ? ?Past Medical History:  ?Diagnosis Date  ? Cataract   ? Mixed form OU  ? Diabetes mellitus   ? Fibroid uterus 2001  ? H/O fatigue 1998  ? Headache(784.0)   ? Hirsutism 1998  ? Idiopathic  ? History of chicken pox   ? Hypothyroidism   ? Menses, irregular 1998  ? ?Past Surgical History:  ?Procedure Laterality Date  ? DILATION AND CURETTAGE OF UTERUS  2002  ? In [place]  ? KNEE SURGERY    ? TONSILECTOMY, ADENOIDECTOMY, BILATERAL MYRINGOTOMY AND TUBES    ? TONSILLECTOMY    ? ?Patient Active Problem List  ? Diagnosis Date Noted  ? Bilateral primary osteoarthritis of knee 05/11/2017  ? Dyspareunia, female 11/13/2011  ? Type 2 diabetes mellitus ([place]) 11/13/2011  ? Alopecia 11/13/2011  ? History of chicken pox   ? Headache(784.0)   ? Hypothyroidism   ? Menses, irregular   ? Hirsutism   ? H/O fatigue   ? Fibroid uterus   ? ? ?REFERRING DIAG: Z96.659 (ICD-10-CM) - S/P total knee arthroplasty 06/05/2021 ? ?THERAPY DIAG:  ?Stiffness of right knee, not elsewhere classified ? ?Chronic pain of right knee ? ?Other abnormalities of gait and mobility ? ?Localized edema ? ?Muscle weakness (generalized) ? ?PERTINENT HISTORY: RT TKA on 06/05/2021 ? ?PRECAUTIONS: None ? ?SUBJECTIVE: Pt reporting pain of 5/10 on her posterior Rt knee.   ? ?PAIN:  ?Are you having pain?  Yes: NPRS scale: arrived 5/10 and Ranging 2/10 to 7/10 ?Pain location: knee anterior ?Pain description: intermittent heavy, tightness, sometimes sharp ?Aggravating factors: change position sleeping  ?Relieving factors: ice, meds Rx ? ?OBJECTIVE:  ?  ?DIAGNOSTIC FINDINGS: prior to surgery: significant tricompartmental arthritis predominantly in the medial compartment where there are areas of complete cartilage loss.  There is also a radial tear of the medial meniscus posteriorly. ?  ?PATIENT SURVEYS:  ?06/24/2021: FOTO 47% (predicted 62%) ?  ?POSTURE:  Rounded shoulders ?  ?PALPATION: ?06/24/2021 TTP: medial joint line, pt reporting lateral numbness ?           ?EDEMA: Rt: 43 centimeters, Left 38.5 centimeters ?  ?LE ROM: ?  ?Active ROM Right ?06/24/21 Left ?06/24/21 Right ?06/28/21 Right ?07/01/21 Right ?07/03/21 Rt ?07/10/21 Rt ?07/12/21 Rt ?07/15/21 Rt ?07/17/21 Rt ?07/24/21  ?Knee flexion 90 112 98 101* ?supine 103* ?supine 104  ?supine 109 PROM supine 105  ?supine ? 108 ?supine 108 ?supine  ?Knee extension -5 -2 -2 -2* ?supine -3* ?Seated LAQ -2 ?supine -1 ?supine -1 -2 -2  ?(Blank rows = not tested) ?  ?Passive ROM Rt ?06/24/21 Lt ?  06/24/21 Rt ?06/28/21 Right ?07/03/21 Rt ?07/17/21  ?Knee flexion 95 115 104 115* ?seated 115 ?supine  ?Knee extension -3 -2 -1 -1*   ?  ?  ?LE MMT: ?  ?MMT Right ?06/24/21 Left ?06/24/21  ?Hip flexion 5/5 5/5  ?Hip extension      ?Hip abduction      ?Hip adduction 5/5 5/5  ?Hip internal rotation      ?Hip external rotation      ?Knee flexion 4-/5 5/5  ?Knee extension 3+/5 5/5  ?Ankle dorsiflexion      ?Ankle plantarflexion      ?Ankle inversion      ?Ankle eversion      ?(Blank rows = not tested) ?  ?FUNCTIONAL TESTS:  ?06/24/2021: 5 times sit to stand: 14 seconds with UE support with walker in front if needed.  ?  ?GAIT: ?06/24/2021  Distance walked: amb with rolling walker with mild antalgic gait with decreased terminal knee extension on Rt LE ?Assistive device utilized:  Environmental consultant - 2 wheeled ?Level of assistance: Modified independence ?Comments: see above ? ?TODAY'S TREATMENT: ?07/24/2021:  ?Recumbent bike L3 x 8 minutes, seat at 5  ?gastroc stretch 30 sec X 3 on  slantboard ?SL leg press on Rt 37# x 20 ?Step ups with Rt leg and down with left leg on 6inch step x 15 with bilat UE support ?SLS Airex with intermittent UE support on TM bar ?Seated LAQ Rt leg 5# 3X10 ?Lunges on 6 inch step with Rt knee into flexion x 20 holding 5 seconds each ?Vectors toward 2 cones on Left side positioned anterior-lateral and posterior-lateral ? ?Manual ?IASTM to Rt posterior knee, hamstring tendons using biofreeze ?PROM Rt knee flexion  ? ?Modalities ?Vasopneumatic: Rt knee 10 minutes, medium compression, 34 degrees ? ?07/19/2021:  ?Recumbent bike L3 x 10 minutes, seat at 6  ?gastroc stretch 30 sec X 3 on  slantboard ?SL leg press on Rt 31# 3X10 ?Step ups with Rt leg and down with left leg on 6inch step x 15 with bilat UE support ?Sit to stands no UE support slow eccentrics 2X10 left leg slightly in front for more weight shift onto Rt ?Seated LAQ Rt leg 5# 3X10 ?Manual therapy for Rt knee PROM, flexion mobs, manual hamstring and gastroc stretching to tolerance ?Vasopneumatic: Rt knee 10 minutes, medium compression, 34 degrees ? ?07/17/2021:  ?Recumbent bike L3 x 10 minutes, seat at 6  ?gastroc stretch and X 2  holding 30 seconds using lunge position and towel roll ?Hamstring stretch seated using strap x 3 holding 30 seconds ?Hamstring stretch x 2 holding 20 seconds in supine using strap ?IT band stretch x 2 holding 30 seconds using strap in supine  ?Step downs on 4 inch step x 20 with single UE support ?TRX squats 2 x10  ?Fitter Industrial/product designer, med/lateral and front/back movement x 2 minutes with UE support ?SLS: level ground ? ?Contract/relax in sitting with contralateral LE performing opposite motion ? ?Modalities:  ?Vasopneumatic: 10 minutes, medium compression, 34 degrees ? ? ?  ?PATIENT EDUCATION:   ?Education details: PT POC, HEP ?Person educated: Patient ?Education method: Explanation, Demonstration, Tactile cues, Verbal cues, and Handouts ?Education comprehension: verbalized understanding and returned demonstration ?  ?  ?HOME EXERCISE PROGRAM: ?Access Code: [email or web address] ?URL: [email or web address] ?Date: 06/24/2021 ?Prepared by: Kearney Hard ?  ?Exercises ?- Seated Long Arc Quad  - 3 x daily - 7 x weekly - 2 sets - 10 reps ?- Long Sitting Quad Set with Towel  Roll Under Heel  - 3 x daily - 7 x weekly - 2 sets - 10 reps - 5-10 seconds hold ?- Supine Heel Slide with Strap  - 3 x daily - 7 x weekly - 2 sets - 10 reps - 5 seconds hold ?- Sit to Stand with Counter Support  - 3 x daily - 7 x weekly - 2 sets - 10 reps ?  ?ASSESSMENT: ?  ?CLINICAL IMPRESSION: ?Pt still reporting pain in Rt posterior knee. IASTM performed which pt feels it looser and less tight. Pt tolerating all exercises well. Still reporting concerns of her left knee aches and pains at home with certain activities. Current Rt knee arc of motion is 2 - 108 degrees. Continue with skilled PT.  ? ?  ?OBJECTIVE IMPAIRMENTS Abnormal gait, decreased activity tolerance, decreased balance, decreased mobility, difficulty walking, decreased ROM, decreased strength, increased edema, impaired flexibility, postural dysfunction, and pain.  ?  ?ACTIVITY LIMITATIONS cleaning and community activity.  ?  ?PERSONAL FACTORS 3+ comorbidities: DM, hypothyroidism, HA  are also affecting patient's functional outcome.  ?  ?REHAB POTENTIAL: Good ?  ?CLINICAL DECISION MAKING: Stable/uncomplicated ?  ?EVALUATION COMPLEXITY: Low ?  ?  ?GOALS: ?Goals reviewed with patient? Yes ?Short term PT Goals (target date for Short term goals are 4 weeks 07/26/2021) ?Patient will demonstrate independent use of home exercise program to maintain progress from in clinic treatments. ?Goal status: MET 07/24/2021 ?  ?Long term PT goals (target dates for all long term goals are 8  weeks  08/23/2021 ) ?Patient will demonstrate/report pain at worst less than or equal to 2/10 to facilitate minimal limitation in daily activity secondary to pain symptoms. ?Goal status: on-going 07/17/2021 ?

## 2021-07-25 ENCOUNTER — Encounter: Payer: Self-pay | Admitting: Physical Therapy

## 2021-07-25 ENCOUNTER — Ambulatory Visit (INDEPENDENT_AMBULATORY_CARE_PROVIDER_SITE_OTHER): Payer: Medicare Other | Admitting: Physical Therapy

## 2021-07-25 DIAGNOSIS — M25661 Stiffness of right knee, not elsewhere classified: Secondary | ICD-10-CM

## 2021-07-25 DIAGNOSIS — G8929 Other chronic pain: Secondary | ICD-10-CM

## 2021-07-25 DIAGNOSIS — R2689 Other abnormalities of gait and mobility: Secondary | ICD-10-CM

## 2021-07-25 DIAGNOSIS — M25561 Pain in right knee: Secondary | ICD-10-CM

## 2021-07-25 DIAGNOSIS — R600 Localized edema: Secondary | ICD-10-CM | POA: Diagnosis not present

## 2021-07-25 DIAGNOSIS — M6281 Muscle weakness (generalized): Secondary | ICD-10-CM

## 2021-07-25 NOTE — Therapy (Signed)
?OUTPATIENT PHYSICAL THERAPY TREATMENT NOTE ? ? ? ?Patient Name: Krystal Lang ?MRN: [id number] ?DOB:October 15, 1955, 66 y.o., female ?Today's Date: 07/25/2021 ? ?PCP: Dixie Dials, MD ?REFERRING PROVIDER: Lynnell Jude MD ? ? PT End of Session - 07/25/21 0940   ? ? Visit Number 14   ? Number of Visits 24   ? Date for PT Re-Evaluation 08/23/21   ? Authorization Type Medicare, KX modifier starting at visit 11 (pt had 4 pre-op visits)   ? Progress Note Due on Visit 20   ? PT Start Time 0930   ? PT Stop Time 1015   ? PT Time Calculation (min) 45 min   ? Activity Tolerance Patient tolerated treatment well   ? Behavior During Therapy [facility] for tasks assessed/performed   ? ?  ?  ? ?  ? ? ? ? ? ? ? ? ? ? ? ? ? ?Past Medical History:  ?Diagnosis Date  ? Cataract   ? Mixed form OU  ? Diabetes mellitus   ? Fibroid uterus 2001  ? H/O fatigue 1998  ? Headache(784.0)   ? Hirsutism 1998  ? Idiopathic  ? History of chicken pox   ? Hypothyroidism   ? Menses, irregular 1998  ? ?Past Surgical History:  ?Procedure Laterality Date  ? DILATION AND CURETTAGE OF UTERUS  2002  ? In [place]  ? KNEE SURGERY    ? TONSILECTOMY, ADENOIDECTOMY, BILATERAL MYRINGOTOMY AND TUBES    ? TONSILLECTOMY    ? ?Patient Active Problem List  ? Diagnosis Date Noted  ? Bilateral primary osteoarthritis of knee 05/11/2017  ? Dyspareunia, female 11/13/2011  ? Type 2 diabetes mellitus ([place]) 11/13/2011  ? Alopecia 11/13/2011  ? History of chicken pox   ? Headache(784.0)   ? Hypothyroidism   ? Menses, irregular   ? Hirsutism   ? H/O fatigue   ? Fibroid uterus   ? ? ?REFERRING DIAG: Z96.659 (ICD-10-CM) - S/P total knee arthroplasty 06/05/2021 ? ?THERAPY DIAG:  ?Stiffness of right knee, not elsewhere classified ? ?Chronic pain of right knee ? ?Other abnormalities of gait and mobility ? ?Localized edema ? ?Muscle weakness (generalized) ? ?PERTINENT HISTORY: RT TKA on 06/05/2021 ? ?PRECAUTIONS: None ? ?SUBJECTIVE: Pt reporting new problem of burning sensations in her Rt  toesa ? ?PAIN:  ?Are you having pain?  Yes: NPRS scale: arrived 5/10 and Ranging 2/10 to 7/10 ?Pain location: knee anterior ?Pain description: intermittent heavy, tightness, sometimes sharp ?Aggravating factors: change position sleeping  ?Relieving factors: ice, meds Rx ? ?OBJECTIVE:  ?  ?DIAGNOSTIC FINDINGS: prior to surgery: significant tricompartmental arthritis predominantly in the medial compartment where there are areas of complete cartilage loss.  There is also a radial tear of the medial meniscus posteriorly. ?  ?PATIENT SURVEYS:  ?06/24/2021: FOTO 47% (predicted 62%) ?  ?POSTURE:  Rounded shoulders ?  ?PALPATION: ?06/24/2021 TTP: medial joint line, pt reporting lateral numbness ?           ?EDEMA: Rt: 43 centimeters, Left 38.5 centimeters ?  ?LE ROM: ?  ?Active ROM Right ?06/24/21 Left ?06/24/21 Right ?06/28/21 Right ?07/01/21 Right ?07/03/21 Rt ?07/10/21 Rt ?07/12/21 Rt ?07/15/21 Rt ?07/17/21 Rt ?07/24/21  ?Knee flexion 90 112 98 101* ?supine 103* ?supine 104  ?supine 109 PROM supine 105  ?supine ? 108 ?supine 108 ?supine  ?Knee extension -5 -2 -2 -2* ?supine -3* ?Seated LAQ -2 ?supine -1 ?supine -1 -2 -2  ?(Blank rows = not tested) ?  ?Passive ROM Rt ?06/24/21 Lt ?  06/24/21 Rt ?06/28/21 Right ?07/03/21 Rt ?07/17/21  ?Knee flexion 95 115 104 115* ?seated 115 ?supine  ?Knee extension -3 -2 -1 -1*   ?  ?  ?LE MMT: ?  ?MMT Right ?06/24/21 Left ?06/24/21  ?Hip flexion 5/5 5/5  ?Hip extension      ?Hip abduction      ?Hip adduction 5/5 5/5  ?Hip internal rotation      ?Hip external rotation      ?Knee flexion 4-/5 5/5  ?Knee extension 3+/5 5/5  ?Ankle dorsiflexion      ?Ankle plantarflexion      ?Ankle inversion      ?Ankle eversion      ?(Blank rows = not tested) ?  ?FUNCTIONAL TESTS:  ?06/24/2021: 5 times sit to stand: 14 seconds with UE support with walker in front if needed.  ?  ?GAIT: ?06/24/2021  Distance walked: amb with rolling walker with mild antalgic gait with decreased terminal knee extension on Rt LE ?Assistive device  utilized: Environmental consultant - 2 wheeled ?Level of assistance: Modified independence ?Comments: see above ? ?TODAY'S TREATMENT: ?07/25/2021:  ?Recumbent bike L3 x 8 minutes, seat 6  ?gastroc stretch 30 sec X 3 on  slantboard ?SL leg press on Rt 37# x 20 ?Step ups with Rt leg fwd and lateral X 10 ea on 6 inch step ?SLS Airex with intermittent UE support on TM bar 30 sec X 3 ?Seated LAQ Rt leg 5# 3X10 with DF/PF nerve glide ?Manual ?IASTM to Rt posterior knee, hamstring and gastrocs using biofreeze ?PROM Rt knee flexion  ?Rt knee fibular head mobs ?Rt leg long axis distraction ?Rt leg manual hamstring stretching 30 sec X3 ? ? ?07/24/2021:  ?Recumbent bike L3 x 8 minutes, seat at 6  ?gastroc stretch 30 sec X 3 on  slantboard ?SL leg press on Rt 37# x 20 ?Step ups with Rt leg and down with left leg on 6inch step x 15 with bilat UE support ?SLS Airex with intermittent UE support on TM bar ?Seated LAQ Rt leg 5# 3X10 ?Lunges on 6 inch step with Rt knee into flexion x 20 holding 5 seconds each ?Vectors toward 2 cones on Left side positioned anterior-lateral and posterior-lateral ? ?Manual ?IASTM to Rt posterior knee, hamstring tendons using biofreeze ?PROM Rt knee flexion  ? ?Modalities: Vasopneumatic: Rt knee 10 minutes, medium compression, 34 degrees ? ?Manual ?IASTM to Rt posterior knee, hamstring tendons using biofreeze ?PROM Rt knee flexion  ? ?Modalities ?Vasopneumatic: Rt knee 10 minutes, medium compression, 34 degrees ? ? ?  ?PATIENT EDUCATION:  ?Education details: PT POC, HEP ?Person educated: Patient ?Education method: Explanation, Demonstration, Tactile cues, Verbal cues, and Handouts ?Education comprehension: verbalized understanding and returned demonstration ?  ?  ?HOME EXERCISE PROGRAM: ?Access Code: [email or web address] ?URL: [email or web address] ?Date: 06/24/2021 ?Prepared by: Kearney Hard ?  ?Exercises ?- Seated Long Arc Quad  - 3 x daily - 7 x weekly - 2 sets - 10 reps ?- Long Sitting Quad Set with Towel Roll  Under Heel  - 3 x daily - 7 x weekly - 2 sets - 10 reps - 5-10 seconds hold ?- Supine Heel Slide with Strap  - 3 x daily - 7 x weekly - 2 sets - 10 reps - 5 seconds hold ?- Sit to Stand with Counter Support  - 3 x daily - 7 x weekly - 2 sets - 10 reps ?  ?ASSESSMENT: ?  ?CLINICAL IMPRESSION: ?She has new complaint of insidious onset of burning  sensations in her Rt toes, I did try sciatic nerve floss, fibular head mobs, and long axis distraction to see if this relieves this any. It did at least for short term but she did have reports of it coming back upon walking. We continued the IASTM to her posterior knee to reduce tightness and pain and I showed her husband how to perform this at home. ? ?  ?OBJECTIVE IMPAIRMENTS Abnormal gait, decreased activity tolerance, decreased balance, decreased mobility, difficulty walking, decreased ROM, decreased strength, increased edema, impaired flexibility, postural dysfunction, and pain.  ?  ?ACTIVITY LIMITATIONS cleaning and community activity.  ?  ?PERSONAL FACTORS 3+ comorbidities: DM, hypothyroidism, HA  are also affecting patient's functional outcome.  ?  ?REHAB POTENTIAL: Good ?  ?CLINICAL DECISION MAKING: Stable/uncomplicated ?  ?EVALUATION COMPLEXITY: Low ?  ?  ?GOALS: ?Goals reviewed with patient? Yes ?Short term PT Goals (target date for Short term goals are 4 weeks 07/26/2021) ?Patient will demonstrate independent use of home exercise program to maintain progress from in clinic treatments. ?Goal status: MET 07/24/2021 ?  ?Long term PT goals (target dates for all long term goals are 8 weeks  08/23/2021 ) ?Patient will demonstrate/report pain at worst less than or equal to 2/10 to facilitate minimal limitation in daily activity secondary to pain symptoms. ?Goal status: on-going 07/17/2021 ?  ?Patient will demonstrate independent use of home exercise program to facilitate ability to maintain/progress functional gains from skilled physical therapy services. ?Goal status:  on-going 07/17/2021 ?  ?Patient will demonstrate FOTO outcome > or = 62 % to indicate reduced disability due to condition. ?Goal status: 47% on 06/24/2021 at eval ? ?MET on 07/15/2021: 62% ? ?Pt will be able to amb wi

## 2021-07-30 ENCOUNTER — Ambulatory Visit (INDEPENDENT_AMBULATORY_CARE_PROVIDER_SITE_OTHER): Payer: Medicare Other | Admitting: Physical Therapy

## 2021-07-30 ENCOUNTER — Other Ambulatory Visit: Payer: Self-pay

## 2021-07-30 ENCOUNTER — Encounter: Payer: Self-pay | Admitting: Physical Therapy

## 2021-07-30 DIAGNOSIS — R600 Localized edema: Secondary | ICD-10-CM

## 2021-07-30 DIAGNOSIS — M25661 Stiffness of right knee, not elsewhere classified: Secondary | ICD-10-CM | POA: Diagnosis not present

## 2021-07-30 DIAGNOSIS — M25561 Pain in right knee: Secondary | ICD-10-CM

## 2021-07-30 DIAGNOSIS — M6281 Muscle weakness (generalized): Secondary | ICD-10-CM

## 2021-07-30 DIAGNOSIS — R2689 Other abnormalities of gait and mobility: Secondary | ICD-10-CM

## 2021-07-30 DIAGNOSIS — G8929 Other chronic pain: Secondary | ICD-10-CM

## 2021-07-30 NOTE — Therapy (Signed)
?OUTPATIENT PHYSICAL THERAPY TREATMENT NOTE ? ? ? ?Patient Name: Krystal Lang ?MRN: [id number] ?DOB:18-Jan-1956, 66 y.o., female ?Today's Date: 07/30/2021 ? ?PCP: Dixie Dials, MD ?REFERRING PROVIDER: Lynnell Jude MD ? ? PT End of Session - 07/30/21 1508   ? ? Visit Number 15   ? Number of Visits 24   ? Date for PT Re-Evaluation 08/23/21   ? Authorization Type Medicare, KX modifier starting at visit 11 (pt had 4 pre-op visits)   ? Progress Note Due on Visit 20   ? PT Start Time 1510   ? PT Stop Time 2080   ? PT Time Calculation (min) 55 min   ? Activity Tolerance Patient tolerated treatment well   ? Behavior During Therapy [facility] for tasks assessed/performed   ? ?  ?  ? ?  ? ? ? ? ? ? ? ? ? ? ? ? ? ? ?Past Medical History:  ?Diagnosis Date  ? Cataract   ? Mixed form OU  ? Diabetes mellitus   ? Fibroid uterus 2001  ? H/O fatigue 1998  ? Headache(784.0)   ? Hirsutism 1998  ? Idiopathic  ? History of chicken pox   ? Hypothyroidism   ? Menses, irregular 1998  ? ?Past Surgical History:  ?Procedure Laterality Date  ? DILATION AND CURETTAGE OF UTERUS  2002  ? In [place]  ? KNEE SURGERY    ? TONSILECTOMY, ADENOIDECTOMY, BILATERAL MYRINGOTOMY AND TUBES    ? TONSILLECTOMY    ? ?Patient Active Problem List  ? Diagnosis Date Noted  ? Bilateral primary osteoarthritis of knee 05/11/2017  ? Dyspareunia, female 11/13/2011  ? Type 2 diabetes mellitus ([place]) 11/13/2011  ? Alopecia 11/13/2011  ? History of chicken pox   ? Headache(784.0)   ? Hypothyroidism   ? Menses, irregular   ? Hirsutism   ? H/O fatigue   ? Fibroid uterus   ? ? ?REFERRING DIAG: Z96.659 (ICD-10-CM) - S/P total knee arthroplasty 06/05/2021 ? ?THERAPY DIAG:  ?Stiffness of right knee, not elsewhere classified ? ?Chronic pain of right knee ? ?Other abnormalities of gait and mobility ? ?Localized edema ? ?Muscle weakness (generalized) ? ?PERTINENT HISTORY: RT TKA on 06/05/2021 ? ?PRECAUTIONS: None ? ?SUBJECTIVE: She went to [facility] & had to stand for 1 hour.  Her right  knee was very painful.  She had to slowly increase her exercises.   ? ?PAIN:  ?Are you having pain?  Yes: NPRS scale: arrived 3/10 and Ranging 2/10 to 9/10 ?Pain location: knee anterior ?Pain description: intermittent heavy, tightness, sometimes sharp ?Aggravating factors: change position sleeping  ?Relieving factors: ice, meds Rx ? ?OBJECTIVE:  ?  ?DIAGNOSTIC FINDINGS: prior to surgery: significant tricompartmental arthritis predominantly in the medial compartment where there are areas of complete cartilage loss.  There is also a radial tear of the medial meniscus posteriorly. ?  ?PATIENT SURVEYS:  ?06/24/2021: FOTO 47% (predicted 62%) ?  ?POSTURE:  Rounded shoulders ?  ?PALPATION: ?06/24/2021 TTP: medial joint line, pt reporting lateral numbness ?           ?EDEMA: Rt: 43 centimeters, Left 38.5 centimeters ?  ?LE ROM: ?  ?Active ROM Right ?06/24/21 Left ?06/24/21 Right ?06/28/21 Right ?07/01/21 Right ?07/03/21 Rt ?07/10/21 Rt ?07/12/21 Rt ?07/15/21 Rt ?07/17/21 Rt ?07/24/21  ?Knee flexion 90 112 98 101* ?supine 103* ?supine 104  ?supine 109 PROM supine 105  ?supine ? 108 ?supine 108 ?supine  ?Knee extension -5 -2 -2 -2* ?supine -3* ?Seated LAQ -2 ?supine -  1 ?supine -1 -2 -2  ?(Blank rows = not tested) ?  ?Passive ROM Rt ?06/24/21 Lt ?06/24/21 Rt ?06/28/21 Right ?07/03/21 Rt ?07/17/21  ?Knee flexion 95 115 104 115* ?seated 115 ?supine  ?Knee extension -3 -2 -1 -1*   ?  ?  ?LE MMT: ?  ?MMT Right ?06/24/21 Left ?06/24/21  ?Hip flexion 5/5 5/5  ?Hip extension      ?Hip abduction      ?Hip adduction 5/5 5/5  ?Hip internal rotation      ?Hip external rotation      ?Knee flexion 4-/5 5/5  ?Knee extension 3+/5 5/5  ?Ankle dorsiflexion      ?Ankle plantarflexion      ?Ankle inversion      ?Ankle eversion      ?(Blank rows = not tested) ?  ?FUNCTIONAL TESTS:  ?06/24/2021: 5 times sit to stand: 14 seconds with UE support with walker in front if needed.  ?  ?GAIT: ?06/24/2021  Distance walked: amb with rolling walker with mild antalgic gait with  decreased terminal knee extension on Rt LE ?Assistive device utilized: Environmental consultant - 2 wheeled ?Level of assistance: Modified independence ?Comments: see above ? ?TODAY'S TREATMENT: ?07/30/2021 ?Recumbent bike L3, seat 6  x 6 minutes, then seat 5 for 2 minutes  ?gastroc stretch 30 sec X 3 on  slantboard ?Bil. Heel raises without UE support 15 reps.  ?leg press BLEs 100# 15 reps 1 set,  RLE only 43# x 20 ?Step ups with Rt leg fwd and lateral X 10 ea on 6 inch step ?Step downs eccentric RLE 4" step  x 10 ?Tandem stance RLE in front 30 sec & in back 30 sec ?RLE SLS Airex with intermittent UE support on TM bar 30 sec X 2 ?RLE SLS Airex dynamic touchin LLE ant-lat corner to post crossing midline back to ant-lat 5 reps and post-lat corner across midline to ant corner 5 reps.  ?Seated LAQ Rt leg 5# 2 X 10 with DF/PF nerve glide ?Manual ?IASTM to Rt posterior knee, hamstring and gastrocs using biofreeze ?PROM Rt knee flexion  ?Rt knee fibular head mobs ?Rt leg long axis distraction ?Rt leg manual hamstring stretching 30 sec X3 ? ?Patient anxious about increased pain with standing at [facility].  PT educated that sudden significant increases in activity level can cause pain.  PT recommended slow progress with work : rest during day.  Work can be standing for ADLs, doing exercises or walking. Pt verbalized understanding. PT answered patient's questions about skin color and no signs of infection noted.  ? ?07/25/2021:  ?Recumbent bike L3 x 8 minutes, seat 6  ?gastroc stretch 30 sec X 3 on  slantboard ?SL leg press on Rt 37# x 20 ?Step ups with Rt leg fwd and lateral X 10 ea on 6 inch step ?SLS Airex with intermittent UE support on TM bar 30 sec X 3 ?Seated LAQ Rt leg 5# 3X10 with DF/PF nerve glide ?Manual ?IASTM to Rt posterior knee, hamstring and gastrocs using biofreeze ?PROM Rt knee flexion  ?Rt knee fibular head mobs ?Rt leg long axis distraction ?Rt leg manual hamstring stretching 30 sec X3 ? ? ?07/24/2021:  ?Recumbent bike L3 x 8  minutes, seat at 6  ?gastroc stretch 30 sec X 3 on  slantboard ?SL leg press on Rt 37# x 20 ?Step ups with Rt leg and down with left leg on 6inch step x 15 with bilat UE support ?SLS Airex with intermittent UE support on  TM bar ?Seated LAQ Rt leg 5# 3X10 ?Lunges on 6 inch step with Rt knee into flexion x 20 holding 5 seconds each ?Vectors toward 2 cones on Left side positioned anterior-lateral and posterior-lateral ? ?Manual ?IASTM to Rt posterior knee, hamstring tendons using biofreeze ?PROM Rt knee flexion  ? ?Modalities: Vasopneumatic: Rt knee 10 minutes, medium compression, 34 degrees ? ?Manual ?IASTM to Rt posterior knee, hamstring tendons using biofreeze ?PROM Rt knee flexion  ? ?Modalities ?Vasopneumatic: Rt knee 10 minutes, medium compression, 34 degrees ? ? ?  ?PATIENT EDUCATION:  ?Education details: PT POC, HEP ?Person educated: Patient ?Education method: Explanation, Demonstration, Tactile cues, Verbal cues, and Handouts ?Education comprehension: verbalized understanding and returned demonstration ?  ?  ?HOME EXERCISE PROGRAM: ?Access Code: [email or web address] ?URL: [email or web address] ?Date: 06/24/2021 ?Prepared by: Kearney Hard ?  ?Exercises ?- Seated Long Arc Quad  - 3 x daily - 7 x weekly - 2 sets - 10 reps ?- Long Sitting Quad Set with Towel Roll Under Heel  - 3 x daily - 7 x weekly - 2 sets - 10 reps - 5-10 seconds hold ?- Supine Heel Slide with Strap  - 3 x daily - 7 x weekly - 2 sets - 10 reps - 5 seconds hold ?- Sit to Stand with Counter Support  - 3 x daily - 7 x weekly - 2 sets - 10 reps ?  ?ASSESSMENT: ?  ?CLINICAL IMPRESSION: ?Patient's strength and range are improving with exercises & manual therapy.  She had spike in pain with sudden increase in standing but appears to have recovered.  Her function appears to be improving.  Pt appears able to reduce frequency of PT to 2x/wk.  ? ?OBJECTIVE IMPAIRMENTS Abnormal gait, decreased activity tolerance, decreased balance, decreased mobility,  difficulty walking, decreased ROM, decreased strength, increased edema, impaired flexibility, postural dysfunction, and pain.  ?  ?ACTIVITY LIMITATIONS cleaning and community activity.  ?  ?PERSONAL FACTOR

## 2021-08-01 ENCOUNTER — Encounter: Payer: Self-pay | Admitting: Rehabilitative and Restorative Service Providers"

## 2021-08-01 ENCOUNTER — Ambulatory Visit (INDEPENDENT_AMBULATORY_CARE_PROVIDER_SITE_OTHER): Payer: Medicare Other | Admitting: Rehabilitative and Restorative Service Providers"

## 2021-08-01 DIAGNOSIS — M25561 Pain in right knee: Secondary | ICD-10-CM

## 2021-08-01 DIAGNOSIS — M6281 Muscle weakness (generalized): Secondary | ICD-10-CM

## 2021-08-01 DIAGNOSIS — R2689 Other abnormalities of gait and mobility: Secondary | ICD-10-CM

## 2021-08-01 DIAGNOSIS — M25661 Stiffness of right knee, not elsewhere classified: Secondary | ICD-10-CM

## 2021-08-01 DIAGNOSIS — R600 Localized edema: Secondary | ICD-10-CM | POA: Diagnosis not present

## 2021-08-01 DIAGNOSIS — G8929 Other chronic pain: Secondary | ICD-10-CM

## 2021-08-01 NOTE — Therapy (Signed)
?OUTPATIENT PHYSICAL THERAPY TREATMENT NOTE ? ? ? ?Patient Name: Krystal Lang ?MRN: [id number] ?DOB:[birth year], 66 y.o., female ?Today's Date: 08/01/2021 ? ?PCP: Dixie Dials, MD ?REFERRING PROVIDER: Lynnell Jude MD ? ? PT End of Session - 08/01/21 1524   ? ? Visit Number 16   ? Number of Visits 24   ? Date for PT Re-Evaluation 08/23/21   ? Authorization Type Medicare, KX modifier starting at visit 11 (pt had 4 pre-op visits)   ? Progress Note Due on Visit 20   ? PT Start Time 1512   ? PT Stop Time 4098   ? PT Time Calculation (min) 54 min   ? Activity Tolerance Patient tolerated treatment well   ? Behavior During Therapy [facility] for tasks assessed/performed   ? ?  ?  ? ?  ? ? ? ? ? ? ? ? ? ? ? ? ? ? ? ?Past Medical History:  ?Diagnosis Date  ? Cataract   ? Mixed form OU  ? Diabetes mellitus   ? Fibroid uterus 2001  ? H/O fatigue 1998  ? Headache(784.0)   ? Hirsutism 1998  ? Idiopathic  ? History of chicken pox   ? Hypothyroidism   ? Menses, irregular 1998  ? ?Past Surgical History:  ?Procedure Laterality Date  ? DILATION AND CURETTAGE OF UTERUS  2002  ? In [place]  ? KNEE SURGERY    ? TONSILECTOMY, ADENOIDECTOMY, BILATERAL MYRINGOTOMY AND TUBES    ? TONSILLECTOMY    ? ?Patient Active Problem List  ? Diagnosis Date Noted  ? Bilateral primary osteoarthritis of knee 05/11/2017  ? Dyspareunia, female 11/13/2011  ? Type 2 diabetes mellitus ([place]) 11/13/2011  ? Alopecia 11/13/2011  ? History of chicken pox   ? Headache(784.0)   ? Hypothyroidism   ? Menses, irregular   ? Hirsutism   ? H/O fatigue   ? Fibroid uterus   ? ? ?REFERRING DIAG: Z96.659 (ICD-10-CM) - S/P total knee arthroplasty 06/05/2021 ? ?THERAPY DIAG:  ?Stiffness of right knee, not elsewhere classified ? ?Chronic pain of right knee ? ?Other abnormalities of gait and mobility ? ?Localized edema ? ?Muscle weakness (generalized) ? ?PERTINENT HISTORY: RT TKA on 06/05/2021 ? ?PRECAUTIONS: None ? ?SUBJECTIVE: Pt indicated 3-4/10 complaints upon arrival today with  some increase at night since the incident of prolonged standing at the store.  ? ?PAIN:  ?Are you having pain?  Yes: NPRS scale: arrived 3-4/10 ?Pain location: knee anterior ?Pain description: intermittent heavy, tightness, sometimes sharp ?Aggravating factors: change position sleeping  ?Relieving factors: ice, meds Rx ? ?OBJECTIVE:  ?  ?DIAGNOSTIC FINDINGS: prior to surgery: significant tricompartmental arthritis predominantly in the medial compartment where there are areas of complete cartilage loss.  There is also a radial tear of the medial meniscus posteriorly. ?  ?PATIENT SURVEYS:  ?06/24/2021: FOTO 47% (predicted 62%) ?  ?POSTURE:  Rounded shoulders ?  ?PALPATION: ?06/24/2021 TTP: medial joint line, pt reporting lateral numbness ?           ?EDEMA: Rt: 43 centimeters, Left 38.5 centimeters ?  ?LE ROM: ?  ?Active ROM Right ?06/24/21 Left ?06/24/21 Right ?06/28/21 Right ?07/01/21 Right ?07/03/21 Rt ?07/10/21 Rt ?07/12/21 Rt ?07/15/21 Rt ?07/17/21 Rt ?07/24/21  ?Knee flexion 90 112 98 101* ?supine 103* ?supine 104  ?supine 109 PROM supine 105  ?supine ? 108 ?supine 108 ?supine  ?Knee extension -5 -2 -2 -2* ?supine -3* ?Seated LAQ -2 ?supine -1 ?supine -1 -2 -2  ?(Blank rows = not  tested) ?  ?Passive ROM Rt ?06/24/21 Lt ?06/24/21 Rt ?06/28/21 Right ?07/03/21 Rt ?07/17/21  ?Knee flexion 95 115 104 115* ?seated 115 ?supine  ?Knee extension -3 -2 -1 -1*   ?  ?  ?LE MMT: ?  ?MMT Right ?06/24/21 Left ?06/24/21 Right ?08/01/2021  ?Hip flexion 5/5 5/5   ?Hip extension       ?Hip abduction       ?Hip adduction 5/5 5/5   ?Hip internal rotation       ?Hip external rotation       ?Knee flexion 4-/5 5/5   ?Knee extension 3+/5 5/5 4/5  ?Ankle dorsiflexion       ?Ankle plantarflexion       ?Ankle inversion       ?Ankle eversion       ?(Blank rows = not tested) ?  ?FUNCTIONAL TESTS:  ?06/24/2021: 5 times sit to stand: 14 seconds with UE support with walker in front if needed.  ?  ?GAIT: ?06/24/2021  Distance walked: amb with rolling walker with mild  antalgic gait with decreased terminal knee extension on Rt LE ?Assistive device utilized: Environmental consultant - 2 wheeled ?Level of assistance: Modified independence ?Comments: see above ? ?TODAY'S TREATMENT: ?08/01/2021 ?Therex: ? UBE Lvl 2.0 8 mins  ? Runner stretch incline board 30 sec x 3 c cues to press heel into floor ?Leg press Rt leg only 43 lbs 2 x 15 ? Machine knee extension double leg up, Rt leg eccentric lowering 2 x 10 (slot 3 from back) ? Machine knee flexion Rt leg only 2 x 10 15 lbs (slot 3 from back) ? Seated Rt leg quad set 5 sec hold x 10  ? Seated SLR Rt x 15 c slow focus and pause in lift ? ?Vasopneumatic ? Supine Rt knee 34 degrees medium compression 10 mins ? ? ?07/30/2021 ?Recumbent bike L3, seat 6  x 6 minutes, then seat 5 for 2 minutes  ?gastroc stretch 30 sec X 3 on  slantboard ?Bil. Heel raises without UE support 15 reps.  ?leg press BLEs 100# 15 reps 1 set,  RLE only 43# x 20 ?Step ups with Rt leg fwd and lateral X 10 ea on 6 inch step ?Step downs eccentric RLE 4" step  x 10 ?Tandem stance RLE in front 30 sec & in back 30 sec ?RLE SLS Airex with intermittent UE support on TM bar 30 sec X 2 ?RLE SLS Airex dynamic touchin LLE ant-lat corner to post crossing midline back to ant-lat 5 reps and post-lat corner across midline to ant corner 5 reps.  ?Seated LAQ Rt leg 5# 2 X 10 with DF/PF nerve glide ?Manual ?IASTM to Rt posterior knee, hamstring and gastrocs using biofreeze ?PROM Rt knee flexion  ?Rt knee fibular head mobs ?Rt leg long axis distraction ?Rt leg manual hamstring stretching 30 sec X3 ? ?Patient anxious about increased pain with standing at [facility].  PT educated that sudden significant increases in activity level can cause pain.  PT recommended slow progress with work : rest during day.  Work can be standing for ADLs, doing exercises or walking. Pt verbalized understanding. PT answered patient's questions about skin color and no signs of infection noted.  ? ?07/25/2021:  ?Recumbent bike L3 x 8  minutes, seat 6  ?gastroc stretch 30 sec X 3 on  slantboard ?SL leg press on Rt 37# x 20 ?Step ups with Rt leg fwd and lateral X 10 ea on 6 inch step ?SLS Airex  with intermittent UE support on TM bar 30 sec X 3 ?Seated LAQ Rt leg 5# 3X10 with DF/PF nerve glide ?Manual ?IASTM to Rt posterior knee, hamstring and gastrocs using biofreeze ?PROM Rt knee flexion  ?Rt knee fibular head mobs ?Rt leg long axis distraction ?Rt leg manual hamstring stretching 30 sec X3 ? ? ?  ?PATIENT EDUCATION:  ?Education details: PT POC, HEP ?Person educated: Patient ?Education method: Explanation, Demonstration, Tactile cues, Verbal cues, and Handouts ?Education comprehension: verbalized understanding and returned demonstration ?  ?  ?HOME EXERCISE PROGRAM: ?Access Code: [email or web address] ?URL: [email or web address] ?Date: 06/24/2021 ?Prepared by: Kearney Hard ?  ?Exercises ?- Seated Long Arc Quad  - 3 x daily - 7 x weekly - 2 sets - 10 reps ?- Long Sitting Quad Set with Towel Roll Under Heel  - 3 x daily - 7 x weekly - 2 sets - 10 reps - 5-10 seconds hold ?- Supine Heel Slide with Strap  - 3 x daily - 7 x weekly - 2 sets - 10 reps - 5 seconds hold ?- Sit to Stand with Counter Support  - 3 x daily - 7 x weekly - 2 sets - 10 reps ?  ?ASSESSMENT: ?  ?CLINICAL IMPRESSION: ?Continued aggravation related to prolonged standing incident reported from past weekend.  Due to complaints, reduced standing and WB activity to accommodate symptoms. Reviewed importance of consistent tolerable mobility intervention at home.  ? ?OBJECTIVE IMPAIRMENTS Abnormal gait, decreased activity tolerance, decreased balance, decreased mobility, difficulty walking, decreased ROM, decreased strength, increased edema, impaired flexibility, postural dysfunction, and pain.  ?  ?ACTIVITY LIMITATIONS cleaning and community activity.  ?  ?PERSONAL FACTORS 3+ comorbidities: DM, hypothyroidism, HA  are also affecting patient's functional outcome.  ?  ?REHAB POTENTIAL:  Good ?  ?CLINICAL DECISION MAKING: Stable/uncomplicated ?  ?EVALUATION COMPLEXITY: Low ?  ?  ?GOALS: ?Goals reviewed with patient? Yes ?Short term PT Goals (target date for Short term goals are 4 weeks 4/28/202

## 2021-08-05 ENCOUNTER — Ambulatory Visit (INDEPENDENT_AMBULATORY_CARE_PROVIDER_SITE_OTHER): Payer: Medicare Other | Admitting: Physical Therapy

## 2021-08-05 ENCOUNTER — Encounter: Payer: Self-pay | Admitting: Physical Therapy

## 2021-08-05 DIAGNOSIS — M6281 Muscle weakness (generalized): Secondary | ICD-10-CM

## 2021-08-05 DIAGNOSIS — M25661 Stiffness of right knee, not elsewhere classified: Secondary | ICD-10-CM

## 2021-08-05 DIAGNOSIS — R600 Localized edema: Secondary | ICD-10-CM | POA: Diagnosis not present

## 2021-08-05 DIAGNOSIS — M25561 Pain in right knee: Secondary | ICD-10-CM | POA: Diagnosis not present

## 2021-08-05 DIAGNOSIS — R2689 Other abnormalities of gait and mobility: Secondary | ICD-10-CM

## 2021-08-05 DIAGNOSIS — G8929 Other chronic pain: Secondary | ICD-10-CM

## 2021-08-05 NOTE — Therapy (Signed)
?OUTPATIENT PHYSICAL THERAPY TREATMENT NOTE ? ? ? ?Patient Name: Krystal Lang ?MRN: [id number] ?DOB:28-May-1955, 66 y.o., female ?Today's Date: 08/05/2021 ? ?PCP: Dixie Dials, MD ?REFERRING PROVIDER: Lynnell Jude MD ? ? PT End of Session - 08/05/21 0944   ? ? Visit Number 17   ? Number of Visits 24   ? Date for PT Re-Evaluation 08/23/21   ? Authorization Type Medicare, KX modifier starting at visit 11 (pt had 4 pre-op visits)   ? Progress Note Due on Visit 20   ? PT Start Time 6073   ? PT Stop Time 1025   ? PT Time Calculation (min) 50 min   ? Activity Tolerance Patient tolerated treatment well   ? Behavior During Therapy [facility] for tasks assessed/performed   ? ?  ?  ? ?  ? ? ? ? ? ? ? ? ? ? ? ? ? ? ? ?Past Medical History:  ?Diagnosis Date  ? Cataract   ? Mixed form OU  ? Diabetes mellitus   ? Fibroid uterus 2001  ? H/O fatigue 1998  ? Headache(784.0)   ? Hirsutism 1998  ? Idiopathic  ? History of chicken pox   ? Hypothyroidism   ? Menses, irregular 1998  ? ?Past Surgical History:  ?Procedure Laterality Date  ? DILATION AND CURETTAGE OF UTERUS  2002  ? In [place]  ? KNEE SURGERY    ? TONSILECTOMY, ADENOIDECTOMY, BILATERAL MYRINGOTOMY AND TUBES    ? TONSILLECTOMY    ? ?Patient Active Problem List  ? Diagnosis Date Noted  ? Bilateral primary osteoarthritis of knee 05/11/2017  ? Dyspareunia, female 11/13/2011  ? Type 2 diabetes mellitus ([place]) 11/13/2011  ? Alopecia 11/13/2011  ? History of chicken pox   ? Headache(784.0)   ? Hypothyroidism   ? Menses, irregular   ? Hirsutism   ? H/O fatigue   ? Fibroid uterus   ? ? ?REFERRING DIAG: Z96.659 (ICD-10-CM) - S/P total knee arthroplasty 06/05/2021 ? ?THERAPY DIAG:  ?Stiffness of right knee, not elsewhere classified ? ?Chronic pain of right knee ? ?Other abnormalities of gait and mobility ? ?Localized edema ? ?Muscle weakness (generalized) ? ?PERTINENT HISTORY: RT TKA on 06/05/2021 ? ?PRECAUTIONS: None ? ?SUBJECTIVE: Pt indicated 3/10 pain in the back of her leg, she  wants HEP revision to include most important exercises and gym activity. She would like to come 3 times per week If possible ?PAIN:  ?Are you having pain?  Yes: NPRS scale: arrived 3-4/10 ?Pain location: knee anterior ?Pain description: intermittent heavy, tightness, sometimes sharp ?Aggravating factors: change position sleeping  ?Relieving factors: ice, meds Rx ? ?OBJECTIVE:  ?  ?DIAGNOSTIC FINDINGS: prior to surgery: significant tricompartmental arthritis predominantly in the medial compartment where there are areas of complete cartilage loss.  There is also a radial tear of the medial meniscus posteriorly. ?  ?PATIENT SURVEYS:  ?06/24/2021: FOTO 47% (predicted 62%) ?  ?POSTURE:  Rounded shoulders ?  ?PALPATION: ?06/24/2021 TTP: medial joint line, pt reporting lateral numbness ?           ?EDEMA: Rt: 43 centimeters, Left 38.5 centimeters ?  ?LE ROM: ?  ?Active ROM Right ?06/24/21 Left ?06/24/21 Right ?06/28/21 Right ?07/01/21 Right ?07/03/21 Rt ?07/10/21 Rt ?07/12/21 Rt ?07/15/21 Rt ?07/17/21 Rt ?07/24/21 Rt ?08/05/21  ?Knee flexion 90 112 98 101* ?supine 103* ?supine 104  ?supine 109 PROM supine 105  ?supine ? 108 ?supine 108 ?supine 112 supine  ?Knee extension -5 -2 -2 -2* ?supine -3* ?  Seated LAQ -2 ?supine -1 ?supine -1 -2 -2 -1  ?(Blank rows = not tested) ?  ?Passive ROM Rt ?06/24/21 Lt ?06/24/21 Rt ?06/28/21 Right ?07/03/21 Rt ?07/17/21  ?Knee flexion 95 115 104 115* ?seated 115 ?supine  ?Knee extension -3 -2 -1 -1*   ?  ?  ?LE MMT: ?  ?MMT Right ?06/24/21 Left ?06/24/21 Right ?08/01/2021  ?Hip flexion 5/5 5/5   ?Hip extension       ?Hip abduction       ?Hip adduction 5/5 5/5   ?Hip internal rotation       ?Hip external rotation       ?Knee flexion 4-/5 5/5   ?Knee extension 3+/5 5/5 4/5  ?Ankle dorsiflexion       ?Ankle plantarflexion       ?Ankle inversion       ?Ankle eversion       ?(Blank rows = not tested) ?  ?FUNCTIONAL TESTS:  ?06/24/2021: 5 times sit to stand: 14 seconds with UE support with walker in front if needed.  ?   ?GAIT: ?06/24/2021  Distance walked: amb with rolling walker with mild antalgic gait with decreased terminal knee extension on Rt LE ?Assistive device utilized: Environmental consultant - 2 wheeled ?Level of assistance: Modified independence ?Comments: see above ? ?TODAY'S TREATMENT: ?08/05/2021 ?Sci fit bike   ?gastroc stretch 30 sec X 3 on  slantboard ?Hamstring stretch 3 X20 sec ?Quad stretch 3X30 seconds supine with leg off EOB ?Step ups with Rt leg fwd up with right and down in front with left X 10 on 6 inch step ?Sit to stands no UE support 2X10 ?Seated LAQ Rt leg 5# 3 X 10 with DF/PF nerve glide ?Manual ?IASTM to Rt posterior knee, hamstring and gastrocs using biofreeze ?PROM Rt knee flexion and extension ?Rt leg manual quad stretching 30 sec X3 ? ?08/01/2021 ?Therex: ? UBE Lvl 2.0 8 mins  ? Runner stretch incline board 30 sec x 3 c cues to press heel into floor ?Leg press Rt leg only 43 lbs 2 x 15 ? Machine knee extension double leg up, Rt leg eccentric lowering 2 x 10 (slot 3 from back) ? Machine knee flexion Rt leg only 2 x 10 15 lbs (slot 3 from back) ? Seated Rt leg quad set 5 sec hold x 10  ? Seated SLR Rt x 15 c slow focus and pause in lift ? ?Vasopneumatic ? Supine Rt knee 34 degrees medium compression 10 mins ? ? ?07/30/2021 ?Recumbent bike L3, seat 6  x 6 minutes, then seat 5 for 2 minutes  ?gastroc stretch 30 sec X 3 on  slantboard ?Bil. Heel raises without UE support 15 reps.  ?leg press BLEs 100# 15 reps 1 set,  RLE only 43# x 20 ?Step ups with Rt leg fwd and lateral X 10 ea on 6 inch step ?Step downs eccentric RLE 4" step  x 10 ?Tandem stance RLE in front 30 sec & in back 30 sec ?RLE SLS Airex with intermittent UE support on TM bar 30 sec X 2 ?RLE SLS Airex dynamic touchin LLE ant-lat corner to post crossing midline back to ant-lat 5 reps and post-lat corner across midline to ant corner 5 reps.  ?Seated LAQ Rt leg 5# 2 X 10 with DF/PF nerve glide ?Manual ?IASTM to Rt posterior knee, hamstring and gastrocs using  biofreeze ?PROM Rt knee flexion  ?Rt knee fibular head mobs ?Rt leg long axis distraction ?Rt leg manual hamstring stretching 30  sec X3 ? ?Patient anxious about increased pain with standing at [facility].  PT educated that sudden significant increases in activity level can cause pain.  PT recommended slow progress with work : rest during day.  Work can be standing for ADLs, doing exercises or walking. Pt verbalized understanding. PT answered patient's questions about skin color and no signs of infection noted.  ? ?07/25/2021:  ?Recumbent bike L3 x 8 minutes, seat 6  ?gastroc stretch 30 sec X 3 on  slantboard ?SL leg press on Rt 37# x 20 ?Step ups with Rt leg fwd and lateral X 10 ea on 6 inch step ?SLS Airex with intermittent UE support on TM bar 30 sec X 3 ?Seated LAQ Rt leg 5# 3X10 with DF/PF nerve glide ?Manual ?IASTM to Rt posterior knee, hamstring and gastrocs using biofreeze ?PROM Rt knee flexion  ?Rt knee fibular head mobs ?Rt leg long axis distraction ?Rt leg manual hamstring stretching 30 sec X3 ? ? ?  ?PATIENT EDUCATION:  ?Education details: PT POC, HEP ?Person educated: Patient ?Education method: Explanation, Demonstration, Tactile cues, Verbal cues, and Handouts ?Education comprehension: verbalized understanding and returned demonstration ?  ?  ?HOME EXERCISE PROGRAM: ?Access Code: [email or web address] ?URL: [email or web address] ?Date: 08/05/2021 ?Prepared by: Elsie Ra ? ?Exercises ?- Seated Knee Flexion Stretch  - 2 x daily - 6 x weekly - 1-2 sets - 10 reps - 5 sec hold ?- Supine Quadriceps Stretch with Strap on Table  - 1-2 x daily - 6 x weekly - 1 sets - 2-3 reps - 30 hold ?- Seated Hamstring Stretch  - 1-2 x daily - 6 x weekly - 1 sets - 3 reps - 30 hold ?- Sit to Stand  - 2 x daily - 6 x weekly - 1-2 sets - 10 reps ?- Recumbent Bike  - 1 x daily - 3 x weekly - 10-15 min hold ?- Single Leg Knee Extension with Weight Machine  - 1 x daily - 2-3 x weekly - 2-3 sets - 10 reps ?- Hamstring Curl with  Weight Machine  - 1 x daily - 2-3 x weekly - 2-3 sets - 10 reps ?- Hip Abduction Machine  - 1 x daily - 2-3 x weekly - 2-3 sets - 10 reps ?  ?ASSESSMENT: ?  ?CLINICAL IMPRESSION: ?Her ROM and strength are progressing alo

## 2021-08-07 ENCOUNTER — Encounter: Payer: Self-pay | Admitting: Orthopaedic Surgery

## 2021-08-07 ENCOUNTER — Ambulatory Visit (INDEPENDENT_AMBULATORY_CARE_PROVIDER_SITE_OTHER): Payer: Medicare Other | Admitting: Orthopaedic Surgery

## 2021-08-07 ENCOUNTER — Ambulatory Visit (INDEPENDENT_AMBULATORY_CARE_PROVIDER_SITE_OTHER): Payer: Medicare Other

## 2021-08-07 ENCOUNTER — Encounter: Payer: Medicare Other | Admitting: Physical Therapy

## 2021-08-07 DIAGNOSIS — M25562 Pain in left knee: Secondary | ICD-10-CM

## 2021-08-07 DIAGNOSIS — M170 Bilateral primary osteoarthritis of knee: Secondary | ICD-10-CM

## 2021-08-07 DIAGNOSIS — G8929 Other chronic pain: Secondary | ICD-10-CM

## 2021-08-07 NOTE — Progress Notes (Signed)
? ?Office Visit Note ?  ?Patient: Krystal Lang           ?Date of Birth: 01-31-1956           ?MRN: [id number] ?Visit Date: 08/07/2021 ?             ?Requested by: Dixie Dials, MD ?[address] ?[address] ?[place],  [place] [address] ?PCP: Dixie Dials, MD ? ? ?Assessment & Plan: ?Visit Diagnoses:  ?1. Chronic pain of left knee   ?2. Bilateral primary osteoarthritis of knee   ? ? ?Plan: [name] had a right total knee replacement several months ago in [place].  She came to the office today with multiple questions regarding her right knee and her left knee.  I have reviewed x-rays of her right total knee replacement I think the components are in excellent position.  Clinically her leg looks great she is got full extension and probably 100 degrees or so of flexion without instability.  I think her knee is doing just fine but obviously somewhat limited and not knowing what it was like directly postoperatively.  She like to have more flexion but she just needs to continue working with her therapy and her exercises and follow-up with her Psychologist, sport and exercise. ? ?In terms of her left knee she has had some mild progression of the arthritis from her films in 2021.  She is probably had an exacerbation of her pain based on the fact that she shifted her weight to the left knee from the right postoperatively.  Again exercises are helpful.  She might want to consider viscosupplementation but before we proceed with that she should check with her [facility]. ?Office visit over 45 minutes over 50% of the time in counseling and discussing both of her knees ? ?Follow-Up Instructions: Return if symptoms worsen or fail to improve.  ? ?Orders:  ?Orders Placed This Encounter  ?Procedures  ? XR KNEE 3 VIEW LEFT  ? ?No orders of the defined types were placed in this encounter. ? ? ? ? Procedures: ?No procedures performed ? ? ?Clinical Data: ?No additional findings. ? ? ?Subjective: ?Chief Complaint  ?Patient presents with  ? Left Knee - Pain   ?Patient presents today for left knee pain. She said that her left knee started to hurt a month ago, just one month after having her right knee replaced with a doctor in [place]. She said that it hurts medially. No injury. She takes over the counter pain medicine as needed.  No history of fever or chills.  Presently not walking with ambulatory aid ? ?HPI ? ?Review of Systems ? ? ?Objective: ?Vital Signs: There were no vitals taken for this visit. ? ?Physical Exam ?Constitutional:   ?   Appearance: She is well-developed.  ?Eyes:  ?   Pupils: Pupils are equal, round, and reactive to light.  ?Pulmonary:  ?   Effort: Pulmonary effort is normal.  ?Skin: ?   General: Skin is warm and dry.  ?Neurological:  ?   Mental Status: She is alert and oriented to person, place, and time.  ?Psychiatric:     ?   Behavior: Behavior normal.  ? ? ?Ortho Exam awake alert and oriented x3.  Comfortable sitting left total knee replacement incision is healed very nicely there is very little swelling or induration about the knee excellent alignment full extension about 100 degrees of flexion.  No instability.  No calf pain.  Motor exam intact. ? ?Increased varus of the left knee  consistent with her osteoarthritis.  There is no effusion.  Mild to moderate diffuse medial joint pain.  There is some patella crepitation but not much pain with compression.  Full extension flexes to over 100 degrees without instability.  No popliteal pain or mass.  No calf pain ? ?Specialty Comments:  ?No specialty comments available. ? ?Imaging: ?XR KNEE 3 VIEW LEFT ? ?Result Date: 08/07/2021 ?Of the left knee obtained in several projections standing.  These were compared to films performed in 2021.  There is some slight progression of the osteoarthritis in all 3 compartments.  The peripheral osteophytes in the lateral compartment are larger.  There is slightly more varus and some narrowing of the medial joint space associated with osteophytes and subchondral  sclerosis.  Minimal change in over the last 2 years.  There are also degenerative changes at the patellofemoral joint with subchondral sclerosis joint space narrowing and peripheral osteophytes.  Films are consistent with advanced osteoarthritis with some progression in the last 2 years.  No acute change  ? ? ?PMFS History: ?Patient Active Problem List  ? Diagnosis Date Noted  ? Bilateral primary osteoarthritis of knee 05/11/2017  ? Dyspareunia, female 11/13/2011  ? Type 2 diabetes mellitus ([place]) 11/13/2011  ? Alopecia 11/13/2011  ? History of chicken pox   ? Headache(784.0)   ? Hypothyroidism   ? Menses, irregular   ? Hirsutism   ? H/O fatigue   ? Fibroid uterus   ? ?Past Medical History:  ?Diagnosis Date  ? Cataract   ? Mixed form OU  ? Diabetes mellitus   ? Fibroid uterus 2001  ? H/O fatigue 1998  ? Headache(784.0)   ? Hirsutism 1998  ? Idiopathic  ? History of chicken pox   ? Hypothyroidism   ? Menses, irregular 1998  ?  ?Family History  ?Problem Relation Age of Onset  ? Diabetes Mother   ? Diabetes Father   ? Heart disease Father   ?     MI  ? Diabetes Sister   ? Macular degeneration Sister   ? Diabetes Brother   ? Heart disease Brother   ?     Angioplasty  ?  ?Past Surgical History:  ?Procedure Laterality Date  ? DILATION AND CURETTAGE OF UTERUS  2002  ? In [place]  ? KNEE SURGERY    ? TONSILECTOMY, ADENOIDECTOMY, BILATERAL MYRINGOTOMY AND TUBES    ? TONSILLECTOMY    ? ?Social History  ? ?Occupational History  ? Not on file  ?Tobacco Use  ? Smoking status: Never  ? Smokeless tobacco: Never  ?Vaping Use  ? Vaping Use: Never used  ?Substance and Sexual Activity  ? Alcohol use: No  ? Drug use: No  ? Sexual activity: Yes  ?  Birth control/protection: Post-menopausal  ? ? ? ? ? ? ?

## 2021-08-09 ENCOUNTER — Encounter: Payer: Self-pay | Admitting: Rehabilitative and Restorative Service Providers"

## 2021-08-09 ENCOUNTER — Ambulatory Visit (INDEPENDENT_AMBULATORY_CARE_PROVIDER_SITE_OTHER): Payer: Medicare Other | Admitting: Rehabilitative and Restorative Service Providers"

## 2021-08-09 DIAGNOSIS — R600 Localized edema: Secondary | ICD-10-CM

## 2021-08-09 DIAGNOSIS — M25661 Stiffness of right knee, not elsewhere classified: Secondary | ICD-10-CM

## 2021-08-09 DIAGNOSIS — G8929 Other chronic pain: Secondary | ICD-10-CM

## 2021-08-09 DIAGNOSIS — M25561 Pain in right knee: Secondary | ICD-10-CM

## 2021-08-09 DIAGNOSIS — R2689 Other abnormalities of gait and mobility: Secondary | ICD-10-CM

## 2021-08-09 DIAGNOSIS — M6281 Muscle weakness (generalized): Secondary | ICD-10-CM

## 2021-08-09 NOTE — Therapy (Signed)
?OUTPATIENT PHYSICAL THERAPY TREATMENT NOTE ? ? ? ?Patient Name: Krystal Lang ?MRN: [id number] ?DOB:May 18, 1955, 66 y.o., female ?Today's Date: 08/09/2021 ? ?PCP: Dixie Dials, MD ?REFERRING PROVIDER: Lynnell Jude MD ? ? PT End of Session - 08/09/21 1022   ? ? Visit Number 18   ? Number of Visits 24   ? Date for PT Re-Evaluation 08/23/21   ? Authorization Type Medicare, KX modifier starting at visit 11 (pt had 4 pre-op visits)   ? Progress Note Due on Visit 20   ? PT Start Time 1015   ? PT Stop Time 1108   ? PT Time Calculation (min) 53 min   ? Activity Tolerance Patient tolerated treatment well;No increased pain   ? Behavior During Therapy [facility] for tasks assessed/performed   ? ?  ?  ? ?  ? ? ? ? ? ? ? ? ? ? ? ? ? ? ? ? ?Past Medical History:  ?Diagnosis Date  ? Cataract   ? Mixed form OU  ? Diabetes mellitus   ? Fibroid uterus 2001  ? H/O fatigue 1998  ? Headache(784.0)   ? Hirsutism 1998  ? Idiopathic  ? History of chicken pox   ? Hypothyroidism   ? Menses, irregular 1998  ? ?Past Surgical History:  ?Procedure Laterality Date  ? DILATION AND CURETTAGE OF UTERUS  2002  ? In [place]  ? KNEE SURGERY    ? TONSILECTOMY, ADENOIDECTOMY, BILATERAL MYRINGOTOMY AND TUBES    ? TONSILLECTOMY    ? ?Patient Active Problem List  ? Diagnosis Date Noted  ? Bilateral primary osteoarthritis of knee 05/11/2017  ? Dyspareunia, female 11/13/2011  ? Type 2 diabetes mellitus ([place]) 11/13/2011  ? Alopecia 11/13/2011  ? History of chicken pox   ? Headache(784.0)   ? Hypothyroidism   ? Menses, irregular   ? Hirsutism   ? H/O fatigue   ? Fibroid uterus   ? ? ?REFERRING DIAG: Z96.659 (ICD-10-CM) - S/P total knee arthroplasty 06/05/2021 ? ?THERAPY DIAG:  ?Other abnormalities of gait and mobility ? ?Stiffness of right knee, not elsewhere classified ? ?Chronic pain of right knee ? ?Localized edema ? ?Muscle weakness (generalized) ? ?PERTINENT HISTORY: RT TKA on 06/05/2021 ? ?PRECAUTIONS: None ? ?SUBJECTIVE: [name] notes her R knee is doing  well other than occasional posterior knee pain.  Her L knee is very sore today. ? ?PAIN:  ?Are you having pain?  Yes: NPRS scale: 2-5/10 ?Pain location: B knees (L > R today) ?Pain description: intermittent heavy, tightness, sometimes sharp ?Aggravating factors: change position sleeping  ?Relieving factors: ice, meds Rx ? ?OBJECTIVE:  ?  ?DIAGNOSTIC FINDINGS: prior to surgery: significant tricompartmental arthritis predominantly in the medial compartment where there are areas of complete cartilage loss.  There is also a radial tear of the medial meniscus posteriorly. ?  ?PATIENT SURVEYS:  ?08/09/2021: FOTO  ?06/24/2021: FOTO 47% (predicted 62%) ?  ?POSTURE:  Rounded shoulders ?  ?PALPATION: ?06/24/2021 TTP: medial joint line, pt reporting lateral numbness ?           ?EDEMA: Rt: 43 centimeters, Left 38.5 centimeters ?  ?LE ROM: ?  ?Active ROM Right ?06/24/21 Left ?06/24/21 Right ?06/28/21 Right ?07/01/21 Right ?07/03/21 Rt ?07/10/21 Rt ?07/12/21 Rt ?07/15/21 Rt ?07/17/21 Rt ?07/24/21 Rt ?08/05/21  ?Knee flexion 90 112 98 101* ?supine 103* ?supine 104  ?supine 109 PROM supine 105  ?supine ? 108 ?supine 108 ?supine 112 supine  ?Knee extension -5 -2 -2 -2* ?supine -3* ?Seated  LAQ -2 ?supine -1 ?supine -1 -2 -2 -1  ?(Blank rows = not tested) ?  ?Passive ROM Rt ?06/24/21 Lt ?06/24/21 Rt ?06/28/21 Right ?07/03/21 Rt ?07/17/21  ?Knee flexion 95 115 104 115* ?seated 115 ?supine  ?Knee extension -3 -2 -1 -1*   ?  ?  ?LE MMT: ?  ?MMT Right ?06/24/21 Left ?06/24/21 Right ?08/01/2021  ?Hip flexion 5/5 5/5   ?Hip extension       ?Hip abduction       ?Hip adduction 5/5 5/5   ?Hip internal rotation       ?Hip external rotation       ?Knee flexion 4-/5 5/5   ?Knee extension 3+/5 5/5 4/5  ?Ankle dorsiflexion       ?Ankle plantarflexion       ?Ankle inversion       ?Ankle eversion       ?(Blank rows = not tested) ?  ?FUNCTIONAL TESTS:  ?06/24/2021: 5 times sit to stand: 14 seconds with UE support with walker in front if needed.  ?  ?GAIT: ?06/24/2021  Distance  walked: amb with rolling walker with mild antalgic gait with decreased terminal knee extension on Rt LE ?Assistive device utilized: Environmental consultant - 2 wheeled ?Level of assistance: Modified independence ?Comments: see above ? ?TODAY'S TREATMENT: ?5/12/203 ?Recumbent Bike Seat 6 for 8 minutes ?Seated straight leg raises 3 sets of 5 with 1# ?Sit to stand slow eccentrics 10X hands PRN to help L knee ? ?Therapeutic Activities: ?Leg Press single leg 37# L leg and X# R leg 50# 10X each slow eccentrics 2 sets of 10 each ? ?Neuromuscular re-education: ?Tandem balance 6X 20 seconds each ? ?Vasopneumatic R knee Medium 10 minutes 34* ? ? ?08/05/2021 ?Sci fit bike   ?gastroc stretch 30 sec X 3 on  slantboard ?Hamstring stretch 3 X20 sec ?Quad stretch 3X30 seconds supine with leg off EOB ?Step ups with Rt leg fwd up with right and down in front with left X 10 on 6 inch step ?Sit to stands no UE support 2X10 ?Seated LAQ Rt leg 5# 3 X 10 with DF/PF nerve glide ?Manual ?IASTM to Rt posterior knee, hamstring and gastrocs using biofreeze ?PROM Rt knee flexion and extension ?Rt leg manual quad stretching 30 sec X3 ? ? ?08/01/2021 ?Therex: ? UBE Lvl 2.0 8 mins  ? Runner stretch incline board 30 sec x 3 c cues to press heel into floor ?Leg press Rt leg only 43 lbs 2 x 15 ? Machine knee extension double leg up, Rt leg eccentric lowering 2 x 10 (slot 3 from back) ? Machine knee flexion Rt leg only 2 x 10 15 lbs (slot 3 from back) ? Seated Rt leg quad set 5 sec hold x 10  ? Seated SLR Rt x 15 c slow focus and pause in lift ? ?Vasopneumatic ? Supine Rt knee 34 degrees medium compression 10 mins ? ? ?  ?PATIENT EDUCATION:  ?Education details: PT POC, HEP ?Person educated: Patient ?Education method: Explanation, Demonstration, Tactile cues, Verbal cues, and Handouts ?Education comprehension: verbalized understanding and returned demonstration ?  ?  ?HOME EXERCISE PROGRAM: ?Access Code: [email or web address] ?URL: [email or web address] ?Date:  08/05/2021 ?Prepared by: Elsie Ra ? ?Exercises ?- Seated Knee Flexion Stretch  - 2 x daily - 6 x weekly - 1-2 sets - 10 reps - 5 sec hold ?- Supine Quadriceps Stretch with Strap on Table  - 1-2 x daily - 6 x weekly - 1 sets - 2-3 reps -  30 hold ?- Seated Hamstring Stretch  - 1-2 x daily - 6 x weekly - 1 sets - 3 reps - 30 hold ?- Sit to Stand  - 2 x daily - 6 x weekly - 1-2 sets - 10 reps ?- Recumbent Bike  - 1 x daily - 3 x weekly - 10-15 min hold ?- Single Leg Knee Extension with Weight Machine  - 1 x daily - 2-3 x weekly - 2-3 sets - 10 reps ?- Hamstring Curl with Weight Machine  - 1 x daily - 2-3 x weekly - 2-3 sets - 10 reps ?- Hip Abduction Machine  - 1 x daily - 2-3 x weekly - 2-3 sets - 10 reps ?  ?ASSESSMENT: ?  ?CLINICAL IMPRESSION: ?[name] notes progress with her post-surgical R knee.  Her L knee may be suffering some overuse and she will look into getting gel shots for her L knee.  In the meantime, focus remains B quadriceps strength work, balance (a remaining concern of Kelly's) and functional progressions as the L knee allows. ? ?OBJECTIVE IMPAIRMENTS Abnormal gait, decreased activity tolerance, decreased balance, decreased mobility, difficulty walking, decreased ROM, decreased strength, increased edema, impaired flexibility, postural dysfunction, and pain.  ?  ?ACTIVITY LIMITATIONS cleaning and community activity.  ?  ?PERSONAL FACTORS 3+ comorbidities: DM, hypothyroidism, HA  are also affecting patient's functional outcome.  ?  ?REHAB POTENTIAL: Good ?  ?CLINICAL DECISION MAKING: Stable/uncomplicated ?  ?EVALUATION COMPLEXITY: Low ?  ?  ?GOALS: ?Goals reviewed with patient? Yes ?Short term PT Goals (target date for Short term goals are 4 weeks 07/26/2021) ?Patient will demonstrate independent use of home exercise program to maintain progress from in clinic treatments. ?Goal status: MET 07/24/2021 ?  ?Long term PT goals (target dates for all long term goals are 8 weeks  08/23/2021 ) ?Patient will  demonstrate/report pain at worst less than or equal to 2/10 to facilitate minimal limitation in daily activity secondary to pain symptoms. ?Goal status: on-going 08/09/2021 ?  ?Patient will demonstrate independent use o

## 2021-08-12 ENCOUNTER — Ambulatory Visit (INDEPENDENT_AMBULATORY_CARE_PROVIDER_SITE_OTHER): Payer: Medicare Other | Admitting: Physical Therapy

## 2021-08-12 ENCOUNTER — Encounter: Payer: Self-pay | Admitting: Physical Therapy

## 2021-08-12 DIAGNOSIS — M25661 Stiffness of right knee, not elsewhere classified: Secondary | ICD-10-CM

## 2021-08-12 DIAGNOSIS — R600 Localized edema: Secondary | ICD-10-CM

## 2021-08-12 DIAGNOSIS — M25561 Pain in right knee: Secondary | ICD-10-CM | POA: Diagnosis not present

## 2021-08-12 DIAGNOSIS — G8929 Other chronic pain: Secondary | ICD-10-CM

## 2021-08-12 DIAGNOSIS — R2689 Other abnormalities of gait and mobility: Secondary | ICD-10-CM | POA: Diagnosis not present

## 2021-08-12 DIAGNOSIS — M6281 Muscle weakness (generalized): Secondary | ICD-10-CM

## 2021-08-12 NOTE — Therapy (Signed)
?OUTPATIENT PHYSICAL THERAPY TREATMENT NOTE ? ? ? ?Patient Name: Krystal Lang ?MRN: [id number] ?DOB:03/31/1955, 66 y.o., female ?Today's Date: 08/12/2021 ? ?PCP: Villarruel, Ajay, MD ?REFERRING PROVIDER: Michael Robert Ruffolo MD ? ? PT End of Session - 08/12/21 0942   ? ? Visit Number 19   ? Number of Visits 24   ? Date for PT Re-Evaluation 08/23/21   ? Authorization Type Medicare, KX modifier starting at visit 11 (pt had 4 pre-op visits)   ? Progress Note Due on Visit 20   ? PT Start Time 0934   ? PT Stop Time 1015   ? PT Time Calculation (min) 41 min   ? Activity Tolerance Patient tolerated treatment well;No increased pain   ? Behavior During Therapy WFL for tasks assessed/performed   ? ?  ?  ? ?  ? ? ?Past Medical History:  ?Diagnosis Date  ? Cataract   ? Mixed form OU  ? Diabetes mellitus   ? Fibroid uterus 2001  ? H/O fatigue 1998  ? Headache(784.0)   ? Hirsutism 1998  ? Idiopathic  ? History of chicken pox   ? Hypothyroidism   ? Menses, irregular 1998  ? ?Past Surgical History:  ?Procedure Laterality Date  ? DILATION AND CURETTAGE OF UTERUS  2002  ? In [place]  ? KNEE SURGERY    ? TONSILECTOMY, ADENOIDECTOMY, BILATERAL MYRINGOTOMY AND TUBES    ? TONSILLECTOMY    ? ?Patient Active Problem List  ? Diagnosis Date Noted  ? Bilateral primary osteoarthritis of knee 05/11/2017  ? Dyspareunia, female 11/13/2011  ? Type 2 diabetes mellitus (HCC) 11/13/2011  ? Alopecia 11/13/2011  ? History of chicken pox   ? Headache(784.0)   ? Hypothyroidism   ? Menses, irregular   ? Hirsutism   ? H/O fatigue   ? Fibroid uterus   ? ? ?REFERRING DIAG: Z96.659 (ICD-10-CM) - S/P total knee arthroplasty 06/05/2021 ? ?THERAPY DIAG:  ?Other abnormalities of gait and mobility ? ?Chronic pain of right knee ? ?Stiffness of right knee, not elsewhere classified ? ?Localized edema ? ?Muscle weakness (generalized) ? ?PERTINENT HISTORY: RT TKA on 06/05/2021 ? ?PRECAUTIONS: None ? ?SUBJECTIVE: Kelly notes she feels her Rt knee has lost some motion, she is  intrested in DN to her posterior leg due to continued pain, her biggest concern is now her left knee.  ? ?PAIN:  ?Are you having pain?  Yes: NPRS scale: 2-5/10 ?Pain location: B knees (L > R today) ?Pain description: intermittent heavy, tightness, sometimes sharp ?Aggravating factors: change position sleeping  ?Relieving factors: ice, meds Rx ? ?OBJECTIVE:  ?  ?DIAGNOSTIC FINDINGS: prior to surgery: significant tricompartmental arthritis predominantly in the medial compartment where there are areas of complete cartilage loss.  There is also a radial tear of the medial meniscus posteriorly. ?  ?PATIENT SURVEYS:  ?08/09/2021: FOTO  ?06/24/2021: FOTO 47% (predicted 62%) ?  ?POSTURE:  Rounded shoulders ?  ?PALPATION: ?06/24/2021 TTP: medial joint line, pt reporting lateral numbness ?           ?EDEMA: Rt: 43 centimeters, Left 38.5 centimeters ?  ?LE ROM: ?  ?Active ROM Right ?06/24/21 Left ?06/24/21 Right ?06/28/21 Right ?07/01/21 Right ?07/03/21 Rt ?07/10/21 Rt ?07/12/21 Rt ?07/15/21 Rt ?07/17/21 Rt ?07/24/21 Rt ?08/05/21 Rt  ?Knee flexion 90 112 98 101* ?supine 103* ?supine 104  ?supine 109 PROM supine 105  ?supine ? 108 ?supine 108 ?supine 112 supine A/P ?108/111  ?Knee extension -5 -2 -2 -2* ?supine -3* ?Seated   LAQ -2 ?supine -1 ?supine -1 -2 -2 -1 0  ?(Blank rows = not tested) ?  ?Passive ROM Rt ?06/24/21 Lt ?06/24/21 Rt ?06/28/21 Right ?07/03/21 Rt ?07/17/21  ?Knee flexion 95 115 104 115* ?seated 115 ?supine  ?Knee extension -3 -2 -1 -1*   ?  ?  ?LE MMT: ?  ?MMT Right ?06/24/21 Left ?06/24/21 Right ?08/01/2021  ?Hip flexion 5/5 5/5   ?Hip extension       ?Hip abduction       ?Hip adduction 5/5 5/5   ?Hip internal rotation       ?Hip external rotation       ?Knee flexion 4-/5 5/5   ?Knee extension 3+/5 5/5 4/5  ?Ankle dorsiflexion       ?Ankle plantarflexion       ?Ankle inversion       ?Ankle eversion       ?(Blank rows = not tested) ?  ?FUNCTIONAL TESTS:  ?06/24/2021: 5 times sit to stand: 14 seconds with UE support with walker in front if  needed.  ?  ?GAIT: ?06/24/2021  Distance walked: amb with rolling walker with mild antalgic gait with decreased terminal knee extension on Rt LE ?Assistive device utilized: Environmental consultant - 2 wheeled ?Level of assistance: Modified independence ?Comments: see above ? ?TODAY'S TREATMENT: ?08/12/21 ?Sci fit bike L 3 X 10 min seat#9 ?Seated straight leg raises 2 sets of 5 with 3# ?Sit to stand no UE support X 10 slow eccentrics ? ?Manual therapy: Rt knee PROM with flexion mobs, Rt hamstring manual stretching 1 min X 3, skilled palpation with active compression with DN to her Rt lateral hamstring and gastroc. Verbal consent given and education handout provided for this. Twitch response elicited and good overall tolerance to this intervention today. ? ?Vasopnuematic X 10 min medium compression and 34 deg to Rt knee for edema and pain control ? ? ? ? ?5/12/203 ?Recumbent Bike Seat 6 for 8 minutes ?Seated straight leg raises 3 sets of 5 with 1# ?Sit to stand slow eccentrics 10X hands PRN to help L knee ? ?Therapeutic Activities: ?Leg Press single leg 37# L leg and X# R leg 50# 10X each slow eccentrics 2 sets of 10 each ? ?Neuromuscular re-education: ?Tandem balance 6X 20 seconds each ? ?Vasopneumatic R knee Medium 10 minutes 34* ? ?  ?PATIENT EDUCATION:  ?Education details: PT POC, HEP ?Person educated: Patient ?Education method: Explanation, Demonstration, Tactile cues, Verbal cues, and Handouts ?Education comprehension: verbalized understanding and returned demonstration ?  ?  ?HOME EXERCISE PROGRAM: ?Access Code: [email or web address] ?URL: [email or web address] ?Date: 08/05/2021 ?Prepared by: Elsie Ra ? ?Exercises ?- Seated Knee Flexion Stretch  - 2 x daily - 6 x weekly - 1-2 sets - 10 reps - 5 sec hold ?- Supine Quadriceps Stretch with Strap on Table  - 1-2 x daily - 6 x weekly - 1 sets - 2-3 reps - 30 hold ?- Seated Hamstring Stretch  - 1-2 x daily - 6 x weekly - 1 sets - 3 reps - 30 hold ?- Sit to Stand  - 2 x daily - 6 x  weekly - 1-2 sets - 10 reps ?- Recumbent Bike  - 1 x daily - 3 x weekly - 10-15 min hold ?- Single Leg Knee Extension with Weight Machine  - 1 x daily - 2-3 x weekly - 2-3 sets - 10 reps ?- Hamstring Curl with Weight Machine  - 1 x daily - 2-3 x weekly - 2-3 sets -  10 reps ?- Hip Abduction Machine  - 1 x daily - 2-3 x weekly - 2-3 sets - 10 reps ?  ?ASSESSMENT: ?  ?CLINICAL IMPRESSION: ?I feel her Rt knee is doing well post op, but still with some posterior knee pain and she was intrested in DN. I did try this today to her Rt hamstrings and gastoc and we will see how she responds to this next visit. Now her biggest concern is left knee which we are working on strengthening for this as well.  ? ?OBJECTIVE IMPAIRMENTS Abnormal gait, decreased activity tolerance, decreased balance, decreased mobility, difficulty walking, decreased ROM, decreased strength, increased edema, impaired flexibility, postural dysfunction, and pain.  ?  ?ACTIVITY LIMITATIONS cleaning and community activity.  ?  ?PERSONAL FACTORS 3+ comorbidities: DM, hypothyroidism, HA  are also affecting patient's functional outcome.  ?  ?REHAB POTENTIAL: Good ?  ?CLINICAL DECISION MAKING: Stable/uncomplicated ?  ?EVALUATION COMPLEXITY: Low ?  ?  ?GOALS: ?Goals reviewed with patient? Yes ?Short term PT Goals (target date for Short term goals are 4 weeks 07/26/2021) ?Patient will demonstrate independent use of home exercise program to maintain progress from in clinic treatments. ?Goal status: MET 07/24/2021 ?  ?Long term PT goals (target dates for all long term goals are 8 weeks  08/23/2021 ) ?Patient will demonstrate/report pain at worst less than or equal to 2/10 to facilitate minimal limitation in daily activity secondary to pain symptoms. ?Goal status: on-going 08/09/2021 ?  ?Patient will demonstrate independent use of home exercise program to facilitate ability to maintain/progress functional gains from skilled physical therapy services. ?Goal status:  on-going 08/09/2021 ?  ?Patient will demonstrate FOTO outcome > or = 62 % to indicate reduced disability due to condition. ?Goal status: On Going 08/09/2021 ? ?MET on 07/15/2021: 62% ? ?Pt will be able to amb

## 2021-08-14 ENCOUNTER — Encounter: Payer: Medicare Other | Admitting: Physical Therapy

## 2021-08-16 ENCOUNTER — Encounter: Payer: Medicare Other | Admitting: Physical Therapy

## 2021-08-19 ENCOUNTER — Ambulatory Visit (INDEPENDENT_AMBULATORY_CARE_PROVIDER_SITE_OTHER): Payer: Medicare Other | Admitting: Physical Therapy

## 2021-08-19 ENCOUNTER — Encounter: Payer: Self-pay | Admitting: Physical Therapy

## 2021-08-19 DIAGNOSIS — M25561 Pain in right knee: Secondary | ICD-10-CM | POA: Diagnosis not present

## 2021-08-19 DIAGNOSIS — R2689 Other abnormalities of gait and mobility: Secondary | ICD-10-CM

## 2021-08-19 DIAGNOSIS — M6281 Muscle weakness (generalized): Secondary | ICD-10-CM

## 2021-08-19 DIAGNOSIS — R600 Localized edema: Secondary | ICD-10-CM | POA: Diagnosis not present

## 2021-08-19 DIAGNOSIS — G8929 Other chronic pain: Secondary | ICD-10-CM

## 2021-08-19 DIAGNOSIS — M25661 Stiffness of right knee, not elsewhere classified: Secondary | ICD-10-CM | POA: Diagnosis not present

## 2021-08-19 NOTE — Therapy (Signed)
OUTPATIENT PHYSICAL THERAPY TREATMENT NOTE/Progress note Progress Note reporting period date 07/15/21 to 08/19/21  See below for objective and subjective measurements relating to patients progress with PT.    Patient Name: Krystal Lang MRN: [id number] DOB:[birth year], 66 y.o., female Today's Date: 08/19/2021  PCP: Krystal Dials, MD REFERRING PROVIDER: Lynnell Jude MD   PT End of Session - 08/19/21 0945     Visit Number 20    Number of Visits 24    Date for PT Re-Evaluation 08/23/21    Authorization Type Medicare, KX modifier starting at visit 11 (pt had 4 pre-op visits)    Progress Note Due on Visit 30    PT Start Time 0930    PT Stop Time 1020    PT Time Calculation (min) 50 min    Activity Tolerance Patient tolerated treatment well;No increased pain    Behavior During Therapy WFL for tasks assessed/performed             Past Medical History:  Diagnosis Date   Cataract    Mixed form OU   Diabetes mellitus    Fibroid uterus 2001   H/O fatigue 1998   Headache(784.0)    Hirsutism 1998   Idiopathic   History of chicken pox    Hypothyroidism    Menses, irregular 1998   Past Surgical History:  Procedure Laterality Date   DILATION AND CURETTAGE OF UTERUS  2002   In [place]   KNEE SURGERY     TONSILECTOMY, ADENOIDECTOMY, BILATERAL MYRINGOTOMY AND TUBES     TONSILLECTOMY     Patient Active Problem List   Diagnosis Date Noted   Bilateral primary osteoarthritis of knee 05/11/2017   Dyspareunia, female 11/13/2011   Type 2 diabetes mellitus ([place]) 11/13/2011   Alopecia 11/13/2011   History of chicken pox    Headache(784.0)    Hypothyroidism    Menses, irregular    Hirsutism    H/O fatigue    Fibroid uterus     REFERRING DIAG: Z96.659 (ICD-10-CM) - S/P total knee arthroplasty 06/05/2021  THERAPY DIAG:  Chronic pain of right knee  Other abnormalities of gait and mobility  Stiffness of right knee, not elsewhere classified  Localized  edema  Muscle weakness (generalized)  PERTINENT HISTORY: RT TKA on 06/05/2021  PRECAUTIONS: None  SUBJECTIVE: Krystal Lang has a lot of concern about her left knee now as it is getting worse and does not allow her to do her ADL's or walk. Her Right knee is not feeling too bad, 2/10 pain upon arrival  PAIN:  Are you having pain?  Yes: NPRS scale: 2/10 Pain location: B knees (L > R today) Pain description: intermittent heavy, tightness, sometimes sharp Aggravating factors: change position sleeping  Relieving factors: ice, meds Rx  OBJECTIVE:    DIAGNOSTIC FINDINGS: prior to surgery: significant tricompartmental arthritis predominantly in the medial compartment where there are areas of complete cartilage loss.  There is also a radial tear of the medial meniscus posteriorly.   PATIENT SURVEYS:  08/09/2021: FOTO  06/24/2021: FOTO 47% (predicted 62%)   POSTURE:  Rounded shoulders   PALPATION: 06/24/2021 TTP: medial joint line, pt reporting lateral numbness            EDEMA: Rt: 43 centimeters, Left 38.5 centimeters   LE ROM:   Active ROM Right 06/24/21 Left 06/24/21 Right 06/28/21 Right 07/01/21 Right 07/03/21 Rt 07/10/21 Rt 07/12/21 Rt 07/15/21 Rt 07/17/21 Rt 07/24/21 Rt 08/05/21 Rt 08/19/21  Knee flexion 90 112 98 101* supine  103* supine 104  supine 109 PROM supine 105  supine  108 supine 108 supine 112 supine A/P 108/111  Knee extension -5 -2 -2 -2* supine -3* Seated LAQ -2 supine -1 supine -1 -2 -2 -1 0  (Blank rows = not tested)   Passive ROM Rt 06/24/21 Lt 06/24/21 Rt 06/28/21 Right 07/03/21 Rt 07/17/21  Knee flexion 95 115 104 115* seated 115 supine  Knee extension -3 -2 -1 -1*       LE MMT:   MMT Right 06/24/21 Left 06/24/21 Right 08/01/2021 Right 08/19/21  Hip flexion 5/5 5/5  5  Hip extension        Hip abduction        Hip adduction 5/5 5/5  5  Hip internal rotation        Hip external rotation        Knee flexion 4-/5 5/5  5  Knee extension 3+/5 5/5 4/5 5   Ankle dorsiflexion        Ankle plantarflexion        Ankle inversion        Ankle eversion        (Blank rows = not tested)   FUNCTIONAL TESTS:  06/24/2021: 5 times sit to stand: 14 seconds with UE support with walker in front if needed.    GAIT: 06/24/2021  Distance walked: amb with rolling walker with mild antalgic gait with decreased terminal knee extension on Rt LE Assistive device utilized: Walker - 2 wheeled Level of assistance: Modified independence Comments: see above  TODAY'S TREATMENT: 08/19/21 Sci fit bike L3 X 8 min seat#10 Seated straight leg raises 2 sets of 10 with 2# Leg extension machine eccentrics for Rt side 5# 2X10 Seated hamstring curl green on Rt 2X10 Seated hamstring stretch 3 X 30 sec on Rt  Manual therapy: Rt knee PROM with flexion mobs  Modalities: Vasopnuematic X 10 min medium compression and 34 deg to Rt knee for edema and pain control TENS IFC X 10 min to Lt knee for pain control    08/12/21 Sci fit bike L 3 X 10 min seat#9 Seated straight leg raises 2 sets of 5 with 3# Sit to stand no UE support X 10 slow eccentrics  Manual therapy: Rt knee PROM with flexion mobs, Rt hamstring manual stretching 1 min X 3, skilled palpation with active compression with DN to her Rt lateral hamstring and gastroc. Verbal consent given and education handout provided for this. Twitch response elicited and good overall tolerance to this intervention today.  Vasopnuematic X 10 min medium compression and 34 deg to Rt knee for edema and pain control     5/12/203 Recumbent Bike Seat 6 for 8 minutes Seated straight leg raises 3 sets of 5 with 1# Sit to stand slow eccentrics 10X hands PRN to help L knee  Therapeutic Activities: Leg Press single leg 37# L leg and X# R leg 50# 10X each slow eccentrics 2 sets of 10 each  Neuromuscular re-education: Tandem balance 6X 20 seconds each  Vasopneumatic R knee Medium 10 minutes 34*    PATIENT EDUCATION:  Education  details: PT POC, HEP Person educated: Patient Education method: Explanation, Demonstration, Tactile cues, Verbal cues, and Handouts Education comprehension: verbalized understanding and returned demonstration     HOME EXERCISE PROGRAM: Access Code: [email or web address] URL: [email or web address] Date: 08/05/2021 Prepared by: Elsie Ra  Exercises - Seated Knee Flexion Stretch  - 2 x daily - 6 x weekly - 1-2 sets - 10  reps - 5 sec hold - Supine Quadriceps Stretch with Strap on Table  - 1-2 x daily - 6 x weekly - 1 sets - 2-3 reps - 30 hold - Seated Hamstring Stretch  - 1-2 x daily - 6 x weekly - 1 sets - 3 reps - 30 hold - Sit to Stand  - 2 x daily - 6 x weekly - 1-2 sets - 10 reps - Recumbent Bike  - 1 x daily - 3 x weekly - 10-15 min hold - Single Leg Knee Extension with Weight Machine  - 1 x daily - 2-3 x weekly - 2-3 sets - 10 reps - Hamstring Curl with Weight Machine  - 1 x daily - 2-3 x weekly - 2-3 sets - 10 reps - Hip Abduction Machine  - 1 x daily - 2-3 x weekly - 2-3 sets - 10 reps   ASSESSMENT:   CLINICAL IMPRESSION: Her posterior Rt knee pain appeared to improve from DN treatment las time, she did not feel she needed this intervention today. Her Rt knee has made good overall progress post op TKA however her functional level for standing and walking remains limited mostly by Lt knee pain now. I did try TENS unit today on left knee to see if this provides her any additional pain control.   OBJECTIVE IMPAIRMENTS Abnormal gait, decreased activity tolerance, decreased balance, decreased mobility, difficulty walking, decreased ROM, decreased strength, increased edema, impaired flexibility, postural dysfunction, and pain.    ACTIVITY LIMITATIONS cleaning and community activity.    PERSONAL FACTORS 3+ comorbidities: DM, hypothyroidism, HA  are also affecting patient's functional outcome.    REHAB POTENTIAL: Good   CLINICAL DECISION MAKING: Stable/uncomplicated   EVALUATION  COMPLEXITY: Low     GOALS: Goals reviewed with patient? Yes Short term PT Goals (target date for Short term goals are 4 weeks 07/26/2021) Patient will demonstrate independent use of home exercise program to maintain progress from in clinic treatments. Goal status: MET 07/24/2021   Long term PT goals (target dates for all long term goals are 8 weeks  08/23/2021 ) Patient will demonstrate/report pain at worst less than or equal to 2/10 to facilitate minimal limitation in daily activity secondary to pain symptoms. Goal status: on-going 08/09/2021   Patient will demonstrate independent use of home exercise program to facilitate ability to maintain/progress functional gains from skilled physical therapy services. Goal status: on-going 08/09/2021   Patient will demonstrate FOTO outcome > or = 62 % to indicate reduced disability due to condition. Goal status: On Going 08/09/2021  MET on 07/15/2021: 62%  Pt will be able to amb with no device with normalized gait pattern on community surfaces.  Goal status: On-going 08/09/2021       5.  Pt will improve Rt knee flexion to >/= 120 degrees to improve functional mobility and gait.  Goal status: ongoing 08/09/2021     PLAN: PT FREQUENCY:2-3/week   PT DURATION: 8 weeks   PLANNED INTERVENTIONS: Therapeutic exercises, Therapeutic activity, Neuro Muscular re-education, Balance training, Gait training, Patient/Family education, Joint mobilization, Stair training, DME instructions, Dry Needling, Electrical stimulation, Cryotherapy, Moist heat, Taping, Ultrasound, Ionotophoresis 4mg /ml Dexamethasone, and Manual therapy.  All included unless contraindicated   PLAN FOR NEXT SESSION: how was TENS unit to Lt knee? continue to work on bilat quad strength as able.  Debbe Odea, PT, DPT 08/19/2021, 9:53 AM

## 2021-08-21 ENCOUNTER — Encounter: Payer: Medicare Other | Admitting: Physical Therapy

## 2021-08-22 ENCOUNTER — Ambulatory Visit (INDEPENDENT_AMBULATORY_CARE_PROVIDER_SITE_OTHER): Payer: Medicare Other | Admitting: Rehabilitative and Restorative Service Providers"

## 2021-08-22 ENCOUNTER — Encounter: Payer: Self-pay | Admitting: Rehabilitative and Restorative Service Providers"

## 2021-08-22 DIAGNOSIS — M25561 Pain in right knee: Secondary | ICD-10-CM

## 2021-08-22 DIAGNOSIS — G8929 Other chronic pain: Secondary | ICD-10-CM

## 2021-08-22 DIAGNOSIS — R600 Localized edema: Secondary | ICD-10-CM

## 2021-08-22 DIAGNOSIS — M6281 Muscle weakness (generalized): Secondary | ICD-10-CM | POA: Diagnosis not present

## 2021-08-22 DIAGNOSIS — R2689 Other abnormalities of gait and mobility: Secondary | ICD-10-CM | POA: Diagnosis not present

## 2021-08-22 DIAGNOSIS — M25661 Stiffness of right knee, not elsewhere classified: Secondary | ICD-10-CM | POA: Diagnosis not present

## 2021-08-22 NOTE — Therapy (Signed)
OUTPATIENT PHYSICAL THERAPY TREATMENT NOTE   Patient Name: Krystal Lang MRN: [id number] DOB:[birth year], 66 y.o., female Today's Date: 08/22/2021  PCP: Dixie Dials, MD REFERRING PROVIDER: Lynnell Jude MD   PT End of Session - 08/22/21 1105     Visit Number 21    Number of Visits 24    Date for PT Re-Evaluation 08/23/21    Authorization Type Medicare, KX modifier starting at visit 11 (pt had 4 pre-op visits)    Progress Note Due on Visit 30    PT Start Time 1100    PT Stop Time 1151    PT Time Calculation (min) 51 min    Activity Tolerance Patient tolerated treatment well;No increased pain    Behavior During Therapy WFL for tasks assessed/performed              Past Medical History:  Diagnosis Date   Cataract    Mixed form OU   Diabetes mellitus    Fibroid uterus 2001   H/O fatigue 1998   Headache(784.0)    Hirsutism 1998   Idiopathic   History of chicken pox    Hypothyroidism    Menses, irregular 1998   Past Surgical History:  Procedure Laterality Date   DILATION AND CURETTAGE OF UTERUS  2002   In [place]   KNEE SURGERY     TONSILECTOMY, ADENOIDECTOMY, BILATERAL MYRINGOTOMY AND TUBES     TONSILLECTOMY     Patient Active Problem List   Diagnosis Date Noted   Bilateral primary osteoarthritis of knee 05/11/2017   Dyspareunia, female 11/13/2011   Type 2 diabetes mellitus ([place]) 11/13/2011   Alopecia 11/13/2011   History of chicken pox    Headache(784.0)    Hypothyroidism    Menses, irregular    Hirsutism    H/O fatigue    Fibroid uterus     REFERRING DIAG: Z96.659 (ICD-10-CM) - S/P total knee arthroplasty 06/05/2021  THERAPY DIAG:  Muscle weakness (generalized)  Stiffness of right knee, not elsewhere classified  Chronic pain of right knee  Other abnormalities of gait and mobility  Localized edema  PERTINENT HISTORY: RT TKA on 06/05/2021  PRECAUTIONS: None  SUBJECTIVE: Krystal Lang continues to be concerned about her L > R knee pain.   She has been very active at home.  PAIN:  Are you having pain?  Yes: NPRS scale: 2-5/10 this week Pain location: B knees (L > R today) Pain description: intermittent heavy, tightness, sometimes sharp Aggravating factors: change position sleeping  Relieving factors: ice, meds Rx  OBJECTIVE:    DIAGNOSTIC FINDINGS: prior to surgery: significant tricompartmental arthritis predominantly in the medial compartment where there are areas of complete cartilage loss.  There is also a radial tear of the medial meniscus posteriorly.   PATIENT SURVEYS:  08/09/2021: FOTO  06/24/2021: FOTO 47% (predicted 62%)   POSTURE:  Rounded shoulders   PALPATION: 06/24/2021 TTP: medial joint line, pt reporting lateral numbness            EDEMA: Rt: 43 centimeters, Left 38.5 centimeters   LE ROM:   Active ROM Right 06/24/21 Left 06/24/21 Right 06/28/21 Right 07/01/21 Right 07/03/21 Rt 07/10/21 Rt 07/12/21 Rt 07/15/21 Rt 07/17/21 Rt 07/24/21 Rt 08/05/21 Rt 08/19/21  Knee flexion 90 112 98 101* supine 103* supine 104  supine 109 PROM supine 105  supine  108 supine 108 supine 112 supine A/P 108/111  Knee extension -5 -2 -2 -2* supine -3* Seated LAQ -2 supine -1 supine -1 -2 -2 -1 0  (  Blank rows = not tested)   Passive ROM Rt 06/24/21 Lt 06/24/21 Rt 06/28/21 Right 07/03/21 Rt 07/17/21  Knee flexion 95 115 104 115* seated 115 supine  Knee extension -3 -2 -1 -1*       LE MMT:   MMT Right 06/24/21 Left 06/24/21 Right 08/01/2021 Right 08/19/21  Hip flexion 5/5 5/5  5  Hip extension        Hip abduction        Hip adduction 5/5 5/5  5  Hip internal rotation        Hip external rotation        Knee flexion 4-/5 5/5  5  Knee extension 3+/5 5/5 4/5 5  Ankle dorsiflexion        Ankle plantarflexion        Ankle inversion        Ankle eversion        (Blank rows = not tested)   FUNCTIONAL TESTS:  06/24/2021: 5 times sit to stand: 14 seconds with UE support with walker in front if needed.     GAIT: 06/24/2021  Distance walked: amb with rolling walker with mild antalgic gait with decreased terminal knee extension on Rt LE Assistive device utilized: Walker - 2 wheeled Level of assistance: Modified independence Comments: see above  TODAY'S TREATMENT: 08/22/2021: Sci fit bike Level 3 for 5 minutes  Seated straight leg raises 5 sets of 5 with 1# (lots of correction and feedback needed)  Neuromuscular re-education: Tandem balance 6X 20 seconds each  Vasopneumatic R knee Medium 10 minutes 34* TENS pre-mod X 10 min to Lt knee for pain control   08/19/21 Sci fit bike L3 X 8 min seat#10 Seated straight leg raises 2 sets of 10 with 2# Leg extension machine eccentrics for Rt side 5# 2X10 Seated hamstring curl green on Rt 2X10 Seated hamstring stretch 3 X 30 sec on Rt  Manual therapy: Rt knee PROM with flexion mobs  Modalities: Vasopnuematic X 10 min medium compression and 34 deg to Rt knee for edema and pain control TENS IFC X 10 min to Lt knee for pain control   08/12/21 Sci fit bike L 3 X 10 min seat#9 Seated straight leg raises 2 sets of 5 with 3# Sit to stand no UE support X 10 slow eccentrics  Manual therapy: Rt knee PROM with flexion mobs, Rt hamstring manual stretching 1 min X 3, skilled palpation with active compression with DN to her Rt lateral hamstring and gastroc. Verbal consent given and education handout provided for this. Twitch response elicited and good overall tolerance to this intervention today.  Vasopnuematic X 10 min medium compression and 34 deg to Rt knee for edema and pain control    PATIENT EDUCATION:  Education details: PT POC, HEP Person educated: Patient Education method: Explanation, Demonstration, Tactile cues, Verbal cues, and Handouts Education comprehension: verbalized understanding and returned demonstration     HOME EXERCISE PROGRAM: Access Code: [email or web address] URL: [email or web address] Date: 08/05/2021 Prepared by:  Elsie Ra  Exercises - Seated Knee Flexion Stretch  - 2 x daily - 6 x weekly - 1-2 sets - 10 reps - 5 sec hold - Supine Quadriceps Stretch with Strap on Table  - 1-2 x daily - 6 x weekly - 1 sets - 2-3 reps - 30 hold - Seated Hamstring Stretch  - 1-2 x daily - 6 x weekly - 1 sets - 3 reps - 30 hold - Sit to Stand  - 2 x  daily - 6 x weekly - 1-2 sets - 10 reps - Recumbent Bike  - 1 x daily - 3 x weekly - 10-15 min hold - Single Leg Knee Extension with Weight Machine  - 1 x daily - 2-3 x weekly - 2-3 sets - 10 reps - Hamstring Curl with Weight Machine  - 1 x daily - 2-3 x weekly - 2-3 sets - 10 reps - Hip Abduction Machine  - 1 x daily - 2-3 x weekly - 2-3 sets - 10 reps   ASSESSMENT:   CLINICAL IMPRESSION: Krystal Lang reports her B knees have been bothering her the past few days.  We discussed the importance of avoiding overuse, using pain meds and ice as needed and correct technique with the NWB quadriceps exercises.  Her prognosis remains good with avoiding overuse and backing off activities a bit over the weekend.  OBJECTIVE IMPAIRMENTS Abnormal gait, decreased activity tolerance, decreased balance, decreased mobility, difficulty walking, decreased ROM, decreased strength, increased edema, impaired flexibility, postural dysfunction, and pain.    ACTIVITY LIMITATIONS cleaning and community activity.    PERSONAL FACTORS 3+ comorbidities: DM, hypothyroidism, HA  are also affecting patient's functional outcome.    REHAB POTENTIAL: Good   CLINICAL DECISION MAKING: Stable/uncomplicated   EVALUATION COMPLEXITY: Low     GOALS: Goals reviewed with patient? Yes Short term PT Goals (target date for Short term goals are 4 weeks 07/26/2021) Patient will demonstrate independent use of home exercise program to maintain progress from in clinic treatments. Goal status: MET 07/24/2021   Long term PT goals (target dates for all long term goals are 8 weeks  08/23/2021 ) Patient will demonstrate/report  pain at worst less than or equal to 2/10 to facilitate minimal limitation in daily activity secondary to pain symptoms. Goal status: on-going 08/22/2021   Patient will demonstrate independent use of home exercise program to facilitate ability to maintain/progress functional gains from skilled physical therapy services. Goal status: on-going 08/22/2021   Patient will demonstrate FOTO outcome > or = 62 % to indicate reduced disability due to condition. Goal status: On Going 08/22/2021  MET on 07/15/2021: 62%  Pt will be able to amb with no device with normalized gait pattern on community surfaces.  Goal status: On-going 08/22/2021       5.  Pt will improve Rt knee flexion to >/= 120 degrees to improve functional mobility and gait.  Goal status: ongoing 08/22/2021     PLAN: PT FREQUENCY:2-3/week   PT DURATION: 8 weeks   PLANNED INTERVENTIONS: Therapeutic exercises, Therapeutic activity, Neuro Muscular re-education, Balance training, Gait training, Patient/Family education, Joint mobilization, Stair training, DME instructions, Dry Needling, Electrical stimulation, Cryotherapy, Moist heat, Taping, Ultrasound, Ionotophoresis 4mg /ml Dexamethasone, and Manual therapy.  All included unless contraindicated   PLAN FOR NEXT SESSION: Continue TENS and vaso to help with pain.  Encourage her to avoid overuse.  Progress B quadriceps strength as able.  Farley Ly, PT, MPT 08/22/2021, 2:04 PM

## 2021-08-23 ENCOUNTER — Encounter: Payer: Self-pay | Admitting: Physical Therapy

## 2021-08-23 ENCOUNTER — Ambulatory Visit (INDEPENDENT_AMBULATORY_CARE_PROVIDER_SITE_OTHER): Payer: Medicare Other | Admitting: Physical Therapy

## 2021-08-23 DIAGNOSIS — M6281 Muscle weakness (generalized): Secondary | ICD-10-CM

## 2021-08-23 DIAGNOSIS — R2689 Other abnormalities of gait and mobility: Secondary | ICD-10-CM | POA: Diagnosis not present

## 2021-08-23 DIAGNOSIS — R600 Localized edema: Secondary | ICD-10-CM

## 2021-08-23 DIAGNOSIS — G8929 Other chronic pain: Secondary | ICD-10-CM

## 2021-08-23 DIAGNOSIS — M25561 Pain in right knee: Secondary | ICD-10-CM | POA: Diagnosis not present

## 2021-08-23 DIAGNOSIS — M25661 Stiffness of right knee, not elsewhere classified: Secondary | ICD-10-CM

## 2021-08-23 NOTE — Therapy (Addendum)
OUTPATIENT PHYSICAL THERAPY TREATMENT NOTE/Discharge addendum PHYSICAL THERAPY DISCHARGE SUMMARY  Visits from Start of Care: 22  Current functional level related to goals / functional outcomes: See below   Remaining deficits: See below   Education / Equipment: HEP  Plan:  Patient goals were partially met.       Patient Name: Krystal Lang MRN: [id number] DOB:1955/08/27, 66 y.o., female Today's Date: 08/23/2021  PCP: Dixie Dials, MD REFERRING PROVIDER: Lynnell Jude MD   PT End of Session - 08/23/21 6269     Visit Number 22    Number of Visits 24    Date for PT Re-Evaluation 08/23/21    Authorization Type Medicare, KX modifier starting at visit 11 (pt had 4 pre-op visits)    Progress Note Due on Visit 30    PT Start Time 0930    PT Stop Time 1015    PT Time Calculation (min) 45 min    Activity Tolerance Patient tolerated treatment well;No increased pain    Behavior During Therapy WFL for tasks assessed/performed              Past Medical History:  Diagnosis Date   Cataract    Mixed form OU   Diabetes mellitus    Fibroid uterus 2001   H/O fatigue 1998   Headache(784.0)    Hirsutism 1998   Idiopathic   History of chicken pox    Hypothyroidism    Menses, irregular 1998   Past Surgical History:  Procedure Laterality Date   DILATION AND CURETTAGE OF UTERUS  2002   In [place]   KNEE SURGERY     TONSILECTOMY, ADENOIDECTOMY, BILATERAL MYRINGOTOMY AND TUBES     TONSILLECTOMY     Patient Active Problem List   Diagnosis Date Noted   Bilateral primary osteoarthritis of knee 05/11/2017   Dyspareunia, female 11/13/2011   Type 2 diabetes mellitus ([place]) 11/13/2011   Alopecia 11/13/2011   History of chicken pox    Headache(784.0)    Hypothyroidism    Menses, irregular    Hirsutism    H/O fatigue    Fibroid uterus     REFERRING DIAG: Z96.659 (ICD-10-CM) - S/P total knee arthroplasty 06/05/2021  THERAPY DIAG:  Muscle weakness  (generalized)  Stiffness of right knee, not elsewhere classified  Chronic pain of right knee  Other abnormalities of gait and mobility  Localized edema  PERTINENT HISTORY: RT TKA on 06/05/2021  PRECAUTIONS: None  SUBJECTIVE: She relays 3/10 pain in Rt knee and a lot of pain in left knee, has been trying to rest more  PAIN:  Are you having pain?  Yes: NPRS scale: 2-5/10 this week Pain location: B knees (L > R today) Pain description: intermittent heavy, tightness, sometimes sharp Aggravating factors: change position sleeping  Relieving factors: ice, meds Rx  OBJECTIVE:    DIAGNOSTIC FINDINGS: prior to surgery: significant tricompartmental arthritis predominantly in the medial compartment where there are areas of complete cartilage loss.  There is also a radial tear of the medial meniscus posteriorly.   PATIENT SURVEYS:  08/09/2021: FOTO  06/24/2021: FOTO 47% (predicted 62%)   POSTURE:  Rounded shoulders   PALPATION: 06/24/2021 TTP: medial joint line, pt reporting lateral numbness            EDEMA: Rt: 43 centimeters, Left 38.5 centimeters   LE ROM:   Active ROM Right 06/24/21 Left 06/24/21 Right 06/28/21 Right 07/01/21 Right 07/03/21 Rt 07/10/21 Rt 07/12/21 Rt 07/15/21 Rt 07/17/21 Rt 07/24/21 Rt 08/05/21 Rt  08/19/21  Knee flexion 90 112 98 101* supine 103* supine 104  supine 109 PROM supine 105  supine  108 supine 108 supine 112 supine A/P 108/111  Knee extension -5 -2 -2 -2* supine -3* Seated LAQ -2 supine -1 supine -1 -2 -2 -1 0  (Blank rows = not tested)   Passive ROM Rt 06/24/21 Lt 06/24/21 Rt 06/28/21 Right 07/03/21 Rt 07/17/21  Knee flexion 95 115 104 115* seated 115 supine  Knee extension -3 -2 -1 -1*       LE MMT:   MMT Right 06/24/21 Left 06/24/21 Right 08/01/2021 Right 08/19/21  Hip flexion 5/5 5/5  5  Hip extension        Hip abduction        Hip adduction 5/5 5/5  5  Hip internal rotation        Hip external rotation        Knee flexion 4-/5  5/5  5  Knee extension 3+/5 5/5 4/5 5  Ankle dorsiflexion        Ankle plantarflexion        Ankle inversion        Ankle eversion        (Blank rows = not tested)   FUNCTIONAL TESTS:  06/24/2021: 5 times sit to stand: 14 seconds with UE support with walker in front if needed.    GAIT: 06/24/2021  Distance walked: amb with rolling walker with mild antalgic gait with decreased terminal knee extension on Rt LE Assistive device utilized: Walker - 2 wheeled Level of assistance: Modified independence Comments: see above  TODAY'S TREATMENT: 08/23/21 Sci fit bike L3 X 8 min Leg extension machine Rt side eccentrics 5# 2X10 Hamstring curl machine Rt 25# 2X10 Seated SLR 2X10 bilat  Vasopneumatic R knee Medium 10 minutes 34* TENS pre-mod X 10 min to bilat knee for pain control    08/22/2021: Sci fit bike Level 3 for 5 minutes  Seated straight leg raises 5 sets of 5 with 1# (lots of correction and feedback needed)  Neuromuscular re-education: Tandem balance 6X 20 seconds each  Vasopneumatic R knee Medium 10 minutes 34* TENS pre-mod X 10 min to Lt knee for pain control   08/19/21 Sci fit bike L3 X 8 min seat#10 Seated straight leg raises 2 sets of 10 with 2# Leg extension machine eccentrics for Rt side 5# 2X10 Seated hamstring curl green on Rt 2X10 Seated hamstring stretch 3 X 30 sec on Rt  Manual therapy: Rt knee PROM with flexion mobs  Modalities: Vasopnuematic X 10 min medium compression and 34 deg to Rt knee for edema and pain control TENS IFC X 10 min to Lt knee for pain control   08/12/21 Sci fit bike L 3 X 10 min seat#9 Seated straight leg raises 2 sets of 5 with 3# Sit to stand no UE support X 10 slow eccentrics  Manual therapy: Rt knee PROM with flexion mobs, Rt hamstring manual stretching 1 min X 3, skilled palpation with active compression with DN to her Rt lateral hamstring and gastroc. Verbal consent given and education handout provided for this. Twitch response  elicited and good overall tolerance to this intervention today.  Vasopnuematic X 10 min medium compression and 34 deg to Rt knee for edema and pain control    PATIENT EDUCATION:  Education details: PT POC, HEP Person educated: Patient Education method: Explanation, Demonstration, Tactile cues, Verbal cues, and Handouts Education comprehension: verbalized understanding and returned demonstration  HOME EXERCISE PROGRAM: Access Code: [place] URL: [email or web address] Date: 08/05/2021 Prepared by: Elsie Ra  Exercises - Seated Knee Flexion Stretch  - 2 x daily - 6 x weekly - 1-2 sets - 10 reps - 5 sec hold - Supine Quadriceps Stretch with Strap on Table  - 1-2 x daily - 6 x weekly - 1 sets - 2-3 reps - 30 hold - Seated Hamstring Stretch  - 1-2 x daily - 6 x weekly - 1 sets - 3 reps - 30 hold - Sit to Stand  - 2 x daily - 6 x weekly - 1-2 sets - 10 reps - Recumbent Bike  - 1 x daily - 3 x weekly - 10-15 min hold - Single Leg Knee Extension with Weight Machine  - 1 x daily - 2-3 x weekly - 2-3 sets - 10 reps - Hamstring Curl with Weight Machine  - 1 x daily - 2-3 x weekly - 2-3 sets - 10 reps - Hip Abduction Machine  - 1 x daily - 2-3 x weekly - 2-3 sets - 10 reps   ASSESSMENT:   CLINICAL IMPRESSION: We again cautioned her about too much activity as she has signs or inflammation and more pain in her knees lately. We used TENS and vasopnumatic in efforts to reduce pain, inflammation, and edema post session and did not have her perform weight bearing exercises and instead performed NWB strengthening.   OBJECTIVE IMPAIRMENTS Abnormal gait, decreased activity tolerance, decreased balance, decreased mobility, difficulty walking, decreased ROM, decreased strength, increased edema, impaired flexibility, postural dysfunction, and pain.    ACTIVITY LIMITATIONS cleaning and community activity.    PERSONAL FACTORS 3+ comorbidities: DM, hypothyroidism, HA  are also affecting  patient's functional outcome.    REHAB POTENTIAL: Good   CLINICAL DECISION MAKING: Stable/uncomplicated   EVALUATION COMPLEXITY: Low     GOALS: Goals reviewed with patient? Yes Short term PT Goals (target date for Short term goals are 4 weeks 07/26/2021) Patient will demonstrate independent use of home exercise program to maintain progress from in clinic treatments. Goal status: MET 07/24/2021   Long term PT goals (target dates for all long term goals are 8 weeks  08/23/2021 ) Patient will demonstrate/report pain at worst less than or equal to 2/10 to facilitate minimal limitation in daily activity secondary to pain symptoms. Goal status: on-going 08/22/2021   Patient will demonstrate independent use of home exercise program to facilitate ability to maintain/progress functional gains from skilled physical therapy services. Goal status: on-going 08/22/2021   Patient will demonstrate FOTO outcome > or = 62 % to indicate reduced disability due to condition. Goal status: On Going 08/22/2021  MET on 07/15/2021: 62%  Pt will be able to amb with no device with normalized gait pattern on community surfaces.  Goal status: On-going 08/22/2021       5.  Pt will improve Rt knee flexion to >/= 120 degrees to improve functional mobility and gait.  Goal status: ongoing 08/22/2021     PLAN: PT FREQUENCY:2-3/week   PT DURATION: 8 weeks   PLANNED INTERVENTIONS: Therapeutic exercises, Therapeutic activity, Neuro Muscular re-education, Balance training, Gait training, Patient/Family education, Joint mobilization, Stair training, DME instructions, Dry Needling, Electrical stimulation, Cryotherapy, Moist heat, Taping, Ultrasound, Ionotophoresis 55m/ml Dexamethasone, and Manual therapy.  All included unless contraindicated   PLAN FOR NEXT SESSION: Continue TENS and vaso to help with pain if desired.  Encourage her to avoid overuse and limit weight bearing activity until pain calms down.  Progress  B  quadriceps strength as able.  Debbe Odea, PT, DPT 08/23/2021, 9:29 AM

## 2021-08-27 ENCOUNTER — Encounter: Payer: Medicare Other | Admitting: Physical Therapy

## 2021-08-28 ENCOUNTER — Encounter: Payer: Medicare Other | Admitting: Physical Therapy

## 2021-08-30 ENCOUNTER — Encounter: Payer: Medicare Other | Admitting: Physical Therapy

## 2021-10-06 ENCOUNTER — Emergency Department (HOSPITAL_BASED_OUTPATIENT_CLINIC_OR_DEPARTMENT_OTHER)
Admission: EM | Admit: 2021-10-06 | Discharge: 2021-10-07 | Disposition: A | Discharge status: Home / Self Care | Payer: Medicare Other | Attending: Emergency Medicine | Admitting: Emergency Medicine

## 2021-10-06 ENCOUNTER — Other Ambulatory Visit: Payer: Self-pay

## 2021-10-06 ENCOUNTER — Emergency Department (HOSPITAL_BASED_OUTPATIENT_CLINIC_OR_DEPARTMENT_OTHER): Payer: Medicare Other

## 2021-10-06 ENCOUNTER — Encounter (HOSPITAL_BASED_OUTPATIENT_CLINIC_OR_DEPARTMENT_OTHER): Payer: Self-pay

## 2021-10-06 DIAGNOSIS — Y93B9 Activity, other involving muscle strengthening exercises: Secondary | ICD-10-CM | POA: Insufficient documentation

## 2021-10-06 DIAGNOSIS — M25562 Pain in left knee: Secondary | ICD-10-CM | POA: Diagnosis not present

## 2021-10-06 MED ORDER — ONDANSETRON HCL 4 MG/2ML IJ SOLN: 4.0000 mg | Freq: Once | INTRAMUSCULAR | Status: DC

## 2021-10-06 MED ORDER — HYDROMORPHONE HCL 1 MG/ML IJ SOLN
0.5000 mg | Freq: Once | INTRAMUSCULAR | Status: AC
  Administered 2021-10-06: 0.5 mg via INTRAMUSCULAR

## 2021-10-06 MED ORDER — HYDROMORPHONE HCL 1 MG/ML IJ SOLN
0.5000 mg | Freq: Once | INTRAMUSCULAR | Status: DC
  Filled 2021-10-06: qty 1

## 2021-10-06 MED ORDER — ONDANSETRON HCL 4 MG/2ML IJ SOLN
4.0000 mg | Freq: Once | INTRAMUSCULAR | Status: DC
  Filled 2021-10-06: qty 2

## 2021-10-06 MED ORDER — ONDANSETRON HCL 4 MG PO TABS: 4.0000 mg | ORAL_TABLET | Freq: Once | ORAL | Status: DC

## 2021-10-06 MED ORDER — ONDANSETRON 4 MG PO TBDP
ORAL_TABLET | ORAL | Status: AC
  Administered 2021-10-06: 4 mg
  Filled 2021-10-06: qty 1

## 2021-10-06 MED ORDER — KETOROLAC TROMETHAMINE 15 MG/ML IJ SOLN
15.0000 mg | Freq: Once | INTRAMUSCULAR | Status: AC
  Administered 2021-10-06: 15 mg via INTRAMUSCULAR
  Filled 2021-10-06: qty 1

## 2021-10-06 NOTE — ED Triage Notes (Signed)
Patient BIB [facility] from Home.  Endorses having a Right Knee Replacement and was completing Exercises for Same tonight. States she was Mildly Stretching Left Knee when she felt a Sudden onset of Pain.   No Known or Acute Trauma.   NAD Noted during Triage. A&Ox4. GCS 15. BIB Wheelchair/Stretcher.

## 2021-10-06 NOTE — ED Provider Notes (Signed)
[facility] Provider Note   CSN: [id number] Arrival date & time: 10/06/21  2052     History {Add pertinent medical, surgical, social history, OB history to HPI:1} Chief Complaint  Patient presents with   Knee Pain    Krystal Lang is a 67 y.o. female.   Knee Pain Patient with recent right knee replacement.  Was doing exercises today and after the exercise stood up and felt acute sudden severe left anterior knee pain.  Some swelling.  Pain worse with movement still cannot straighten out.  No other injury.  Did not fall.  Not on blood thinners.  Patient's husband is a cardiologist with the [employer] and is present here     Home Medications Prior to Admission medications   Medication Sig Start Date End Date Taking? Authorizing Provider  amoxicillin (AMOXIL) 500 MG capsule Take 500 mg by mouth 3 (three) times daily. 10/13/19   [provider]  atorvastatin (LIPITOR) 20 MG tablet Take 20 mg by mouth daily.  08/18/19   [provider]  empagliflozin (JARDIANCE) 10 MG TABS tablet Take 10 mg by mouth daily.    [provider]  finasteride (PROSCAR) 5 MG tablet Take by mouth.  09/01/19   [provider]  fluocinonide (LIDEX) 0.05 % external solution Apply on scalp daily for 2 weeks and stop it . 09/01/19   [provider]  gabapentin (NEURONTIN) 100 MG capsule Take 100 mg by mouth 3 (three) times daily.     [provider]  glimepiride (AMARYL) 4 MG tablet Take 4 mg by mouth daily with breakfast.     [provider]  HYDROcodone-acetaminophen (NORCO) 10-325 MG tablet Take 1 tablet by mouth daily as needed.  07/06/16   [provider]  levothyroxine (SYNTHROID) 112 MCG tablet Take 112 mcg by mouth daily. 09/05/19   [provider]  levothyroxine (SYNTHROID, LEVOTHROID) 125 MCG tablet Take 125 mcg by mouth daily.     [provider]  metFORMIN (GLUCOPHAGE) 1000 MG tablet Take  1,000 mg by mouth 2 (two) times daily. 12/02/19   [provider]  methylPREDNISolone (MEDROL DOSEPAK) 4 MG TBPK tablet Take as directed per package instructions 07/08/16   Garald Balding, MD  metroNIDAZOLE (METROCREAM) 0.75 % cream Apply topically.  09/01/19   [provider]  sitaGLIPtan-metformin (JANUMET) 50-500 MG per tablet Take 1 tablet by mouth 2 (two) times daily with a meal.    [provider]  sitaGLIPtin (JANUVIA) 100 MG tablet Take 100 mg by mouth daily.    [provider]      Allergies    Patient has no known allergies.    Review of Systems   Review of Systems  Physical Exam Updated Vital Signs BP (!) 131/57   Pulse (!) 55   Temp (!) 96.6 F (35.9 C) (Temporal)   Resp 18   Ht '5\' 4"'$  (1.626 m)   Wt 77.1 kg   SpO2 98%   BMI 29.18 kg/m  Physical Exam Vitals and nursing note reviewed.  Musculoskeletal:        General: Tenderness present.     Comments: Tenderness to anterior left knee with decreased range of motion.  Pain even with passive movement.  Cannot extend completely.  Tenderness is over anterior joint space.  Skin:    Capillary Refill: Capillary refill takes less than 2 seconds.  Neurological:     Mental Status: She is alert and oriented to person, place,  and time.     ED Results / Procedures / Treatments   Labs (all labs ordered are listed, but only abnormal results are displayed) Labs Reviewed - No data to display  EKG None  Radiology DG Knee Complete 4 Views Left  Result Date: 10/06/2021 CLINICAL DATA:  Left knee pain. EXAM: LEFT KNEE - COMPLETE 4+ VIEW COMPARISON:  08/07/2021. FINDINGS: A bony density is present anterior to the joint space at the tibial plateau seen only on the lateral view. No donor site is identified. No dislocation. Moderate degenerative changes are noted at the knee, most pronounced in the medial and patellofemoral compartments. There is a trace suprapatellar joint effusion. The soft tissues  are within normal limits. IMPRESSION: 1. Bony density along the anterior joint space of the knee which is new from the previous exam. No definite donor site is seen. Findings may represent an osteophyte versus fracture. CT is recommended for further evaluation. 2. Moderate degenerative changes at the knee. 3. Trace suprapatellar joint effusion. Electronically Signed   By: Brett Fairy M.D.   On: 10/06/2021 21:58    Procedures Procedures  {Document cardiac monitor, telemetry assessment procedure when appropriate:1}  Medications Ordered in ED Medications  HYDROmorphone (DILAUDID) injection 0.5 mg (has no administration in time range)  ondansetron (ZOFRAN) injection 4 mg (has no administration in time range)  ketorolac (TORADOL) 15 MG/ML injection 15 mg (15 mg Intramuscular Given 10/06/21 2152)    ED Course/ Medical Decision Making/ A&P                           Medical Decision Making Amount and/or Complexity of Data Reviewed Radiology: ordered.  Risk Prescription drug management.   Patient acute left knee pain.  Was attempting to stand and felt severe pain in the knee.  Anterior tenderness.  Unable to straight completely both actively and passively.  X-ray done and independently interpreted and reviewed with patient and husband.  Does show anterior bony density.  Had not previously been present in the spot.  Bone spur versus potentially donor from another spot.  Will get CT scan to evaluate.  Had pain and given Toradol.  Continued pain we will give some Dilaudid to help  {Document critical care time when appropriate:1} {Document review of labs and clinical decision tools ie heart score, Chads2Vasc2 etc:1}  {Document your independent review of radiology images, and any outside records:1} {Document your discussion with family members, caretakers, and with consultants:1} {Document social determinants of health affecting pt's care:1} {Document your decision making why or why not admission,  treatments were needed:1} Final Clinical Impression(s) / ED Diagnoses Final diagnoses:  None    Rx / DC Orders ED Discharge Orders     None

## 2021-10-07 ENCOUNTER — Telehealth: Payer: Self-pay | Admitting: Orthopaedic Surgery

## 2021-10-07 MED ORDER — OXYCODONE HCL 5 MG PO TABS: 5.0000 mg | ORAL_TABLET | Freq: Four times a day (QID) | ORAL | 0 refills | Status: DC | PRN

## 2021-10-07 NOTE — Discharge Instructions (Signed)
Follow-up with one of your orthopedic surgeons for the pain.  Use the knee immobilizer and crutches for now.

## 2021-10-07 NOTE — Telephone Encounter (Signed)
Pt called and states that she would like to speak with Dr.Whitfield about her not being able to come in

## 2021-10-08 ENCOUNTER — Encounter: Payer: Self-pay | Admitting: Orthopaedic Surgery

## 2021-10-08 ENCOUNTER — Ambulatory Visit (INDEPENDENT_AMBULATORY_CARE_PROVIDER_SITE_OTHER): Payer: Medicare Other | Admitting: Orthopaedic Surgery

## 2021-10-08 ENCOUNTER — Telehealth: Payer: Self-pay | Admitting: Orthopaedic Surgery

## 2021-10-08 DIAGNOSIS — M170 Bilateral primary osteoarthritis of knee: Secondary | ICD-10-CM | POA: Diagnosis not present

## 2021-10-08 NOTE — Progress Notes (Signed)
Office Visit Note   Patient: Krystal Lang           Date of Birth: 04/28/1955           MRN: [id number] Visit Date: 10/08/2021              Requested by: Dixie Dials, [place],  [place] [address] PCP: Dixie Dials, MD   Assessment & Plan: Visit Diagnoses:  1. Bilateral primary osteoarthritis of knee     Plan: Mrs. Linebaugh is accompanied by her husband and here for evaluation of left knee pain.  She has had a prior right total knee replacement performed in [place] and has been exercising.  Several days ago after exercising her right leg she developed pain in her left knee without a specific injury or trauma.  She was unable to fully extend her knee with pain along the medial compartment was seen in the emergency room.  Films of the knee were negative.  A CT scan revealed what may be either a loose body or a large osteophyte along the medial compartment.  She was placed in a knee immobilizer and relates that she can walk without but otherwise is having difficulty bearing weight in her left knee.  She may have had a very minimal effusion in her left knee but there was no localized areas of tenderness or evidence of instability.  I did review her films and thought that her problem is probably more of an osteophyte than a true loose body.  She is a little better even after several days with a knee immobilizer so we will leave that in place and have her return in a week.  Consider either cortisone injection or maybe even viscosupplementation at some point.  Might even consider an MRI scan Follow-Up Instructions: Return in about 1 week (around 10/15/2021).   Orders:  No orders of the defined types were placed in this encounter.  No orders of the defined types were placed in this encounter.     Procedures: No procedures performed   Clinical Data: No additional findings.   Subjective: Chief Complaint  Patient presents with   Left Knee - Pain  Acute onset of  left knee pain medially after exercising her right total knee replacement.  Seen in the emergency room with films and CT scan of her knee with possible either osteophyte formation medially or loose body.  Has knee immobilizer and is able to bear weight with that but has pain with knee extension.  No fever or chills  HPI  Review of Systems   Objective: Vital Signs: There were no vitals taken for this visit.  Physical Exam Constitutional:      Appearance: She is well-developed.  Eyes:     Pupils: Pupils are equal, round, and reactive to light.  Pulmonary:     Effort: Pulmonary effort is normal.  Skin:    General: Skin is warm and dry.  Neurological:     Mental Status: She is alert and oriented to person, place, and time.  Psychiatric:        Behavior: Behavior normal.     Ortho Exam right knee with minimal tenderness along the medial lateral patella that appears to track in the midline.  No instability.  Well-healed incision from her knee replacement.  Full extension flexed over 100 degrees.  No effusion.  Left knee without any localized areas of tenderness but pain along the medial joint line with extension up  to about 15 to 20 degrees of flexion.  May be very minimal effusion.  The knee was not hot warm or red.  No instability.  Specialty Comments:  No specialty comments available.  Imaging: No results found.   PMFS History: Patient Active Problem List   Diagnosis Date Noted   Bilateral primary osteoarthritis of knee 05/11/2017   Dyspareunia, female 11/13/2011   Type 2 diabetes mellitus ([place]) 11/13/2011   Alopecia 11/13/2011   History of chicken pox    Headache(784.0)    Hypothyroidism    Menses, irregular    Hirsutism    H/O fatigue    Fibroid uterus    Past Medical History:  Diagnosis Date   Cataract    Mixed form OU   Diabetes mellitus    Fibroid uterus 2001   H/O fatigue 1998   Headache(784.0)    Hirsutism 1998   Idiopathic   History of chicken pox     Hypothyroidism    Menses, irregular 1998    Family History  Problem Relation Age of Onset   Diabetes Mother    Diabetes Father    Heart disease Father        MI   Diabetes Sister    Macular degeneration Sister    Diabetes Brother    Heart disease Brother        Angioplasty    Past Surgical History:  Procedure Laterality Date   DILATION AND CURETTAGE OF UTERUS  2002   In [place]   KNEE SURGERY     TONSILECTOMY, ADENOIDECTOMY, BILATERAL MYRINGOTOMY AND TUBES     TONSILLECTOMY     Social History   Occupational History   Not on file  Tobacco Use   Smoking status: Never   Smokeless tobacco: Never  Vaping Use   Vaping Use: Never used  Substance and Sexual Activity   Alcohol use: No   Drug use: No   Sexual activity: Yes    Birth control/protection: Post-menopausal     Garald Balding, MD   Note - This record has been created using Editor, commissioning.  Chart creation errors have been sought, but may not always  have been located. Such creation errors do not reflect on  the standard of medical care.

## 2021-10-08 NOTE — Telephone Encounter (Signed)
called

## 2021-10-08 NOTE — Telephone Encounter (Signed)
Pt's husband Ajay called requesting a call back concerning pt's emergency visit on Sunday. Please call pt at [phone].

## 2021-10-09 ENCOUNTER — Telehealth: Payer: Self-pay | Admitting: Orthopaedic Surgery

## 2021-10-09 NOTE — Telephone Encounter (Signed)
Pt called requesting a referral home health care. Please call pt about this matter at [phone].

## 2021-10-09 NOTE — Telephone Encounter (Signed)
Spoke with patient. She is wanting someone to come in and sit with her while her husband works. She said that she wants someone to help her get around. I explained that typically this is done with [facility], but generally insurance does not cover this. I encouraged her to call and see, we are more than happy to write her a script if needed.

## 2021-10-15 ENCOUNTER — Ambulatory Visit (INDEPENDENT_AMBULATORY_CARE_PROVIDER_SITE_OTHER): Payer: Medicare Other | Admitting: Orthopaedic Surgery

## 2021-10-15 ENCOUNTER — Encounter: Payer: Self-pay | Admitting: Orthopaedic Surgery

## 2021-10-15 DIAGNOSIS — M1712 Unilateral primary osteoarthritis, left knee: Secondary | ICD-10-CM

## 2021-10-15 DIAGNOSIS — M1711 Unilateral primary osteoarthritis, right knee: Secondary | ICD-10-CM

## 2021-10-15 DIAGNOSIS — M170 Bilateral primary osteoarthritis of knee: Secondary | ICD-10-CM

## 2021-10-15 MED ORDER — LIDOCAINE HCL 1 % IJ SOLN
2.0000 mL | INTRAMUSCULAR | Status: AC | PRN
  Administered 2021-10-15: 2 mL

## 2021-10-15 MED ORDER — BUPIVACAINE HCL 0.25 % IJ SOLN
2.0000 mL | INTRAMUSCULAR | Status: AC | PRN
  Administered 2021-10-15: 2 mL via INTRA_ARTICULAR

## 2021-10-15 MED ORDER — METHYLPREDNISOLONE ACETATE 40 MG/ML IJ SUSP
40.0000 mg | INTRAMUSCULAR | Status: AC | PRN
  Administered 2021-10-15: 40 mg via INTRA_ARTICULAR

## 2021-10-15 NOTE — Progress Notes (Signed)
Office Visit Note   Patient: Krystal Lang           Date of Birth: Aug 07, 1955           MRN: [id number] Visit Date: 10/15/2021              Requested by: Krystal Lang, [place],  [place] [address] PCP: Krystal Dials, MD   Assessment & Plan: Visit Diagnoses:  1. Bilateral primary osteoarthritis of knee     Plan: Krystal Lang was seen last week for evaluation of left knee pain.  She notes that she is feeling better and able to bear a little bit more weight.  She still uses the knee immobilizer and a walker.  Her left knee was not hot red or swollen and there was no effusion was still having medial joint pain where she is near bone-on-bone by x-ray.  I am going to inject her knee with Xylocaine, Marcaine and 40 mg Depo-Medrol.  She will have to check her blood sugars and then I will also pre-CERT viscosupplementation.  Long discussion regarding future treatments and any questions were answered This patient is diagnosed with osteoarthritis of the knee(s).    Radiographs show evidence of joint space narrowing, osteophytes, subchondral sclerosis and/or subchondral cysts.  This patient has knee pain which interferes with functional and activities of daily living.    This patient has experienced inadequate response, adverse effects and/or intolerance with conservative treatments such as acetaminophen, NSAIDS, topical creams, physical therapy or regular exercise, knee bracing and/or weight loss.   This patient has experienced inadequate response or has a contraindication to intra articular steroid injections for at least 3 months.   This patient is not scheduled to have a total knee replacement within 6 months of starting treatment with viscosupplementation.    Follow-Up Instructions: Return Pre-CERT viscosupplementation.   Orders:  No orders of the defined types were placed in this encounter.  No orders of the defined types were placed in this encounter.      Procedures: Large Joint Inj: L knee on 10/15/2021 4:50 PM Indications: pain and diagnostic evaluation Details: 25 G 1.5 in needle, anteromedial approach  Arthrogram: No  Medications: 2 mL lidocaine 1 %; 40 mg methylPREDNISolone acetate 40 MG/ML; 2 mL bupivacaine 0.25 % Procedure, treatment alternatives, risks and benefits explained, specific risks discussed. Consent was given by the patient. Patient was prepped and draped in the usual sterile fashion.       Clinical Data: No additional findings.   Subjective: Chief Complaint  Patient presents with   Left Knee - Follow-up   Right Knee - Follow-up   Patient presents today with her husband for follow up of her bilateral knee pain. She is mostly here to discuss her left knee. She is currently wearing a knee brace on the left knee which she states has helped improved her pain. Right total knee arthroplasty was preformed on 06/05/2021 and she has been ambulating with a walker. OTC medications were being used prn for pain.  Review of Systems   Objective: Vital Signs: There were no vitals taken for this visit.  Physical Exam Constitutional:      Appearance: She is well-developed.  Pulmonary:     Effort: Pulmonary effort is normal.  Skin:    General: Skin is warm and dry.  Neurological:     Mental Status: She is alert and oriented to person, place, and time.  Psychiatric:  Behavior: Behavior normal.     Ortho Exam left knee was not hot red warm or swollen.  No effusion.  Mostly medial joint pain where on x-ray there is near bone-on-bone.  Better extension than last week with less pain and flex at least 90 degrees without instability  Specialty Comments:  No specialty comments available.  Imaging: No results found.   PMFS History: Patient Active Problem List   Diagnosis Date Noted   Bilateral primary osteoarthritis of knee 05/11/2017   Dyspareunia, female 11/13/2011   Type 2 diabetes mellitus ([place]) 11/13/2011    Alopecia 11/13/2011   History of chicken pox    Headache(784.0)    Hypothyroidism    Menses, irregular    Hirsutism    H/O fatigue    Fibroid uterus    Past Medical History:  Diagnosis Date   Cataract    Mixed form OU   Diabetes mellitus    Fibroid uterus 2001   H/O fatigue 1998   Headache(784.0)    Hirsutism 1998   Idiopathic   History of chicken pox    Hypothyroidism    Menses, irregular 1998    Family History  Problem Relation Age of Onset   Diabetes Mother    Diabetes Father    Heart disease Father        MI   Diabetes Sister    Macular degeneration Sister    Diabetes Brother    Heart disease Brother        Angioplasty    Past Surgical History:  Procedure Laterality Date   DILATION AND CURETTAGE OF UTERUS  2002   In [place]   KNEE SURGERY     TONSILECTOMY, ADENOIDECTOMY, BILATERAL MYRINGOTOMY AND TUBES     TONSILLECTOMY     Social History   Occupational History   Not on file  Tobacco Use   Smoking status: Never   Smokeless tobacco: Never  Vaping Use   Vaping Use: Never used  Substance and Sexual Activity   Alcohol use: No   Drug use: No   Sexual activity: Yes    Birth control/protection: Post-menopausal

## 2021-11-07 ENCOUNTER — Telehealth: Payer: Self-pay | Admitting: Orthopaedic Surgery

## 2021-11-07 NOTE — Telephone Encounter (Signed)
Patient called in stating she and Dr. Durward Fortes talked about getting a gel injection approved and someone would be calling her can we get this order in and get her an appt set please advise

## 2021-11-07 NOTE — Telephone Encounter (Signed)
Talked with patient concerning gel injection.  Appointment has been made for 1st injection.  Patient would like a call back from Dr. Durward Fortes.  I did advise patient that he is on vacation this week and next  week, patient then stated how can that be.  I did offer for patient to see Audrea Muscat, who is Dr. Rudene Anda PA, but she declined and only wants to see Dr. Durward Fortes.

## 2021-11-12 ENCOUNTER — Other Ambulatory Visit: Payer: Self-pay

## 2021-11-12 DIAGNOSIS — M170 Bilateral primary osteoarthritis of knee: Secondary | ICD-10-CM

## 2021-11-21 ENCOUNTER — Ambulatory Visit (INDEPENDENT_AMBULATORY_CARE_PROVIDER_SITE_OTHER): Payer: Medicare Other | Admitting: Orthopaedic Surgery

## 2021-11-21 ENCOUNTER — Encounter: Payer: Self-pay | Admitting: Orthopaedic Surgery

## 2021-11-21 DIAGNOSIS — M170 Bilateral primary osteoarthritis of knee: Secondary | ICD-10-CM

## 2021-11-21 NOTE — Progress Notes (Signed)
Office Visit Note   Patient: Krystal Lang           Date of Birth: 12/14/1955           MRN: [id number] Visit Date: 11/21/2021              Requested by: Dixie Dials, [place],  [place] [address] PCP: Dixie Dials, MD   Assessment & Plan: Visit Diagnoses:  1. Bilateral primary osteoarthritis of knee     Plan: Mrs. Brophy supposed to receive the viscosupplementation in her left knee today but she notes that she is doing very well and wants to wait.  She did have a cortisone injection several weeks ago that is made a big difference.  She also is 6 months status post primary right total knee replacement performed in [place] and she has had a few aches and pains but her knee exam was benign.  I just reassured her that she is doing fine and encouraged her to continue working on her strengthening as she does have weakness of her quads and hamstrings.  I be happy to see her back at any time.  All questions were answered  Follow-Up Instructions: Return if symptoms worsen or fail to improve.   Orders:  No orders of the defined types were placed in this encounter.  No orders of the defined types were placed in this encounter.     Procedures: No procedures performed   Clinical Data: No additional findings.   Subjective: Chief Complaint  Patient presents with   Right Knee - Follow-up   Left Knee - Follow-up  Patient presents today originally for orthovisc injection. She has been approved but decided that she does not want them today since she is doing well.   HPI  Review of Systems   Objective: Vital Signs: There were no vitals taken for this visit.  Physical Exam Constitutional:      Appearance: She is well-developed.  Eyes:     Pupils: Pupils are equal, round, and reactive to light.  Pulmonary:     Effort: Pulmonary effort is normal.  Skin:    General: Skin is warm and dry.  Neurological:     Mental Status: She is alert and oriented to  person, place, and time.  Psychiatric:        Behavior: Behavior normal.     Ortho Exam awake alert and oriented x3.  Comfortable sitting.  No use of ambulatory aid.  Right knee was not hot red warm or swollen and no effusion.  Full quick extension about 105 degrees of flexion.  Has a little bit of tenderness along the medial lateral tibial plateau but skin was intact and again no effusion.  I think she is doing very very well.  Left knee was not hot red warm or swollen and no effusion and no significant pain with full quick extension neurologically intact.  No distal edema  Specialty Comments:  No specialty comments available.  Imaging: No results found.   PMFS History: Patient Active Problem List   Diagnosis Date Noted   Bilateral primary osteoarthritis of knee 05/11/2017   Dyspareunia, female 11/13/2011   Type 2 diabetes mellitus ([place]) 11/13/2011   Alopecia 11/13/2011   History of chicken pox    Headache(784.0)    Hypothyroidism    Menses, irregular    Hirsutism    H/O fatigue    Fibroid uterus    Past Medical History:  Diagnosis Date  Cataract    Mixed form OU   Diabetes mellitus    Fibroid uterus 2001   H/O fatigue 1998   Headache(784.0)    Hirsutism 1998   Idiopathic   History of chicken pox    Hypothyroidism    Menses, irregular 1998    Family History  Problem Relation Age of Onset   Diabetes Mother    Diabetes Father    Heart disease Father        MI   Diabetes Sister    Macular degeneration Sister    Diabetes Brother    Heart disease Brother        Angioplasty    Past Surgical History:  Procedure Laterality Date   DILATION AND CURETTAGE OF UTERUS  2002   In [place]   KNEE SURGERY     TONSILECTOMY, ADENOIDECTOMY, BILATERAL MYRINGOTOMY AND TUBES     TONSILLECTOMY     Social History   Occupational History   Not on file  Tobacco Use   Smoking status: Never   Smokeless tobacco: Never  Vaping Use   Vaping Use: Never used  Substance and  Sexual Activity   Alcohol use: No   Drug use: No   Sexual activity: Yes    Birth control/protection: Post-menopausal

## 2021-11-29 ENCOUNTER — Telehealth (HOSPITAL_COMMUNITY): Payer: Self-pay | Admitting: Psychiatry

## 2021-12-10 ENCOUNTER — Telehealth: Payer: Self-pay | Admitting: Orthopaedic Surgery

## 2021-12-10 NOTE — Telephone Encounter (Signed)
Advised Dr. Durward Fortes is out of the office until next week. Offered for her to see Velta Addison this week but pt is more comfortable with Dr. Durward Fortes. Pt scheduled for next week when Dr. Durward Fortes returns

## 2021-12-10 NOTE — Telephone Encounter (Signed)
Patient spouse Priscella Mann MD called asked if his wife   Krystal Lang can see Dr. Durward Fortes for pain in her left knee. The number to contact Dr. Bing Neighbors is [phone]

## 2021-12-11 ENCOUNTER — Encounter: Payer: Self-pay | Admitting: Orthopaedic Surgery

## 2021-12-11 ENCOUNTER — Ambulatory Visit (INDEPENDENT_AMBULATORY_CARE_PROVIDER_SITE_OTHER): Payer: Medicare Other | Admitting: Orthopaedic Surgery

## 2021-12-11 DIAGNOSIS — G8929 Other chronic pain: Secondary | ICD-10-CM | POA: Diagnosis not present

## 2021-12-11 DIAGNOSIS — M1712 Unilateral primary osteoarthritis, left knee: Secondary | ICD-10-CM | POA: Diagnosis not present

## 2021-12-11 DIAGNOSIS — M25562 Pain in left knee: Secondary | ICD-10-CM

## 2021-12-11 MED ORDER — HYALURONAN 30 MG/2ML IX SOSY
30.0000 mg | PREFILLED_SYRINGE | INTRA_ARTICULAR | Status: AC | PRN
  Administered 2021-12-11: 30 mg via INTRA_ARTICULAR

## 2021-12-11 NOTE — Progress Notes (Signed)
   Procedure Note  Patient: Krystal Lang             Date of Birth: March 21, 1956           MRN: [id number]             Visit Date: 12/11/2021  Procedures: Visit Diagnoses:  1. Chronic pain of left knee   2. Unilateral primary osteoarthritis, left knee     Large Joint Inj: L knee on 12/11/2021 2:51 PM Indications: diagnostic evaluation and pain Details: 22 G 1.5 in needle, superolateral approach  Arthrogram: No  Medications: 30 mg Hyaluronan 30 MG/2ML Outcome: tolerated well, no immediate complications Procedure, treatment alternatives, risks and benefits explained, specific risks discussed. Consent was given by the patient. Immediately prior to procedure a time out was called to verify the correct patient, procedure, equipment, support staff and site/side marked as required. Patient was prepped and draped in the usual sterile fashion.    DEY#[id number]  The patient comes in today for scheduled hyaluronic acid injection with Orthovisc in the left knee to treat the pain from osteoarthritis.  This is injection #1 of a series of 3 injections.  She normally sees Dr. Durward Fortes but he is out of town this week and she wanted to try to get her injection started.  I know her husband who is a physician and we were fine to work her in today.  She actually had a right knee replacement done robotically in [facility] in March of this year.  She had a lot of questions as a relates to that knee replacement and her left knee today as well.  There is no effusion with her left knee.  Most of her pain is the medial joint line and there is varus malalignment that is correctable.  There is no significant effusion.  I did place Orthovisc injection #1 in her left knee today.  She will see Dr. Durward Fortes for #2 of the series of 3 injections next week and I can always see her back the week after that for injection #3.  She asked appropriate questions about PRP and we can always send her to Dr. Rolena Infante to  consider that.  At some point we will need a standing AP and lateral of both knees so we can look at her right knee replacement.

## 2021-12-19 ENCOUNTER — Ambulatory Visit (INDEPENDENT_AMBULATORY_CARE_PROVIDER_SITE_OTHER): Payer: Medicare Other | Admitting: Orthopaedic Surgery

## 2021-12-19 ENCOUNTER — Encounter: Payer: Self-pay | Admitting: Orthopaedic Surgery

## 2021-12-19 DIAGNOSIS — M170 Bilateral primary osteoarthritis of knee: Secondary | ICD-10-CM

## 2021-12-19 DIAGNOSIS — M1712 Unilateral primary osteoarthritis, left knee: Secondary | ICD-10-CM | POA: Diagnosis not present

## 2021-12-19 MED ORDER — HYALURONAN 30 MG/2ML IX SOSY
30.0000 mg | PREFILLED_SYRINGE | INTRA_ARTICULAR | Status: AC | PRN
  Administered 2021-12-19: 30 mg via INTRA_ARTICULAR

## 2021-12-19 NOTE — Progress Notes (Signed)
Office Visit Note   Patient: Krystal Lang           Date of Birth: 01-13-1956           MRN: [id number] Visit Date: 12/19/2021              Requested by: Dixie Dials, [place],  [place] [address] PCP: Dixie Dials, MD   Assessment & Plan: Visit Diagnoses:  1. Bilateral primary osteoarthritis of knee     Plan: Second Orthovisc injection left knee.  Return next week for the third and final injection.  No significant change in her left knee symptoms after the first injection  Follow-Up Instructions: Return in about 1 week (around 12/26/2021).   Orders:  No orders of the defined types were placed in this encounter.  No orders of the defined types were placed in this encounter.     Procedures: Large Joint Inj: L knee on 12/19/2021 3:44 PM Indications: pain and joint swelling Details: 25 G 1.5 in needle, anteromedial approach  Arthrogram: No  Medications: 30 mg Hyaluronan 30 MG/2ML Outcome: tolerated well, no immediate complications Procedure, treatment alternatives, risks and benefits explained, specific risks discussed. Consent was given by the patient. Immediately prior to procedure a time out was called to verify the correct patient, procedure, equipment, support staff and site/side marked as required. Patient was prepped and draped in the usual sterile fashion.      Clinical Data: No additional findings.   Subjective: Chief Complaint  Patient presents with   Left Knee - Follow-up    Orthovisc #2  Patient presents today for the second orthovisc injection into her left knee.  HPI  Review of Systems   Objective: Vital Signs: There were no vitals taken for this visit.  Physical Exam  Ortho Exam left knee was not hot red warm or swollen.  Mild medial joint pain consistent with her medial compartment arthritis.  No effusion  Specialty Comments:  No specialty comments available.  Imaging: No results found.   PMFS  History: Patient Active Problem List   Diagnosis Date Noted   Bilateral primary osteoarthritis of knee 05/11/2017   Dyspareunia, female 11/13/2011   Type 2 diabetes mellitus ([place]) 11/13/2011   Alopecia 11/13/2011   History of chicken pox    Headache(784.0)    Hypothyroidism    Menses, irregular    Hirsutism    H/O fatigue    Fibroid uterus    Past Medical History:  Diagnosis Date   Cataract    Mixed form OU   Diabetes mellitus    Fibroid uterus 2001   H/O fatigue 1998   Headache(784.0)    Hirsutism 1998   Idiopathic   History of chicken pox    Hypothyroidism    Menses, irregular 1998    Family History  Problem Relation Age of Onset   Diabetes Mother    Diabetes Father    Heart disease Father        MI   Diabetes Sister    Macular degeneration Sister    Diabetes Brother    Heart disease Brother        Angioplasty    Past Surgical History:  Procedure Laterality Date   DILATION AND CURETTAGE OF UTERUS  2002   In [place]   KNEE SURGERY     TONSILECTOMY, ADENOIDECTOMY, BILATERAL MYRINGOTOMY AND TUBES     TONSILLECTOMY     Social History   Occupational History   Not  on file  Tobacco Use   Smoking status: Never   Smokeless tobacco: Never  Vaping Use   Vaping Use: Never used  Substance and Sexual Activity   Alcohol use: No   Drug use: No   Sexual activity: Yes    Birth control/protection: Post-menopausal

## 2021-12-26 ENCOUNTER — Ambulatory Visit (INDEPENDENT_AMBULATORY_CARE_PROVIDER_SITE_OTHER): Payer: Medicare Other | Admitting: Orthopaedic Surgery

## 2021-12-26 ENCOUNTER — Encounter: Payer: Self-pay | Admitting: Orthopaedic Surgery

## 2021-12-26 ENCOUNTER — Ambulatory Visit (INDEPENDENT_AMBULATORY_CARE_PROVIDER_SITE_OTHER): Payer: Medicare Other

## 2021-12-26 DIAGNOSIS — M1712 Unilateral primary osteoarthritis, left knee: Secondary | ICD-10-CM | POA: Diagnosis not present

## 2021-12-26 DIAGNOSIS — M25561 Pain in right knee: Secondary | ICD-10-CM

## 2021-12-26 DIAGNOSIS — Z96651 Presence of right artificial knee joint: Secondary | ICD-10-CM

## 2021-12-26 MED ORDER — HYALURONAN 30 MG/2ML IX SOSY
30.0000 mg | PREFILLED_SYRINGE | INTRA_ARTICULAR | Status: AC | PRN
  Administered 2021-12-26: 30 mg via INTRA_ARTICULAR

## 2021-12-26 NOTE — Progress Notes (Signed)
Office Visit Note   Patient: Krystal Lang           Date of Birth: [birth year]           MRN: [id number] Visit Date: 12/26/2021              Requested by: Dixie Dials, [place],  [place] [address] PCP: Dixie Dials, MD   Assessment & Plan: Visit Diagnoses:  1. Right knee pain, unspecified chronicity   2. Unilateral primary osteoarthritis, left knee   3. History of total right knee replacement     Plan: I did place injection #3 of a series of 3 Orthovisc injections in her left knee which she tolerated well.  We talked in length in detail about her left knee and her right knee replacement.  All question concerns were answered and addressed.  We will see her back in about 2 months to see how she is responded to her left knee injections.  I gave her reassurance that she is really way ahead of what most people are with her right knee replacement.  She has excellent questions that I tried answered the best I could.  Follow-Up Instructions: Return in about 2 months (around 02/25/2022).   Orders:  Orders Placed This Encounter  Procedures   Large Joint Inj   XR KNEE 3 VIEW RIGHT   No orders of the defined types were placed in this encounter.     Procedures: Large Joint Inj: L knee on 12/26/2021 2:15 PM Indications: diagnostic evaluation and pain Details: 22 G 1.5 in needle, superolateral approach  Arthrogram: No  Medications: 30 mg Hyaluronan 30 MG/2ML Outcome: tolerated well, no immediate complications Procedure, treatment alternatives, risks and benefits explained, specific risks discussed. Consent was given by the patient. Immediately prior to procedure a time out was called to verify the correct patient, procedure, equipment, support staff and site/side marked as required. Patient was prepped and draped in the usual sterile fashion.       Clinical Data: No additional findings.   Subjective: Chief Complaint  Patient presents with   Right Knee  - Pain   Left Knee - Pain  The patient is here for 2 things today.  She does have known arthritis of her left knee and is here today for injection #3 of a series of 3 Orthovisc injections of hyaluronic acid to treat the pain from osteoarthritis in her left knee.  She also has a history of a right knee replacement done just over 6 months ago.  She would like this evaluated today.  She had that original surgery done with robotic surgery in [facility].  She has had no adverse reaction to the first 2 injections.  Her knee still hurts on that left side.  She has known osteoarthritis of the left knee.  HPI  Review of Systems There is no listed fever, chills, nausea, vomiting  Objective: Vital Signs: There were no vitals taken for this visit.  Physical Exam She is alert and orient x3 and in no acute distress Ortho Exam Examination of her left knee does show some global tenderness more on the medial and posterior joint lines.  There are some patellofemoral crepitation and slight varus malalignment that is correctable.  She has excellent range of motion of her left knee and is ligamentously stable.  Her right operative knee shows a well-healed surgical incision with no effusion at all.  That knee has excellent range of motion  and is ligamentously stable. Specialty Comments:  No specialty comments available.  Imaging: XR KNEE 3 VIEW RIGHT  Result Date: 12/26/2021 3 views of the right knee show well-seated total knee arthroplasty which is press-fit with no complicating features and anatomic alignment.    PMFS History: Patient Active Problem List   Diagnosis Date Noted   Bilateral primary osteoarthritis of knee 05/11/2017   Dyspareunia, female 11/13/2011   Type 2 diabetes mellitus ([place]) 11/13/2011   Alopecia 11/13/2011   History of chicken pox    Headache(784.0)    Hypothyroidism    Menses, irregular    Hirsutism    H/O fatigue    Fibroid uterus    Past Medical History:   Diagnosis Date   Cataract    Mixed form OU   Diabetes mellitus    Fibroid uterus 2001   H/O fatigue 1998   Headache(784.0)    Hirsutism 1998   Idiopathic   History of chicken pox    Hypothyroidism    Menses, irregular 1998    Family History  Problem Relation Age of Onset   Diabetes Mother    Diabetes Father    Heart disease Father        MI   Diabetes Sister    Macular degeneration Sister    Diabetes Brother    Heart disease Brother        Angioplasty    Past Surgical History:  Procedure Laterality Date   DILATION AND CURETTAGE OF UTERUS  2002   In [place]   KNEE SURGERY     TONSILECTOMY, ADENOIDECTOMY, BILATERAL MYRINGOTOMY AND TUBES     TONSILLECTOMY     Social History   Occupational History   Not on file  Tobacco Use   Smoking status: Never   Smokeless tobacco: Never  Vaping Use   Vaping Use: Never used  Substance and Sexual Activity   Alcohol use: No   Drug use: No   Sexual activity: Yes    Birth control/protection: Post-menopausal

## 2022-01-29 ENCOUNTER — Telehealth: Payer: Self-pay | Admitting: Orthopaedic Surgery

## 2022-01-29 DIAGNOSIS — M1712 Unilateral primary osteoarthritis, left knee: Secondary | ICD-10-CM

## 2022-01-29 DIAGNOSIS — M25561 Pain in right knee: Secondary | ICD-10-CM

## 2022-01-29 NOTE — Telephone Encounter (Signed)
Patient called asked if she can get set up for (PT) at the [place] location. The number to contact patient is [phone]

## 2022-01-29 NOTE — Telephone Encounter (Signed)
Referral placed in chart. Pt was called and advised and stated understanding

## 2022-01-30 ENCOUNTER — Ambulatory Visit: Payer: Medicare Other

## 2022-01-30 ENCOUNTER — Telehealth: Payer: Self-pay | Admitting: Orthopaedic Surgery

## 2022-01-30 NOTE — Telephone Encounter (Signed)
Pt called requesting a call from Dr Durward Fortes or [place]. Pt states she had knee injections last month and still experiencing pains and would like to talk about next options. [place] call pt at [phone].

## 2022-01-30 NOTE — Telephone Encounter (Signed)
Think it is time to consider knee replacement with Dr Ninfa Linden

## 2022-01-31 NOTE — Telephone Encounter (Signed)
Lvm for pt to cb 

## 2022-02-03 ENCOUNTER — Ambulatory Visit (INDEPENDENT_AMBULATORY_CARE_PROVIDER_SITE_OTHER): Payer: Medicare Other | Admitting: Orthopaedic Surgery

## 2022-02-03 ENCOUNTER — Other Ambulatory Visit: Payer: Self-pay

## 2022-02-03 ENCOUNTER — Encounter: Payer: Self-pay | Admitting: Orthopaedic Surgery

## 2022-02-03 DIAGNOSIS — G8929 Other chronic pain: Secondary | ICD-10-CM

## 2022-02-03 DIAGNOSIS — M25562 Pain in left knee: Secondary | ICD-10-CM | POA: Diagnosis not present

## 2022-02-03 DIAGNOSIS — M1712 Unilateral primary osteoarthritis, left knee: Secondary | ICD-10-CM

## 2022-02-03 NOTE — Progress Notes (Signed)
[name] comes in today with worsening left knee pain.  She has a history of a right total knee arthroplasty done in [facility] in March of this year.  That was a robotic knee and was press-fit knee.  She still has some pain with that knee but E no for reassurance standpoint that that is normal and should get better.  Even though she states she is old she is only 25 and she is very healthy and active.  She does have well-documented arthritis of her left knee and hyaluronic acid injections now made that knee feel significantly worse.  Her left knee does have a mild effusion.  There is varus malalignment that is correctable but significant posterior medial pain especially with flexion.  She does have a positive Murray's exam to the medial compartment of her knee.  A MRI is warranted at this point of her left knee given the severity of the pain.  We need to assess the cartilage and rule out any type of stress fracture or meniscal tear so we can have a better recommendation of what the next treatment steps are.  She can continue with water aerobics and even consider acupuncture even on her operative right knee.  We will see her back after the MRI of her left knee.  She agrees with this treatment plan as well.  She is just taking Tylenol for pain and she said that is really all that she needs.  Is more frustrating for her from a mobility standpoint and weightbearing than anything.

## 2022-02-22 ENCOUNTER — Ambulatory Visit
Admission: RE | Admit: 2022-02-22 | Discharge: 2022-02-22 | Disposition: A | Discharge status: Home / Self Care | Payer: Medicare Other | Source: Ambulatory Visit | Attending: Orthopaedic Surgery | Admitting: Orthopaedic Surgery

## 2022-02-22 DIAGNOSIS — G8929 Other chronic pain: Secondary | ICD-10-CM

## 2022-02-27 ENCOUNTER — Ambulatory Visit (INDEPENDENT_AMBULATORY_CARE_PROVIDER_SITE_OTHER): Payer: Medicare Other | Admitting: Orthopaedic Surgery

## 2022-02-27 ENCOUNTER — Encounter: Payer: Self-pay | Admitting: Orthopaedic Surgery

## 2022-02-27 DIAGNOSIS — G8929 Other chronic pain: Secondary | ICD-10-CM

## 2022-02-27 DIAGNOSIS — M25562 Pain in left knee: Secondary | ICD-10-CM | POA: Diagnosis not present

## 2022-02-27 DIAGNOSIS — M1712 Unilateral primary osteoarthritis, left knee: Secondary | ICD-10-CM | POA: Diagnosis not present

## 2022-02-27 MED ORDER — CELECOXIB 200 MG PO CAPS: 200.0000 mg | ORAL_CAPSULE | Freq: Two times a day (BID) | ORAL | 3 refills | Status: DC | PRN

## 2022-02-27 NOTE — Progress Notes (Signed)
Krystal Lang comes in today with continued significant left knee pain.  She has a history of the right knee replacement done robotically in [facility] earlier this year.  She is finally turned the corner that knee is doing great for her.  However her left knee got significantly worse as a summer came along and she did have something happen within her knee when she was getting up.  She has had steroid injections and hyaluronic acid injections for that left knee and her pain is getting significantly worse.  We did obtain an MRI of her left knee today and she is here to go over that.  She is diabetic but has excellent control however she said she has been eating a lot of sweets recently with the holidays and wants to make sure her blood glucose stays under good control.  She still having significant left knee issues.  On examination she has a mild effusion today.  Her range of motion is actually full but she does have global tenderness of that knee.  The MRI does shows significant tricompartment arthritis of her knee especially with there is a full-thickness cartilage loss of the medial compartment and the patellofemoral joint.  There is complex tearing of the meniscus and mucoid degeneration of the ACL.  There is a large Baker's cyst that is leaking and a large effusion.  The only surgical treatment option at this point would be a knee replacement for her left knee.  She does have press-fit implants on her right side and would probably benefit from having this on her left side as well.  She would like to try some outpatient physical therapy to strengthen her left knee and try aquatic therapy.  She defers any type of injection today.  Will see her back in 6 weeks to see how she is doing overall and she will let us know if she would like to consider a knee replacement at this point on her left knee.  All question concerns were answered and  addressed.

## 2022-02-28 NOTE — Addendum Note (Signed)
Addended by: Robyne Peers on: 02/28/2022 02:10 PM   Modules accepted: Orders

## 2022-03-04 NOTE — Therapy (Incomplete)
OUTPATIENT PHYSICAL THERAPY LOWER EXTREMITY EVALUATION   Patient Name: Krystal Lang MRN: [id number] DOB:23-Jun-1955, 66 y.o., female Today's Date: 03/04/2022  END OF SESSION:   Past Medical History:  Diagnosis Date   Cataract    Mixed form OU   Diabetes mellitus    Fibroid uterus 2001   H/O fatigue 1998   Headache(784.0)    Hirsutism 1998   Idiopathic   History of chicken pox    Hypothyroidism    Menses, irregular 1998   Past Surgical History:  Procedure Laterality Date   DILATION AND CURETTAGE OF UTERUS  2002   In [place]   KNEE SURGERY     TONSILECTOMY, ADENOIDECTOMY, BILATERAL MYRINGOTOMY AND TUBES     TONSILLECTOMY     Patient Active Problem List   Diagnosis Date Noted   Unilateral primary osteoarthritis, left knee 02/27/2022   Bilateral primary osteoarthritis of knee 05/11/2017   Dyspareunia, female 11/13/2011   Type 2 diabetes mellitus ([place]) 11/13/2011   Alopecia 11/13/2011   History of chicken pox    Headache(784.0)    Hypothyroidism    Menses, irregular    Hirsutism    H/O fatigue    Fibroid uterus     PCP: Dixie Dials, MD   REFERRING PROVIDER: Jean Rosenthal, MD   REFERRING DIAG: [phone] (ICD-10-CM) - Chronic pain of left knee   THERAPY DIAG:  No diagnosis found.  Rationale for Evaluation and Treatment: Rehabilitation  ONSET DATE: ***  SUBJECTIVE:   SUBJECTIVE STATEMENT: ***  PERTINENT HISTORY: Rt TKA-robotic 2023, DM PAIN:  Are you having pain? Yes: NPRS scale: ***/10 Pain location: *** Pain description: *** Aggravating factors: *** Relieving factors: ***  PRECAUTIONS: None  WEIGHT BEARING RESTRICTIONS: No  FALLS:  Has patient fallen in last 6 months? {fallsyesno:27318}  LIVING ENVIRONMENT: Lives with: {OPRC lives with:25569::"lives with their family"} Lives in: {Lives in:[address]} Stairs: {opstairs:27293} Has following equipment at home: {Assistive devices:23999}  OCCUPATION: ***  PLOF:  {PLOF:24004}  PATIENT GOALS: ***  NEXT MD VISIT: 04/10/22  OBJECTIVE:   DIAGNOSTIC FINDINGS:  MRI: from MD notes The MRI does shows significant tricompartment arthritis of her knee especially with there is a full-thickness cartilage loss of the medial compartment and the patellofemoral joint. There is complex tearing of the meniscus and mucoid degeneration of the ACL. There is a large Baker's cyst that is leaking and a large effusion.   PATIENT SURVEYS:  FOTO ***  COGNITION: Overall cognitive status: Within functional limits for tasks assessed     SENSATION: WFL  EDEMA:  Circumferential: ***  MUSCLE LENGTH: Hamstrings: Right *** deg; Left *** deg Thomas test: Right *** deg; Left *** deg  POSTURE: {posture:25561}  PALPATION: ***  LOWER EXTREMITY ROM:  {AROM/PROM:27142} ROM Right eval Left eval  Hip flexion    Hip extension    Hip abduction    Hip adduction    Hip internal rotation    Hip external rotation    Knee flexion    Knee extension    Ankle dorsiflexion    Ankle plantarflexion    Ankle inversion    Ankle eversion     (Blank rows = not tested)  LOWER EXTREMITY MMT:  MMT Right eval Left eval  Hip flexion    Hip extension    Hip abduction    Hip adduction    Hip internal rotation    Hip external rotation    Knee flexion    Knee extension    Ankle dorsiflexion  Ankle plantarflexion    Ankle inversion    Ankle eversion     (Blank rows = not tested)  LOWER EXTREMITY SPECIAL TESTS:  {LEspecialtests:26242}  FUNCTIONAL TESTS:  {Functional tests:24029}  GAIT: Distance walked: *** Assistive device utilized: None Level of assistance: {Levels of assistance:24026} Comments: ***   TODAY'S TREATMENT:                                                                                                                              DATE: 03/05/22    PATIENT EDUCATION:  Education details: *** Person educated: Patient Education method:  Consulting civil engineer, Media planner, and Handouts Education comprehension: verbalized understanding and returned demonstration  HOME EXERCISE PROGRAM: ***  ASSESSMENT:  CLINICAL IMPRESSION: Patient is a 66 y.o. female who was seen today for physical therapy evaluation and treatment for Lt knee pain.  Pt has a history of Rt TKA with robotic procedure earlier this year.    OBJECTIVE IMPAIRMENTS: {opptimpairments:25111}.   ACTIVITY LIMITATIONS: {activitylimitations:27494}  PARTICIPATION LIMITATIONS: {participationrestrictions:25113}  PERSONAL FACTORS: {Personal factors:25162} are also affecting patient's functional outcome.   REHAB POTENTIAL: Good  CLINICAL DECISION MAKING: Stable/uncomplicated  EVALUATION COMPLEXITY: Low   GOALS: Goals reviewed with patient? Yes  SHORT TERM GOALS: Target date:  Be independent in initial HEP Baseline: Goal status: INITIAL  2.  *** Baseline:  Goal status: {GOALSTATUS:25110}  3.  *** Baseline:  Goal status: {GOALSTATUS:25110}  4.  *** Baseline:  Goal status: {GOALSTATUS:25110}  5.  *** Baseline:  Goal status: {GOALSTATUS:25110}  6.  *** Baseline:  Goal status: {GOALSTATUS:25110}  LONG TERM GOALS: Target date: ***  Be independent in advanced HEP Baseline:  Goal status: INITIAL  2.  *** Baseline:  Goal status: INITIAL  3.  *** Baseline:  Goal status: {GOALSTATUS:25110}  4.  *** Baseline:  Goal status: {GOALSTATUS:25110}  5.  *** Baseline:  Goal status: {GOALSTATUS:25110}  6.  *** Baseline:  Goal status: {GOALSTATUS:25110}   PLAN:  PT FREQUENCY: 2x/week  PT DURATION: 8 weeks  PLANNED INTERVENTIONS: Therapeutic exercises, Therapeutic activity, Neuromuscular re-education, Balance training, Gait training, Patient/Family education, Self Care, Joint mobilization, Stair training, Aquatic Therapy, Dry Needling, Electrical stimulation, Cryotherapy, Moist heat, Vasopneumatic device, Manual therapy, and  Re-evaluation  PLAN FOR NEXT SESSION: Sigurd Sos, PT 03/04/22 8:16 AM

## 2022-03-05 ENCOUNTER — Ambulatory Visit: Payer: Medicare Other | Attending: Orthopaedic Surgery

## 2022-03-05 ENCOUNTER — Other Ambulatory Visit: Payer: Self-pay

## 2022-03-05 DIAGNOSIS — M6281 Muscle weakness (generalized): Secondary | ICD-10-CM | POA: Diagnosis not present

## 2022-03-05 DIAGNOSIS — M25562 Pain in left knee: Secondary | ICD-10-CM | POA: Diagnosis present

## 2022-03-05 DIAGNOSIS — M1712 Unilateral primary osteoarthritis, left knee: Secondary | ICD-10-CM | POA: Diagnosis not present

## 2022-03-05 DIAGNOSIS — M25561 Pain in right knee: Secondary | ICD-10-CM | POA: Insufficient documentation

## 2022-03-05 DIAGNOSIS — G8929 Other chronic pain: Secondary | ICD-10-CM | POA: Insufficient documentation

## 2022-03-05 DIAGNOSIS — R252 Cramp and spasm: Secondary | ICD-10-CM | POA: Diagnosis present

## 2022-03-05 DIAGNOSIS — R262 Difficulty in walking, not elsewhere classified: Secondary | ICD-10-CM | POA: Diagnosis present

## 2022-03-05 NOTE — Therapy (Signed)
OUTPATIENT PHYSICAL THERAPY LOWER EXTREMITY EVALUATION   Patient Name: Krystal Lang MRN: [id number] DOB:[birth year], 66 y.o., female Today's Date: 03/06/2022  END OF SESSION:  PT End of Session - 03/05/22 1216     Visit Number 1    Date for PT Re-Evaluation 04/30/22    Authorization Type MEDICARE PART A AND B    Progress Note Due on Visit 10    PT Start Time 1145    PT Stop Time 1235    PT Time Calculation (min) 50 min    Activity Tolerance Patient tolerated treatment well    Behavior During Therapy WFL for tasks assessed/performed             Past Medical History:  Diagnosis Date   Cataract    Mixed form OU   Diabetes mellitus    Fibroid uterus 2001   H/O fatigue 1998   Headache(784.0)    Hirsutism 1998   Idiopathic   History of chicken pox    Hypothyroidism    Menses, irregular 1998   Past Surgical History:  Procedure Laterality Date   DILATION AND CURETTAGE OF UTERUS  2002   In [place]   KNEE SURGERY     TONSILECTOMY, ADENOIDECTOMY, BILATERAL MYRINGOTOMY AND TUBES     TONSILLECTOMY     Patient Active Problem List   Diagnosis Date Noted   Unilateral primary osteoarthritis, left knee 02/27/2022   Bilateral primary osteoarthritis of knee 05/11/2017   Dyspareunia, female 11/13/2011   Type 2 diabetes mellitus ([place]) 11/13/2011   Alopecia 11/13/2011   History of chicken pox    Headache(784.0)    Hypothyroidism    Menses, irregular    Hirsutism    H/O fatigue    Fibroid uterus     PCP: Dixie Dials, MD   REFERRING PROVIDER: Mcarthur Rossetti, MD  REFERRING DIAG: [phone] (ICD-10-CM) - Chronic pain of left knee  THERAPY DIAG:  Muscle weakness (generalized)  Chronic pain of left knee  Difficulty in walking, not elsewhere classified  Cramp and spasm  Rationale for Evaluation and Treatment: Rehabilitation  ONSET DATE: 02/28/2022  SUBJECTIVE:   SUBJECTIVE STATEMENT: Patient states she had an injury to her left knee a while  back.  MRI shows torn meniscus, most likely surgical indication but she is unable to schedule surgery at this time due to MD scheduling conflicts.  She is having pain with weight bearing activities and with some rotational movements in open chain positions as well.  She has pain with stairs, bending, stooping and squatting.  She would like to get the knee as strong as possible before she has surgery.    PERTINENT HISTORY: Prior surgery right knee PAIN:  Are you having pain? Yes: NPRS scale: 7/10 Pain location: left knee Pain description: aching Aggravating factors: weight bearing, twisting Relieving factors: medication, rest  PRECAUTIONS: None  WEIGHT BEARING RESTRICTIONS: No  FALLS:  Has patient fallen in last 6 months? Yes. Number of falls 1  LIVING ENVIRONMENT: Lives with: lives with their family Lives in: House/apartment Stairs: Yes: Internal: 12 steps; can reach both and External: 5 steps; on left going up Has following equipment at home: None  OCCUPATION: retired  PLOF: Independent, Independent with basic ADLs, Independent with household mobility without device, Independent with community mobility without device, Independent with homemaking with ambulation, Independent with gait, and Independent with transfers  PATIENT GOALS: She would like to get the knee as strong as possible before she has surgery.    NEXT MD  VISIT: not scheduled currently  OBJECTIVE:   DIAGNOSTIC FINDINGS: IMPRESSION: 1. Radial tear involving the posterior horn of the medial meniscus at the meniscal root with detachment and medial protrusion of the meniscus. 2. Horizontal cleavage-type tear in the midbody region of the medial meniscus. 3. Severe mucoid degeneration of the ACL. 4. Advanced tricompartmental degenerative changes as described above. 5. Large joint effusion and moderate-sized leaking Baker's cyst.  PATIENT SURVEYS:  FOTO 54 (goal is 89)  COGNITION: Overall cognitive status:  Within functional limits for tasks assessed     SENSATION: WFL  POSTURE: No Significant postural limitations  LOWER EXTREMITY ROM:  Active ROM Right eval Left eval  Hip flexion    Hip extension    Hip abduction    Hip adduction    Hip internal rotation    Hip external rotation    Knee flexion  95  Knee extension  -5  Ankle dorsiflexion    Ankle plantarflexion    Ankle inversion    Ankle eversion     (Blank rows = not tested)  LOWER EXTREMITY MMT:  Generally 4- to 4/5 left LE  LOWER EXTREMITY SPECIAL TESTS:  Knee special tests: deferred due to MRI findings  FUNCTIONAL TESTS:  5 times sit to stand: complete next visit Timed up and go (TUG): complete next visit  GAIT: Distance walked: 30 Assistive device utilized: None Level of assistance: Complete Independence Comments: antalgic   TODAY'S TREATMENT:                                                                                                                              DATE: 03/05/22 Initial eval completed and initiated HEP    PATIENT EDUCATION:  Education details: Initiated HEP, educated on anatomy of the knee and variations in meniscus tears/ chance of healing Person educated: Patient Education method: Explanation, Demonstration, Tactile cues, Verbal cues, and Handouts Education comprehension: verbalized understanding, returned demonstration, verbal cues required, and tactile cues required  HOME EXERCISE PROGRAM: Access Code: [email or web address] URL: [email or web address] Date: 03/06/2022 Prepared by: Candyce Churn  Exercises - Supine Quadricep Sets  - 1 x daily - 7 x weekly - 3 sets - 10 reps - Small Range Straight Leg Raise  - 1 x daily - 7 x weekly - 3 sets - 10 reps - Supine Heel Slide  - 1 x daily - 7 x weekly - 3 sets - 10 reps - Seated Knee Extension Stretch with Chair  - 1 x daily - 7 x weekly - 1 sets - 5 reps - as long as you can hold  ASSESSMENT:  CLINICAL IMPRESSION: Patient is  a 66 y.o. female who was seen today for physical therapy evaluation and treatment for left knee pain due to confirmed meniscus tear. She presents with decreased ROM, strength and function of the left knee.  She would respond well to quad rehab and hip strengthening to prepare for possible surgery.   OBJECTIVE IMPAIRMENTS: difficulty  walking, decreased ROM, decreased strength, increased fascial restrictions, increased muscle spasms, impaired flexibility, and pain.   ACTIVITY LIMITATIONS: carrying, lifting, bending, sitting, standing, squatting, sleeping, stairs, transfers, and dressing  PARTICIPATION LIMITATIONS: meal prep, cleaning, laundry, driving, shopping, community activity, and yard work  PERSONAL FACTORS: Age, Fitness, and Past/current experiences are also affecting patient's functional outcome.   REHAB POTENTIAL: Good  CLINICAL DECISION MAKING: Stable/uncomplicated  EVALUATION COMPLEXITY: Low   GOALS: Goals reviewed with patient? Yes  SHORT TERM GOALS: Target date: 04/02/2022   Pain report to be no greater than 4/10  Baseline: Goal status: INITIAL  2.  Patient will be independent with initial HEP  Baseline:  Goal status: INITIAL   LONG TERM GOALS: Target date: 04/30/2022    Patient to report pain no greater than 2/10  Baseline:  Goal status: INITIAL  2.  Patient to be independent with advanced HEP  Baseline:  Goal status: INITIAL  3.  FOTO score of 63 or better Baseline:  Goal status: INITIAL  4.  Patient to report 50-85% improvement in overall symptoms Baseline:  Goal status: INITIAL  5.  Patient to be able to ascend and descend steps with reciprocal gait safely Baseline:  Goal status: INITIAL   PLAN:  PT FREQUENCY: 2x/week  PT DURATION: 8 weeks  PLANNED INTERVENTIONS: Therapeutic exercises, Therapeutic activity, Neuromuscular re-education, Balance training, Gait training, Patient/Family education, Self Care, Joint mobilization, Stair training,  Aquatic Therapy, Dry Needling, Electrical stimulation, Cryotherapy, Moist heat, Splintting, Taping, Vasopneumatic device, Ultrasound, Ionotophoresis '4mg'$ /ml Dexamethasone, Manual therapy, and Re-evaluation  PLAN FOR NEXT SESSION: Review HEP or pool if available, nustep, begin quad rehab   Comeri­o B. Julyanna Scholle, PT 03/06/22 1:39 AM  [facility] [address] [place], [place] [address] Phone # [phone] Fax [phone]

## 2022-03-13 ENCOUNTER — Ambulatory Visit: Payer: Medicare Other | Admitting: Physical Therapy

## 2022-03-17 ENCOUNTER — Ambulatory Visit (HOSPITAL_BASED_OUTPATIENT_CLINIC_OR_DEPARTMENT_OTHER): Payer: Medicare Other | Admitting: Physical Therapy

## 2022-03-19 ENCOUNTER — Ambulatory Visit (HOSPITAL_BASED_OUTPATIENT_CLINIC_OR_DEPARTMENT_OTHER): Payer: Medicare Other | Attending: Orthopaedic Surgery | Admitting: Physical Therapy

## 2022-03-19 ENCOUNTER — Encounter (HOSPITAL_BASED_OUTPATIENT_CLINIC_OR_DEPARTMENT_OTHER): Payer: Self-pay | Admitting: Physical Therapy

## 2022-03-19 DIAGNOSIS — G8929 Other chronic pain: Secondary | ICD-10-CM | POA: Diagnosis not present

## 2022-03-19 DIAGNOSIS — R262 Difficulty in walking, not elsewhere classified: Secondary | ICD-10-CM | POA: Diagnosis not present

## 2022-03-19 DIAGNOSIS — M6281 Muscle weakness (generalized): Secondary | ICD-10-CM | POA: Diagnosis not present

## 2022-03-19 DIAGNOSIS — M25562 Pain in left knee: Secondary | ICD-10-CM | POA: Diagnosis present

## 2022-03-19 NOTE — Therapy (Signed)
OUTPATIENT PHYSICAL THERAPY LOWER EXTREMITY TREATMENT   Patient Name: Krystal Lang MRN: [id number] DOB:[birth year], 66 y.o., female Today's Date: 03/19/2022  END OF SESSION:  PT End of Session - 03/19/22 1203     Visit Number 2    Date for PT Re-Evaluation 04/30/22    Authorization Type MEDICARE PART A AND B    Progress Note Due on Visit 10    PT Start Time 1202    PT Stop Time 1240    PT Time Calculation (min) 38 min    Behavior During Therapy WFL for tasks assessed/performed             Past Medical History:  Diagnosis Date   Cataract    Mixed form OU   Diabetes mellitus    Fibroid uterus 2001   H/O fatigue 1998   Headache(784.0)    Hirsutism 1998   Idiopathic   History of chicken pox    Hypothyroidism    Menses, irregular 1998   Past Surgical History:  Procedure Laterality Date   DILATION AND CURETTAGE OF UTERUS  2002   In [place]   KNEE SURGERY     TONSILECTOMY, ADENOIDECTOMY, BILATERAL MYRINGOTOMY AND TUBES     TONSILLECTOMY     Patient Active Problem List   Diagnosis Date Noted   Unilateral primary osteoarthritis, left knee 02/27/2022   Bilateral primary osteoarthritis of knee 05/11/2017   Dyspareunia, female 11/13/2011   Type 2 diabetes mellitus ([place]) 11/13/2011   Alopecia 11/13/2011   History of chicken pox    Headache(784.0)    Hypothyroidism    Menses, irregular    Hirsutism    H/O fatigue    Fibroid uterus     PCP: Dixie Dials, MD   REFERRING PROVIDER: Mcarthur Rossetti, MD  REFERRING DIAG: [phone] (ICD-10-CM) - Chronic pain of left knee  THERAPY DIAG:  Muscle weakness (generalized)  Chronic pain of left knee  Difficulty in walking, not elsewhere classified  Rationale for Evaluation and Treatment: Rehabilitation  ONSET DATE: 02/28/2022  SUBJECTIVE:   SUBJECTIVE STATEMENT: Pt reports she is nervous about the pool session; she does not know how to swim and is fearful of drowning.     PERTINENT  HISTORY: Prior surgery right knee PAIN:  Are you having pain? Yes: NPRS scale: 6/10 Pain location: left knee Pain description: aching Aggravating factors: weight bearing, twisting Relieving factors: medication, rest  PRECAUTIONS: None  WEIGHT BEARING RESTRICTIONS: No  FALLS:  Has patient fallen in last 6 months? Yes. Number of falls 1  LIVING ENVIRONMENT: Lives with: lives with their family Lives in: House/apartment Stairs: Yes: Internal: 12 steps; can reach both and External: 5 steps; on left going up Has following equipment at home: None  OCCUPATION: retired  PLOF: Independent, Independent with basic ADLs, Independent with household mobility without device, Independent with community mobility without device, Independent with homemaking with ambulation, Independent with gait, and Independent with transfers  PATIENT GOALS: She would like to get the knee as strong as possible before she has surgery.    NEXT MD VISIT: not scheduled currently  OBJECTIVE:   DIAGNOSTIC FINDINGS: IMPRESSION: 1. Radial tear involving the posterior horn of the medial meniscus at the meniscal root with detachment and medial protrusion of the meniscus. 2. Horizontal cleavage-type tear in the midbody region of the medial meniscus. 3. Severe mucoid degeneration of the ACL. 4. Advanced tricompartmental degenerative changes as described above. 5. Large joint effusion and moderate-sized leaking Baker's cyst.  PATIENT SURVEYS:  FOTO 54 (goal is 54)  COGNITION: Overall cognitive status: Within functional limits for tasks assessed     SENSATION: WFL  POSTURE: No Significant postural limitations  LOWER EXTREMITY ROM:  Active ROM Right eval Left eval  Hip flexion    Hip extension    Hip abduction    Hip adduction    Hip internal rotation    Hip external rotation    Knee flexion  95  Knee extension  -5  Ankle dorsiflexion    Ankle plantarflexion    Ankle inversion    Ankle eversion      (Blank rows = not tested)  LOWER EXTREMITY MMT:  Generally 4- to 4/5 left LE  LOWER EXTREMITY SPECIAL TESTS:  Knee special tests: deferred due to MRI findings  FUNCTIONAL TESTS:  5 times sit to stand: complete next visit Timed up and go (TUG): complete next visit  GAIT: Distance walked: 30 Assistive device utilized: None Level of assistance: Complete Independence Comments: antalgic   TODAY'S TREATMENT:                                                                                                                              DATE: 03/19/22 Pt seen for aquatic therapy today.  Treatment took place in water 3.25-4.5 ft in depth at the [facility]. Temp of water was 91.  Pt entered/exited the pool via stairs with supervision with bilat rail.   Pt requires the buoyancy and hydrostatic pressure of water for support, and to offload joints by unweighting joint load by at least 50 % in navel deep water and by at least 75-80% in chest to neck deep water.  Viscosity of the water is needed for resistance of strengthening. Water current perturbations provides challenge to standing balance requiring increased core activation.   HHA - forward/ backward gait with cues for increased Rt step length;  side stepping R/L Holding wall- heel raises x 10; hip ext x 10; hip abdct x 10; marching x 10; alternating hamstring curls x 10; squats x 10 (cues for technique)  Holding wall with single hand float forward/ backward with SBA Holding wall - side stepping R/L  Hands on rail - forward lunge for knee flexion ROM   PATIENT EDUCATION:  Education details: intro to [employer]  Person educated: Patient Education method: Consulting civil engineer, Media planner, Corporate treasurer cues, Verbal cues, and Handouts Education comprehension: verbalized understanding, returned demonstration, verbal cues required, and tactile cues required  HOME EXERCISE PROGRAM: Access Code: [email or web address] URL:  [email or web address] Date: 03/06/2022 Prepared by: Candyce Churn  Exercises - Supine Quadricep Sets  - 1 x daily - 7 x weekly - 3 sets - 10 reps - Small Range Straight Leg Raise  - 1 x daily - 7 x weekly - 3 sets - 10 reps - Supine Heel Slide  - 1 x daily - 7 x weekly - 3 sets - 10 reps - Seated Knee Extension Stretch with Chair  -  1 x daily - 7 x weekly - 1 sets - 5 reps - as long as you can hold  ASSESSMENT:  CLINICAL IMPRESSION: Pt requires therapist in the water with her at all times due to fear in the water.  She requires UE support on wall or therapist to gain confidence.  With increased time in water, she reported she was less fearful and moved less guarded. She requires cues to increase step length with RLE.  She reported no pain in knees while in water, except with flexing the knees with marching. Goals are ongoing.     OBJECTIVE IMPAIRMENTS: difficulty walking, decreased ROM, decreased strength, increased fascial restrictions, increased muscle spasms, impaired flexibility, and pain.   ACTIVITY LIMITATIONS: carrying, lifting, bending, sitting, standing, squatting, sleeping, stairs, transfers, and dressing  PARTICIPATION LIMITATIONS: meal prep, cleaning, laundry, driving, shopping, community activity, and yard work  PERSONAL FACTORS: Age, Fitness, and Past/current experiences are also affecting patient's functional outcome.   REHAB POTENTIAL: Good  CLINICAL DECISION MAKING: Stable/uncomplicated  EVALUATION COMPLEXITY: Low   GOALS: Goals reviewed with patient? Yes  SHORT TERM GOALS: Target date: 04/02/2022   Pain report to be no greater than 4/10  Baseline: Goal status: INITIAL  2.  Patient will be independent with initial HEP  Baseline:  Goal status: INITIAL   LONG TERM GOALS: Target date: 04/30/2022    Patient to report pain no greater than 2/10  Baseline:  Goal status: INITIAL  2.  Patient to be independent with advanced HEP  Baseline:   Goal status: INITIAL  3.  FOTO score of 63 or better Baseline:  Goal status: INITIAL  4.  Patient to report 50-85% improvement in overall symptoms Baseline:  Goal status: INITIAL  5.  Patient to be able to ascend and descend steps with reciprocal gait safely Baseline:  Goal status: INITIAL   PLAN:  PT FREQUENCY: 2x/week  PT DURATION: 8 weeks  PLANNED INTERVENTIONS: Therapeutic exercises, Therapeutic activity, Neuromuscular re-education, Balance training, Gait training, Patient/Family education, Self Care, Joint mobilization, Stair training, Aquatic Therapy, Dry Needling, Electrical stimulation, Cryotherapy, Moist heat, Splintting, Taping, Vasopneumatic device, Ultrasound, Ionotophoresis '4mg'$ /ml Dexamethasone, Manual therapy, and Re-evaluation  PLAN FOR NEXT SESSION: Review HEP or pool if available, nustep, begin quad rehab  Kerin Perna, PTA 03/19/22 2:02 PM [place] [address] [place], [place], [address] Phone: [phone]   Fax:  [phone]

## 2022-03-25 ENCOUNTER — Ambulatory Visit (HOSPITAL_BASED_OUTPATIENT_CLINIC_OR_DEPARTMENT_OTHER): Payer: Medicare Other | Admitting: Physical Therapy

## 2022-03-25 ENCOUNTER — Encounter (HOSPITAL_BASED_OUTPATIENT_CLINIC_OR_DEPARTMENT_OTHER): Payer: Self-pay | Admitting: Physical Therapy

## 2022-03-25 DIAGNOSIS — R262 Difficulty in walking, not elsewhere classified: Secondary | ICD-10-CM

## 2022-03-25 DIAGNOSIS — M6281 Muscle weakness (generalized): Secondary | ICD-10-CM

## 2022-03-25 DIAGNOSIS — M25562 Pain in left knee: Secondary | ICD-10-CM | POA: Diagnosis not present

## 2022-03-25 DIAGNOSIS — G8929 Other chronic pain: Secondary | ICD-10-CM

## 2022-03-25 NOTE — Therapy (Signed)
OUTPATIENT PHYSICAL THERAPY LOWER EXTREMITY TREATMENT   Patient Name: Krystal Lang MRN: [id number] DOB:1955-10-31, 66 y.o., female Today's Date: 03/25/2022  END OF SESSION:  PT End of Session - 03/25/22 0821     Visit Number 3    Date for PT Re-Evaluation 04/30/22    Authorization Type MEDICARE PART A AND B    Progress Note Due on Visit 10    PT Start Time 0820    PT Stop Time 0858    PT Time Calculation (min) 38 min    Activity Tolerance Patient tolerated treatment well    Behavior During Therapy WFL for tasks assessed/performed             Past Medical History:  Diagnosis Date   Cataract    Mixed form OU   Diabetes mellitus    Fibroid uterus 2001   H/O fatigue 1998   Headache(784.0)    Hirsutism 1998   Idiopathic   History of chicken pox    Hypothyroidism    Menses, irregular 1998   Past Surgical History:  Procedure Laterality Date   DILATION AND CURETTAGE OF UTERUS  2002   In [place]   KNEE SURGERY     TONSILECTOMY, ADENOIDECTOMY, BILATERAL MYRINGOTOMY AND TUBES     TONSILLECTOMY     Patient Active Problem List   Diagnosis Date Noted   Unilateral primary osteoarthritis, left knee 02/27/2022   Bilateral primary osteoarthritis of knee 05/11/2017   Dyspareunia, female 11/13/2011   Type 2 diabetes mellitus ([place]) 11/13/2011   Alopecia 11/13/2011   History of chicken pox    Headache(784.0)    Hypothyroidism    Menses, irregular    Hirsutism    H/O fatigue    Fibroid uterus     PCP: Dixie Dials, MD   REFERRING PROVIDER: Mcarthur Rossetti, MD  REFERRING DIAG: [phone] (ICD-10-CM) - Chronic pain of left knee  THERAPY DIAG:  Muscle weakness (generalized)  Chronic pain of left knee  Difficulty in walking, not elsewhere classified  Rationale for Evaluation and Treatment: Rehabilitation  ONSET DATE: 02/28/2022  SUBJECTIVE:   SUBJECTIVE STATEMENT: Pt reports she "was ok after the session, but that evening and the next day, her  Lt knee was painful".   PERTINENT HISTORY: Prior surgery right knee PAIN:  Are you having pain? Yes: NPRS scale: 7/10 Pain location: left knee Pain description: aching Aggravating factors: weight bearing, twisting Relieving factors: medication, rest  PRECAUTIONS: None  WEIGHT BEARING RESTRICTIONS: No  FALLS:  Has patient fallen in last 6 months? Yes. Number of falls 1  LIVING ENVIRONMENT: Lives with: lives with their family Lives in: House/apartment Stairs: Yes: Internal: 12 steps; can reach both and External: 5 steps; on left going up Has following equipment at home: None  OCCUPATION: retired  PLOF: Independent, Independent with basic ADLs, Independent with household mobility without device, Independent with community mobility without device, Independent with homemaking with ambulation, Independent with gait, and Independent with transfers  PATIENT GOALS: She would like to get the knee as strong as possible before she has surgery.    NEXT MD VISIT: not scheduled currently  OBJECTIVE:   DIAGNOSTIC FINDINGS: IMPRESSION: 1. Radial tear involving the posterior horn of the medial meniscus at the meniscal root with detachment and medial protrusion of the meniscus. 2. Horizontal cleavage-type tear in the midbody region of the medial meniscus. 3. Severe mucoid degeneration of the ACL. 4. Advanced tricompartmental degenerative changes as described above. 5. Large joint effusion and moderate-sized leaking  Baker's cyst.  PATIENT SURVEYS:  FOTO 54 (goal is 43)  COGNITION: Overall cognitive status: Within functional limits for tasks assessed     SENSATION: WFL  POSTURE: No Significant postural limitations  LOWER EXTREMITY ROM:  Active ROM Right eval Left eval  Hip flexion    Hip extension    Hip abduction    Hip adduction    Hip internal rotation    Hip external rotation    Knee flexion  95  Knee extension  -5  Ankle dorsiflexion    Ankle plantarflexion     Ankle inversion    Ankle eversion     (Blank rows = not tested)  LOWER EXTREMITY MMT:  Generally 4- to 4/5 left LE  LOWER EXTREMITY SPECIAL TESTS:  Knee special tests: deferred due to MRI findings  FUNCTIONAL TESTS:  5 times sit to stand: complete next visit Timed up and go (TUG): complete next visit  GAIT: Distance walked: 30 Assistive device utilized: None Level of assistance: Complete Independence Comments: antalgic   TODAY'S TREATMENT:                                                                                                                              DATE: 03/19/22 Pt seen for aquatic therapy today.  Treatment took place in water 3.25-4.5 ft in depth at the [facility]. Temp of water was 91.  Pt entered/exited the pool via stairs with supervision with bilat rail.  HHA - forward/ backward gait with cues for increased Rt step length;  side stepping R/L Holding wall:  marching x 10; alternating hamstring curls x 10; squats x 10 Holding rails of steps:  L/R quad stretch (minimal stretch felt with foot on 2nd step) Holding wall and assistance to place noodle at ankle:  L quad stretch with short blue noodle x 45s x 2; 1 rep on RLE;  solid noodle push down x 10 each LE HHA:  braiding L/R;  forward walking kicks; marching; backward walking    Pt requires the buoyancy and hydrostatic pressure of water for support, and to offload joints by unweighting joint load by at least 50 % in navel deep water and by at least 75-80% in chest to neck deep water.  Viscosity of the water is needed for resistance of strengthening. Water current perturbations provides challenge to standing balance requiring increased core activation.  PATIENT EDUCATION:  Education details: intro to [employer]  Person educated: Patient Education method: Consulting civil engineer, Media planner, Corporate treasurer cues, Verbal cues, and Handouts Education comprehension: verbalized understanding, returned demonstration,  verbal cues required, and tactile cues required  HOME EXERCISE PROGRAM: Access Code: [email or web address] URL: [email or web address] Date: 03/06/2022 Prepared by: Candyce Churn  Exercises - Supine Quadricep Sets  - 1 x daily - 7 x weekly - 3 sets - 10 reps - Small Range Straight Leg Raise  - 1 x daily - 7 x weekly - 3 sets - 10 reps - Supine Heel Slide  -  1 x daily - 7 x weekly - 3 sets - 10 reps - Seated Knee Extension Stretch with Chair  - 1 x daily - 7 x weekly - 1 sets - 5 reps - as long as you can hold  ASSESSMENT:  CLINICAL IMPRESSION: Pt requires therapist in the water with her at all times due to fear in the water.  She requires UE support on wall or therapist to gain confidence.  She is less guarded than last session, but continues to voice fear in water.  She requires minor cues for shift of trunk and weight to Lt during gait trials in water and requires cues to increase step length with RLE.  She reports reduction of pain in Lt knee to 3/10 while in water. Encouraged pt to complete Hamstring and quad stretch on LLE and to do some scar mobilization on her Rt knee incision.  Goals are ongoing.     OBJECTIVE IMPAIRMENTS: difficulty walking, decreased ROM, decreased strength, increased fascial restrictions, increased muscle spasms, impaired flexibility, and pain.   ACTIVITY LIMITATIONS: carrying, lifting, bending, sitting, standing, squatting, sleeping, stairs, transfers, and dressing  PARTICIPATION LIMITATIONS: meal prep, cleaning, laundry, driving, shopping, community activity, and yard work  PERSONAL FACTORS: Age, Fitness, and Past/current experiences are also affecting patient's functional outcome.   REHAB POTENTIAL: Good  CLINICAL DECISION MAKING: Stable/uncomplicated  EVALUATION COMPLEXITY: Low   GOALS: Goals reviewed with patient? Yes  SHORT TERM GOALS: Target date: 04/02/2022   Pain report to be no greater than 4/10  Baseline: Goal status: INITIAL  2.   Patient will be independent with initial HEP  Baseline:  Goal status: INITIAL   LONG TERM GOALS: Target date: 04/30/2022    Patient to report pain no greater than 2/10  Baseline:  Goal status: INITIAL  2.  Patient to be independent with advanced HEP  Baseline:  Goal status: INITIAL  3.  FOTO score of 63 or better Baseline:  Goal status: INITIAL  4.  Patient to report 50-85% improvement in overall symptoms Baseline:  Goal status: INITIAL  5.  Patient to be able to ascend and descend steps with reciprocal gait safely Baseline:  Goal status: INITIAL   PLAN:  PT FREQUENCY: 2x/week  PT DURATION: 8 weeks  PLANNED INTERVENTIONS: Therapeutic exercises, Therapeutic activity, Neuromuscular re-education, Balance training, Gait training, Patient/Family education, Self Care, Joint mobilization, Stair training, Aquatic Therapy, Dry Needling, Electrical stimulation, Cryotherapy, Moist heat, Splintting, Taping, Vasopneumatic device, Ultrasound, Ionotophoresis '4mg'$ /ml Dexamethasone, Manual therapy, and Re-evaluation  PLAN FOR NEXT SESSION: Review HEP or pool if available, nustep, begin quad rehab  Kerin Perna, PTA 03/25/22 1:57 PM [place], [place], [address] Phone: [phone]   Fax:  [phone]

## 2022-03-26 ENCOUNTER — Other Ambulatory Visit: Payer: Self-pay | Admitting: Neurosurgery

## 2022-03-26 DIAGNOSIS — M5416 Radiculopathy, lumbar region: Secondary | ICD-10-CM

## 2022-03-28 ENCOUNTER — Encounter (HOSPITAL_BASED_OUTPATIENT_CLINIC_OR_DEPARTMENT_OTHER): Payer: Self-pay | Admitting: Physical Therapy

## 2022-03-28 ENCOUNTER — Ambulatory Visit (HOSPITAL_BASED_OUTPATIENT_CLINIC_OR_DEPARTMENT_OTHER): Payer: Medicare Other | Admitting: Physical Therapy

## 2022-03-28 DIAGNOSIS — M6281 Muscle weakness (generalized): Secondary | ICD-10-CM

## 2022-03-28 DIAGNOSIS — M25562 Pain in left knee: Secondary | ICD-10-CM | POA: Diagnosis not present

## 2022-03-28 DIAGNOSIS — G8929 Other chronic pain: Secondary | ICD-10-CM

## 2022-03-28 DIAGNOSIS — R262 Difficulty in walking, not elsewhere classified: Secondary | ICD-10-CM

## 2022-03-28 NOTE — Therapy (Signed)
OUTPATIENT PHYSICAL THERAPY LOWER EXTREMITY TREATMENT   Patient Name: Krystal Lang MRN: [id number] DOB:1955/05/09, 66 y.o., female Today's Date: 03/28/2022  END OF SESSION:  PT End of Session - 03/28/22 1349     Visit Number 4    Date for PT Re-Evaluation 04/30/22    Authorization Type MEDICARE PART A AND B    PT Start Time 1347    PT Stop Time 1430    PT Time Calculation (min) 43 min    Behavior During Therapy WFL for tasks assessed/performed             Past Medical History:  Diagnosis Date   Cataract    Mixed form OU   Diabetes mellitus    Fibroid uterus 2001   H/O fatigue 1998   Headache(784.0)    Hirsutism 1998   Idiopathic   History of chicken pox    Hypothyroidism    Menses, irregular 1998   Past Surgical History:  Procedure Laterality Date   DILATION AND CURETTAGE OF UTERUS  2002   In [place]   KNEE SURGERY     TONSILECTOMY, ADENOIDECTOMY, BILATERAL MYRINGOTOMY AND TUBES     TONSILLECTOMY     Patient Active Problem List   Diagnosis Date Noted   Unilateral primary osteoarthritis, left knee 02/27/2022   Bilateral primary osteoarthritis of knee 05/11/2017   Dyspareunia, female 11/13/2011   Type 2 diabetes mellitus ([place]) 11/13/2011   Alopecia 11/13/2011   History of chicken pox    Headache(784.0)    Hypothyroidism    Menses, irregular    Hirsutism    H/O fatigue    Fibroid uterus     PCP: Dixie Dials, MD   REFERRING PROVIDER: Mcarthur Rossetti, MD  REFERRING DIAG: [id number] (ICD-10-CM) - Chronic pain of left knee  THERAPY DIAG:  Muscle weakness (generalized)  Chronic pain of left knee  Difficulty in walking, not elsewhere classified  Rationale for Evaluation and Treatment: Rehabilitation  ONSET DATE: 02/28/2022  SUBJECTIVE:   SUBJECTIVE STATEMENT: Pt reports she "was less sore and much less tired than the first time".    PERTINENT HISTORY: Prior surgery right knee PAIN:  Are you having pain? Yes: NPRS scale:  4/10 Pain location: left knee Pain description: aching Aggravating factors: weight bearing, twisting Relieving factors: medication, rest  PRECAUTIONS: None  WEIGHT BEARING RESTRICTIONS: No  FALLS:  Has patient fallen in last 6 months? Yes. Number of falls 1  LIVING ENVIRONMENT: Lives with: lives with their family Lives in: House/apartment Stairs: Yes: Internal: 12 steps; can reach both and External: 5 steps; on left going up Has following equipment at home: None  OCCUPATION: retired  PLOF: Independent, Independent with basic ADLs, Independent with household mobility without device, Independent with community mobility without device, Independent with homemaking with ambulation, Independent with gait, and Independent with transfers  PATIENT GOALS: She would like to get the knee as strong as possible before she has surgery.    NEXT MD VISIT: not scheduled currently  OBJECTIVE:   DIAGNOSTIC FINDINGS: IMPRESSION: 1. Radial tear involving the posterior horn of the medial meniscus at the meniscal root with detachment and medial protrusion of the meniscus. 2. Horizontal cleavage-type tear in the midbody region of the medial meniscus. 3. Severe mucoid degeneration of the ACL. 4. Advanced tricompartmental degenerative changes as described above. 5. Large joint effusion and moderate-sized leaking Baker's cyst.  PATIENT SURVEYS:  FOTO 54 (goal is 66)  COGNITION: Overall cognitive status: Within functional limits for tasks assessed  SENSATION: WFL  POSTURE: No Significant postural limitations  LOWER EXTREMITY ROM:  Active ROM Right eval Left eval  Hip flexion    Hip extension    Hip abduction    Hip adduction    Hip internal rotation    Hip external rotation    Knee flexion  95  Knee extension  -5  Ankle dorsiflexion    Ankle plantarflexion    Ankle inversion    Ankle eversion     (Blank rows = not tested)  LOWER EXTREMITY MMT:  Generally 4- to 4/5 left  LE  LOWER EXTREMITY SPECIAL TESTS:  Knee special tests: deferred due to MRI findings  FUNCTIONAL TESTS:  5 times sit to stand: complete next visit Timed up and go (TUG): complete next visit  GAIT: Distance walked: 30 Assistive device utilized: None Level of assistance: Complete Independence Comments: antalgic   TODAY'S TREATMENT:                                                                                                                              DATE: 03/28/22 Pt seen for aquatic therapy today.  Treatment took place in water 3.25-4.5 ft in depth at the [facility]. Temp of water was 91.  Pt entered/exited the pool via stairs with supervision with bilat rail.  Holding wall: high knee marching;  heel raises x 10; hip ext x 10; hip abdct x 10; squats x 10 HHA and progressed to holding noodle/ barbell with CGA to floatation device: forward/ backward gait with cues for increased Rt step length;  side stepping R/L STS from bench in water with feet on bench and HHA x 10 - progressing from MinA to steady when up to light CGA Holding wall and assistance to place noodle at ankle:  L quad stretch with short blue noodle x 30s x 2; 1 rep on RLE HHA:  braiding L/R;  forward walking kicks; marching Once dried off:  reg Rock tape placed over incision of Rt knee in zig zag pattern to assist with desensitization and scar management; I strip of tape place on medial and lateral Lt knee at joint line with 25% stretch to increase proprioception and decompress tissue.     Pt requires the buoyancy and hydrostatic pressure of water for support, and to offload joints by unweighting joint load by at least 50 % in navel deep water and by at least 75-80% in chest to neck deep water.  Viscosity of the water is needed for resistance of strengthening. Water current perturbations provides challenge to standing balance requiring increased core activation.  PATIENT EDUCATION:  Education details:  intro to [employer]  Person educated: Patient Education method: Consulting civil engineer, Media planner, Corporate treasurer cues, Verbal cues, and Handouts Education comprehension: verbalized understanding, returned demonstration, verbal cues required, and tactile cues required  HOME EXERCISE PROGRAM: Access Code: [email or web address] URL: [email or web address] Date: 03/06/2022 Prepared by: Candyce Churn  Exercises - Supine Quadricep Sets  - 1 x  daily - 7 x weekly - 3 sets - 10 reps - Small Range Straight Leg Raise  - 1 x daily - 7 x weekly - 3 sets - 10 reps - Supine Heel Slide  - 1 x daily - 7 x weekly - 3 sets - 10 reps - Seated Knee Extension Stretch with Chair  - 1 x daily - 7 x weekly - 1 sets - 5 reps - as long as you can hold  ASSESSMENT:  CLINICAL IMPRESSION: Pt requires therapist in the water with her at all times due to fear in the water.  Pt was able to progress from HHA to light CGA (to floatation object) when pt away from wall.  She was able to tolerate quad stretch better today than last visit.   She requires minor cues for shift of trunk and weight to Lt during gait trials in water. Improved Lt step length today.  She reports reduction of pain in Lt knee to 0/10 while in water. Pt progressing gradually towards LTGs.  Pt given verbal instructions of how to safely remove Rock tape.   OBJECTIVE IMPAIRMENTS: difficulty walking, decreased ROM, decreased strength, increased fascial restrictions, increased muscle spasms, impaired flexibility, and pain.   ACTIVITY LIMITATIONS: carrying, lifting, bending, sitting, standing, squatting, sleeping, stairs, transfers, and dressing  PARTICIPATION LIMITATIONS: meal prep, cleaning, laundry, driving, shopping, community activity, and yard work  PERSONAL FACTORS: Age, Fitness, and Past/current experiences are also affecting patient's functional outcome.   REHAB POTENTIAL: Good  CLINICAL DECISION MAKING: Stable/uncomplicated  EVALUATION COMPLEXITY:  Low   GOALS: Goals reviewed with patient? Yes  SHORT TERM GOALS: Target date: 04/02/2022   Pain report to be no greater than 4/10  Baseline: Goal status: INITIAL  2.  Patient will be independent with initial HEP  Baseline:  Goal status: INITIAL   LONG TERM GOALS: Target date: 04/30/2022    Patient to report pain no greater than 2/10  Baseline:  Goal status: INITIAL  2.  Patient to be independent with advanced HEP  Baseline:  Goal status: INITIAL  3.  FOTO score of 63 or better Baseline:  Goal status: INITIAL  4.  Patient to report 50-85% improvement in overall symptoms Baseline:  Goal status: INITIAL  5.  Patient to be able to ascend and descend steps with reciprocal gait safely Baseline:  Goal status: INITIAL   PLAN:  PT FREQUENCY: 2x/week  PT DURATION: 8 weeks  PLANNED INTERVENTIONS: Therapeutic exercises, Therapeutic activity, Neuromuscular re-education, Balance training, Gait training, Patient/Family education, Self Care, Joint mobilization, Stair training, Aquatic Therapy, Dry Needling, Electrical stimulation, Cryotherapy, Moist heat, Splintting, Taping, Vasopneumatic device, Ultrasound, Ionotophoresis '4mg'$ /ml Dexamethasone, Manual therapy, and Re-evaluation  PLAN FOR NEXT SESSION: assess response to rock tape and retape if helpful. Stairs in water.   Kerin Perna, PTA 03/28/22 3:11 PM [facility] [address] [place], [place], [address] Phone: [phone]   Fax:  [phone]

## 2022-04-01 ENCOUNTER — Ambulatory Visit (HOSPITAL_BASED_OUTPATIENT_CLINIC_OR_DEPARTMENT_OTHER): Payer: Medicare Other | Attending: Orthopaedic Surgery | Admitting: Physical Therapy

## 2022-04-01 ENCOUNTER — Encounter (HOSPITAL_BASED_OUTPATIENT_CLINIC_OR_DEPARTMENT_OTHER): Payer: Self-pay | Admitting: Physical Therapy

## 2022-04-01 DIAGNOSIS — R262 Difficulty in walking, not elsewhere classified: Secondary | ICD-10-CM | POA: Insufficient documentation

## 2022-04-01 DIAGNOSIS — G8929 Other chronic pain: Secondary | ICD-10-CM | POA: Insufficient documentation

## 2022-04-01 DIAGNOSIS — R252 Cramp and spasm: Secondary | ICD-10-CM | POA: Diagnosis present

## 2022-04-01 DIAGNOSIS — M6281 Muscle weakness (generalized): Secondary | ICD-10-CM | POA: Insufficient documentation

## 2022-04-01 DIAGNOSIS — M25562 Pain in left knee: Secondary | ICD-10-CM | POA: Diagnosis present

## 2022-04-01 NOTE — Therapy (Signed)
OUTPATIENT PHYSICAL THERAPY LOWER EXTREMITY TREATMENT   Patient Name: Krystal Lang MRN: [id number] DOB:November 02, 1955, 67 y.o., female Today's Date: 04/01/2022  END OF SESSION:  PT End of Session - 04/01/22 1557     Visit Number 5    Date for PT Re-Evaluation 04/30/22    Authorization Type MEDICARE PART A AND B    PT Start Time 1537   pt arrived late to pool area   PT Stop Time 1622    PT Time Calculation (min) 45 min    Activity Tolerance Patient tolerated treatment well    Behavior During Therapy WFL for tasks assessed/performed             Past Medical History:  Diagnosis Date   Cataract    Mixed form OU   Diabetes mellitus    Fibroid uterus 2001   H/O fatigue 1998   Headache(784.0)    Hirsutism 1998   Idiopathic   History of chicken pox    Hypothyroidism    Menses, irregular 1998   Past Surgical History:  Procedure Laterality Date   DILATION AND CURETTAGE OF UTERUS  2002   In [place]   KNEE SURGERY     TONSILECTOMY, ADENOIDECTOMY, BILATERAL MYRINGOTOMY AND TUBES     TONSILLECTOMY     Patient Active Problem List   Diagnosis Date Noted   Unilateral primary osteoarthritis, left knee 02/27/2022   Bilateral primary osteoarthritis of knee 05/11/2017   Dyspareunia, female 11/13/2011   Type 2 diabetes mellitus ([place]) 11/13/2011   Alopecia 11/13/2011   History of chicken pox    Headache(784.0)    Hypothyroidism    Menses, irregular    Hirsutism    H/O fatigue    Fibroid uterus     PCP: Dixie Dials, MD   REFERRING PROVIDER: Mcarthur Rossetti, MD  REFERRING DIAG: [phone] (ICD-10-CM) - Chronic pain of left knee  THERAPY DIAG:  Muscle weakness (generalized)  Chronic pain of left knee  Difficulty in walking, not elsewhere classified  Cramp and spasm  Rationale for Evaluation and Treatment: Rehabilitation  ONSET DATE: 02/28/2022  SUBJECTIVE:   SUBJECTIVE STATEMENT: Pt reports that she did not notice a difference in Lt knee with the  tape.  "The tape on Rt knee, it helped with the scar tissue".  She reports it was difficult to remove; left in on until Monday.   PERTINENT HISTORY: Prior surgery right knee PAIN:  Are you having pain? Yes: NPRS scale: 5-6/10 Pain location: left/Right knee Pain description: aching Aggravating factors: weight bearing, twisting Relieving factors: medication, rest  PRECAUTIONS: None  WEIGHT BEARING RESTRICTIONS: No  FALLS:  Has patient fallen in last 6 months? Yes. Number of falls 1  LIVING ENVIRONMENT: Lives with: lives with their family Lives in: House/apartment Stairs: Yes: Internal: 12 steps; can reach both and External: 5 steps; on left going up Has following equipment at home: None  OCCUPATION: retired  PLOF: Independent, Independent with basic ADLs, Independent with household mobility without device, Independent with community mobility without device, Independent with homemaking with ambulation, Independent with gait, and Independent with transfers  PATIENT GOALS: She would like to get the knee as strong as possible before she has surgery.    NEXT MD VISIT: not scheduled currently  OBJECTIVE:   DIAGNOSTIC FINDINGS: IMPRESSION: 1. Radial tear involving the posterior horn of the medial meniscus at the meniscal root with detachment and medial protrusion of the meniscus. 2. Horizontal cleavage-type tear in the midbody region of the medial meniscus.  3. Severe mucoid degeneration of the ACL. 4. Advanced tricompartmental degenerative changes as described above. 5. Large joint effusion and moderate-sized leaking Baker's cyst.  PATIENT SURVEYS:  FOTO 54 (goal is 61)  COGNITION: Overall cognitive status: Within functional limits for tasks assessed     SENSATION: WFL  POSTURE: No Significant postural limitations  LOWER EXTREMITY ROM:  Active ROM Right eval Left eval  Hip flexion    Hip extension    Hip abduction    Hip adduction    Hip internal rotation     Hip external rotation    Knee flexion  95  Knee extension  -5  Ankle dorsiflexion    Ankle plantarflexion    Ankle inversion    Ankle eversion     (Blank rows = not tested)  LOWER EXTREMITY MMT:  Generally 4- to 4/5 left LE  LOWER EXTREMITY SPECIAL TESTS:  Knee special tests: deferred due to MRI findings  FUNCTIONAL TESTS:  5 times sit to stand: complete next visit Timed up and go (TUG): complete next visit  GAIT: Distance walked: 30 Assistive device utilized: None Level of assistance: Complete Independence Comments: antalgic   TODAY'S TREATMENT:                                                                                                                              DATE: 04/01/22 Pt seen for aquatic therapy today.  Treatment took place in water 3.25-4.5 ft in depth at the [facility]. Temp of water was 91.  Pt entered/exited the pool via stairs with supervision with bilat rail.  Holding barbell with CGA to floatation device: forward/ backward gait with cues for increased Lt step length;  side stepping R/L; side step squat R/L  Holding wall:  heel raises x 20; hip abdct/ add x 10; hip ext x 10; hamstring curls x 10;  Forward step ups (blue step, holding wall by lift chair) x 10 each LE; R/L lateral step ups x 10 each; R forward step down, Lt retro step upx 5 ( improved tolerance with repetition)  Holding wall and assistance to place noodle at ankle:  quad stretch with short blue noodle x 30s x 3, with forward lean for standing hamstring stretch, straightening supported leg   Pt requires the buoyancy and hydrostatic pressure of water for support, and to offload joints by unweighting joint load by at least 50 % in navel deep water and by at least 75-80% in chest to neck deep water.  Viscosity of the water is needed for resistance of strengthening. Water current perturbations provides challenge to standing balance requiring increased core activation.  PATIENT  EDUCATION:  Education details: intro to [employer]  Person educated: Patient Education method: Consulting civil engineer, Media planner, Corporate treasurer cues, Verbal cues, and Handouts Education comprehension: verbalized understanding, returned demonstration, verbal cues required, and tactile cues required  HOME EXERCISE PROGRAM: Access Code: [email or web address] URL: [email or web address] Date: 03/06/2022 Prepared by: Candyce Churn  Exercises -  Supine Quadricep Sets  - 1 x daily - 7 x weekly - 3 sets - 10 reps - Small Range Straight Leg Raise  - 1 x daily - 7 x weekly - 3 sets - 10 reps - Supine Heel Slide  - 1 x daily - 7 x weekly - 3 sets - 10 reps - Seated Knee Extension Stretch with Chair  - 1 x daily - 7 x weekly - 1 sets - 5 reps - as long as you can hold  ASSESSMENT:  CLINICAL IMPRESSION: Pt requires therapist in the water with her at all times due to fear in the water.  Pt is still guarded when initially in water, and verbalized continued fear of losing balance in water.  She requires CGA to floatation device when away from wall  She was able to tolerate quad stretch better today than last visit.   She requires minor cues for shift of trunk and weight to Lt and take longer steps with LLE during gait trials in water.  She reports reduction of pain in both knees to minimal while in water. Pt progressing gradually towards LTGs.  No tape applied to knees due to upcoming Dr appt. Advised her to hold off on resistance machines for LE until observed by PT on form and tolerance.   OBJECTIVE IMPAIRMENTS: difficulty walking, decreased ROM, decreased strength, increased fascial restrictions, increased muscle spasms, impaired flexibility, and pain.   ACTIVITY LIMITATIONS: carrying, lifting, bending, sitting, standing, squatting, sleeping, stairs, transfers, and dressing  PARTICIPATION LIMITATIONS: meal prep, cleaning, laundry, driving, shopping, community activity, and yard work  PERSONAL FACTORS: Age,  Fitness, and Past/current experiences are also affecting patient's functional outcome.   REHAB POTENTIAL: Good  CLINICAL DECISION MAKING: Stable/uncomplicated  EVALUATION COMPLEXITY: Low   GOALS: Goals reviewed with patient? Yes  SHORT TERM GOALS: Target date: 04/02/2022   Pain report to be no greater than 4/10  Baseline: Goal status: INITIAL  2.  Patient will be independent with initial HEP  Baseline:  Goal status: INITIAL   LONG TERM GOALS: Target date: 04/30/2022    Patient to report pain no greater than 2/10  Baseline:  Goal status: INITIAL  2.  Patient to be independent with advanced HEP  Baseline:  Goal status: INITIAL  3.  FOTO score of 63 or better Baseline:  Goal status: INITIAL  4.  Patient to report 50-85% improvement in overall symptoms Baseline:  Goal status: INITIAL  5.  Patient to be able to ascend and descend steps with reciprocal gait safely Baseline:  Goal status: INITIAL   PLAN:  PT FREQUENCY: 2x/week  PT DURATION: 8 weeks  PLANNED INTERVENTIONS: Therapeutic exercises, Therapeutic activity, Neuromuscular re-education, Balance training, Gait training, Patient/Family education, Self Care, Joint mobilization, Stair training, Aquatic Therapy, Dry Needling, Electrical stimulation, Cryotherapy, Moist heat, Splintting, Taping, Vasopneumatic device, Ultrasound, Ionotophoresis '4mg'$ /ml Dexamethasone, Manual therapy, and Re-evaluation  PLAN FOR NEXT SESSION: retape Rt knee at incision if helpful. Continue stairs in water.   Kerin Perna, PTA 04/01/22 4:34 PM [facility] [address] [place], [place], [address] Phone: [phone]   Fax:  [phone]

## 2022-04-04 ENCOUNTER — Ambulatory Visit (HOSPITAL_BASED_OUTPATIENT_CLINIC_OR_DEPARTMENT_OTHER): Payer: Medicare Other | Admitting: Physical Therapy

## 2022-04-04 ENCOUNTER — Encounter (HOSPITAL_BASED_OUTPATIENT_CLINIC_OR_DEPARTMENT_OTHER): Payer: Self-pay | Admitting: Physical Therapy

## 2022-04-04 DIAGNOSIS — R262 Difficulty in walking, not elsewhere classified: Secondary | ICD-10-CM

## 2022-04-04 DIAGNOSIS — R252 Cramp and spasm: Secondary | ICD-10-CM

## 2022-04-04 DIAGNOSIS — M6281 Muscle weakness (generalized): Secondary | ICD-10-CM

## 2022-04-04 DIAGNOSIS — G8929 Other chronic pain: Secondary | ICD-10-CM

## 2022-04-04 NOTE — Therapy (Signed)
OUTPATIENT PHYSICAL THERAPY LOWER EXTREMITY TREATMENT   Patient Name: Krystal Lang MRN: [id number] DOB:1956-01-27, 67 y.o., female Today's Date: 04/04/2022  END OF SESSION:  PT End of Session - 04/04/22 1304     Visit Number 6    Date for PT Re-Evaluation 04/30/22    Authorization Type MEDICARE PART A AND B    Progress Note Due on Visit 10    PT Start Time 1259    PT Stop Time 1340    PT Time Calculation (min) 41 min    Activity Tolerance Patient tolerated treatment well    Behavior During Therapy WFL for tasks assessed/performed             Past Medical History:  Diagnosis Date   Cataract    Mixed form OU   Diabetes mellitus    Fibroid uterus 2001   H/O fatigue 1998   Headache(784.0)    Hirsutism 1998   Idiopathic   History of chicken pox    Hypothyroidism    Menses, irregular 1998   Past Surgical History:  Procedure Laterality Date   DILATION AND CURETTAGE OF UTERUS  2002   In [place]   KNEE SURGERY     TONSILECTOMY, ADENOIDECTOMY, BILATERAL MYRINGOTOMY AND TUBES     TONSILLECTOMY     Patient Active Problem List   Diagnosis Date Noted   Unilateral primary osteoarthritis, left knee 02/27/2022   Bilateral primary osteoarthritis of knee 05/11/2017   Dyspareunia, female 11/13/2011   Type 2 diabetes mellitus ([place]) 11/13/2011   Alopecia 11/13/2011   History of chicken pox    Headache(784.0)    Hypothyroidism    Menses, irregular    Hirsutism    H/O fatigue    Fibroid uterus     PCP: Dixie Dials, MD   REFERRING PROVIDER: Mcarthur Rossetti, MD  REFERRING DIAG: [phone] (ICD-10-CM) - Chronic pain of left knee  THERAPY DIAG:  Muscle weakness (generalized)  Chronic pain of left knee  Difficulty in walking, not elsewhere classified  Cramp and spasm  Rationale for Evaluation and Treatment: Rehabilitation  ONSET DATE: 02/28/2022  SUBJECTIVE:   SUBJECTIVE STATEMENT: Pt reports that she saw dr that performed her Rt knee surgery.   "He told me it would take time for the pain to go away".    PERTINENT HISTORY: Prior surgery right knee PAIN:  Are you having pain? Yes: NPRS scale: 3/10 on RLE, 5/10 on LLE Pain location: knees Pain description: aching Aggravating factors: weight bearing, twisting Relieving factors: medication, rest  PRECAUTIONS: None  WEIGHT BEARING RESTRICTIONS: No  FALLS:  Has patient fallen in last 6 months? Yes. Number of falls 1  LIVING ENVIRONMENT: Lives with: lives with their family Lives in: House/apartment Stairs: Yes: Internal: 12 steps; can reach both and External: 5 steps; on left going up Has following equipment at home: None  OCCUPATION: retired  PLOF: Independent, Independent with basic ADLs, Independent with household mobility without device, Independent with community mobility without device, Independent with homemaking with ambulation, Independent with gait, and Independent with transfers  PATIENT GOALS: She would like to get the knee as strong as possible before she has surgery.    NEXT MD VISIT: not scheduled currently  OBJECTIVE:   DIAGNOSTIC FINDINGS: IMPRESSION: 1. Radial tear involving the posterior horn of the medial meniscus at the meniscal root with detachment and medial protrusion of the meniscus. 2. Horizontal cleavage-type tear in the midbody region of the medial meniscus. 3. Severe mucoid degeneration of the ACL.  4. Advanced tricompartmental degenerative changes as described above. 5. Large joint effusion and moderate-sized leaking Baker's cyst.  PATIENT SURVEYS:  FOTO 54 (goal is 67)  COGNITION: Overall cognitive status: Within functional limits for tasks assessed     SENSATION: WFL  POSTURE: No Significant postural limitations  LOWER EXTREMITY ROM:  Active ROM Right eval Left eval  Hip flexion    Hip extension    Hip abduction    Hip adduction    Hip internal rotation    Hip external rotation    Knee flexion  95  Knee extension  -5   Ankle dorsiflexion    Ankle plantarflexion    Ankle inversion    Ankle eversion     (Blank rows = not tested)  LOWER EXTREMITY MMT:  Generally 4- to 4/5 left LE  LOWER EXTREMITY SPECIAL TESTS:  Knee special tests: deferred due to MRI findings  FUNCTIONAL TESTS:  5 times sit to stand: complete next visit Timed up and go (TUG): complete next visit  GAIT: Distance walked: 30 Assistive device utilized: None Level of assistance: Complete Independence Comments: antalgic   TODAY'S TREATMENT:                                                                                                                              DATE: 04/04/22 Pt seen for aquatic therapy today.  Treatment took place in water 3.25-4.5 ft in depth at the [facility]. Temp of water was 91.  Pt entered/exited the pool via stairs with supervision with bilat rail.  Holding wall:  hip abdct/add x 10 x 2 each; bilat heel raisesx 15; hip ext to toe tap x 10 each; side stepping R/L;  SLS with light intermittent UE support on wall up, multiple reps from 10s to 50s on each Forward step up/ retro step down with L / R x 10 each, BUE on rails; side step up/down x 8 each STS on bench with feet on blue step with Min A to steady progressing to SBA (x 10)  Holding barbell with CGA to floatation device/SBA: forward/ backward gait;   side stepping R/L; side step squat R/L  Holding wall and assistance to place noodle at ankle: R/L  quad stretch with thin squoodle x 30s x 2,  Return to walking forward/ backward with barbell  After dried off: applied reg Rock tape in zig zag pattern over Rt knee incision for desensitization and scar mobilization.   Pt requires the buoyancy and hydrostatic pressure of water for support, and to offload joints by unweighting joint load by at least 50 % in navel deep water and by at least 75-80% in chest to neck deep water.  Viscosity of the water is needed for resistance of strengthening. Water  current perturbations provides challenge to standing balance requiring increased core activation.  PATIENT EDUCATION:  Education details: intro to [employer]  Person educated: Patient Education method: Explanation, Media planner, Corporate treasurer cues, Verbal cues,  and Handouts Education comprehension: verbalized understanding, returned demonstration, verbal cues required, and tactile cues required  HOME EXERCISE PROGRAM: Access Code: [email or web address] URL: [email or web address] Date: 03/06/2022 Prepared by: Candyce Churn  Exercises - Supine Quadricep Sets  - 1 x daily - 7 x weekly - 3 sets - 10 reps - Small Range Straight Leg Raise  - 1 x daily - 7 x weekly - 3 sets - 10 reps - Supine Heel Slide  - 1 x daily - 7 x weekly - 3 sets - 10 reps - Seated Knee Extension Stretch with Chair  - 1 x daily - 7 x weekly - 1 sets - 5 reps - as long as you can hold  ASSESSMENT:  CLINICAL IMPRESSION: Pt requires therapist in the water with her at all times due to fear in the water. Pt able to complete gait with CGA/SBA (with therapist in water) today and able to complete exercises at wall with therapist on deck.   She was able to tolerate quad stretch with thin sqoodle (more buoyancy), however noodle briefly slipped on LLE and increased her stretch past her comfort level. Therapist provided brief STM to area on Lt knee to ease the pain.  She requires minor cues for shift of trunk to Lt.  She reports reduction of pain in both knees to minimal while in water. Pt progressing gradually towards LTGs.  Tape applied to only Rt incision pre pat request. May benefit from IASTM to L ITB, distal quad and hamstrings.    OBJECTIVE IMPAIRMENTS: difficulty walking, decreased ROM, decreased strength, increased fascial restrictions, increased muscle spasms, impaired flexibility, and pain.   ACTIVITY LIMITATIONS: carrying, lifting, bending, sitting, standing, squatting, sleeping, stairs, transfers, and  dressing  PARTICIPATION LIMITATIONS: meal prep, cleaning, laundry, driving, shopping, community activity, and yard work  PERSONAL FACTORS: Age, Fitness, and Past/current experiences are also affecting patient's functional outcome.   REHAB POTENTIAL: Good  CLINICAL DECISION MAKING: Stable/uncomplicated  EVALUATION COMPLEXITY: Low   GOALS: Goals reviewed with patient? Yes  SHORT TERM GOALS: Target date: 04/02/2022   Pain report to be no greater than 4/10  Baseline: Goal status: INITIAL  2.  Patient will be independent with initial HEP  Baseline:  Goal status: INITIAL   LONG TERM GOALS: Target date: 04/30/2022    Patient to report pain no greater than 2/10  Baseline:  Goal status: INITIAL  2.  Patient to be independent with advanced HEP  Baseline:  Goal status: INITIAL  3.  FOTO score of 63 or better Baseline:  Goal status: INITIAL  4.  Patient to report 50-85% improvement in overall symptoms Baseline:  Goal status: INITIAL  5.  Patient to be able to ascend and descend steps with reciprocal gait safely Baseline:  Goal status: INITIAL   PLAN:  PT FREQUENCY: 2x/week  PT DURATION: 8 weeks  PLANNED INTERVENTIONS: Therapeutic exercises, Therapeutic activity, Neuromuscular re-education, Balance training, Gait training, Patient/Family education, Self Care, Joint mobilization, Stair training, Aquatic Therapy, Dry Needling, Electrical stimulation, Cryotherapy, Moist heat, Splintting, Taping, Vasopneumatic device, Ultrasound, Ionotophoresis '4mg'$ /ml Dexamethasone, Manual therapy, and Re-evaluation  PLAN FOR NEXT SESSION: Continue stairs in water.   Kerin Perna, PTA 04/04/22 1:05 PM [facility] [address] [place], [place], [address] Phone: [phone]   Fax:  [phone]

## 2022-04-07 ENCOUNTER — Encounter (HOSPITAL_BASED_OUTPATIENT_CLINIC_OR_DEPARTMENT_OTHER): Payer: Self-pay | Admitting: Physical Therapy

## 2022-04-07 ENCOUNTER — Ambulatory Visit (HOSPITAL_BASED_OUTPATIENT_CLINIC_OR_DEPARTMENT_OTHER): Payer: Medicare Other | Admitting: Physical Therapy

## 2022-04-07 DIAGNOSIS — M6281 Muscle weakness (generalized): Secondary | ICD-10-CM | POA: Diagnosis not present

## 2022-04-07 DIAGNOSIS — G8929 Other chronic pain: Secondary | ICD-10-CM

## 2022-04-07 DIAGNOSIS — R252 Cramp and spasm: Secondary | ICD-10-CM

## 2022-04-07 DIAGNOSIS — R262 Difficulty in walking, not elsewhere classified: Secondary | ICD-10-CM

## 2022-04-07 NOTE — Therapy (Signed)
OUTPATIENT PHYSICAL THERAPY LOWER EXTREMITY TREATMENT   Patient Name: Krystal Lang MRN: [id number] DOB:1955-05-10, 67 y.o., female Today's Date: 04/07/2022  END OF SESSION:  PT End of Session - 04/07/22 1109     Visit Number 7    Date for PT Re-Evaluation 04/30/22    Authorization Type MEDICARE PART A AND B    Progress Note Due on Visit 10    PT Start Time 1025    PT Stop Time 1108    PT Time Calculation (min) 43 min    Activity Tolerance Patient tolerated treatment well    Behavior During Therapy WFL for tasks assessed/performed             Past Medical History:  Diagnosis Date   Cataract    Mixed form OU   Diabetes mellitus    Fibroid uterus 2001   H/O fatigue 1998   Headache(784.0)    Hirsutism 1998   Idiopathic   History of chicken pox    Hypothyroidism    Menses, irregular 1998   Past Surgical History:  Procedure Laterality Date   DILATION AND CURETTAGE OF UTERUS  2002   In [place]   KNEE SURGERY     TONSILECTOMY, ADENOIDECTOMY, BILATERAL MYRINGOTOMY AND TUBES     TONSILLECTOMY     Patient Active Problem List   Diagnosis Date Noted   Unilateral primary osteoarthritis, left knee 02/27/2022   Bilateral primary osteoarthritis of knee 05/11/2017   Dyspareunia, female 11/13/2011   Type 2 diabetes mellitus ([place]) 11/13/2011   Alopecia 11/13/2011   History of chicken pox    Headache(784.0)    Hypothyroidism    Menses, irregular    Hirsutism    H/O fatigue    Fibroid uterus     PCP: Dixie Dials, MD   REFERRING PROVIDER: Mcarthur Rossetti, MD  REFERRING DIAG: [phone] (ICD-10-CM) - Chronic pain of left knee  THERAPY DIAG:  Muscle weakness (generalized)  Chronic pain of left knee  Difficulty in walking, not elsewhere classified  Cramp and spasm  Rationale for Evaluation and Treatment: Rehabilitation  ONSET DATE: 02/28/2022  SUBJECTIVE:   SUBJECTIVE STATEMENT: "My pain has not gone away yet."  Pt reports that her Lt knee  has had increased pain over the weekend, especially with stairs and with standing for an hour while in the kitchen.     PERTINENT HISTORY: Prior surgery right knee PAIN:  Are you having pain? Yes: NPRS scale: 5/10 Pain location: knees Pain description: aching Aggravating factors: weight bearing, twisting Relieving factors: medication, rest  PRECAUTIONS: None  WEIGHT BEARING RESTRICTIONS: No  FALLS:  Has patient fallen in last 6 months? Yes. Number of falls 1  LIVING ENVIRONMENT: Lives with: lives with their family Lives in: House/apartment Stairs: Yes: Internal: 12 steps; can reach both and External: 5 steps; on left going up Has following equipment at home: None  OCCUPATION: retired  PLOF: Independent, Independent with basic ADLs, Independent with household mobility without device, Independent with community mobility without device, Independent with homemaking with ambulation, Independent with gait, and Independent with transfers  PATIENT GOALS: She would like to get the knee as strong as possible before she has surgery.    NEXT MD VISIT: not scheduled currently  OBJECTIVE:   DIAGNOSTIC FINDINGS: IMPRESSION: 1. Radial tear involving the posterior horn of the medial meniscus at the meniscal root with detachment and medial protrusion of the meniscus. 2. Horizontal cleavage-type tear in the midbody region of the medial meniscus. 3. Severe mucoid  degeneration of the ACL. 4. Advanced tricompartmental degenerative changes as described above. 5. Large joint effusion and moderate-sized leaking Baker's cyst.  PATIENT SURVEYS:  FOTO 54 (goal is 22)  COGNITION: Overall cognitive status: Within functional limits for tasks assessed     SENSATION: WFL  POSTURE: No Significant postural limitations  LOWER EXTREMITY ROM:  Active ROM Right eval Left eval  Hip flexion    Hip extension    Hip abduction    Hip adduction    Hip internal rotation    Hip external rotation     Knee flexion  95  Knee extension  -5  Ankle dorsiflexion    Ankle plantarflexion    Ankle inversion    Ankle eversion     (Blank rows = not tested)  LOWER EXTREMITY MMT:  Generally 4- to 4/5 left LE  LOWER EXTREMITY SPECIAL TESTS:  Knee special tests: deferred due to MRI findings  FUNCTIONAL TESTS:  5 times sit to stand: complete next visit Timed up and go (TUG): complete next visit  GAIT: Distance walked: 30 Assistive device utilized: None Level of assistance: Complete Independence Comments: antalgic   TODAY'S TREATMENT:                                                                                                                              DATE: 04/07/22 Pt seen for aquatic therapy today.  Treatment took place in water 3.25-4.5 ft in depth at the [facility]. Temp of water was 91.  Pt entered (backwards)/exited (forward) the pool via stairs with supervision with bilat rail.  Prior to entering pool: manual therapy  IASTM to bilat distal to mid hamstrings with pt in prone (on table) to decrease fascial restrictions and increase ROM; PROM into knee flexion (quad stretch) each LE x 15s x 2  AQUATIC THERAPY Holding wall:  side to side lunge; marching in place; hip abdct/add x 10; bilat heel raisesx 10; hip ext to toe tap x 10 each   STS on bench with feet on blue step with CGA to steady (x 10)  Lateral L step up/down x 10  Holding barbell with CGA: forward/ backward gait ( cues for heel strike/toe off);   side stepping R/L SLS with light intermittent UE support on wall up,  from 20s x2 on each  Pt requires the buoyancy and hydrostatic pressure of water for support, and to offload joints by unweighting joint load by at least 50 % in navel deep water and by at least 75-80% in chest to neck deep water.  Viscosity of the water is needed for resistance of strengthening. Water current perturbations provides challenge to standing balance requiring increased core  activation.  PATIENT EDUCATION:  Education details: aquatics progressions/ modifications  Person educated: Patient Education method: Consulting civil engineer, Media planner, Corporate treasurer cues, Verbal cues Education comprehension: verbalized understanding, returned demonstration, verbal cues required, and tactile cues required  HOME EXERCISE PROGRAM: Access Code: [email or web address] URL: [email or web address] Date:  03/06/2022 Prepared by: Candyce Churn  Exercises - Supine Quadricep Sets  - 1 x daily - 7 x weekly - 3 sets - 10 reps - Small Range Straight Leg Raise  - 1 x daily - 7 x weekly - 3 sets - 10 reps - Supine Heel Slide  - 1 x daily - 7 x weekly - 3 sets - 10 reps - Seated Knee Extension Stretch with Chair  - 1 x daily - 7 x weekly - 1 sets - 5 reps - as long as you can hold  ASSESSMENT:  CLINICAL IMPRESSION: Pt requires therapist in the water with her at all times due to fear in the water. She has been completing reciprocal gait on stairs (enter/exit) into out of water with bilat rails without difficulty, but some pain.  Pt reported reduction of pain in bilat knees after IASTM to bilat hamstrings.  Encouraged pt to use rolling pin to posterior thighs for self massage at home.    She reports reduction of pain in both knees to 1/10 while in water. Pt progressing gradually towards LTGs.    OBJECTIVE IMPAIRMENTS: difficulty walking, decreased ROM, decreased strength, increased fascial restrictions, increased muscle spasms, impaired flexibility, and pain.   ACTIVITY LIMITATIONS: carrying, lifting, bending, sitting, standing, squatting, sleeping, stairs, transfers, and dressing  PARTICIPATION LIMITATIONS: meal prep, cleaning, laundry, driving, shopping, community activity, and yard work  PERSONAL FACTORS: Age, Fitness, and Past/current experiences are also affecting patient's functional outcome.   REHAB POTENTIAL: Good  CLINICAL DECISION MAKING: Stable/uncomplicated  EVALUATION COMPLEXITY:  Low   GOALS: Goals reviewed with patient? Yes  SHORT TERM GOALS: Target date: 04/02/2022   Pain report to be no greater than 4/10  Baseline: Goal status: INITIAL  2.  Patient will be independent with initial HEP  Baseline:  Goal status: INITIAL   LONG TERM GOALS: Target date: 04/30/2022    Patient to report pain no greater than 2/10  Baseline:  Goal status: INITIAL  2.  Patient to be independent with advanced HEP  Baseline:  Goal status: INITIAL  3.  FOTO score of 63 or better Baseline:  Goal status: INITIAL  4.  Patient to report 50-85% improvement in overall symptoms Baseline:  Goal status: INITIAL  5.  Patient to be able to ascend and descend steps with reciprocal gait safely Baseline:  Goal status: INITIAL   PLAN:  PT FREQUENCY: 2x/week  PT DURATION: 8 weeks  PLANNED INTERVENTIONS: Therapeutic exercises, Therapeutic activity, Neuromuscular re-education, Balance training, Gait training, Patient/Family education, Self Care, Joint mobilization, Stair training, Aquatic Therapy, Dry Needling, Electrical stimulation, Cryotherapy, Moist heat, Splintting, Taping, Vasopneumatic device, Ultrasound, Ionotophoresis '4mg'$ /ml Dexamethasone, Manual therapy, and Re-evaluation  PLAN FOR NEXT SESSION: PT to assess goals.   Kerin Perna, PTA 04/07/22 12:29 PM [facility] [address] [place], [place], [address] Phone: [phone]   Fax:  [phone]

## 2022-04-09 ENCOUNTER — Ambulatory Visit (HOSPITAL_BASED_OUTPATIENT_CLINIC_OR_DEPARTMENT_OTHER): Payer: Medicare Other | Admitting: Physical Therapy

## 2022-04-09 ENCOUNTER — Ambulatory Visit: Payer: Medicare Other | Attending: Orthopaedic Surgery

## 2022-04-09 DIAGNOSIS — R262 Difficulty in walking, not elsewhere classified: Secondary | ICD-10-CM | POA: Diagnosis present

## 2022-04-09 DIAGNOSIS — M25661 Stiffness of right knee, not elsewhere classified: Secondary | ICD-10-CM | POA: Diagnosis present

## 2022-04-09 DIAGNOSIS — M25562 Pain in left knee: Secondary | ICD-10-CM | POA: Diagnosis present

## 2022-04-09 DIAGNOSIS — M6281 Muscle weakness (generalized): Secondary | ICD-10-CM | POA: Insufficient documentation

## 2022-04-09 DIAGNOSIS — G8929 Other chronic pain: Secondary | ICD-10-CM | POA: Diagnosis present

## 2022-04-09 DIAGNOSIS — R252 Cramp and spasm: Secondary | ICD-10-CM | POA: Diagnosis present

## 2022-04-09 NOTE — Therapy (Addendum)
OUTPATIENT PHYSICAL THERAPY LOWER EXTREMITY TREATMENT   Patient Name: Krystal Lang MRN: [id number] DOB:12/30/1955, 67 y.o., female Today's Date: 04/10/2022  END OF SESSION:  PT End of Session - 04/09/22 1531     Visit Number 8    Date for PT Re-Evaluation 04/30/22    Authorization Type MEDICARE PART A AND B    Progress Note Due on Visit 10    PT Start Time 1450    PT Stop Time 1538    PT Time Calculation (min) 48 min    Activity Tolerance Patient tolerated treatment well    Behavior During Therapy WFL for tasks assessed/performed             Past Medical History:  Diagnosis Date   Cataract    Mixed form OU   Diabetes mellitus    Fibroid uterus 2001   H/O fatigue 1998   Headache(784.0)    Hirsutism 1998   Idiopathic   History of chicken pox    Hypothyroidism    Menses, irregular 1998   Past Surgical History:  Procedure Laterality Date   DILATION AND CURETTAGE OF UTERUS  2002   In [place]   KNEE SURGERY     TONSILECTOMY, ADENOIDECTOMY, BILATERAL MYRINGOTOMY AND TUBES     TONSILLECTOMY     Patient Active Problem List   Diagnosis Date Noted   Unilateral primary osteoarthritis, left knee 02/27/2022   Bilateral primary osteoarthritis of knee 05/11/2017   Dyspareunia, female 11/13/2011   Type 2 diabetes mellitus ([place]) 11/13/2011   Alopecia 11/13/2011   History of chicken pox    Headache(784.0)    Hypothyroidism    Menses, irregular    Hirsutism    H/O fatigue    Fibroid uterus     PCP: Dixie Dials, MD   REFERRING PROVIDER: Mcarthur Rossetti, MD  REFERRING DIAG: [phone] (ICD-10-CM) - Chronic pain of left knee  THERAPY DIAG:  Muscle weakness (generalized)  Chronic pain of left knee  Difficulty in walking, not elsewhere classified  Cramp and spasm  Stiffness of right knee, not elsewhere classified  Rationale for Evaluation and Treatment: Rehabilitation  ONSET DATE: 02/28/2022  SUBJECTIVE:   SUBJECTIVE STATEMENT: Left knee  pain still present.  "No change"  but I am hoping to continue therapy for a little while longer to keep the knee as mobile as possible to prepare for surgery.     PERTINENT HISTORY: Prior surgery right knee PAIN:  Are you having pain? Yes: NPRS scale: 5/10 Pain location: knees Pain description: aching Aggravating factors: weight bearing, twisting Relieving factors: medication, rest  PRECAUTIONS: None  WEIGHT BEARING RESTRICTIONS: No  FALLS:  Has patient fallen in last 6 months? Yes. Number of falls 1  LIVING ENVIRONMENT: Lives with: lives with their family Lives in: House/apartment Stairs: Yes: Internal: 12 steps; can reach both and External: 5 steps; on left going up Has following equipment at home: None  OCCUPATION: retired  PLOF: Independent, Independent with basic ADLs, Independent with household mobility without device, Independent with community mobility without device, Independent with homemaking with ambulation, Independent with gait, and Independent with transfers  PATIENT GOALS: She would like to get the knee as strong as possible before she has surgery.    NEXT MD VISIT: not scheduled currently  OBJECTIVE:   DIAGNOSTIC FINDINGS: IMPRESSION: 1. Radial tear involving the posterior horn of the medial meniscus at the meniscal root with detachment and medial protrusion of the meniscus. 2. Horizontal cleavage-type tear in the midbody region  of the medial meniscus. 3. Severe mucoid degeneration of the ACL. 4. Advanced tricompartmental degenerative changes as described above. 5. Large joint effusion and moderate-sized leaking Baker's cyst.  PATIENT SURVEYS:  FOTO 54 (goal is 14)  COGNITION: Overall cognitive status: Within functional limits for tasks assessed     SENSATION: WFL  POSTURE: No Significant postural limitations  LOWER EXTREMITY ROM:  Active ROM Right eval Left eval  Hip flexion    Hip extension    Hip abduction    Hip adduction    Hip  internal rotation    Hip external rotation    Knee flexion  95  Knee extension  -5  Ankle dorsiflexion    Ankle plantarflexion    Ankle inversion    Ankle eversion     (Blank rows = not tested)  LOWER EXTREMITY MMT:  Generally 4- to 4/5 left LE  LOWER EXTREMITY SPECIAL TESTS:  Knee special tests: deferred due to MRI findings  FUNCTIONAL TESTS:  5 times sit to stand: complete next visit Timed up and go (TUG): complete next visit  GAIT: Distance walked: 30 Assistive device utilized: None Level of assistance: Complete Independence Comments: antalgic   TODAY'S TREATMENT:                                                                                                                              DATE: 04/09/22 Recumbent bike x 5 min Patient requested instruction in what she could do at her local fitness facility: Leg press 70 lb bilateral x 20 Leg press 40 lb singles x 20 left Educated patient on proper use of leg extension and leg curl machines at gym including correct angle to reduce shearing force at PF joint Wall sits 10 times 20 sec hold: heavy verbal cues for appropriate angle and hold times Patient had multiple questions throughout session which impeded ability to do any additional tasks today    DATE: 04/07/22 Pt seen for aquatic therapy today.  Treatment took place in water 3.25-4.5 ft in depth at the [facility]. Temp of water was 91.  Pt entered (backwards)/exited (forward) the pool via stairs with supervision with bilat rail.  Prior to entering pool: manual therapy  IASTM to bilat distal to mid hamstrings with pt in prone (on table) to decrease fascial restrictions and increase ROM; PROM into knee flexion (quad stretch) each LE x 15s x 2  AQUATIC THERAPY Holding wall:  side to side lunge; marching in place; hip abdct/add x 10; bilat heel raisesx 10; hip ext to toe tap x 10 each   STS on bench with feet on blue step with CGA to steady (x 10)  Lateral L  step up/down x 10  Holding barbell with CGA: forward/ backward gait ( cues for heel strike/toe off);   side stepping R/L SLS with light intermittent UE support on wall up,  from 20s x2 on each  Pt requires the buoyancy and hydrostatic  pressure of water for support, and to offload joints by unweighting joint load by at least 50 % in navel deep water and by at least 75-80% in chest to neck deep water.  Viscosity of the water is needed for resistance of strengthening. Water current perturbations provides challenge to standing balance requiring increased core activation.  PATIENT EDUCATION:  Education details: aquatics progressions/ modifications  Person educated: Patient Education method: Consulting civil engineer, Media planner, Corporate treasurer cues, Verbal cues Education comprehension: verbalized understanding, returned demonstration, verbal cues required, and tactile cues required  HOME EXERCISE PROGRAM: Access Code: [email or web address] URL: [email or web address] Date: 03/06/2022 Prepared by: Candyce Churn  Exercises - Supine Quadricep Sets  - 1 x daily - 7 x weekly - 3 sets - 10 reps - Small Range Straight Leg Raise  - 1 x daily - 7 x weekly - 3 sets - 10 reps - Supine Heel Slide  - 1 x daily - 7 x weekly - 3 sets - 10 reps - Seated Knee Extension Stretch with Chair  - 1 x daily - 7 x weekly - 1 sets - 5 reps - as long as you can hold  ASSESSMENT:  CLINICAL IMPRESSION: Patient has continued to have pain in the left knee but admits she is much better than when she started PT.  She is concerned that she may need surgery but is unable to do this at this time.  She is hoping to defer surgery for a while.  She has antalgic gait but is able to ambulate with less limping.  She still lacks full extension on the left and left quad is still atrophied.  She would benefit from continued skilled PT to continue left quad rehab, ROM and stability training.    OBJECTIVE IMPAIRMENTS: difficulty walking, decreased ROM,  decreased strength, increased fascial restrictions, increased muscle spasms, impaired flexibility, and pain.   ACTIVITY LIMITATIONS: carrying, lifting, bending, sitting, standing, squatting, sleeping, stairs, transfers, and dressing  PARTICIPATION LIMITATIONS: meal prep, cleaning, laundry, driving, shopping, community activity, and yard work  PERSONAL FACTORS: Age, Fitness, and Past/current experiences are also affecting patient's functional outcome.   REHAB POTENTIAL: Good  CLINICAL DECISION MAKING: Stable/uncomplicated  EVALUATION COMPLEXITY: Low   GOALS: Goals reviewed with patient? Yes  SHORT TERM GOALS: Target date: 04/02/2022   Pain report to be no greater than 4/10  Baseline: Goal status: IN PROGRESS  2.  Patient will be independent with initial HEP  Baseline:  Goal status: MET   LONG TERM GOALS: Target date: 04/30/2022    Patient to report pain no greater than 2/10  Baseline:  Goal status: INITIAL  2.  Patient to be independent with advanced HEP  Baseline:  Goal status: INITIAL  3.  FOTO score of 63 or better Baseline:  Goal status: INITIAL  4.  Patient to report 50-85% improvement in overall symptoms Baseline:  Goal status: INITIAL  5.  Patient to be able to ascend and descend steps with reciprocal gait safely Baseline:  Goal status: INITIAL   PLAN:  PT FREQUENCY: 2x/week  PT DURATION: 8 weeks  PLANNED INTERVENTIONS: Therapeutic exercises, Therapeutic activity, Neuromuscular re-education, Balance training, Gait training, Patient/Family education, Self Care, Joint mobilization, Stair training, Aquatic Therapy, Dry Needling, Electrical stimulation, Cryotherapy, Moist heat, Splintting, Taping, Vasopneumatic device, Ultrasound, Ionotophoresis '4mg'$ /ml Dexamethasone, Manual therapy, and Re-evaluation  PLAN FOR NEXT SESSION: PT to assess goals.   Anderson Malta B. Karmina Zufall, PT 04/10/22 12:03 AM  [facility] [address] [place], [place], [address] Phone:  [phone]   Fax:  [phone]

## 2022-04-10 ENCOUNTER — Ambulatory Visit (INDEPENDENT_AMBULATORY_CARE_PROVIDER_SITE_OTHER): Payer: Medicare Other | Admitting: Orthopaedic Surgery

## 2022-04-10 ENCOUNTER — Encounter: Payer: Self-pay | Admitting: Orthopaedic Surgery

## 2022-04-10 DIAGNOSIS — M25562 Pain in left knee: Secondary | ICD-10-CM

## 2022-04-10 DIAGNOSIS — G8929 Other chronic pain: Secondary | ICD-10-CM

## 2022-04-10 DIAGNOSIS — M1712 Unilateral primary osteoarthritis, left knee: Secondary | ICD-10-CM

## 2022-04-10 NOTE — Progress Notes (Signed)
Krystal Lang comes in today for continued follow-up as a relates to significant left knee pain with known osteoarthritis of her left knee.  She had a right total knee arthroplasty in March of last year performed in [facility].  She went there for robotic knee surgery.  The knee still hurts for her right side but her range of motion is excellent.  She would like for the right knee to feel better but I do believe some of her right knee pain is just recovering from surgery still but also the fact that she puts more weight on the right knee compensating for the severe pain she has with her left knee.  She has been through physical therapy for this left knee.  She has had steroid injection and hyaluronic acid injection for her left knee.  She is now inquiring about trying PRP for the left knee.  She would also like labs drawn today to check a rheumatoid panel as well as a CBC and a calcium level.  Her left knee continues to show evidence of severe arthritis with slight varus malalignment as well as swelling and pain throughout the arc of motion of the knee especially in the posterior aspect of her knee.  Plain films and MRI of the left knee in the past 6 months show the severity of her arthritis.  We had a long and thorough discussion again about her left knee.  I will send her to my partner Dr. Rolena Infante to consider PRP for her left knee per the patient's request.

## 2022-04-11 LAB — CBC WITH DIFFERENTIAL/PLATELET
Absolute Monocytes: 624 cells/uL (ref 200–950)
Basophils Absolute: 101 cells/uL (ref 0–200)
Basophils Relative: 1.3 %
Eosinophils Absolute: 242 cells/uL (ref 15–500)
Eosinophils Relative: 3.1 %
HCT: 41.3 % (ref 35.0–45.0)
Hemoglobin: 13.6 g/dL (ref 11.7–15.5)
Lymphs Abs: 1771 cells/uL (ref 850–3900)
MCH: 28 pg (ref 27.0–33.0)
MCHC: 32.9 g/dL (ref 32.0–36.0)
MCV: 85.2 fL (ref 80.0–100.0)
MPV: 9.9 fL (ref 7.5–12.5)
Monocytes Relative: 8 %
Neutro Abs: 5062 cells/uL (ref 1500–7800)
Neutrophils Relative %: 64.9 %
Platelets: 356 10*3/uL (ref 140–400)
RBC: 4.85 10*6/uL (ref 3.80–5.10)
RDW: 13.9 % (ref 11.0–15.0)
Total Lymphocyte: 22.7 %
WBC: 7.8 10*3/uL (ref 3.8–10.8)

## 2022-04-11 LAB — COMPREHENSIVE METABOLIC PANEL
AG Ratio: 1.7 (calc) (ref 1.0–2.5)
ALT: 15 U/L (ref 6–29)
AST: 16 U/L (ref 10–35)
Albumin: 5 g/dL (ref 3.6–5.1)
Alkaline phosphatase (APISO): 80 U/L (ref 37–153)
BUN/Creatinine Ratio: 23 (calc) — ABNORMAL HIGH (ref 6–22)
BUN: 26 mg/dL — ABNORMAL HIGH (ref 7–25)
CO2: 22 mmol/L (ref 20–32)
Calcium: 10.5 mg/dL — ABNORMAL HIGH (ref 8.6–10.4)
Chloride: 102 mmol/L (ref 98–110)
Creat: 1.12 mg/dL — ABNORMAL HIGH (ref 0.50–1.05)
Globulin: 3 g/dL (calc) (ref 1.9–3.7)
Glucose, Bld: 135 mg/dL — ABNORMAL HIGH (ref 65–99)
Potassium: 4.8 mmol/L (ref 3.5–5.3)
Sodium: 139 mmol/L (ref 135–146)
Total Bilirubin: 0.6 mg/dL (ref 0.2–1.2)
Total Protein: 8 g/dL (ref 6.1–8.1)

## 2022-04-11 LAB — ANA: Anti Nuclear Antibody (ANA): NEGATIVE

## 2022-04-11 LAB — URIC ACID: Uric Acid, Serum: 5.9 mg/dL (ref 2.5–7.0)

## 2022-04-11 LAB — RHEUMATOID FACTOR: Rheumatoid fact SerPl-aCnc: <14 IU/mL (ref <14)

## 2022-04-11 LAB — SEDIMENTATION RATE: Sed Rate: 25 mm/h (ref 0–30)

## 2022-04-14 ENCOUNTER — Telehealth: Payer: Self-pay | Admitting: Orthopaedic Surgery

## 2022-04-14 NOTE — Telephone Encounter (Signed)
Pt called requesting a call back for blood work results. Pt phone number is [phone].

## 2022-04-15 ENCOUNTER — Telehealth (HOSPITAL_BASED_OUTPATIENT_CLINIC_OR_DEPARTMENT_OTHER): Payer: Self-pay | Admitting: Physical Therapy

## 2022-04-15 ENCOUNTER — Ambulatory Visit: Payer: Medicare Other

## 2022-04-15 ENCOUNTER — Ambulatory Visit (HOSPITAL_BASED_OUTPATIENT_CLINIC_OR_DEPARTMENT_OTHER): Payer: Medicare Other | Admitting: Physical Therapy

## 2022-04-15 NOTE — Telephone Encounter (Signed)
Patient did not show for aquatic PT appointment.  Called and left HIPAA compliant message regarding missed appointment and requested patient return phone call to confirm next upcoming appointment.   [place], PTA 04/15/22 3:03 PM [facility] [address] [place], [place], [address] Phone: [phone]   Fax:  [phone]

## 2022-04-17 ENCOUNTER — Ambulatory Visit (HOSPITAL_BASED_OUTPATIENT_CLINIC_OR_DEPARTMENT_OTHER): Payer: Medicare Other | Admitting: Physical Therapy

## 2022-04-18 ENCOUNTER — Ambulatory Visit
Admission: RE | Admit: 2022-04-18 | Discharge: 2022-04-18 | Disposition: A | Discharge status: Home / Self Care | Payer: Medicare Other | Source: Ambulatory Visit | Attending: Neurosurgery | Admitting: Neurosurgery

## 2022-04-18 ENCOUNTER — Encounter: Payer: Self-pay | Admitting: Sports Medicine

## 2022-04-18 ENCOUNTER — Ambulatory Visit (INDEPENDENT_AMBULATORY_CARE_PROVIDER_SITE_OTHER): Payer: Medicare Other | Admitting: Sports Medicine

## 2022-04-18 DIAGNOSIS — M25562 Pain in left knee: Secondary | ICD-10-CM

## 2022-04-18 DIAGNOSIS — G8929 Other chronic pain: Secondary | ICD-10-CM

## 2022-04-18 DIAGNOSIS — M23222 Derangement of posterior horn of medial meniscus due to old tear or injury, left knee: Secondary | ICD-10-CM | POA: Diagnosis not present

## 2022-04-18 DIAGNOSIS — M1712 Unilateral primary osteoarthritis, left knee: Secondary | ICD-10-CM | POA: Diagnosis not present

## 2022-04-18 DIAGNOSIS — M5416 Radiculopathy, lumbar region: Secondary | ICD-10-CM

## 2022-04-18 NOTE — Patient Instructions (Signed)
Krystal Lang, it was great to meet you today, thank you for letting me participate in your care!  Today, we discussed options for your knee pain.  1.) PRP injection --> patient is asked to bring down your blood with normal inflammatory markers to help repair healing and ultimately reducing pain and swelling 2.) Extended-release corticosteroid injection (Zilretta) --> longer-lasting relief than regular cortisone injection.  I would recommend doing this injection again on research on this to see if it may be helpful for you. 3.) Proceeding with Knee replacement with Dr. Ninfa Linden  Dr. Rolena Infante' Instructions and What to Expect for PRP Injections:  Platelet-rich plasma is used in musculoskeletal medicine to focus your own body's ability to heal. It has several well-done published randomized control trials (RCT) which demonstrate both its effectiveness and safety in many musculoskeletal conditions, including osteoarthritis, tendinopathies, and damaged vertebral discs. PRP has been in clinical use since the 1990's. Many people know that platelets form a clot if there is a cut in the skin. It turns out that platelets do not only form a clot, they also start the body's own repair process. When platelets activate to form a clot, they also release alpha granules which have hundreds of chemical messengers in them that initiate and organize repair to the damaged tissue. Precisely placing PRP into the site of injury will initiate the healing process by activating on the damaged cartilage or tendon. This is an inflammatory process, and inflammation is the vital first phase of Healing.  What to expect and how to prepare for PRP   2 weeks prior to the procedure: depending on the procedure, you may need to arrange for a driver to bring you home. IF you are having a lower extremity procedure, we can provide crutches as needed.   10 days prior to the procedure: Stop taking anti-inflammatory drugs like  Ibuprofen/Motrin, Advil, Naprosyn, Celebrex, or Meloxicam. Even aspirin should be stopped (but need to discuss this with Dr. Rolena Infante and your cardiologist beforehand). Let Dr. Rolena Infante know if you have been taking prednisone or other corticosteroids in the last month.   The day before the procedure: thoroughly shower and clean your skin.    The day of the procedure: Wear loose-fitting clothing like sweatpants or shorts. If you are having an upper body procedure wear a top that can button or zip up.  PRP will initiate healing and a productive inflammation, and PRP therapy will make the body part treated sore for 4 days to two weeks. Anti-inflammatory drugs (i.e. ibuprofen, Naprosyn, Celebrex) and corticosteroids such as prednisone can blunt or stop this process, so it is important to not take any anti-inflammatory drugs for 7 days before getting PRP therapy, or for at least three weeks after PRP therapy. Corticosteroid injections can blunt inflammation for 30 days, so let us know if you have had one recently. Depending on the body part injected, you may be in a sling or on crutches for several days. Just like wringing out a wet dishcloth, if you load or tense a tendon or ligament that has just been injected with PRP, some of the PRP injected will squish out. By keeping the body part treated relaxed by using a sling (for the shoulder or arm) or crutches (for hips and legs) for a few days, the PRP can bind in place and do its job.   You may need a driver to bring you home.  Tobacco/nicotine is a potent toxin and its use constricts small blood vessels which are  needed for tissue repair.  Tobacco/nicotine use will limit the effectiveness of any treatment and stopping tobacco use is one of the single  greatest actions you can take to improve your health. Avoid toxins like alcohol, which inhibits and depresses the cells needed for tissue repair.  What happens during the PRP procedure?  Platelet  rich plasma is made by taking some of your blood and performing a two-stage centrifuge process on it to concentrate the PRP. First, your blood is drawn into a syringe with a small amount of anti-coagulant in it (this is to keep the blood from clotting during this process). The amount of blood drawn is usually about 10-30 milliliters, depending on how much PRP is needed for the treatment.  (There are 355 milliliters in a 12-ounce soda can for comparison).  Then the blood is transferred in a sterile fashion into a centrifuge tube. It is then centrifuged for the first cycle where the red blood cells are isolated and discarded. In the second centrifuge cycle, the platelet-rich fraction of the remaining plasma is concentrated and placed in a syringe. The skin at the injection site is numbed with a small amount of topical cooling spray. Dr Dianah Field will then precisely inject the PRP into the injury site using ultrasound guidance.  What to do after your procedure  I will give you specific medicine to control any discomfort you may have after the procedure. Avoid NSAIDs like ibuprofen. Acetaminophen can be used for mild pain.  Depending on the part of the body you will need to have relative rest for the first 1-3 days, sometimes a cane or crutches may be used to help you ambulate. After 3 days, unless otherwise instructed, the treated body part should be used and slowly moved through its full range of motion. It will be sore, but you will not be doing damage by moving it, in fact it needs to move to heal. If you were on crutches for a period of time, walking is ok once you are off the crutches. For now, avoid activities that specifically hurt you before being treated. Exercise is vital to good health and finding a way to cross train around your injury is important not only for your physical health, but for your mental health as well. Ask me about cross training options for your injury. Some brief  (10 minutes or less) period of heat or ice therapy will not hurt the therapy, but it is not required. Usually, depending on the initial injury, physical therapy or home exercises are started from two weeks to four weeks after injection. Improvements in pain and function should be expected from 8 weeks to 12 weeks after injection and some injuries may require more than one treatment. We may repeat treatment for PRP if helpful at the 3-6 month mark.  If you have any further questions, please give the clinic a call [phone].  Krystal Barman, DO Primary Care Sports Medicine Physician  [place]

## 2022-04-18 NOTE — Progress Notes (Signed)
Krystal Lang - 67 y.o. female MRN [id number]  Date of birth: [birth year]  Office Visit Note: Visit Date: 04/18/2022 PCP: Dixie Dials, MD Referred by: Dixie Dials, MD  Subjective: Chief Complaint  Patient presents with   Left Knee - Pain   HPI: Krystal Lang is a pleasant 67 y.o. female who presents today for discussion on left knee pain with advanced arthritis. Her husband, Vicenta Aly is present today as well, he is a cardiologist here at [employer].  Her left knee started bothering her following her right total knee arthroplasty in March of this past year that was performed in [facility].  This was performed with robotic knee surgery.  She has been working through physical therapy as well as home therapy and feels like the left side is compensating more.  She did previously received a corticosteroid injection which gave her good relief but only for about 2 months.  She has had accumulation of her fluid.  She did undergo a series of hyaluronic acid injections which actually caused her knee to become more painful.  She is inquiring about other additional treatment such as PRP today.  She does have instability within the knee and her pain is limiting her from activities of daily living.  Reviewed MRI today with [name].  Pertinent ROS were reviewed with the patient and found to be negative unless otherwise specified above in HPI.   Assessment & Plan: Visit Diagnoses:  1. Chronic pain of left knee   2. Unilateral primary osteoarthritis, left knee   3. Derangement of posterior horn of medial meniscus due to old tear or injury, left knee    Plan: I had a good discussion with [name] today regarding treatment options for her knee.  She was inquiring about PRP, I did discuss the nature of how this injection works and how PRP versus corticosteroid or hyaluronic acid injections does have recent evidence with prolonged benefit at the 3, 6 and 39-monthmark.  However, I did discuss with  her that she does have severe tricompartmental degenerative change, and the PRP still may provide her benefit but it is less likely to give her significant relief given the degree of her arthritic change.  We did discuss considering this versus an extended release corticosteroid, such as Zilretta.  This may be an option given the fact that she had 2 months of relief from a regular corticosteroid injection.  She is also considering simply moving forward with a total knee arthroplasty.  She does note she would have to wait about 3 months after corticosteroid injection.  I would like her to continue her home rehab exercises to help stabilize the knee in the interim.  She will think about this decision and let me know whether she would like to proceed with PRP, extended release corticosteroid, or simple knee replacement done by Dr. BNinfa Linden  I do think the patient would benefit from a long-acting corticosteroid injection, I.e. Zilretta, as they have tried topical and oral anti-inflammatories such as ibuprofen, celebrex without much relief. The have undergone nonpharmacologic therapy with lifestyle modification, home exercise and physical therapy, and attempted weight loss and exercise regimens without significant relief. They have failed long-term benefit from previous corticosteroid injection therapy with only temporary relief of 2 months or less.  For these reasons, I do think she is an appropriate candidate for Zilretta. If she desires, We will send authorization through the insurance company, will notify patient when this is approved and we will  schedule in clinic for injection therapy at this time when works for the patient--she will call me first to decide before ordering.  Follow-up: Will call or message for decision on next steps   Meds & Orders: No orders of the defined types were placed in this encounter.  No orders of the defined types were placed in this encounter.    Procedures: No procedures  performed      Clinical History: No specialty comments available.  She reports that she has never smoked. She has never used smokeless tobacco.  Recent Labs    04/10/22 1640  LABURIC 5.9    Objective:    Physical Exam  Gen: Well-appearing, in no acute distress; non-toxic CV:  Well-perfused. Warm.  Resp: Breathing unlabored on room air; no wheezing. Psych: Fluid speech in conversation; appropriate affect; normal thought process Neuro: Sensation intact throughout. No gross coordination deficits.   Ortho Exam - Left knee: Evaluation of the left knee does show a moderate to large effusion without significant warmth.  The knee does have a slight varus malalignment.  There is patellar crepitus.  Pain with endrange flexion. NVI.  Imaging:  *Independent review of the MRI of the left knee from 02/22/2022 was independently interpreted and reviewed by myself.  There is advanced tricompartmental degenerative changes with high-grade cartilage loss, most severe in the medial joint space..  There is a large joint effusion with a likely Baker's cyst.  There is evidence of a subacute to chronic radial tear of the posterior horn of the medial meniscus.   MR Knee Left w/o contrast CLINICAL DATA:  Left hip pain for 6 months.  EXAM: MRI OF THE LEFT KNEE WITHOUT CONTRAST  TECHNIQUE: Multiplanar, multisequence MR imaging of the knee was performed. No intravenous contrast was administered.  COMPARISON:  Radiographs 10/06/2021  FINDINGS: MENISCI  Medial meniscus: Radial tear involving the posterior horn at the meniscal root with detachment and medial protrusion of the meniscus. There is also a horizontal cleavage-type tear in the midbody region.  Lateral meniscus:  Intact  LIGAMENTS  Cruciates: Severe mucoid degeneration of the ACL. The PCL is intact.  Collaterals:  Intact  CARTILAGE  Patellofemoral: Severe degenerative chondrosis with areas of full-thickness cartilage loss, joint  space narrowing, spurring and subchondral cystic change.  Medial: Severe degenerative chondrosis with full-thickness cartilage loss, joint space narrowing, spurring and subchondral edema.  Lateral: Moderate degenerative chondrosis with early spurring changes.  Joint:  Large joint effusion.  Popliteal Fossa:  Moderate-sized leaking Baker's cyst.  Extensor Mechanism: The patella retinacular structures are intact and the quadriceps and patellar tendons are intact. The patellar tendon is short and there is associated patella baja. Proximal patellar tendinopathy.  Bones:  No acute bony findings.  Other: Unremarkable knee musculature.  IMPRESSION: 1. Radial tear involving the posterior horn of the medial meniscus at the meniscal root with detachment and medial protrusion of the meniscus. 2. Horizontal cleavage-type tear in the midbody region of the medial meniscus. 3. Severe mucoid degeneration of the ACL. 4. Advanced tricompartmental degenerative changes as described above. 5. Large joint effusion and moderate-sized leaking Baker's cyst.  Electronically Signed   By: Marijo Sanes M.D.   On: 02/24/2022 12:29   Past Medical/Family/Surgical/Social History: Medications & Allergies reviewed per EMR, new medications updated. Patient Active Problem List   Diagnosis Date Noted   Unilateral primary osteoarthritis, left knee 02/27/2022   Bilateral primary osteoarthritis of knee 05/11/2017   Dyspareunia, female 11/13/2011   Type 2 diabetes mellitus ([place])  11/13/2011   Alopecia 11/13/2011   History of chicken pox    Headache(784.0)    Hypothyroidism    Menses, irregular    Hirsutism    H/O fatigue    Fibroid uterus    Past Medical History:  Diagnosis Date   Cataract    Mixed form OU   Diabetes mellitus    Fibroid uterus 2001   H/O fatigue 1998   Headache(784.0)    Hirsutism 1998   Idiopathic   History of chicken pox    Hypothyroidism    Menses, irregular 1998    Family History  Problem Relation Age of Onset   Diabetes Mother    Diabetes Father    Heart disease Father        MI   Diabetes Sister    Macular degeneration Sister    Diabetes Brother    Heart disease Brother        Angioplasty   Past Surgical History:  Procedure Laterality Date   DILATION AND CURETTAGE OF UTERUS  2002   In [place]   KNEE SURGERY     TONSILECTOMY, ADENOIDECTOMY, BILATERAL MYRINGOTOMY AND TUBES     TONSILLECTOMY     Social History   Occupational History   Not on file  Tobacco Use   Smoking status: Never   Smokeless tobacco: Never  Vaping Use   Vaping Use: Never used  Substance and Sexual Activity   Alcohol use: No   Drug use: No   Sexual activity: Yes    Birth control/protection: Post-menopausal

## 2022-04-18 NOTE — Progress Notes (Signed)
Pain in left knee since July She has questions today regarding PRP  She has tried cortisone, gel injections, PT, and OTC medications with no relief  She will take an occasional aleve for the pain

## 2022-04-22 ENCOUNTER — Ambulatory Visit (HOSPITAL_BASED_OUTPATIENT_CLINIC_OR_DEPARTMENT_OTHER): Payer: Medicare Other | Admitting: Physical Therapy

## 2022-04-22 ENCOUNTER — Encounter (HOSPITAL_BASED_OUTPATIENT_CLINIC_OR_DEPARTMENT_OTHER): Payer: Self-pay | Admitting: Physical Therapy

## 2022-04-22 DIAGNOSIS — R252 Cramp and spasm: Secondary | ICD-10-CM

## 2022-04-22 DIAGNOSIS — R262 Difficulty in walking, not elsewhere classified: Secondary | ICD-10-CM

## 2022-04-22 DIAGNOSIS — M6281 Muscle weakness (generalized): Secondary | ICD-10-CM | POA: Diagnosis not present

## 2022-04-22 DIAGNOSIS — G8929 Other chronic pain: Secondary | ICD-10-CM

## 2022-04-22 NOTE — Therapy (Signed)
OUTPATIENT PHYSICAL THERAPY LOWER EXTREMITY TREATMENT   Patient Name: Krystal Lang MRN: [id number] DOB:1955/11/07, 67 y.o., female Today's Date: 04/22/2022  END OF SESSION:  PT End of Session - 04/22/22 1812     Visit Number 9    Date for PT Re-Evaluation 04/30/22    Authorization Type MEDICARE PART A AND B    Progress Note Due on Visit 10    PT Start Time 1445    PT Stop Time 1525    PT Time Calculation (min) 40 min    Activity Tolerance Patient tolerated treatment well    Behavior During Therapy WFL for tasks assessed/performed              Past Medical History:  Diagnosis Date   Cataract    Mixed form OU   Diabetes mellitus    Fibroid uterus 2001   H/O fatigue 1998   Headache(784.0)    Hirsutism 1998   Idiopathic   History of chicken pox    Hypothyroidism    Menses, irregular 1998   Past Surgical History:  Procedure Laterality Date   DILATION AND CURETTAGE OF UTERUS  2002   In [place]   KNEE SURGERY     TONSILECTOMY, ADENOIDECTOMY, BILATERAL MYRINGOTOMY AND TUBES     TONSILLECTOMY     Patient Active Problem List   Diagnosis Date Noted   Unilateral primary osteoarthritis, left knee 02/27/2022   Bilateral primary osteoarthritis of knee 05/11/2017   Dyspareunia, female 11/13/2011   Type 2 diabetes mellitus ([place]) 11/13/2011   Alopecia 11/13/2011   History of chicken pox    Headache(784.0)    Hypothyroidism    Menses, irregular    Hirsutism    H/O fatigue    Fibroid uterus     PCP: Dixie Dials, MD   REFERRING PROVIDER: Mcarthur Rossetti, MD  REFERRING DIAG: [phone] (ICD-10-CM) - Chronic pain of left knee  THERAPY DIAG:  Muscle weakness (generalized)  Chronic pain of left knee  Difficulty in walking, not elsewhere classified  Cramp and spasm  Rationale for Evaluation and Treatment: Rehabilitation  ONSET DATE: 02/28/2022  SUBJECTIVE:   SUBJECTIVE STATEMENT: Left knee pain still present.  "No change".  "It hurts to  bend it"    PERTINENT HISTORY: Prior surgery right knee PAIN:  Are you having pain? Yes: NPRS scale: 7/10 Pain location: Lt knee  Pain description: aching Aggravating factors: weight bearing, twisting Relieving factors: medication, rest  PRECAUTIONS: None  WEIGHT BEARING RESTRICTIONS: No  FALLS:  Has patient fallen in last 6 months? Yes. Number of falls 1  LIVING ENVIRONMENT: Lives with: lives with their family Lives in: House/apartment Stairs: Yes: Internal: 12 steps; can reach both and External: 5 steps; on left going up Has following equipment at home: None  OCCUPATION: retired  PLOF: Independent, Independent with basic ADLs, Independent with household mobility without device, Independent with community mobility without device, Independent with homemaking with ambulation, Independent with gait, and Independent with transfers  PATIENT GOALS: She would like to get the knee as strong as possible before she has surgery.    NEXT MD VISIT: not scheduled currently  OBJECTIVE:   DIAGNOSTIC FINDINGS: IMPRESSION: 1. Radial tear involving the posterior horn of the medial meniscus at the meniscal root with detachment and medial protrusion of the meniscus. 2. Horizontal cleavage-type tear in the midbody region of the medial meniscus. 3. Severe mucoid degeneration of the ACL. 4. Advanced tricompartmental degenerative changes as described above. 5. Large joint effusion and moderate-sized  leaking Baker's cyst.  PATIENT SURVEYS:  FOTO 54 (goal is 56)  COGNITION: Overall cognitive status: Within functional limits for tasks assessed     SENSATION: WFL  POSTURE: No Significant postural limitations  LOWER EXTREMITY ROM:  Active ROM Right eval Left eval  Hip flexion    Hip extension    Hip abduction    Hip adduction    Hip internal rotation    Hip external rotation    Knee flexion  95  Knee extension  -5  Ankle dorsiflexion    Ankle plantarflexion    Ankle  inversion    Ankle eversion     (Blank rows = not tested)  LOWER EXTREMITY MMT:  Generally 4- to 4/5 left LE  LOWER EXTREMITY SPECIAL TESTS:  Knee special tests: deferred due to MRI findings  FUNCTIONAL TESTS:  5 times sit to stand: complete next visit Timed up and go (TUG): complete next visit  GAIT: Distance walked: 30 Assistive device utilized: None Level of assistance: Complete Independence Comments: antalgic   TODAY'S TREATMENT:                                                                                                                              DATE: 04/22/22 Pt seen for aquatic therapy today.  Treatment took place in water 3.25-4.5 ft in depth at the [facility]. Temp of water was 91.  Pt entered (backwards)/exited (forward) the pool via stairs with supervision with bilat rail.  Holding wall: high knee marching in place; hip abdct/add x 10; bilat heel/ toe raisesx 10; hamstring curls alternating x 10; hip ext to toe tap x 10 each   LLE Forward step ups; RLE retro step downs x 10 withUE on wall STS on bench with feet on blue step with CGA/SBA steady (x 10)  Holding barbell with CGA: forward/ backward gait ( cues for heel strike/toe off);   side stepping R/L; marching backward/forward; tandem gait forward/backward; forward one step, backward one step x 10 leading with each LE for balance and work on changing direction   Pt requires the buoyancy and hydrostatic pressure of water for support, and to offload joints by unweighting joint load by at least 50 % in navel deep water and by at least 75-80% in chest to neck deep water.  Viscosity of the water is needed for resistance of strengthening. Water current perturbations provides challenge to standing balance requiring increased core activation.  DATE: 04/09/22 Recumbent bike x 5 min Patient requested instruction in what she could do at her local fitness facility: Leg press 70 lb bilateral x 20 Leg press 40  lb singles x 20 left Educated patient on proper use of leg extension and leg curl machines at gym including correct angle to reduce shearing force at PF joint Wall sits 10 times 20 sec hold: heavy verbal cues for appropriate angle and hold times Patient had multiple questions throughout session which impeded ability to  do any additional tasks today    DATE: 04/07/22 Pt seen for aquatic therapy today.  Treatment took place in water 3.25-4.5 ft in depth at the [facility]. Temp of water was 91.  Pt entered (backwards)/exited (forward) the pool via stairs with supervision with bilat rail.  Prior to entering pool: manual therapy  IASTM to bilat distal to mid hamstrings with pt in prone (on table) to decrease fascial restrictions and increase ROM; PROM into knee flexion (quad stretch) each LE x 15s x 2  AQUATIC THERAPY Holding wall:  side to side lunge; marching in place; hip abdct/add x 10; bilat heel raisesx 10; hip ext to toe tap x 10 each   STS on bench with feet on blue step with CGA to steady (x 10)  Lateral L step up/down x 10  Holding barbell with CGA: forward/ backward gait ( cues for heel strike/toe off);   side stepping R/L SLS with light intermittent UE support on wall up,  from 20s x2 on each  Pt requires the buoyancy and hydrostatic pressure of water for support, and to offload joints by unweighting joint load by at least 50 % in navel deep water and by at least 75-80% in chest to neck deep water.  Viscosity of the water is needed for resistance of strengthening. Water current perturbations provides challenge to standing balance requiring increased core activation.  PATIENT EDUCATION:  Education details: aquatics progressions/ modifications  Person educated: Patient Education method: Consulting civil engineer, Media planner, Corporate treasurer cues, Verbal cues Education comprehension: verbalized understanding, returned demonstration, verbal cues required, and tactile cues required  HOME  EXERCISE PROGRAM: Access Code: [email or web address] URL: [email or web address] Date: 03/06/2022 Prepared by: Candyce Churn  Exercises - Supine Quadricep Sets  - 1 x daily - 7 x weekly - 3 sets - 10 reps - Small Range Straight Leg Raise  - 1 x daily - 7 x weekly - 3 sets - 10 reps - Supine Heel Slide  - 1 x daily - 7 x weekly - 3 sets - 10 reps - Seated Knee Extension Stretch with Chair  - 1 x daily - 7 x weekly - 1 sets - 5 reps - as long as you can hold  ASSESSMENT:  CLINICAL IMPRESSION: Patient has continued to have pain in the left knee. She reports she would like to take a trip to [place] in Feb and schedule knee surgery for March.  Focused on loading RLE for preparation of LE knee surgery.  She reported reduction of Lt knee pain to 2/10 when in water, 0/10 in Rt knee. She would benefit from continued skilled PT to continue left quad rehab, ROM and stability training.    OBJECTIVE IMPAIRMENTS: difficulty walking, decreased ROM, decreased strength, increased fascial restrictions, increased muscle spasms, impaired flexibility, and pain.   ACTIVITY LIMITATIONS: carrying, lifting, bending, sitting, standing, squatting, sleeping, stairs, transfers, and dressing  PARTICIPATION LIMITATIONS: meal prep, cleaning, laundry, driving, shopping, community activity, and yard work  PERSONAL FACTORS: Age, Fitness, and Past/current experiences are also affecting patient's functional outcome.   REHAB POTENTIAL: Good  CLINICAL DECISION MAKING: Stable/uncomplicated  EVALUATION COMPLEXITY: Low   GOALS: Goals reviewed with patient? Yes  SHORT TERM GOALS: Target date: 04/02/2022   Pain report to be no greater than 4/10  Baseline: Goal status: IN PROGRESS  2.  Patient will be independent with initial HEP  Baseline:  Goal status: MET   LONG TERM GOALS: Target date: 04/30/2022    Patient to report pain no greater than  2/10  Baseline:  Goal status: INITIAL  2.  Patient to be independent  with advanced HEP  Baseline:  Goal status: INITIAL  3.  FOTO score of 63 or better Baseline:  Goal status: INITIAL  4.  Patient to report 50-85% improvement in overall symptoms Baseline:  Goal status: INITIAL  5.  Patient to be able to ascend and descend steps with reciprocal gait safely Baseline:  Goal status: INITIAL   PLAN:  PT FREQUENCY: 2x/week  PT DURATION: 8 weeks  PLANNED INTERVENTIONS: Therapeutic exercises, Therapeutic activity, Neuromuscular re-education, Balance training, Gait training, Patient/Family education, Self Care, Joint mobilization, Stair training, Aquatic Therapy, Dry Needling, Electrical stimulation, Cryotherapy, Moist heat, Splintting, Taping, Vasopneumatic device, Ultrasound, Ionotophoresis '4mg'$ /ml Dexamethasone, Manual therapy, and Re-evaluation  PLAN FOR NEXT SESSION:continue discharge planning.   Kerin Perna, PTA 04/22/22 6:24 PM [facility] [address] [place], [place], [address] Phone: [phone]   Fax:  [phone]

## 2022-04-23 ENCOUNTER — Ambulatory Visit (HOSPITAL_BASED_OUTPATIENT_CLINIC_OR_DEPARTMENT_OTHER): Payer: Medicare Other | Admitting: Physical Therapy

## 2022-04-23 DIAGNOSIS — R262 Difficulty in walking, not elsewhere classified: Secondary | ICD-10-CM

## 2022-04-23 DIAGNOSIS — M6281 Muscle weakness (generalized): Secondary | ICD-10-CM

## 2022-04-23 DIAGNOSIS — G8929 Other chronic pain: Secondary | ICD-10-CM

## 2022-04-23 DIAGNOSIS — R252 Cramp and spasm: Secondary | ICD-10-CM

## 2022-04-23 NOTE — Therapy (Addendum)
OUTPATIENT PHYSICAL THERAPY LOWER EXTREMITY TREATMENT  Progress Note Reporting Period 03/05/22 to 04/23/22  See note below for Objective Data and Assessment of Progress/Goals.     Patient Name: Krystal Lang MRN: [id number] DOB:[birth year], 67 y.o., female Today's Date: 04/23/2022  END OF SESSION:  PT End of Session - 04/23/22 1120     Visit Number 10    Date for PT Re-Evaluation 04/30/22    Authorization Type MEDICARE PART A AND B    Progress Note Due on Visit 10    PT Start Time 1120    PT Stop Time 1158    PT Time Calculation (min) 38 min              Past Medical History:  Diagnosis Date   Cataract    Mixed form OU   Diabetes mellitus    Fibroid uterus 2001   H/O fatigue 1998   Headache(784.0)    Hirsutism 1998   Idiopathic   History of chicken pox    Hypothyroidism    Menses, irregular 1998   Past Surgical History:  Procedure Laterality Date   DILATION AND CURETTAGE OF UTERUS  2002   In [place]   KNEE SURGERY     TONSILECTOMY, ADENOIDECTOMY, BILATERAL MYRINGOTOMY AND TUBES     TONSILLECTOMY     Patient Active Problem List   Diagnosis Date Noted   Unilateral primary osteoarthritis, left knee 02/27/2022   Bilateral primary osteoarthritis of knee 05/11/2017   Dyspareunia, female 11/13/2011   Type 2 diabetes mellitus ([place]) 11/13/2011   Alopecia 11/13/2011   History of chicken pox    Headache(784.0)    Hypothyroidism    Menses, irregular    Hirsutism    H/O fatigue    Fibroid uterus     PCP: Dixie Dials, MD   REFERRING PROVIDER: Mcarthur Rossetti, MD  REFERRING DIAG: [phone] (ICD-10-CM) - Chronic pain of left knee  THERAPY DIAG:  Muscle weakness (generalized)  Chronic pain of left knee  Difficulty in walking, not elsewhere classified  Cramp and spasm  Rationale for Evaluation and Treatment: Rehabilitation  ONSET DATE: 02/28/2022  SUBJECTIVE:   SUBJECTIVE STATEMENT: Pt was tired after last session and pain in Lt  knee went up to 10/10.  Took advil and put analgesic for pain relief and pain is better today.    PERTINENT HISTORY: Prior surgery right knee PAIN:  Are you having pain? Yes: NPRS scale: 6/10 Pain location: Lt knee  Pain description: aching Aggravating factors: weight bearing, twisting Relieving factors: medication, rest  PRECAUTIONS: None  WEIGHT BEARING RESTRICTIONS: No  FALLS:  Has patient fallen in last 6 months? Yes. Number of falls 1  LIVING ENVIRONMENT: Lives with: lives with their family Lives in: House/apartment Stairs: Yes: Internal: 12 steps; can reach both and External: 5 steps; on left going up Has following equipment at home: None  OCCUPATION: retired  PLOF: Independent, Independent with basic ADLs, Independent with household mobility without device, Independent with community mobility without device, Independent with homemaking with ambulation, Independent with gait, and Independent with transfers  PATIENT GOALS: She would like to get the knee as strong as possible before she has surgery.    NEXT MD VISIT: not scheduled currently  OBJECTIVE:   DIAGNOSTIC FINDINGS: IMPRESSION: 1. Radial tear involving the posterior horn of the medial meniscus at the meniscal root with detachment and medial protrusion of the meniscus. 2. Horizontal cleavage-type tear in the midbody region of the medial meniscus. 3. Severe mucoid degeneration  of the ACL. 4. Advanced tricompartmental degenerative changes as described above. 5. Large joint effusion and moderate-sized leaking Baker's cyst.  PATIENT SURVEYS:  FOTO 54 (goal is 66)  04/23/22:  FOTO 63  COGNITION: Overall cognitive status: Within functional limits for tasks assessed     SENSATION: WFL  POSTURE: No Significant postural limitations  LOWER EXTREMITY ROM:  Active ROM Right eval Left eval  Hip flexion    Hip extension    Hip abduction    Hip adduction    Hip internal rotation    Hip external rotation     Knee flexion  95  Knee extension  -5  Ankle dorsiflexion    Ankle plantarflexion    Ankle inversion    Ankle eversion     (Blank rows = not tested)  LOWER EXTREMITY MMT:  Generally 4- to 4/5 left LE  LOWER EXTREMITY SPECIAL TESTS:  Knee special tests: deferred due to MRI findings  FUNCTIONAL TESTS:  04/23/22:   5 times sit to stand: 12.8s 04/23/22:  Timed up and go (TUG): 9.94s   GAIT: Distance walked: 30 Assistive device utilized: None Level of assistance: Complete Independence Comments: antalgic   TODAY'S TREATMENT:                                                                                                                              DATE: 04/23/22  * TUG /  5xSTS / FOTO  Pt seen for aquatic therapy today.  Treatment took place in water 3.25-4.5 ft in depth at the [facility]. Temp of water was 91.  Pt entered (backwards)/exited (forward) the pool via stairs with supervision with bilat rail.  Holding wall: side stepping R/L;  bilat heel/ toe raises x 10;  hip abdct/add x 10; Hip ext x 10 each   RLE/ LLE  Forward step ups with eccentric lowering; LLE step downs, RLE retro step ups x 10 with UE on bilat rails STS on bench with feet in staggered stance (RLE back, LLE forward) on blue step with CGA/SBA steady (x 10)  Sitting on bench:  flutter kick, hip abdct/ addct, cycling LEs x 3 rounds    Pt requires the buoyancy and hydrostatic pressure of water for support, and to offload joints by unweighting joint load by at least 50 % in navel deep water and by at least 75-80% in chest to neck deep water.  Viscosity of the water is needed for resistance of strengthening. Water current perturbations provides challenge to standing balance requiring increased core activation.    DATE: 04/22/22 Pt seen for aquatic therapy today.  Treatment took place in water 3.25-4.5 ft in depth at the [facility]. Temp of water was 91.  Pt entered (backwards)/exited  (forward) the pool via stairs with supervision with bilat rail.  Holding wall: high knee marching in place; hip abdct/add x 10; bilat heel/ toe raisesx 10; hamstring curls alternating x 10; hip ext to  toe tap x 10 each   LLE Forward step ups; RLE retro step downs x 10 withUE on wall STS on bench with feet on blue step with CGA/SBA steady (x 10)  Holding barbell with CGA: forward/ backward gait ( cues for heel strike/toe off);   side stepping R/L; marching backward/forward; tandem gait forward/backward; forward one step, backward one step x 10 leading with each LE for balance and work on changing direction   Pt requires the buoyancy and hydrostatic pressure of water for support, and to offload joints by unweighting joint load by at least 50 % in navel deep water and by at least 75-80% in chest to neck deep water.  Viscosity of the water is needed for resistance of strengthening. Water current perturbations provides challenge to standing balance requiring increased core activation.  DATE: 04/09/22 Recumbent bike x 5 min Patient requested instruction in what she could do at her local fitness facility: Leg press 70 lb bilateral x 20 Leg press 40 lb singles x 20 left Educated patient on proper use of leg extension and leg curl machines at gym including correct angle to reduce shearing force at PF joint Wall sits 10 times 20 sec hold: heavy verbal cues for appropriate angle and hold times Patient had multiple questions throughout session which impeded ability to do any additional tasks today    DATE: 04/07/22 Pt seen for aquatic therapy today.  Treatment took place in water 3.25-4.5 ft in depth at the [facility]. Temp of water was 91.  Pt entered (backwards)/exited (forward) the pool via stairs with supervision with bilat rail.  Prior to entering pool: manual therapy  IASTM to bilat distal to mid hamstrings with pt in prone (on table) to decrease fascial restrictions and increase ROM;  PROM into knee flexion (quad stretch) each LE x 15s x 2  AQUATIC THERAPY Holding wall:  side to side lunge; marching in place; hip abdct/add x 10; bilat heel raisesx 10; hip ext to toe tap x 10 each   STS on bench with feet on blue step with CGA to steady (x 10)  Lateral L step up/down x 10  Holding barbell with CGA: forward/ backward gait ( cues for heel strike/toe off);   side stepping R/L SLS with light intermittent UE support on wall up,  from 20s x2 on each  Pt requires the buoyancy and hydrostatic pressure of water for support, and to offload joints by unweighting joint load by at least 50 % in navel deep water and by at least 75-80% in chest to neck deep water.  Viscosity of the water is needed for resistance of strengthening. Water current perturbations provides challenge to standing balance requiring increased core activation.  PATIENT EDUCATION:  Education details: aquatics progressions/ modifications  Person educated: Patient Education method: Consulting civil engineer, Media planner, Corporate treasurer cues, Verbal cues Education comprehension: verbalized understanding, returned demonstration, verbal cues required, and tactile cues required  HOME EXERCISE PROGRAM: Access Code: [email or web address] URL: [email or web address] Date: 03/06/2022 Prepared by: Candyce Churn  Exercises - Supine Quadricep Sets  - 1 x daily - 7 x weekly - 3 sets - 10 reps - Small Range Straight Leg Raise  - 1 x daily - 7 x weekly - 3 sets - 10 reps - Supine Heel Slide  - 1 x daily - 7 x weekly - 3 sets - 10 reps - Seated Knee Extension Stretch with Chair  - 1 x daily - 7 x weekly - 1 sets - 5 reps - as  long as you can hold  ASSESSMENT:  CLINICAL IMPRESSION: Pt's FOTO score has improved.  She has continued pain and ROM limitation in Lt knee, so focus has been on strengthening Rt knee in preparation for surgery on her Lt.She reports she would like to take a trip to [place] in Feb and schedule knee surgery for March.  Pt has  partially met her goals.      OBJECTIVE IMPAIRMENTS: difficulty walking, decreased ROM, decreased strength, increased fascial restrictions, increased muscle spasms, impaired flexibility, and pain.   ACTIVITY LIMITATIONS: carrying, lifting, bending, sitting, standing, squatting, sleeping, stairs, transfers, and dressing  PARTICIPATION LIMITATIONS: meal prep, cleaning, laundry, driving, shopping, community activity, and yard work  PERSONAL FACTORS: Age, Fitness, and Past/current experiences are also affecting patient's functional outcome.   REHAB POTENTIAL: Good  CLINICAL DECISION MAKING: Stable/uncomplicated  EVALUATION COMPLEXITY: Low   GOALS: Goals reviewed with patient? Yes  SHORT TERM GOALS: Target date: 04/02/2022   Pain report to be no greater than 4/10  Baseline: pain in Lt knee up to 10/10 Goal status: Ongoing - 04/23/22  2.  Patient will be independent with initial HEP  Baseline:  Goal status: MET   LONG TERM GOALS: Target date: 04/30/2022    Patient to report pain no greater than 2/10  Baseline:  Goal status: ONGOING - 04/23/22  2.  Patient to be independent with advanced HEP  Baseline:  Goal status: ONGOING   3.  FOTO score of 63 or better Baseline:  Goal status: MET - 04/23/22  4.  Patient to report 50-85% improvement in overall symptoms Baseline:  Goal status:ONGONG   5.  Patient to be able to ascend and descend steps with reciprocal gait safely Baseline:  Goal status:MET 04/04/22   PLAN:  PT FREQUENCY: 2x/week  PT DURATION: 8 weeks  PLANNED INTERVENTIONS: Therapeutic exercises, Therapeutic activity, Neuromuscular re-education, Balance training, Gait training, Patient/Family education, Self Care, Joint mobilization, Stair training, Aquatic Therapy, Dry Needling, Electrical stimulation, Cryotherapy, Moist heat, Splintting, Taping, Vasopneumatic device, Ultrasound, Ionotophoresis '4mg'$ /ml Dexamethasone, Manual therapy, and Re-evaluation  PLAN FOR NEXT  SESSION:d/c vs recert.   Anderson Malta B. Fields, PT 04/23/22 2:08 PM  Kerin Perna, PTA 04/23/22 2:02 PM [facility] [address] [place], [place], [address] Phone: [phone]   Fax:  [phone]

## 2022-04-24 ENCOUNTER — Ambulatory Visit (HOSPITAL_BASED_OUTPATIENT_CLINIC_OR_DEPARTMENT_OTHER): Payer: Medicare Other | Admitting: Physical Therapy

## 2022-04-29 ENCOUNTER — Ambulatory Visit: Payer: Medicare Other

## 2022-04-29 DIAGNOSIS — G8929 Other chronic pain: Secondary | ICD-10-CM

## 2022-04-29 DIAGNOSIS — R252 Cramp and spasm: Secondary | ICD-10-CM

## 2022-04-29 DIAGNOSIS — R262 Difficulty in walking, not elsewhere classified: Secondary | ICD-10-CM

## 2022-04-29 DIAGNOSIS — M6281 Muscle weakness (generalized): Secondary | ICD-10-CM

## 2022-04-29 NOTE — Therapy (Signed)
OUTPATIENT PHYSICAL THERAPY LOWER EXTREMITY RECERT NOTE  Patient Name: Krystal Lang MRN: [id number] DOB:05/13/1955, 67 y.o., female Today's Date: 04/29/2022  END OF SESSION:  PT End of Session - 04/29/22 1654     Visit Number 11    Date for PT Re-Evaluation 06/13/22    Authorization Type MEDICARE PART A AND B    Progress Note Due on Visit 20    PT Start Time 1447    PT Stop Time 1525    PT Time Calculation (min) 38 min    Activity Tolerance Patient tolerated treatment well    Behavior During Therapy WFL for tasks assessed/performed              Past Medical History:  Diagnosis Date   Cataract    Mixed form OU   Diabetes mellitus    Fibroid uterus 2001   H/O fatigue 1998   Headache(784.0)    Hirsutism 1998   Idiopathic   History of chicken pox    Hypothyroidism    Menses, irregular 1998   Past Surgical History:  Procedure Laterality Date   DILATION AND CURETTAGE OF UTERUS  2002   In [place]   KNEE SURGERY     TONSILECTOMY, ADENOIDECTOMY, BILATERAL MYRINGOTOMY AND TUBES     TONSILLECTOMY     Patient Active Problem List   Diagnosis Date Noted   Unilateral primary osteoarthritis, left knee 02/27/2022   Bilateral primary osteoarthritis of knee 05/11/2017   Dyspareunia, female 11/13/2011   Type 2 diabetes mellitus ([place]) 11/13/2011   Alopecia 11/13/2011   History of chicken pox    Headache(784.0)    Hypothyroidism    Menses, irregular    Hirsutism    H/O fatigue    Fibroid uterus     PCP: Dixie Dials, MD   REFERRING PROVIDER: Mcarthur Rossetti, MD  REFERRING DIAG: [phone] (ICD-10-CM) - Chronic pain of left knee  THERAPY DIAG:  Muscle weakness (generalized)  Chronic pain of left knee  Difficulty in walking, not elsewhere classified  Cramp and spasm  Rationale for Evaluation and Treatment: Rehabilitation  ONSET DATE: 02/28/2022  SUBJECTIVE:   SUBJECTIVE STATEMENT: Patient reports she has surgery scheduled for March 19 for  the left knee.  She hopes to continue PT until that time.  She is having more pain in the posterior knee Left > Right.  She inquires about dry needling for the hamstrings bilaterally.      PERTINENT HISTORY: Prior surgery right knee PAIN:  Are you having pain? Yes: NPRS scale: 6/10 Pain location: Lt knee  Pain description: aching Aggravating factors: weight bearing, twisting Relieving factors: medication, rest  PRECAUTIONS: None  WEIGHT BEARING RESTRICTIONS: No  FALLS:  Has patient fallen in last 6 months? Yes. Number of falls 1  LIVING ENVIRONMENT: Lives with: lives with their family Lives in: House/apartment Stairs: Yes: Internal: 12 steps; can reach both and External: 5 steps; on left going up Has following equipment at home: None  OCCUPATION: retired  PLOF: Independent, Independent with basic ADLs, Independent with household mobility without device, Independent with community mobility without device, Independent with homemaking with ambulation, Independent with gait, and Independent with transfers  PATIENT GOALS: She would like to get the knee as strong as possible before she has surgery.    NEXT MD VISIT: 06/17/22  OBJECTIVE:   DIAGNOSTIC FINDINGS: IMPRESSION: 1. Radial tear involving the posterior horn of the medial meniscus at the meniscal root with detachment and medial protrusion of the meniscus. 2. Horizontal  cleavage-type tear in the midbody region of the medial meniscus. 3. Severe mucoid degeneration of the ACL. 4. Advanced tricompartmental degenerative changes as described above. 5. Large joint effusion and moderate-sized leaking Baker's cyst.  PATIENT SURVEYS:  FOTO 54 (goal is 66)  04/23/22:  FOTO 63  COGNITION: Overall cognitive status: Within functional limits for tasks assessed     SENSATION: WFL  POSTURE: No Significant postural limitations  LOWER EXTREMITY ROM:  Active ROM Right eval Left eval Left 04/29/22  Hip flexion     Hip  extension     Hip abduction     Hip adduction     Hip internal rotation     Hip external rotation     Knee flexion  95 100 (estimate)  Knee extension  -5 -5  (estimate)  Ankle dorsiflexion     Ankle plantarflexion     Ankle inversion     Ankle eversion      (Blank rows = not tested)  LOWER EXTREMITY MMT:  Generally 4- to 4/5 left LE 04/29/22: Still generally 4- to 4/5  LOWER EXTREMITY SPECIAL TESTS:  Knee special tests: deferred due to MRI findings  FUNCTIONAL TESTS:  04/23/22:   5 times sit to stand: 12.8s 04/23/22:  Timed up and go (TUG): 9.94s   GAIT: Distance walked: 30 Assistive device utilized: None Level of assistance: Complete Independence Comments: antalgic   TODAY'S TREATMENT:                                                                                                                              DATE: 2/70/62 Recert visit and re-assessment Trigger Point Dry-Needling  Treatment instructions: Expect mild to moderate muscle soreness. S/S of pneumothorax if dry needled over a lung field, and to seek immediate medical attention should they occur. Patient verbalized understanding of these instructions and education.  Patient Consent Given: Yes Education handout provided: Yes Muscles treated: bilateral medial and lateral hamstrings Electrical stimulation performed: No Parameters: N/A Treatment response/outcome: Skilled palpation used to identify tight bands and trigger points in the bilateral hamstrings, once identified, these muscles were treated with dry needling techniques.  On all areas, twitch response and muscle elongation noted.  Patient reported some soreness following treatment.     DATE: 04/23/22  * TUG /  5xSTS / FOTO  Pt seen for aquatic therapy today.  Treatment took place in water 3.25-4.5 ft in depth at the [facility]. Temp of water was 91.  Pt entered (backwards)/exited (forward) the pool via stairs with supervision with bilat  rail.  Holding wall: side stepping R/L;  bilat heel/ toe raises x 10;  hip abdct/add x 10; Hip ext x 10 each   RLE/ LLE  Forward step ups with eccentric lowering; LLE step downs, RLE retro step ups x 10 with UE on bilat rails STS on bench with feet in staggered stance (RLE back, LLE forward) on blue step with CGA/SBA steady (x  10)  Sitting on bench:  flutter kick, hip abdct/ addct, cycling LEs x 3 rounds    Pt requires the buoyancy and hydrostatic pressure of water for support, and to offload joints by unweighting joint load by at least 50 % in navel deep water and by at least 75-80% in chest to neck deep water.  Viscosity of the water is needed for resistance of strengthening. Water current perturbations provides challenge to standing balance requiring increased core activation.    DATE: 04/22/22 Pt seen for aquatic therapy today.  Treatment took place in water 3.25-4.5 ft in depth at the [facility]. Temp of water was 91.  Pt entered (backwards)/exited (forward) the pool via stairs with supervision with bilat rail.  Holding wall: high knee marching in place; hip abdct/add x 10; bilat heel/ toe raisesx 10; hamstring curls alternating x 10; hip ext to toe tap x 10 each   LLE Forward step ups; RLE retro step downs x 10 withUE on wall STS on bench with feet on blue step with CGA/SBA steady (x 10)  Holding barbell with CGA: forward/ backward gait ( cues for heel strike/toe off);   side stepping R/L; marching backward/forward; tandem gait forward/backward; forward one step, backward one step x 10 leading with each LE for balance and work on changing direction   Pt requires the buoyancy and hydrostatic pressure of water for support, and to offload joints by unweighting joint load by at least 50 % in navel deep water and by at least 75-80% in chest to neck deep water.  Viscosity of the water is needed for resistance of strengthening. Water current perturbations provides challenge to  standing balance requiring increased core activation.   PATIENT EDUCATION:  Education details: aquatics progressions/ modifications  Person educated: Patient Education method: Consulting civil engineer, Media planner, Corporate treasurer cues, Verbal cues Education comprehension: verbalized understanding, returned demonstration, verbal cues required, and tactile cues required  HOME EXERCISE PROGRAM: Access Code: [email or web address] URL: [email or web address] Date: 03/06/2022 Prepared by: Candyce Churn  Exercises - Supine Quadricep Sets  - 1 x daily - 7 x weekly - 3 sets - 10 reps - Small Range Straight Leg Raise  - 1 x daily - 7 x weekly - 3 sets - 10 reps - Supine Heel Slide  - 1 x daily - 7 x weekly - 3 sets - 10 reps - Seated Knee Extension Stretch with Chair  - 1 x daily - 7 x weekly - 1 sets - 5 reps - as long as you can hold  ASSESSMENT:  CLINICAL IMPRESSION: Patient has overall improvement but continued mechanical knee pain.  She has surgery scheduled.  She would benefit from continuing skilled PT to prepare her for surgery to encourage successful outcome.       OBJECTIVE IMPAIRMENTS: difficulty walking, decreased ROM, decreased strength, increased fascial restrictions, increased muscle spasms, impaired flexibility, and pain.   ACTIVITY LIMITATIONS: carrying, lifting, bending, sitting, standing, squatting, sleeping, stairs, transfers, and dressing  PARTICIPATION LIMITATIONS: meal prep, cleaning, laundry, driving, shopping, community activity, and yard work  PERSONAL FACTORS: Age, Fitness, and Past/current experiences are also affecting patient's functional outcome.   REHAB POTENTIAL: Good  CLINICAL DECISION MAKING: Stable/uncomplicated  EVALUATION COMPLEXITY: Low   GOALS: Goals reviewed with patient? Yes  SHORT TERM GOALS: Target date: 04/02/2022   Pain report to be no greater than 4/10  Baseline: pain in Lt knee up to 10/10 Goal status: Ongoing - 04/23/22  2.  Patient will be  independent with initial HEP  Baseline:  Goal status: MET   LONG TERM GOALS: Target date: 04/30/2022    Patient to report pain no greater than 2/10  Baseline:  Goal status: ONGOING - 04/23/22  2.  Patient to be independent with advanced HEP  Baseline:  Goal status: ONGOING   3.  FOTO score of 63 or better Baseline:  Goal status: MET - 04/23/22  4.  Patient to report 50-85% improvement in overall symptoms Baseline:  Goal status:ONGONG   5.  Patient to be able to ascend and descend steps with reciprocal gait safely Baseline:  Goal status:MET 04/04/22   PLAN:  PT FREQUENCY: 2x/week  PT DURATION: 8 weeks  PLANNED INTERVENTIONS: Therapeutic exercises, Therapeutic activity, Neuromuscular re-education, Balance training, Gait training, Patient/Family education, Self Care, Joint mobilization, Stair training, Aquatic Therapy, Dry Needling, Electrical stimulation, Cryotherapy, Moist heat, Splintting, Taping, Vasopneumatic device, Ultrasound, Ionotophoresis '4mg'$ /ml Dexamethasone, Manual therapy, and Re-evaluation  PLAN FOR NEXT SESSION:  We are recertifying to extend until surgery date to insure patient has good outcome following surgery.  We will do one aquatic visit and one land visit per week.     Anderson Malta B. Manasi Dishon, PT 04/29/22 5:07 PM  [facility] [address] [place], [place] [address] Phone # [phone] Fax [phone]

## 2022-05-01 ENCOUNTER — Encounter (HOSPITAL_BASED_OUTPATIENT_CLINIC_OR_DEPARTMENT_OTHER): Payer: Medicare Other | Admitting: Physical Therapy

## 2022-05-07 ENCOUNTER — Ambulatory Visit (HOSPITAL_BASED_OUTPATIENT_CLINIC_OR_DEPARTMENT_OTHER): Payer: Medicare Other | Admitting: Physical Therapy

## 2022-05-21 ENCOUNTER — Other Ambulatory Visit: Payer: Self-pay

## 2022-05-26 ENCOUNTER — Other Ambulatory Visit: Payer: Self-pay | Admitting: Physician Assistant

## 2022-05-26 DIAGNOSIS — Z01818 Encounter for other preprocedural examination: Secondary | ICD-10-CM

## 2022-05-27 ENCOUNTER — Ambulatory Visit: Payer: Medicare Other

## 2022-05-30 ENCOUNTER — Encounter (HOSPITAL_BASED_OUTPATIENT_CLINIC_OR_DEPARTMENT_OTHER): Payer: Self-pay | Admitting: Physical Therapy

## 2022-05-30 ENCOUNTER — Ambulatory Visit (HOSPITAL_BASED_OUTPATIENT_CLINIC_OR_DEPARTMENT_OTHER): Payer: Medicare Other | Attending: Orthopaedic Surgery | Admitting: Physical Therapy

## 2022-05-30 DIAGNOSIS — M25562 Pain in left knee: Secondary | ICD-10-CM | POA: Insufficient documentation

## 2022-05-30 DIAGNOSIS — M6281 Muscle weakness (generalized): Secondary | ICD-10-CM | POA: Insufficient documentation

## 2022-05-30 DIAGNOSIS — R262 Difficulty in walking, not elsewhere classified: Secondary | ICD-10-CM | POA: Insufficient documentation

## 2022-05-30 DIAGNOSIS — G8929 Other chronic pain: Secondary | ICD-10-CM | POA: Insufficient documentation

## 2022-05-30 NOTE — Therapy (Signed)
OUTPATIENT PHYSICAL THERAPY LOWER EXTREMITY RECERT NOTE  Patient Name: Krystal Lang MRN: [id number] DOB:28-Oct-1955, 67 y.o., female Today's Date: 05/30/2022  END OF SESSION:  PT End of Session - 05/30/22 1403     Visit Number 12    Date for PT Re-Evaluation 06/13/22    Authorization Type MEDICARE PART A AND B    Progress Note Due on Visit 20    PT Start Time 1346    PT Stop Time 1425    PT Time Calculation (min) 39 min    Activity Tolerance Patient tolerated treatment well    Behavior During Therapy WFL for tasks assessed/performed              Past Medical History:  Diagnosis Date   Cataract    Mixed form OU   Diabetes mellitus    Fibroid uterus 2001   H/O fatigue 1998   Headache(784.0)    Hirsutism 1998   Idiopathic   History of chicken pox    Hypothyroidism    Menses, irregular 1998   Past Surgical History:  Procedure Laterality Date   DILATION AND CURETTAGE OF UTERUS  2002   In [place]   KNEE SURGERY     TONSILECTOMY, ADENOIDECTOMY, BILATERAL MYRINGOTOMY AND TUBES     TONSILLECTOMY     Patient Active Problem List   Diagnosis Date Noted   Unilateral primary osteoarthritis, left knee 02/27/2022   Bilateral primary osteoarthritis of knee 05/11/2017   Dyspareunia, female 11/13/2011   Type 2 diabetes mellitus ([place]) 11/13/2011   Alopecia 11/13/2011   History of chicken pox    Headache(784.0)    Hypothyroidism    Menses, irregular    Hirsutism    H/O fatigue    Fibroid uterus     PCP: Dixie Dials, MD   REFERRING PROVIDER: Mcarthur Rossetti, MD  REFERRING DIAG: [phone] (ICD-10-CM) - Chronic pain of left knee  THERAPY DIAG:  Muscle weakness (generalized)  Chronic pain of left knee  Difficulty in walking, not elsewhere classified  Rationale for Evaluation and Treatment: Rehabilitation  ONSET DATE: 02/28/2022  SUBJECTIVE:   SUBJECTIVE STATEMENT: Patient reports she has surgery scheduled for March 19 for the left knee.  She  hopes to continue PT until that time.  She is having more pain in the posterior knee Left > Right.  She inquires about dry needling for the hamstrings bilaterally.      PERTINENT HISTORY: Prior surgery right knee PAIN:  Are you having pain? Yes: NPRS scale: 6/10 Pain location: Lt knee  Pain description: aching Aggravating factors: weight bearing, twisting Relieving factors: medication, rest  PRECAUTIONS: None  WEIGHT BEARING RESTRICTIONS: No  FALLS:  Has patient fallen in last 6 months? Yes. Number of falls 1  LIVING ENVIRONMENT: Lives with: lives with their family Lives in: House/apartment Stairs: Yes: Internal: 12 steps; can reach both and External: 5 steps; on left going up Has following equipment at home: None  OCCUPATION: retired  PLOF: Independent, Independent with basic ADLs, Independent with household mobility without device, Independent with community mobility without device, Independent with homemaking with ambulation, Independent with gait, and Independent with transfers  PATIENT GOALS: She would like to get the knee as strong as possible before she has surgery.    NEXT MD VISIT: 06/17/22  OBJECTIVE:   DIAGNOSTIC FINDINGS: IMPRESSION: 1. Radial tear involving the posterior horn of the medial meniscus at the meniscal root with detachment and medial protrusion of the meniscus. 2. Horizontal cleavage-type tear in the  midbody region of the medial meniscus. 3. Severe mucoid degeneration of the ACL. 4. Advanced tricompartmental degenerative changes as described above. 5. Large joint effusion and moderate-sized leaking Baker's cyst.  PATIENT SURVEYS:  FOTO 54 (goal is 66)  04/23/22:  FOTO 63  COGNITION: Overall cognitive status: Within functional limits for tasks assessed     SENSATION: WFL  POSTURE: No Significant postural limitations  LOWER EXTREMITY ROM:  Active ROM Right eval Left eval Left 04/29/22  Hip flexion     Hip extension     Hip  abduction     Hip adduction     Hip internal rotation     Hip external rotation     Knee flexion  95 100 (estimate)  Knee extension  -5 -5  (estimate)  Ankle dorsiflexion     Ankle plantarflexion     Ankle inversion     Ankle eversion      (Blank rows = not tested)  LOWER EXTREMITY MMT:  Generally 4- to 4/5 left LE 04/29/22: Still generally 4- to 4/5  LOWER EXTREMITY SPECIAL TESTS:  Knee special tests: deferred due to MRI findings  FUNCTIONAL TESTS:  04/23/22:   5 times sit to stand: 12.8s 04/23/22:  Timed up and go (TUG): 9.94s   GAIT: Distance walked: 30 Assistive device utilized: None Level of assistance: Complete Independence Comments: antalgic   TODAY'S TREATMENT:                                                                                                                              DATE: 05/30/22 Pt seen for aquatic therapy today.  Treatment took place in water 3.25-4.5 ft in depth at the [facility]. Temp of water was 91.  Pt entered (backwards)/exited (forward) the pool via stairs with supervision with bilat rail.  Holding wall: side stepping R/L  Holding wall: high knee marching in place; hip abdct/add x 10; bilat heel/ toe raisesx 10;  hip ext x 10 each  Holding barbell: walking forward/backward beside wall and side stepping, forward walking kick Holding wall:  heel raises x 10; squats x 10   STS on bench with feet on blue step with CGA/SBA steady (x 10)  R/L hamstring stretch with foot on 3rd step x 20s each x 2   Pt requires the buoyancy and hydrostatic pressure of water for support, and to offload joints by unweighting joint load by at least 50 % in navel deep water and by at least 75-80% in chest to neck deep water.  Viscosity of the water is needed for resistance of strengthening. Water current perturbations provides challenge to standing balance requiring increased core activation.  DATE: [id number] Recert visit and re-assessment Trigger Point  Dry-Needling  Treatment instructions: Expect mild to moderate muscle soreness. S/S of pneumothorax if dry needled over a lung field, and to seek immediate medical attention should they occur. Patient verbalized understanding of these instructions and education.  Patient Consent  Given: Yes Education handout provided: Yes Muscles treated: bilateral medial and lateral hamstrings Electrical stimulation performed: No Parameters: N/A Treatment response/outcome: Skilled palpation used to identify tight bands and trigger points in the bilateral hamstrings, once identified, these muscles were treated with dry needling techniques.  On all areas, twitch response and muscle elongation noted.  Patient reported some soreness following treatment.     DATE: 04/23/22  * TUG /  5xSTS / FOTO  Pt seen for aquatic therapy today.  Treatment took place in water 3.25-4.5 ft in depth at the [facility]. Temp of water was 91.  Pt entered (backwards)/exited (forward) the pool via stairs with supervision with bilat rail.  Holding wall: side stepping R/L;  bilat heel/ toe raises x 10;  hip abdct/add x 10; Hip ext x 10 each   RLE/ LLE  Forward step ups with eccentric lowering; LLE step downs, RLE retro step ups x 10 with UE on bilat rails STS on bench with feet in staggered stance (RLE back, LLE forward) on blue step with CGA/SBA steady (x 10)  Sitting on bench:  flutter kick, hip abdct/ addct, cycling LEs x 3 rounds    Pt requires the buoyancy and hydrostatic pressure of water for support, and to offload joints by unweighting joint load by at least 50 % in navel deep water and by at least 75-80% in chest to neck deep water.  Viscosity of the water is needed for resistance of strengthening. Water current perturbations provides challenge to standing balance requiring increased core activation.    DATE: 04/22/22 Pt seen for aquatic therapy today.  Treatment took place in water 3.25-4.5 ft in depth at the  [facility]. Temp of water was 91.  Pt entered (backwards)/exited (forward) the pool via stairs with supervision with bilat rail.  Holding wall: high knee marching in place; hip abdct/add x 10; bilat heel/ toe raisesx 10; hamstring curls alternating x 10; hip ext to toe tap x 10 each   LLE Forward step ups; RLE retro step downs x 10 withUE on wall STS on bench with feet on blue step with CGA/SBA steady (x 10)  Holding barbell with CGA: forward/ backward gait ( cues for heel strike/toe off);   side stepping R/L; marching backward/forward; tandem gait forward/backward; forward one step, backward one step x 10 leading with each LE for balance and work on changing direction   Pt requires the buoyancy and hydrostatic pressure of water for support, and to offload joints by unweighting joint load by at least 50 % in navel deep water and by at least 75-80% in chest to neck deep water.  Viscosity of the water is needed for resistance of strengthening. Water current perturbations provides challenge to standing balance requiring increased core activation.   PATIENT EDUCATION:  Education details: aquatics progressions/ modifications  Person educated: Patient Education method: Consulting civil engineer, Media planner, Corporate treasurer cues, Verbal cues Education comprehension: verbalized understanding, returned demonstration, verbal cues required, and tactile cues required  HOME EXERCISE PROGRAM: Access Code: [email or web address] URL: [email or web address] Date: 03/06/2022 Prepared by: Candyce Churn  Exercises - Supine Quadricep Sets  - 1 x daily - 7 x weekly - 3 sets - 10 reps - Small Range Straight Leg Raise  - 1 x daily - 7 x weekly - 3 sets - 10 reps - Supine Heel Slide  - 1 x daily - 7 x weekly - 3 sets - 10 reps - Seated Knee Extension Stretch with Chair  - 1 x daily -  7 x weekly - 1 sets - 5 reps - as long as you can hold  ASSESSMENT:  CLINICAL IMPRESSION: Pt initially guarded upon entering  water, but re-acclimates during session.  STS steadiness much improved from last session. She reported reduction of tightness, but pain remained unchanged. Pt has been away from therapy for one month due to vacation.  She would benefit from continuing skilled PT to prepare her for surgery to encourage successful outcome.      OBJECTIVE IMPAIRMENTS: difficulty walking, decreased ROM, decreased strength, increased fascial restrictions, increased muscle spasms, impaired flexibility, and pain.   ACTIVITY LIMITATIONS: carrying, lifting, bending, sitting, standing, squatting, sleeping, stairs, transfers, and dressing  PARTICIPATION LIMITATIONS: meal prep, cleaning, laundry, driving, shopping, community activity, and yard work  PERSONAL FACTORS: Age, Fitness, and Past/current experiences are also affecting patient's functional outcome.   REHAB POTENTIAL: Good  CLINICAL DECISION MAKING: Stable/uncomplicated  EVALUATION COMPLEXITY: Low   GOALS: Goals reviewed with patient? Yes  SHORT TERM GOALS: Target date: 04/02/2022   Pain report to be no greater than 4/10  Baseline: pain in Lt knee up to 10/10 Goal status: Ongoing - 04/23/22  2.  Patient will be independent with initial HEP  Baseline:  Goal status: MET   LONG TERM GOALS: Target date: 04/30/2022    Patient to report pain no greater than 2/10  Baseline:  Goal status: ONGOING - 04/23/22  2.  Patient to be independent with advanced HEP  Baseline:  Goal status: ONGOING   3.  FOTO score of 63 or better Baseline:  Goal status: MET - 04/23/22  4.  Patient to report 50-85% improvement in overall symptoms Baseline:  Goal status:ONGONG   5.  Patient to be able to ascend and descend steps with reciprocal gait safely Baseline:  Goal status:MET 04/04/22   PLAN:  PT FREQUENCY: 2x/week  PT DURATION: 8 weeks  PLANNED INTERVENTIONS: Therapeutic exercises, Therapeutic activity, Neuromuscular re-education, Balance training, Gait  training, Patient/Family education, Self Care, Joint mobilization, Stair training, Aquatic Therapy, Dry Needling, Electrical stimulation, Cryotherapy, Moist heat, Splintting, Taping, Vasopneumatic device, Ultrasound, Ionotophoresis '4mg'$ /ml Dexamethasone, Manual therapy, and Re-evaluation  PLAN FOR NEXT SESSION:  We are recertifying to extend until surgery date to insure patient has good outcome following surgery.  We will do one aquatic visit and one land visit per week.     Kerin Perna, PTA 05/30/22 2:35 PM [facility] [address] [place], [place], [address] Phone: [phone]   Fax:  [phone]

## 2022-06-02 ENCOUNTER — Telehealth: Payer: Self-pay | Admitting: Orthopaedic Surgery

## 2022-06-02 NOTE — Telephone Encounter (Signed)
Patient requesting to speak to Dr. Ninfa Linden prior to her surgery. Only thing I have is around the 25 of March.

## 2022-06-04 ENCOUNTER — Telehealth: Payer: Self-pay | Admitting: Orthopaedic Surgery

## 2022-06-04 ENCOUNTER — Ambulatory Visit: Payer: Medicare Other | Attending: Orthopaedic Surgery | Admitting: Physical Therapy

## 2022-06-04 ENCOUNTER — Encounter: Payer: Self-pay | Admitting: Physical Therapy

## 2022-06-04 DIAGNOSIS — R262 Difficulty in walking, not elsewhere classified: Secondary | ICD-10-CM | POA: Diagnosis present

## 2022-06-04 DIAGNOSIS — M25562 Pain in left knee: Secondary | ICD-10-CM | POA: Insufficient documentation

## 2022-06-04 DIAGNOSIS — G8929 Other chronic pain: Secondary | ICD-10-CM | POA: Diagnosis present

## 2022-06-04 DIAGNOSIS — M6281 Muscle weakness (generalized): Secondary | ICD-10-CM | POA: Insufficient documentation

## 2022-06-04 NOTE — Telephone Encounter (Signed)
Patient states she need to speak to dr. Ninfa Linden or Artis Delay ASAP she as some questions about her surgery.Krystal Lang

## 2022-06-04 NOTE — Telephone Encounter (Signed)
I called pt. She stated understanding. She stated she has a bakers cyst on her left knee and wants to know if you will do anything about that when you do her knee replacement. Please advise

## 2022-06-04 NOTE — Therapy (Signed)
OUTPATIENT PHYSICAL THERAPY LOWER EXTREMITY RECERT NOTE  Patient Name: Krystal Lang MRN: [id number] DOB:Mar 05, 1956, 67 y.o., female Today's Date: 06/04/2022  END OF SESSION:  PT End of Session - 06/04/22 1235     Visit Number 13    Date for PT Re-Evaluation 06/13/22    Authorization Type MEDICARE PART A AND B    Progress Note Due on Visit 20    PT Start Time [id number]    PT Stop Time 1322    PT Time Calculation (min) 47 min    Activity Tolerance Patient tolerated treatment well    Behavior During Therapy WFL for tasks assessed/performed               Past Medical History:  Diagnosis Date   Cataract    Mixed form OU   Diabetes mellitus    Fibroid uterus 2001   H/O fatigue 1998   Headache(784.0)    Hirsutism 1998   Idiopathic   History of chicken pox    Hypothyroidism    Menses, irregular 1998   Past Surgical History:  Procedure Laterality Date   DILATION AND CURETTAGE OF UTERUS  2002   In [place]   KNEE SURGERY     TONSILECTOMY, ADENOIDECTOMY, BILATERAL MYRINGOTOMY AND TUBES     TONSILLECTOMY     Patient Active Problem List   Diagnosis Date Noted   Unilateral primary osteoarthritis, left knee 02/27/2022   Bilateral primary osteoarthritis of knee 05/11/2017   Dyspareunia, female 11/13/2011   Type 2 diabetes mellitus ([place]) 11/13/2011   Alopecia 11/13/2011   History of chicken pox    Headache(784.0)    Hypothyroidism    Menses, irregular    Hirsutism    H/O fatigue    Fibroid uterus     PCP: Dixie Dials, MD   REFERRING PROVIDER: Mcarthur Rossetti, MD  REFERRING DIAG: [phone] (ICD-10-CM) - Chronic pain of left knee  THERAPY DIAG:  Muscle weakness (generalized)  Chronic pain of left knee  Difficulty in walking, not elsewhere classified  Rationale for Evaluation and Treatment: Rehabilitation  ONSET DATE: 02/28/2022  SUBJECTIVE:   SUBJECTIVE STATEMENT: My Lt leg is hurting a lot.  I am scared to exercise.     PERTINENT  HISTORY: Prior surgery right knee PAIN:  Are you having pain? Yes: NPRS scale: 7/10 Pain location: Lt knee  Pain description: aching Aggravating factors: weight bearing, twisting Relieving factors: medication, rest  PRECAUTIONS: None  WEIGHT BEARING RESTRICTIONS: No  FALLS:  Has patient fallen in last 6 months? Yes. Number of falls 1  LIVING ENVIRONMENT: Lives with: lives with their family Lives in: House/apartment Stairs: Yes: Internal: 12 steps; can reach both and External: 5 steps; on left going up Has following equipment at home: None  OCCUPATION: retired  PLOF: Independent, Independent with basic ADLs, Independent with household mobility without device, Independent with community mobility without device, Independent with homemaking with ambulation, Independent with gait, and Independent with transfers  PATIENT GOALS: She would like to get the knee as strong as possible before she has surgery.    NEXT MD VISIT: 06/17/22  OBJECTIVE:   DIAGNOSTIC FINDINGS: IMPRESSION: 1. Radial tear involving the posterior horn of the medial meniscus at the meniscal root with detachment and medial protrusion of the meniscus. 2. Horizontal cleavage-type tear in the midbody region of the medial meniscus. 3. Severe mucoid degeneration of the ACL. 4. Advanced tricompartmental degenerative changes as described above. 5. Large joint effusion and moderate-sized leaking Baker's cyst.  PATIENT  SURVEYS:  FOTO 54 (goal is 66)  04/23/22:  FOTO 63  COGNITION: Overall cognitive status: Within functional limits for tasks assessed     SENSATION: WFL  POSTURE: No Significant postural limitations  LOWER EXTREMITY ROM:  Active ROM Right eval Left eval Left 04/29/22  Hip flexion     Hip extension     Hip abduction     Hip adduction     Hip internal rotation     Hip external rotation     Knee flexion  95 100 (estimate)  Knee extension  -5 -5  (estimate)  Ankle dorsiflexion     Ankle  plantarflexion     Ankle inversion     Ankle eversion      (Blank rows = not tested)  LOWER EXTREMITY MMT:  Generally 4- to 4/5 left LE 04/29/22: Still generally 4- to 4/5  LOWER EXTREMITY SPECIAL TESTS:  Knee special tests: deferred due to MRI findings  FUNCTIONAL TESTS:  04/23/22:   5 times sit to stand: 12.8s 04/23/22:  Timed up and go (TUG): 9.94s   GAIT: Distance walked: 30 Assistive device utilized: None Level of assistance: Complete Independence Comments: antalgic   TODAY'S TREATMENT:                                                                                                                              06/04/22 Supine heel slides Lt x10 Quad sets Lt supine 10x3" holds Lt SLR x10 Supine knees on bolster ball squeeze 10x3" SAQ Lt 1x10 with ball squeeze SL hip abd Lt x10 Standing hip ext and abd at counter x10 Bil heel raise at counter x10 Ice Lt knee end of session x 5'  DATE: 05/30/22 Pt seen for aquatic therapy today.  Treatment took place in water 3.25-4.5 ft in depth at the [place] [facility]. Temp of water was 91.  Pt entered (backwards)/exited (forward) the pool via stairs with supervision with bilat rail.  Holding wall: side stepping R/L  Holding wall: high knee marching in place; hip abdct/add x 10; bilat heel/ toe raisesx 10;  hip ext x 10 each  Holding barbell: walking forward/backward beside wall and side stepping, forward walking kick Holding wall:  heel raises x 10; squats x 10   STS on bench with feet on blue step with CGA/SBA steady (x 10)  R/L hamstring stretch with foot on 3rd step x 20s each x 2   Pt requires the buoyancy and hydrostatic pressure of water for support, and to offload joints by unweighting joint load by at least 50 % in navel deep water and by at least 75-80% in chest to neck deep water.  Viscosity of the water is needed for resistance of strengthening. Water current perturbations provides challenge to standing balance  requiring increased core activation.  DATE: 123XX123 Recert visit and re-assessment Trigger Point Dry-Needling  Treatment instructions: Expect mild to moderate muscle soreness. S/S of pneumothorax if dry needled  over a lung field, and to seek immediate medical attention should they occur. Patient verbalized understanding of these instructions and education.  Patient Consent Given: Yes Education handout provided: Yes Muscles treated: bilateral medial and lateral hamstrings Electrical stimulation performed: No Parameters: N/A Treatment response/outcome: Skilled palpation used to identify tight bands and trigger points in the bilateral hamstrings, once identified, these muscles were treated with dry needling techniques.  On all areas, twitch response and muscle elongation noted.  Patient reported some soreness following treatment.      PATIENT EDUCATION:  Education details: aquatics progressions/ modifications  Person educated: Patient Education method: Consulting civil engineer, Media planner, Corporate treasurer cues, Verbal cues Education comprehension: verbalized understanding, returned demonstration, verbal cues required, and tactile cues required  HOME EXERCISE PROGRAM: Access Code: [email or web address] URL: [email or web address] Date: 06/04/2022 Prepared by: Venetia Night Kijuana Ruppel  Exercises - Small Range Straight Leg Raise  - 1 x daily - 7 x weekly - 3 sets - 10 reps - Seated Knee Extension Stretch with Chair  - 1 x daily - 7 x weekly - 1 sets - 5 reps - as long as you can hold - Supine Heel Slide  - 1 x daily - 7 x weekly - 3 sets - 10 reps - Supine Quadricep Sets  - 1 x daily - 7 x weekly - 1 sets - 10 reps - 5-10 sec hold - Supine Short Arc Quad  - 1 x daily - 7 x weekly - 1 sets - 10 reps - Sidelying Hip Abduction  - 1 x daily - 7 x weekly - 1 sets - 10 reps - Seated Hip Adduction Isometrics with Ball  - 1 x daily - 7 x weekly - 1 sets - 10 reps - 5-10 sec hold - Seated Hip Abduction with Resistance  -  1 x daily - 7 x weekly - 1 sets - 10 reps - Seated Hamstring Stretch  - 1 x daily - 7 x weekly - 1 sets - 2 reps - 30 sec hold - Heel Raises with Counter Support  - 1 x daily - 7 x weekly - 1 sets - 10 reps - Standing Hip Abduction with Counter Support  - 1 x daily - 7 x weekly - 1 sets - 10 reps - Standing Hip Extension with Counter Support  - 1 x daily - 7 x weekly - 1 sets - 10 reps  ASSESSMENT:  CLINICAL IMPRESSION: Pt arrives admitting fear of exercise and pain in Lt knee.  PT introduced Pt to a series of hip and knee strength that will be similar to post-op therex which was well tolerated.  She measures 85 deg knee flexion today (Rt knee measures 98 deg), limited by posterior knee pain.  HEP printed to reflect therex from session.    OBJECTIVE IMPAIRMENTS: difficulty walking, decreased ROM, decreased strength, increased fascial restrictions, increased muscle spasms, impaired flexibility, and pain.   ACTIVITY LIMITATIONS: carrying, lifting, bending, sitting, standing, squatting, sleeping, stairs, transfers, and dressing  PARTICIPATION LIMITATIONS: meal prep, cleaning, laundry, driving, shopping, community activity, and yard work  PERSONAL FACTORS: Age, Fitness, and Past/current experiences are also affecting patient's functional outcome.   REHAB POTENTIAL: Good  CLINICAL DECISION MAKING: Stable/uncomplicated  EVALUATION COMPLEXITY: Low   GOALS: Goals reviewed with patient? Yes  SHORT TERM GOALS: Target date: 04/02/2022   Pain report to be no greater than 4/10  Baseline: pain in Lt knee up to 10/10 Goal status: Ongoing - 04/23/22  2.  Patient will be independent with initial HEP  Baseline:  Goal status: MET   LONG TERM GOALS: Target date: 04/30/2022    Patient to report pain no greater than 2/10  Baseline:  Goal status: ONGOING - 04/23/22  2.  Patient to be independent with advanced HEP  Baseline:  Goal status: ONGOING   3.  FOTO score of 63 or better Baseline:   Goal status: MET - 04/23/22  4.  Patient to report 50-85% improvement in overall symptoms Baseline:  Goal status:ONGONG   5.  Patient to be able to ascend and descend steps with reciprocal gait safely Baseline:  Goal status:MET 04/04/22   PLAN:  PT FREQUENCY: 2x/week  PT DURATION: 8 weeks  PLANNED INTERVENTIONS: Therapeutic exercises, Therapeutic activity, Neuromuscular re-education, Balance training, Gait training, Patient/Family education, Self Care, Joint mobilization, Stair training, Aquatic Therapy, Dry Needling, Electrical stimulation, Cryotherapy, Moist heat, Splintting, Taping, Vasopneumatic device, Ultrasound, Ionotophoresis '4mg'$ /ml Dexamethasone, Manual therapy, and Re-evaluation  PLAN FOR NEXT SESSION:  follow up on land-based HEP given [id number], We are recertifying to extend until surgery date to insure patient has good outcome following surgery.  We will do one aquatic visit and one land visit per week.     Baruch Merl, PT 06/04/22 1:23 PM  [facility] [address] [place], [place], [address] Phone: [phone]   Fax:  [phone]

## 2022-06-05 ENCOUNTER — Encounter: Payer: Self-pay | Admitting: Radiology

## 2022-06-06 ENCOUNTER — Ambulatory Visit (HOSPITAL_BASED_OUTPATIENT_CLINIC_OR_DEPARTMENT_OTHER): Payer: Medicare Other | Admitting: Physical Therapy

## 2022-06-06 ENCOUNTER — Encounter (HOSPITAL_BASED_OUTPATIENT_CLINIC_OR_DEPARTMENT_OTHER): Payer: Self-pay | Admitting: Physical Therapy

## 2022-06-06 DIAGNOSIS — M6281 Muscle weakness (generalized): Secondary | ICD-10-CM

## 2022-06-06 DIAGNOSIS — R262 Difficulty in walking, not elsewhere classified: Secondary | ICD-10-CM

## 2022-06-06 DIAGNOSIS — G8929 Other chronic pain: Secondary | ICD-10-CM

## 2022-06-06 NOTE — Therapy (Signed)
OUTPATIENT PHYSICAL THERAPY LOWER EXTREMITY TREATMENT Patient Name: Krystal Lang MRN: [id number] DOB:05/20/1955, 67 y.o., female Today's Date: 06/06/2022  END OF SESSION:  PT End of Session - 06/06/22 1404     Visit Number 14    Date for PT Re-Evaluation 06/13/22    Authorization Type MEDICARE PART A AND B    Progress Note Due on Visit 20    PT Start Time 1347    PT Stop Time 1429    PT Time Calculation (min) 42 min               Past Medical History:  Diagnosis Date   Cataract    Mixed form OU   Diabetes mellitus    Fibroid uterus 2001   H/O fatigue 1998   Headache(784.0)    Hirsutism 1998   Idiopathic   History of chicken pox    Hypothyroidism    Menses, irregular 1998   Past Surgical History:  Procedure Laterality Date   DILATION AND CURETTAGE OF UTERUS  2002   In [place]   KNEE SURGERY     TONSILECTOMY, ADENOIDECTOMY, BILATERAL MYRINGOTOMY AND TUBES     TONSILLECTOMY     Patient Active Problem List   Diagnosis Date Noted   Unilateral primary osteoarthritis, left knee 02/27/2022   Bilateral primary osteoarthritis of knee 05/11/2017   Dyspareunia, female 11/13/2011   Type 2 diabetes mellitus ([place]) 11/13/2011   Alopecia 11/13/2011   History of chicken pox    Headache(784.0)    Hypothyroidism    Menses, irregular    Hirsutism    H/O fatigue    Fibroid uterus     PCP: Dixie Dials, MD   REFERRING PROVIDER: Mcarthur Rossetti, MD  REFERRING DIAG: [phone] (ICD-10-CM) - Chronic pain of left knee  THERAPY DIAG:  Muscle weakness (generalized)  Chronic pain of left knee  Difficulty in walking, not elsewhere classified  Rationale for Evaluation and Treatment: Rehabilitation  ONSET DATE: 02/28/2022  SUBJECTIVE:   SUBJECTIVE STATEMENT: "I am scared about the surgery".   Pt reports she had "a really good session" on land last visit.    PERTINENT HISTORY: Prior surgery right knee PAIN:  Are you having pain? Yes: NPRS scale:  3/10- Rt knee;  5/10 Lt knee  Pain location:see above Pain description: aching Aggravating factors: weight bearing, twisting Relieving factors: medication, rest  PRECAUTIONS: None  WEIGHT BEARING RESTRICTIONS: No  FALLS:  Has patient fallen in last 6 months? Yes. Number of falls 1  LIVING ENVIRONMENT: Lives with: lives with their family Lives in: House/apartment Stairs: Yes: Internal: 12 steps; can reach both and External: 5 steps; on left going up Has following equipment at home: None  OCCUPATION: retired  PLOF: Independent, Independent with basic ADLs, Independent with household mobility without device, Independent with community mobility without device, Independent with homemaking with ambulation, Independent with gait, and Independent with transfers  PATIENT GOALS: She would like to get the knee as strong as possible before she has surgery.    NEXT MD VISIT: 06/17/22  OBJECTIVE:   DIAGNOSTIC FINDINGS: IMPRESSION: 1. Radial tear involving the posterior horn of the medial meniscus at the meniscal root with detachment and medial protrusion of the meniscus. 2. Horizontal cleavage-type tear in the midbody region of the medial meniscus. 3. Severe mucoid degeneration of the ACL. 4. Advanced tricompartmental degenerative changes as described above. 5. Large joint effusion and moderate-sized leaking Baker's cyst.  PATIENT SURVEYS:  FOTO 54 (goal is 66)  04/23/22:  FOTO 50  COGNITION: Overall cognitive status: Within functional limits for tasks assessed     SENSATION: WFL  POSTURE: No Significant postural limitations  LOWER EXTREMITY ROM:  Active ROM Right eval Left eval Left 04/29/22  Hip flexion     Hip extension     Hip abduction     Hip adduction     Hip internal rotation     Hip external rotation     Knee flexion  95 100 (estimate)  Knee extension  -5 -5  (estimate)  Ankle dorsiflexion     Ankle plantarflexion     Ankle inversion     Ankle eversion       (Blank rows = not tested)  LOWER EXTREMITY MMT:  Generally 4- to 4/5 left LE 04/29/22: Still generally 4- to 4/5  LOWER EXTREMITY SPECIAL TESTS:  Knee special tests: deferred due to MRI findings  FUNCTIONAL TESTS:  04/23/22:   5 times sit to stand: 12.8s 04/23/22:  Timed up and go (TUG): 9.94s   GAIT: Distance walked: 30 Assistive device utilized: None Level of assistance: Complete Independence Comments: antalgic   TODAY'S TREATMENT:                                                                                                                              DATE: 06/06/22 Pt seen for aquatic therapy today.  Treatment took place in water 3.25-4.5 ft in depth at the [facility]. Temp of water was 91.  Pt entered (backwards)/exited (forward) the pool via stairs with supervision with bilat rail.  Holding wall: side stepping R/L  Holding barbell: walking forward/backward beside wall and side stepping Holding wall: high knee marching in place; hip abdct/add x 10; bilat heel/ toe raisesx 10;  hip ext x 10 each; hamstring curls x10  R/L lateral step ups x 10;  alternating forward step ups Holding rails at stairs   squats x 10;  single hand on wall/opp hand with yellow hand float - 3 way LE kick x 10 each  Return to walking forward with barbell - multiple widths   STS on bench with feet on blue step with supervision (x 13)  R/L hamstring stretch with foot on 2nd step x 15s each x 2   Pt requires the buoyancy and hydrostatic pressure of water for support, and to offload joints by unweighting joint load by at least 50 % in navel deep water and by at least 75-80% in chest to neck deep water.  Viscosity of the water is needed for resistance of strengthening. Water current perturbations provides challenge to standing balance requiring increased core activation.  06/04/22 Supine heel slides Lt x10 Quad sets Lt supine 10x3" holds Lt SLR x10 Supine knees on bolster ball squeeze  10x3" SAQ Lt 1x10 with ball squeeze SL hip abd Lt x10 Standing hip ext and abd at counter x10 Bil heel raise at counter x10 Ice Lt knee end of session  x 5'  DATE: 05/30/22 Pt seen for aquatic therapy today.  Treatment took place in water 3.25-4.5 ft in depth at the [facility]. Temp of water was 91.  Pt entered (backwards)/exited (forward) the pool via stairs with supervision with bilat rail.  Holding wall: side stepping R/L  Holding wall: high knee marching in place; hip abdct/add x 10; bilat heel/ toe raisesx 10;  hip ext x 10 each  Holding barbell: walking forward/backward beside wall and side stepping, forward walking kick Holding wall:  heel raises x 10; squats x 10   STS on bench with feet on blue step with CGA/SBA steady (x 10)  R/L hamstring stretch with foot on 3rd step x 20s each x 2   Pt requires the buoyancy and hydrostatic pressure of water for support, and to offload joints by unweighting joint load by at least 50 % in navel deep water and by at least 75-80% in chest to neck deep water.  Viscosity of the water is needed for resistance of strengthening. Water current perturbations provides challenge to standing balance requiring increased core activation.  DATE: 123XX123 Recert visit and re-assessment Trigger Point Dry-Needling  Treatment instructions: Expect mild to moderate muscle soreness. S/S of pneumothorax if dry needled over a lung field, and to seek immediate medical attention should they occur. Patient verbalized understanding of these instructions and education.  Patient Consent Given: Yes Education handout provided: Yes Muscles treated: bilateral medial and lateral hamstrings Electrical stimulation performed: No Parameters: N/A Treatment response/outcome: Skilled palpation used to identify tight bands and trigger points in the bilateral hamstrings, once identified, these muscles were treated with dry needling techniques.  On all areas, twitch response  and muscle elongation noted.  Patient reported some soreness following treatment.      PATIENT EDUCATION:  Education details: aquatics progressions/ modifications  Person educated: Patient Education method: Consulting civil engineer, Media planner, Corporate treasurer cues, Verbal cues Education comprehension: verbalized understanding, returned demonstration, verbal cues required, and tactile cues required  HOME EXERCISE PROGRAM: Access Code: [email or web address] URL: [email or web address] Date: 06/04/2022 Prepared by: Venetia Night Beuhring  Exercises - Small Range Straight Leg Raise  - 1 x daily - 7 x weekly - 3 sets - 10 reps - Seated Knee Extension Stretch with Chair  - 1 x daily - 7 x weekly - 1 sets - 5 reps - as long as you can hold - Supine Heel Slide  - 1 x daily - 7 x weekly - 3 sets - 10 reps - Supine Quadricep Sets  - 1 x daily - 7 x weekly - 1 sets - 10 reps - 5-10 sec hold - Supine Short Arc Quad  - 1 x daily - 7 x weekly - 1 sets - 10 reps - Sidelying Hip Abduction  - 1 x daily - 7 x weekly - 1 sets - 10 reps - Seated Hip Adduction Isometrics with Ball  - 1 x daily - 7 x weekly - 1 sets - 10 reps - 5-10 sec hold - Seated Hip Abduction with Resistance  - 1 x daily - 7 x weekly - 1 sets - 10 reps - Seated Hamstring Stretch  - 1 x daily - 7 x weekly - 1 sets - 2 reps - 30 sec hold - Heel Raises with Counter Support  - 1 x daily - 7 x weekly - 1 sets - 10 reps - Standing Hip Abduction with Counter Support  - 1 x daily - 7 x weekly - 1 sets - 10  reps - Standing Hip Extension with Counter Support  - 1 x daily - 7 x weekly - 1 sets - 10 reps  ASSESSMENT:  CLINICAL IMPRESSION: Pt's confidence in water improving. Pt able to walk across width of pool multiple widths with support of barbell and supervision.  She reported no increase in pain during session.  Improved tolerance for STS.  Progressing gradually towards remaining goals. Printed post-op PT schedule for pt and issued today.     OBJECTIVE  IMPAIRMENTS: difficulty walking, decreased ROM, decreased strength, increased fascial restrictions, increased muscle spasms, impaired flexibility, and pain.   ACTIVITY LIMITATIONS: carrying, lifting, bending, sitting, standing, squatting, sleeping, stairs, transfers, and dressing  PARTICIPATION LIMITATIONS: meal prep, cleaning, laundry, driving, shopping, community activity, and yard work  PERSONAL FACTORS: Age, Fitness, and Past/current experiences are also affecting patient's functional outcome.   REHAB POTENTIAL: Good  CLINICAL DECISION MAKING: Stable/uncomplicated  EVALUATION COMPLEXITY: Low   GOALS: Goals reviewed with patient? Yes  SHORT TERM GOALS: Target date: 04/02/2022   Pain report to be no greater than 4/10  Baseline: pain in Lt knee up to 10/10 Goal status: Ongoing - 04/23/22  2.  Patient will be independent with initial HEP  Baseline:  Goal status: MET   LONG TERM GOALS: Target date: 04/30/2022    Patient to report pain no greater than 2/10  Baseline:  Goal status: ONGOING - 04/23/22  2.  Patient to be independent with advanced HEP  Baseline:  Goal status: ONGOING   3.  FOTO score of 63 or better Baseline:  Goal status: MET - 04/23/22  4.  Patient to report 50-85% improvement in overall symptoms Baseline:  Goal status:ONGONG   5.  Patient to be able to ascend and descend steps with reciprocal gait safely Baseline:  Goal status:MET 04/04/22   PLAN:  PT FREQUENCY: 2x/week  PT DURATION: 8 weeks  PLANNED INTERVENTIONS: Therapeutic exercises, Therapeutic activity, Neuromuscular re-education, Balance training, Gait training, Patient/Family education, Self Care, Joint mobilization, Stair training, Aquatic Therapy, Dry Needling, Electrical stimulation, Cryotherapy, Moist heat, Splintting, Taping, Vasopneumatic device, Ultrasound, Ionotophoresis '4mg'$ /ml Dexamethasone, Manual therapy, and Re-evaluation  PLAN FOR NEXT SESSION:  follow up on land-based HEP given  [id number], We are recertifying to extend until surgery date to insure patient has good outcome following surgery.  We will do one aquatic visit and one land visit per week.     Kerin Perna, PTA 06/06/22 2:40 PM [facility] [address] [place], [place], [address] Phone: [phone]   Fax:  [phone]

## 2022-06-09 ENCOUNTER — Telehealth (HOSPITAL_BASED_OUTPATIENT_CLINIC_OR_DEPARTMENT_OTHER): Payer: Self-pay | Admitting: Physical Therapy

## 2022-06-09 NOTE — Telephone Encounter (Signed)
[name] called last week requesting a call from a PT regarding upcoming knee surgery. I LVM on her phone this AM letting her know I am available until 9:30 or to LVM on my line and I will get back to her tomorrow.  Shelitha Magley C. Lynnox Girten PT, DPT 06/09/22 9:06 AM

## 2022-06-10 ENCOUNTER — Ambulatory Visit: Payer: Medicare Other

## 2022-06-11 NOTE — Pre-Procedure Instructions (Signed)
Surgical Instructions    Your procedure is scheduled on June 17, 2022.  Report to [facility] Main Entrance "A" at 12:00 P.M., then check in with the Admitting office.  Call this number if you have problems the morning of surgery:  [phone]  If you have any questions prior to your surgery date call [phone]: Open Monday-Friday 8am-4pm If you experience any cold or flu symptoms such as cough, fever, chills, shortness of breath, etc. between now and your scheduled surgery, please notify us at the above number.     Remember:  Do not eat after midnight the night before your surgery  You may drink clear liquids until 11:00 AM the morning of your surgery.   Clear liquids allowed are: Water, Non-Citrus Juices (without pulp), Carbonated Beverages, Clear Tea, Black Coffee Only (NO MILK, CREAM OR POWDERED CREAMER of any kind), and Gatorade.  Patient Instructions  The night before surgery:  No food after midnight. ONLY clear liquids after midnight  The day of surgery (if you have diabetes): Drink ONE (1) 12 oz G2 given to you in your pre admission testing appointment by 11:00 AM the morning of surgery. Drink in one sitting. Do not sip.  This drink was given to you during your hospital  pre-op appointment visit.  Nothing else to drink after completing the  12 oz bottle of G2.         If you have questions, please contact your surgeon's office.      Take these medicines the morning of surgery with A SIP OF WATER:  atorvastatin (LIPITOR)   levothyroxine (SYNTHROID)    As of today, STOP taking any Aspirin (unless otherwise instructed by your surgeon) Aleve, Naproxen, Ibuprofen, Motrin, Advil, Goody's, BC's, all herbal medications, fish oil, and all vitamins.   WHAT DO I DO ABOUT MY DIABETES MEDICATION?   Do not take glimepiride (AMARYL) or sitaGLIPtin-metformin (JANUMET) the morning of surgery.  STOP taking empagliflozin (JARDIANCE) three days prior to surgery. Your last dose  of this medication will be March 15th.      HOW TO MANAGE YOUR DIABETES BEFORE AND AFTER SURGERY  Why is it important to control my blood sugar before and after surgery? Improving blood sugar levels before and after surgery helps healing and can limit problems. A way of improving blood sugar control is eating a healthy diet by:  Eating less sugar and carbohydrates  Increasing activity/exercise  Talking with your doctor about reaching your blood sugar goals High blood sugars (greater than 180 mg/dL) can raise your risk of infections and slow your recovery, so you will need to focus on controlling your diabetes during the weeks before surgery. Make sure that the doctor who takes care of your diabetes knows about your planned surgery including the date and location.  How do I manage my blood sugar before surgery? Check your blood sugar at least 4 times a day, starting 2 days before surgery, to make sure that the level is not too high or low.  Check your blood sugar the morning of your surgery when you wake up and every 2 hours until you get to the Short Stay unit.  If your blood sugar is less than 70 mg/dL, you will need to treat for low blood sugar: Do not take insulin. Treat a low blood sugar (less than 70 mg/dL) with  cup of clear juice (cranberry or apple), 4 glucose tablets, OR glucose gel. Recheck blood sugar in 15 minutes after treatment (to make sure  it is greater than 70 mg/dL). If your blood sugar is not greater than 70 mg/dL on recheck, call [phone] for further instructions. Report your blood sugar to the short stay nurse when you get to Short Stay.  If you are admitted to the hospital after surgery: Your blood sugar will be checked by the staff and you will probably be given insulin after surgery (instead of oral diabetes medicines) to make sure you have good blood sugar levels. The goal for blood sugar control after surgery is 80-180 mg/dL.                      Do NOT  Smoke (Tobacco/Vaping) for 24 hours prior to your procedure.  If you use a CPAP at night, you may bring your mask/headgear for your overnight stay.   Contacts, glasses, piercing's, hearing aid's, dentures or partials may not be worn into surgery, please bring cases for these belongings.    For patients admitted to the hospital, discharge time will be determined by your treatment team.   Patients discharged the day of surgery will not be allowed to drive home, and someone needs to stay with them for 24 hours.  SURGICAL WAITING ROOM VISITATION Patients having surgery or a procedure may have no more than 2 support people in the waiting area - these visitors may rotate.   Children under the age of 28 must have an adult with them who is not the patient. If the patient needs to stay at the hospital during part of their recovery, the visitor guidelines for inpatient rooms apply. Pre-op nurse will coordinate an appropriate time for 1 support person to accompany patient in pre-op.  This support person may not rotate.   Please refer to the [facility] website for the visitor guidelines for Inpatients (after your surgery is over and you are in a regular room).    Special instructions:   [facility]- Preparing For Surgery  Before surgery, you can play an important role. Because skin is not sterile, your skin needs to be as free of germs as possible. You can reduce the number of germs on your skin by washing with CHG (chlorahexidine gluconate) Soap before surgery.  CHG is an antiseptic cleaner which kills germs and bonds with the skin to continue killing germs even after washing.    Oral Hygiene is also important to reduce your risk of infection.  Remember - BRUSH YOUR TEETH THE MORNING OF SURGERY WITH YOUR REGULAR TOOTHPASTE  Please do not use if you have an allergy to CHG or antibacterial soaps. If your skin becomes reddened/irritated stop using the CHG.  Do not shave (including legs and underarms)  for at least 48 hours prior to first CHG shower. It is OK to shave your face.  Please follow these instructions carefully.   Shower the NIGHT BEFORE SURGERY and the MORNING OF SURGERY  If you chose to wash your hair, wash your hair first as usual with your normal shampoo.  After you shampoo, rinse your hair and body thoroughly to remove the shampoo.  Use CHG Soap as you would any other liquid soap. You can apply CHG directly to the skin and wash gently with a scrungie or a clean washcloth.   Apply the CHG Soap to your body ONLY FROM THE NECK DOWN.  Do not use on open wounds or open sores. Avoid contact with your eyes, ears, mouth and genitals (private parts). Wash Face and genitals (private parts)  with  your normal soap.   Wash thoroughly, paying special attention to the area where your surgery will be performed.  Thoroughly rinse your body with warm water from the neck down.  DO NOT shower/wash with your normal soap after using and rinsing off the CHG Soap.  Pat yourself dry with a CLEAN TOWEL.  Wear CLEAN PAJAMAS to bed the night before surgery  Place CLEAN SHEETS on your bed the night before your surgery  DO NOT SLEEP WITH PETS.   Day of Surgery: Take a shower with CHG soap. Do not wear jewelry or makeup Do not wear lotions, powders, perfumes/colognes, or deodorant. Do not shave 48 hours prior to surgery.  Men may shave face and neck. Do not bring valuables to the hospital.  [facility] is not responsible for any belongings or valuables. Do not wear nail polish, gel polish, artificial nails, or any other type of covering on natural nails (fingers and toes) If you have artificial nails or gel coating that need to be removed by a nail salon, please have this removed prior to surgery. Artificial nails or gel coating may interfere with anesthesia's ability to adequately monitor your vital signs.  Wear Clean/Comfortable clothing the morning of surgery Remember to brush your  teeth WITH YOUR REGULAR TOOTHPASTE.   Please read over the following fact sheets that you were given.    If you received a COVID test during your pre-op visit  it is requested that you wear a mask when out in public, stay away from anyone that may not be feeling well and notify your surgeon if you develop symptoms. If you have been in contact with anyone that has tested positive in the last 10 days please notify you surgeon.

## 2022-06-12 ENCOUNTER — Encounter (HOSPITAL_COMMUNITY)
Admission: RE | Admit: 2022-06-12 | Discharge: 2022-06-12 | Disposition: A | Discharge status: Home / Self Care | Payer: Medicare Other | Source: Ambulatory Visit | Attending: Orthopaedic Surgery | Admitting: Orthopaedic Surgery

## 2022-06-12 ENCOUNTER — Other Ambulatory Visit: Payer: Self-pay

## 2022-06-12 ENCOUNTER — Encounter (HOSPITAL_COMMUNITY): Payer: Self-pay

## 2022-06-12 ENCOUNTER — Encounter (HOSPITAL_COMMUNITY): Payer: Self-pay | Admitting: Physician Assistant

## 2022-06-12 DIAGNOSIS — Z01818 Encounter for other preprocedural examination: Secondary | ICD-10-CM | POA: Insufficient documentation

## 2022-06-12 HISTORY — DX: Anxiety disorder, unspecified: F41.9

## 2022-06-12 HISTORY — DX: Unspecified osteoarthritis, unspecified site: M19.90

## 2022-06-12 LAB — SURGICAL PCR SCREEN
MRSA, PCR: NEGATIVE
Staphylococcus aureus: NEGATIVE

## 2022-06-12 LAB — CBC
HCT: 42 % (ref 36.0–46.0)
Hemoglobin: 13 g/dL (ref 12.0–15.0)
MCH: 28.3 pg (ref 26.0–34.0)
MCHC: 31 g/dL (ref 30.0–36.0)
MCV: 91.5 fL (ref 80.0–100.0)
Platelets: 256 10*3/uL (ref 150–400)
RBC: 4.59 MIL/uL (ref 3.87–5.11)
RDW: 14.8 % (ref 11.5–15.5)
WBC: 7.5 10*3/uL (ref 4.0–10.5)
nRBC: 0 % (ref 0.0–0.2)

## 2022-06-12 LAB — COMPREHENSIVE METABOLIC PANEL
ALT: 25 U/L (ref 0–44)
AST: 22 U/L (ref 15–41)
Albumin: 4.1 g/dL (ref 3.5–5.0)
Alkaline Phosphatase: 66 U/L (ref 38–126)
Anion gap: 11 (ref 5–15)
BUN: 24 mg/dL — ABNORMAL HIGH (ref 8–23)
CO2: 24 mmol/L (ref 22–32)
Calcium: 9.5 mg/dL (ref 8.9–10.3)
Chloride: 106 mmol/L (ref 98–111)
Creatinine, Ser: 1.19 mg/dL — ABNORMAL HIGH (ref 0.44–1.00)
GFR, Estimated: 50 mL/min — ABNORMAL LOW (ref >60)
Glucose, Bld: 143 mg/dL — ABNORMAL HIGH (ref 70–99)
Potassium: 4.4 mmol/L (ref 3.5–5.1)
Sodium: 141 mmol/L (ref 135–145)
Total Bilirubin: 1.1 mg/dL (ref 0.3–1.2)
Total Protein: 7 g/dL (ref 6.5–8.1)

## 2022-06-12 LAB — TYPE AND SCREEN
ABO/RH(D): A POS
Antibody Screen: NEGATIVE

## 2022-06-12 LAB — GLUCOSE, CAPILLARY: Glucose-Capillary: 137 mg/dL — ABNORMAL HIGH (ref 70–99)

## 2022-06-12 NOTE — Progress Notes (Addendum)
PCP - Dr. Dixie Dials (Pt is in the process of becoming a pt with Dr. Dorthy Cooler with [facility]. Her appointment is in May 2024) Cardiologist - Denies Endocrinologist - Dr. Hassan Buckler  PPM/ICD - Denies Device Orders - n/a Rep Notified - n/a  Chest x-ray - n/a EKG - 06/12/2022 Stress Test - Denies ECHO - Denies Cardiac Cath - Denies  Sleep Study - Denies CPAP - n/a  Pt is DM2. She wears a Libre 3 on her left arm. Normal fasting blood sugar is 110-120s. CBG at pre-op appointment was 137. Pt states she has had "junk food" so far today. A1c requested from patients Endocrinologist  Last dose of GLP1 agonist- n/a GLP1 instructions: n/a  Blood Thinner Instructions: n/a Aspirin Instructions: n/a  ERAS Protcol - Clear liquids until 1100 morning of surgery PRE-SURGERY Ensure or G2- G2 given to pt with instructions  COVID TEST- n/a   Anesthesia review: Yes. A1c result requested from pts Endocrinologist. Pt does endorse an itchy throat that started 4-5 days ago while cleaning her house. Around 2 days after that she developed a very mild runny nose with no overt drainage and a cough that was non-productive. Today she still has an itchy throat occasionally and a cough, but symptoms have not worsened. She did take one dose of Benadryl on Tuesday, 3/12, which did improve her symptoms. Discussed with Karoline Caldwell, PA-C. Recommended pt to start an OTC allergy medicine today and APP will follow-up with patient tomorrow in regards to her symptoms.   Patient denies shortness of breath, fever, cough and chest pain at PAT appointment.    All instructions explained to the patient, with a verbal understanding of the material. Patient agrees to go over the instructions while at home for a better understanding. Patient also instructed to self quarantine after being tested for COVID-19. The opportunity to ask questions was provided.

## 2022-06-13 ENCOUNTER — Telehealth: Payer: Self-pay

## 2022-06-13 ENCOUNTER — Ambulatory Visit (HOSPITAL_BASED_OUTPATIENT_CLINIC_OR_DEPARTMENT_OTHER): Payer: Medicare Other | Admitting: Physical Therapy

## 2022-06-13 ENCOUNTER — Telehealth (HOSPITAL_BASED_OUTPATIENT_CLINIC_OR_DEPARTMENT_OTHER): Payer: Self-pay | Admitting: Physical Therapy

## 2022-06-13 NOTE — Telephone Encounter (Signed)
Patient did not show for aquatic PT appointment.  This was her last scheduled appointment prior to her upcoming surgery.   Called and left voice mail for patient - informing patient that a referral is needed from Dr. Ninfa Linden to initiate PT after her upcoming surgery.  Requested patient call Dr. Trevor Mace office and have them send referral to Outpatient Rehab at [facility] prior to 3/22.  Left office number for her, if she has any other questions and concerns.    Kerin Perna, PTA 06/13/22 2:21 PM [facility] [address] [place], [place], [address] Phone: [phone]   Fax:  [phone]

## 2022-06-13 NOTE — Telephone Encounter (Signed)
Patient is sick with respiratory illness.  Has gotten worse.  Need to cancel surgery for 03/19 and reschedule for at least 4 weeks.

## 2022-06-13 NOTE — Progress Notes (Signed)
Anesthesia Chart Review:  Patient was seen for preoperative testing appointment on 06/12/2022 and reported some upper respiratory symptoms of congestion and cough.  She stated they were mild, possibly consistent with allergies.  I advised her I would follow-up the following day to make sure that these were not evolving into a more obvious URI.  I spoke with her via phone on 06/13/2022 and she reported that her cough is now worse, states she barely slept last night due to persistent coughing and is continuing to cough today.  I advised her symptoms are now consistent with URI and that the recommendation of anesthesia would be to postpone 4 weeks to allow for complete resolution.  Patient was understanding and agreed with this recommendation.  I have called Dr. Trevor Mace office to advise patient will need to be postponed.   Krystal Lang [facility] Short Stay Center/Anesthesiology Phone [phone] 06/13/2022 2:41 PM

## 2022-06-18 ENCOUNTER — Encounter (HOSPITAL_COMMUNITY): Admission: RE | Payer: Self-pay | Source: Home / Self Care

## 2022-06-18 ENCOUNTER — Ambulatory Visit (HOSPITAL_COMMUNITY): Admission: RE | Admit: 2022-06-18 | Payer: Medicare Other | Source: Home / Self Care | Admitting: Orthopaedic Surgery

## 2022-06-18 SURGERY — ARTHROPLASTY, KNEE, TOTAL
Anesthesia: Choice | Site: Knee | Laterality: Left

## 2022-06-19 NOTE — Telephone Encounter (Signed)
Patient has been rescheduled for surgery on 07/24/22.

## 2022-06-20 ENCOUNTER — Ambulatory Visit (HOSPITAL_BASED_OUTPATIENT_CLINIC_OR_DEPARTMENT_OTHER): Payer: Medicare Other | Admitting: Physical Therapy

## 2022-06-26 ENCOUNTER — Encounter (HOSPITAL_BASED_OUTPATIENT_CLINIC_OR_DEPARTMENT_OTHER): Payer: Medicare Other | Admitting: Physical Therapy

## 2022-06-30 ENCOUNTER — Encounter: Payer: Medicare Other | Admitting: Orthopaedic Surgery

## 2022-07-04 ENCOUNTER — Encounter (HOSPITAL_BASED_OUTPATIENT_CLINIC_OR_DEPARTMENT_OTHER): Payer: Medicare Other | Admitting: Physical Therapy

## 2022-07-11 ENCOUNTER — Encounter (HOSPITAL_BASED_OUTPATIENT_CLINIC_OR_DEPARTMENT_OTHER): Payer: Medicare Other | Admitting: Physical Therapy

## 2022-07-11 NOTE — Progress Notes (Signed)
Sent message, via epic in basket, requesting orders in epic from surgeon.  

## 2022-07-14 ENCOUNTER — Telehealth: Payer: Self-pay | Admitting: Orthopaedic Surgery

## 2022-07-14 ENCOUNTER — Other Ambulatory Visit: Payer: Self-pay | Admitting: Physician Assistant

## 2022-07-14 DIAGNOSIS — Z01818 Encounter for other preprocedural examination: Secondary | ICD-10-CM

## 2022-07-14 NOTE — Telephone Encounter (Signed)
Patient states she has spoken to her Dentist, she was told to take an antibiotic and use mouth was also told to take foe 1 week. Please call patient

## 2022-07-14 NOTE — Telephone Encounter (Signed)
Patient aware.

## 2022-07-14 NOTE — Telephone Encounter (Signed)
LMOM for patient of the below message from [facility]  

## 2022-07-14 NOTE — Telephone Encounter (Signed)
Patient request a call back, states she is scheduled for surgery but is having some swelling and pain in her gums and wanted to take some antibiotics wasn't' sure if she could or should take.

## 2022-07-15 ENCOUNTER — Encounter (HOSPITAL_BASED_OUTPATIENT_CLINIC_OR_DEPARTMENT_OTHER): Payer: Medicare Other | Admitting: Physical Therapy

## 2022-07-16 ENCOUNTER — Telehealth: Payer: Self-pay

## 2022-07-16 NOTE — Telephone Encounter (Signed)
Per Magnus Ivan we need to cancel her and schedule her 3 weeks later. She went to the dentist and had an abscess

## 2022-07-17 NOTE — Patient Instructions (Addendum)
DUE TO COVID-19 ONLY TWO VISITORS  (aged 68 and older)  ARE ALLOWED TO COME WITH YOU AND STAY IN THE WAITING ROOM ONLY DURING PRE OP AND PROCEDURE.   **NO VISITORS ARE ALLOWED IN THE SHORT STAY AREA OR RECOVERY ROOM!!**  IF YOU WILL BE ADMITTED INTO THE HOSPITAL YOU ARE ALLOWED ONLY FOUR SUPPORT PEOPLE DURING VISITATION HOURS ONLY (7 AM -8PM)   The support person(s) must pass our screening, gel in and out, and wear a mask at all times, including in the patient's room. Patients must also wear a mask when staff or their support person are in the room. Visitors GUEST BADGE MUST BE WORN VISIBLY  One adult visitor may remain with you overnight and MUST be in the room by 8 P.M.     Your procedure is scheduled on: 07/24/22   Report to [facility] Main Entrance    Report to admitting at : 7:15 AM   Call this number if you have problems the morning of surgery [phone]   Do not eat food :After Midnight.   After Midnight you may have the following liquids until : 6:45 AM DAY OF SURGERY  Water Black Coffee (sugar ok, NO MILK/CREAM OR CREAMERS)  Tea (sugar ok, NO MILK/CREAM OR CREAMERS) regular and decaf                             Plain Jell-O (NO RED)                                           Fruit ices (not with fruit pulp, NO RED)                                     Popsicles (NO RED)                                                                  Juice: apple, WHITE grape, WHITE cranberry Sports drinks like Gatorade (NO RED)                The day of surgery:  Drink ONE (1) Pre-Surgery Clear G2 at: 6:45 AM the morning of surgery. Drink in one sitting. Do not sip.  This drink was given to you during your hospital  pre-op appointment visit. Nothing else to drink after completing the  Pre-Surgery Clear Ensure or G2.          If you have questions, please contact your surgeon's office.  Oral Hygiene is also important to reduce your risk of infection.                                     Remember - BRUSH YOUR TEETH THE MORNING OF SURGERY WITH YOUR REGULAR TOOTHPASTE  DENTURES WILL BE REMOVED PRIOR TO SURGERY PLEASE DO NOT APPLY "Poly grip" OR ADHESIVES!!!   Do NOT smoke after Midnight   Take these medicines the morning of surgery with A  SIP OF WATER: levothyroxine.  How to Manage Your Diabetes Before and After Surgery  Why is it important to control my blood sugar before and after surgery? Improving blood sugar levels before and after surgery helps healing and can limit problems. A way of improving blood sugar control is eating a healthy diet by:  Eating less sugar and carbohydrates  Increasing activity/exercise  Talking with your doctor about reaching your blood sugar goals High blood sugars (greater than 180 mg/dL) can raise your risk of infections and slow your recovery, so you will need to focus on controlling your diabetes during the weeks before surgery. Make sure that the doctor who takes care of your diabetes knows about your planned surgery including the date and location.  How do I manage my blood sugar before surgery? Check your blood sugar at least 4 times a day, starting 2 days before surgery, to make sure that the level is not too high or low. Check your blood sugar the morning of your surgery when you wake up and every 2 hours until you get to the Short Stay unit. If your blood sugar is less than 70 mg/dL, you will need to treat for low blood sugar: Do not take insulin. Treat a low blood sugar (less than 70 mg/dL) with  cup of clear juice (cranberry or apple), 4 glucose tablets, OR glucose gel. Recheck blood sugar in 15 minutes after treatment (to make sure it is greater than 70 mg/dL). If your blood sugar is not greater than 70 mg/dL on recheck, call [phone] for further instructions. Report your blood sugar to the short stay nurse when you get to Short Stay.  If you are admitted to the hospital after surgery: Your blood sugar will be  checked by the staff and you will probably be given insulin after surgery (instead of oral diabetes medicines) to make sure you have good blood sugar levels. The goal for blood sugar control after surgery is 80-180 mg/dL.   WHAT DO I DO ABOUT MY DIABETES MEDICATION?  HOLD Jardiance 72 hours before surgery. (Last dose:07/20/22)  THE MORNING BEFORE SURGERY, take glimepiride and sitagliptin ONLY in the morning. DO NOT take night dose.     THE MORNING OF SURGERY, DO NOT TAKE ANY ORAL DIABETIC MEDICATIONS DAY OF YOUR SURGERY                              You may not have any metal on your body including hair pins, jewelry, and body piercing             Do not wear make-up, lotions, powders, perfumes/cologne, or deodorant  Do not wear nail polish including gel and S&S, artificial/acrylic nails, or any other type of covering on natural nails including finger and toenails. If you have artificial nails, gel coating, etc. that needs to be removed by a nail salon please have this removed prior to surgery or surgery may need to be canceled/ delayed if the surgeon/ anesthesia feels like they are unable to be safely monitored.   Do not shave  48 hours prior to surgery.    Do not bring valuables to the hospital. [facility] IS NOT             RESPONSIBLE   FOR VALUABLES.   Contacts, glasses, or bridgework may not be worn into surgery.   Bring small overnight bag day of surgery.   DO NOT BRING YOUR  HOME MEDICATIONS TO THE HOSPITAL. PHARMACY WILL DISPENSE MEDICATIONS LISTED ON YOUR MEDICATION LIST TO YOU DURING YOUR ADMISSION IN THE HOSPITAL!    Patients discharged on the day of surgery will not be allowed to drive home.  Someone NEEDS to stay with you for the first 24 hours after anesthesia.   Special Instructions: Bring a copy of your healthcare power of attorney and living will documents         the day of surgery if you haven't scanned them before.              Please read over the following fact  sheets you were given: IF YOU HAVE QUESTIONS ABOUT YOUR PRE-OP INSTRUCTIONS PLEASE CALL [phone]      Incentive Spirometer  An incentive spirometer is a tool that can help keep your lungs clear and active. This tool measures how well you are filling your lungs with each breath. Taking long deep breaths may help reverse or decrease the chance of developing breathing (pulmonary) problems (especially infection) following: A long period of time when you are unable to move or be active. BEFORE THE PROCEDURE  If the spirometer includes an indicator to show your best effort, your nurse or respiratory therapist will set it to a desired goal. If possible, sit up straight or lean slightly forward. Try not to slouch. Hold the incentive spirometer in an upright position. INSTRUCTIONS FOR USE  Sit on the edge of your bed if possible, or sit up as far as you can in bed or on a chair. Hold the incentive spirometer in an upright position. Breathe out normally. Place the mouthpiece in your mouth and seal your lips tightly around it. Breathe in slowly and as deeply as possible, raising the piston or the ball toward the top of the column. Hold your breath for 3-5 seconds or for as long as possible. Allow the piston or ball to fall to the bottom of the column. Remove the mouthpiece from your mouth and breathe out normally. Rest for a few seconds and repeat Steps 1 through 7 at least 10 times every 1-2 hours when you are awake. Take your time and take a few normal breaths between deep breaths. The spirometer may include an indicator to show your best effort. Use the indicator as a goal to work toward during each repetition. After each set of 10 deep breaths, practice coughing to be sure your lungs are clear. If you have an incision (the cut made at the time of surgery), support your incision when coughing by placing a pillow or rolled up towels firmly against it. Once you are able to get out of bed, walk  around indoors and cough well. You may stop using the incentive spirometer when instructed by your caregiver.  RISKS AND COMPLICATIONS Take your time so you do not get dizzy or light-headed. If you are in pain, you may need to take or ask for pain medication before doing incentive spirometry. It is harder to take a deep breath if you are having pain. AFTER USE Rest and breathe slowly and easily. It can be helpful to keep track of a log of your progress. Your caregiver can provide you with a simple table to help with this. If you are using the spirometer at home, follow these instructions: SEEK MEDICAL CARE IF:  You are having difficultly using the spirometer. You have trouble using the spirometer as often as instructed. Your pain medication is not giving enough relief while using  the spirometer. You develop fever of 100.5 F (38.1 C) or higher. SEEK IMMEDIATE MEDICAL CARE IF:  You cough up bloody sputum that had not been present before. You develop fever of 102 F (38.9 C) or greater. You develop worsening pain at or near the incision site. MAKE SURE YOU:  Understand these instructions. Will watch your condition. Will get help right away if you are not doing well or get worse. Document Released: 07/28/2006 Document Revised: 06/09/2011 Document Reviewed: 09/28/2006 [facility] Patient Information 2014 [place], [place].   ________________________________________________________________________

## 2022-07-17 NOTE — Telephone Encounter (Signed)
I called patient and rescheduled surgery to 08/21/22.

## 2022-07-18 ENCOUNTER — Encounter (HOSPITAL_COMMUNITY)
Admission: RE | Admit: 2022-07-18 | Discharge: 2022-07-18 | Disposition: A | Discharge status: Home / Self Care | Payer: Medicare Other | Source: Ambulatory Visit | Attending: Anesthesiology | Admitting: Anesthesiology

## 2022-07-18 ENCOUNTER — Encounter (HOSPITAL_BASED_OUTPATIENT_CLINIC_OR_DEPARTMENT_OTHER): Payer: Medicare Other | Admitting: Physical Therapy

## 2022-07-18 ENCOUNTER — Other Ambulatory Visit (HOSPITAL_COMMUNITY): Payer: Medicare Other

## 2022-07-18 DIAGNOSIS — E119 Type 2 diabetes mellitus without complications: Secondary | ICD-10-CM

## 2022-07-22 ENCOUNTER — Encounter (HOSPITAL_BASED_OUTPATIENT_CLINIC_OR_DEPARTMENT_OTHER): Payer: Medicare Other | Admitting: Physical Therapy

## 2022-07-23 ENCOUNTER — Telehealth: Payer: Self-pay | Admitting: Orthopaedic Surgery

## 2022-07-23 ENCOUNTER — Other Ambulatory Visit: Payer: Self-pay

## 2022-07-23 DIAGNOSIS — G8929 Other chronic pain: Secondary | ICD-10-CM

## 2022-07-23 DIAGNOSIS — Z96652 Presence of left artificial knee joint: Secondary | ICD-10-CM

## 2022-07-23 NOTE — Telephone Encounter (Signed)
PT order placed in chart. Pt was advised. She also stated she wanted a CPM machine ordered for after her surgery. Is this ok?

## 2022-07-23 NOTE — Telephone Encounter (Signed)
Surgery has been postponed for another month and she is asking for P/T at [facility] until surgery can be done. Please advise.

## 2022-07-23 NOTE — Telephone Encounter (Signed)
Patient asking to schedule her P/T at [facility] after her surgery. She wants Shanda Bumps she is the supervisor for that location. She is advising that she was told to call ASAP they are booked. Please call when done

## 2022-07-24 ENCOUNTER — Ambulatory Visit: Payer: Medicare Other | Admitting: Physical Therapy

## 2022-07-24 ENCOUNTER — Telehealth: Payer: Self-pay | Admitting: Orthopaedic Surgery

## 2022-07-24 NOTE — Telephone Encounter (Signed)
Pt called requesting physical therapy before surgery. Pt phone number is [phone]

## 2022-07-24 NOTE — Telephone Encounter (Signed)
LMOM for patient letting her know I sent a referral to [facility] yesterday

## 2022-07-24 NOTE — Telephone Encounter (Signed)
Patient asking to schedule her P/T at [facility] after her surgery. She wants Shanda Bumps she is the supervisor for that location. She is advising that she was told to call ASAP they are booked. Please call when done    Pt also states [facility] states they can book her right away if referral is sent ASAP. Please call pt at [phone]. Pt had recent surgery

## 2022-07-25 ENCOUNTER — Encounter (HOSPITAL_BASED_OUTPATIENT_CLINIC_OR_DEPARTMENT_OTHER): Payer: Medicare Other | Admitting: Physical Therapy

## 2022-07-25 NOTE — Telephone Encounter (Signed)
I already spoke with the pt. And put PT orders in for [facility] but it looks like she is having a hard time getting in

## 2022-07-25 NOTE — Telephone Encounter (Signed)
Can you send me a sx sheet for this patient. Thx

## 2022-07-25 NOTE — Telephone Encounter (Signed)
Called and advised pt that this will be ordered

## 2022-07-25 NOTE — Telephone Encounter (Signed)
Can you order this please.

## 2022-07-28 NOTE — Telephone Encounter (Signed)
E-mailed to you.  Thanks!

## 2022-07-31 NOTE — Telephone Encounter (Signed)
Order sent in 

## 2022-08-04 ENCOUNTER — Other Ambulatory Visit: Payer: Self-pay

## 2022-08-04 ENCOUNTER — Telehealth: Payer: Self-pay | Admitting: Orthopaedic Surgery

## 2022-08-04 ENCOUNTER — Encounter (HOSPITAL_BASED_OUTPATIENT_CLINIC_OR_DEPARTMENT_OTHER): Payer: Self-pay | Admitting: Physical Therapy

## 2022-08-04 ENCOUNTER — Ambulatory Visit (HOSPITAL_BASED_OUTPATIENT_CLINIC_OR_DEPARTMENT_OTHER): Payer: Medicare Other | Attending: Orthopaedic Surgery | Admitting: Physical Therapy

## 2022-08-04 DIAGNOSIS — M25562 Pain in left knee: Secondary | ICD-10-CM | POA: Diagnosis present

## 2022-08-04 DIAGNOSIS — R262 Difficulty in walking, not elsewhere classified: Secondary | ICD-10-CM | POA: Diagnosis present

## 2022-08-04 DIAGNOSIS — G8929 Other chronic pain: Secondary | ICD-10-CM | POA: Insufficient documentation

## 2022-08-04 DIAGNOSIS — M6281 Muscle weakness (generalized): Secondary | ICD-10-CM | POA: Diagnosis present

## 2022-08-04 NOTE — Therapy (Signed)
OUTPATIENT PHYSICAL THERAPY LOWER EXTREMITY TREATMENT Patient Name: Krystal Lang MRN: [id number] DOB:[birth year], 67 y.o., female Today's Date: 08/04/2022  END OF SESSION:  PT End of Session - 08/04/22 0815     Visit Number 1    Number of Visits 4    Date for PT Re-Evaluation 11/02/22    Authorization Type MEDICARE PART A AND B    Progress Note Due on Visit 20    PT Start Time 0805   arrives late   PT Stop Time 0835    PT Time Calculation (min) 30 min    Activity Tolerance Patient tolerated treatment well    Behavior During Therapy [facility] for tasks assessed/performed               Past Medical History:  Diagnosis Date   Anxiety    Arthritis    Cataract    Mixed form OU   Diabetes mellitus    Fibroid uterus 2001   H/O fatigue 1998   Headache(784.0)    Hirsutism 1998   Idiopathic   History of chicken pox    Hypothyroidism    Menses, irregular 1998   Past Surgical History:  Procedure Laterality Date   COLONOSCOPY  02/2021   DILATION AND CURETTAGE OF UTERUS  03/31/2000   In [place]   KNEE ARTHROPLASTY Right    Replaced in [place], [place]   KNEE ARTHROSCOPY Right 2008   TONSILLECTOMY     As a child   Patient Active Problem List   Diagnosis Date Noted   Unilateral primary osteoarthritis, left knee 02/27/2022   Bilateral primary osteoarthritis of knee 05/11/2017   Dyspareunia, female 11/13/2011   Type 2 diabetes mellitus (HCC) 11/13/2011   Alopecia 11/13/2011   History of chicken pox    Headache(784.0)    Hypothyroidism    Menses, irregular    Hirsutism    H/O fatigue    Fibroid uterus     PCP: Orpah Cobb, MD   REFERRING PROVIDER: Kathryne Hitch, MD  REFERRING DIAG:  [id number] (ICD-10-CM) - Status post left knee replacement     THERAPY DIAG:  Chronic pain of left knee  Muscle weakness (generalized)  Difficulty in walking, not elsewhere classified  Rationale for Evaluation and Treatment: Rehabilitation  ONSET DATE:  02/28/2022  SUBJECTIVE:   SUBJECTIVE STATEMENT: Pt states she is here for L knee pre-hab. Pt's L TKA was delayed and pushed back due to her tooth infection/oral issues. Pt states the L knee is much worse and hurting all the time now. She states it now hurts in the back of the knee and front of the knee where it didn't use to before. Pt is now more limited with the L knee in terms of gait and exercise. Pt has difficulty with LE dressing. Pt able to walk about 30 mins or so and has been more fatigued due the knee pain. Pt unable to bend knee coming downstairs due to the pain.   PERTINENT HISTORY: R TKA PAIN:  Are you having pain? Yes: NPRS scale: 6/10 of the   Pain location:front and side of knee Pain description: sore  Aggravating factors: exercise, bending, coming downstairs Relieving factors: medication, rest, ice  PRECAUTIONS: None  WEIGHT BEARING RESTRICTIONS: No  FALLS:  Has patient fallen in last 6 months? No  LIVING ENVIRONMENT: Lives with: lives with their family Lives in: House/apartment Stairs: Yes: Internal: 12 steps; can reach both and External: 5 steps; on left going up Has following equipment at home:  None  OCCUPATION: retired  PLOF: Independent, Independent with basic ADLs, Independent with household mobility without device, Independent with community mobility without device, Independent with homemaking with ambulation, Independent with gait, and Independent with transfers  PATIENT GOALS: L knee pre-hab prior to TKA     OBJECTIVE:   DIAGNOSTIC FINDINGS: IMPRESSION: 1. Radial tear involving the posterior horn of the medial meniscus at the meniscal root with detachment and medial protrusion of the meniscus. 2. Horizontal cleavage-type tear in the midbody region of the medial meniscus. 3. Severe mucoid degeneration of the ACL. 4. Advanced tricompartmental degenerative changes as described above. 5. Large joint effusion and moderate-sized leaking Baker's  cyst.  PATIENT SURVEYS:  Lower Extremity Functional Score: 42 / 80 = 52.5 %  COGNITION: Overall cognitive status: Within functional limits for tasks assessed     SENSATION: WFL  POSTURE: No Significant postural limitations  LOWER EXTREMITY ROM:  Active ROM Right eval Left 5/6  Hip flexion    Hip extension    Hip abduction    Hip adduction    Hip internal rotation    Hip external rotation    Knee flexion 99 97  Knee extension -1 -10  Ankle dorsiflexion    Ankle plantarflexion    Ankle inversion    Ankle eversion     (Blank rows = not tested)  LOWER EXTREMITY MMT:  L Knee 5/6: 4/5 flexion and ext with pain  R knee 5/6: 4+/5 flextion and ext  FUNCTIONAL TESTS:  5/6 5XSTS: 11.1s   GAIT: Distance walked: 51ft  Assistive device utilized: None Level of assistance: Complete Independence Comments: antalgic, L knee varus  Stairs: able to perform reciprocal pattern with UE assist with increase in pain the L LE eccentric lowering of R; pain with descent   TODAY'S TREATMENT:                                                                                                                              DATE:   Exercises - Sitting Knee Extension with Resistance  - 2 x daily - 7 x weekly - 3 sets - 10 reps - Supine Active Straight Leg Raise  - 2 x daily - 7 x weekly - 3 sets - 10 reps - Standing Knee Flexion Stretch on Step  - 2 x daily - 7 x weekly - 3 sets - 10 reps - 10 hold - Sit to Stand  - 2 x daily - 7 x weekly - 3 sets - 10 reps - Supine Hamstring Stretch  - 2 x daily - 7 x weekly - 1 sets - 10 reps - 10 hold  PATIENT EDUCATION:  Education details: focus of prehab anatomy, exercise progression, DOMS expectations, acceptable pain threshold, HEP, POC Person educated: Patient Education method: Explanation, Demonstration, Tactile cues, Verbal cues Education comprehension: verbalized understanding, returned demonstration, verbal cues required, and tactile cues  required  HOME EXERCISE PROGRAM: Access Code: [email or web address] URL: [email or web address] Date:  08/04/2022 Prepared by: Zebedee Iba  ASSESSMENT:  CLINICAL IMPRESSION: Patient is a 67 y.o. female who was seen today for physical therapy evaluation and treatment for c/c of L knee pain. Pt's s/s appear consistent with history of L knee OA. Pt's pain is moderately sensitive and irritable with movement. Pt is in therapy at this time for 1-2 visits of prehabilitation exercise prior to L TKA in order to maximize ROM and L knee strength. Plan to update/progress HEP at next. Pt would benefit from continued skilled therapy in order to reach goals and maximize functional L LE strength and ROM prior to TKA.    OBJECTIVE IMPAIRMENTS: difficulty walking, decreased ROM, decreased strength, increased fascial restrictions, increased muscle spasms, impaired flexibility, and pain.   ACTIVITY LIMITATIONS: carrying, lifting, bending, sitting, standing, squatting, sleeping, stairs, transfers, and dressing  PARTICIPATION LIMITATIONS: meal prep, cleaning, laundry, driving, shopping, community activity, and yard work  PERSONAL FACTORS: Age, Fitness, and Past/current experiences are also affecting patient's functional outcome.   REHAB POTENTIAL: Good  CLINICAL DECISION MAKING: Stable/uncomplicated  EVALUATION COMPLEXITY: Low   GOALS: Goals reviewed with patient? Yes  SHORT TERM GOALS: 09/15/2022  1.  Pt will have an at least 9 pt improvement in LEFS measure in order to demonstrate MCID improvement in daily function.   Goal status: INITIAL  2.  Pt will be able to demonstrate 5 deg improvement in L knee flexion and ext in order to demonstrate functional improvement in LE ROM prior to TKA.  Goal status: INITIAL  3.  Pt will be able to demonstrate reciprocal stair stepping pattern with controlled descent on L in order to demonstrate functional improvement in LE function for daily mobility.   Goal  status: INITIAL  PLAN:  PT FREQUENCY: 1x/week  PT DURATION: 6 wks ( only needs 2-3 wks for HEP development) to cover any delay in scheduling  PLANNED INTERVENTIONS: Therapeutic exercises, Therapeutic activity, Neuromuscular re-education, Balance training, Gait training, Patient/Family education, Self Care, Joint mobilization, Stair training, Aquatic Therapy, Dry Needling, Electrical stimulation, Cryotherapy, Moist heat, Splintting, Taping, Vasopneumatic device, Ultrasound, Ionotophoresis 4mg /ml Dexamethasone, Manual therapy, and Re-evaluation  PLAN FOR NEXT SESSION: Progress land based HEP for L knee ROM and strength   Zebedee Iba PT, DPT 08/04/22 12:54 PM

## 2022-08-04 NOTE — Telephone Encounter (Signed)
Patient called asked if Dr. Magnus Ivan would call her concerning questions she has about the dental work she had 07/16/2022. Patient said she has a couple of questions prior to her surgery 08/21/2022.  Patient said she had some infection in her gum but, the tooth was not pulled. Patient said she was put on an antibiotic for a week. Patient said she is concerned that the infection may come back. The number to contact patient is [phone]

## 2022-08-07 ENCOUNTER — Encounter: Payer: Medicare Other | Admitting: Orthopaedic Surgery

## 2022-08-14 NOTE — Patient Instructions (Addendum)
SURGICAL WAITING ROOM VISITATION  Patients having surgery or a procedure may have no more than 2 support people in the waiting area - these visitors may rotate.    Children under the age of 70 must have an adult with them who is not the patient.  Due to an increase in RSV and influenza rates and associated hospitalizations, children ages 82 and under may not visit patients in [facility] hospitals.  If the patient needs to stay at the hospital during part of their recovery, the visitor guidelines for inpatient rooms apply. Pre-op nurse will coordinate an appropriate time for 1 support person to accompany patient in pre-op.  This support person may not rotate.    Please refer to the [facility] website for the visitor guidelines for Inpatients (after your surgery is over and you are in a regular room).    Your procedure is scheduled on: May 23,2024   Report to [facility] Main Entrance    Report to admitting at 7 AM   Call this number if you have problems the morning of surgery [phone]   Do not eat food :After Midnight.   After Midnight you may have the following liquids until 6:30 AM DAY OF SURGERY  Water Non-Citrus Juices (without pulp, NO RED-Apple, White grape, White cranberry) Black Coffee (NO MILK/CREAM OR CREAMERS, sugar ok)  Clear Tea (NO MILK/CREAM OR CREAMERS, sugar ok) regular and decaf                             Plain Jell-O (NO RED)                                           Fruit ices (not with fruit pulp, NO RED)                                     Popsicles (NO RED)                                                               Sports drinks like Gatorade (NO RED)                The day of surgery:  Drink ONE (1) Pre-Surgery G2 at 6:30 AM the morning of surgery. Drink in one sitting. Do not sip.  This drink was given to you during your hospital  pre-op appointment visit. Nothing else to drink after completing the  Pre-Surgery Clear Ensure or G2.           If you have questions, please contact your surgeon's office.   FOLLOW BOWEL PREP AND ANY ADDITIONAL PRE OP INSTRUCTIONS YOU RECEIVED FROM YOUR SURGEON'S OFFICE!!!     Oral Hygiene is also important to reduce your risk of infection.                                    Remember - BRUSH YOUR TEETH THE MORNING OF SURGERY WITH YOUR REGULAR TOOTHPASTE  DENTURES  WILL BE REMOVED PRIOR TO SURGERY PLEASE DO NOT APPLY "Poly grip" OR ADHESIVES!!!   Take these medicines the morning of surgery with A SIP OF WATER: Atorvastatin, Levothyroxine  DO NOT TAKE ANY ORAL DIABETIC MEDICATIONS DAY OF YOUR SURGERY   How to Manage Your Diabetes Before and After Surgery  Why is it important to control my blood sugar before and after surgery? Improving blood sugar levels before and after surgery helps healing and can limit problems. A way of improving blood sugar control is eating a healthy diet by:  Eating less sugar and carbohydrates  Increasing activity/exercise  Talking with your doctor about reaching your blood sugar goals High blood sugars (greater than 180 mg/dL) can raise your risk of infections and slow your recovery, so you will need to focus on controlling your diabetes during the weeks before surgery. Make sure that the doctor who takes care of your diabetes knows about your planned surgery including the date and location.  How do I manage my blood sugar before surgery? Check your blood sugar at least 4 times a day, starting 2 days before surgery, to make sure that the level is not too high or low. Check your blood sugar the morning of your surgery when you wake up and every 2 hours until you get to the Short Stay unit. If your blood sugar is less than 70 mg/dL, you will need to treat for low blood sugar: Do not take insulin. Treat a low blood sugar (less than 70 mg/dL) with  cup of clear juice (cranberry or apple), 4 glucose tablets, OR glucose gel. Recheck blood sugar in 15 minutes after  treatment (to make sure it is greater than 70 mg/dL). If your blood sugar is not greater than 70 mg/dL on recheck, call [phone] for further instructions. Report your blood sugar to the short stay nurse when you get to Short Stay.  If you are admitted to the hospital after surgery: Your blood sugar will be checked by the staff and you will probably be given insulin after surgery (instead of oral diabetes medicines) to make sure you have good blood sugar levels. The goal for blood sugar control after surgery is 80-180 mg/dL.   WHAT DO I DO ABOUT MY DIABETES MEDICATION?  Do not take oral diabetes medicines (pills) the morning of surgery.   HOLD JARDIANCE FOR 72 HOURS PRIOR TO SURGERY. Last dose 08/17/22  THE DAY BEFORE SURGERY, take Morning doses of Glimepiride and Janumet. Do not take evening doses.      THE MORNING OF SURGERY, DO NOT TAKE ANY DIABETIC MEDICATION                               You may not have any metal on your body including hair pins, jewelry, and body piercing             Do not wear make-up, lotions, powders, perfumes or deodorant  Do not wear nail polish including gel and S&S, artificial/acrylic nails, or any other type of covering on natural nails including finger and toenails. If you have artificial nails, gel coating, etc. that needs to be removed by a nail salon please have this removed prior to surgery or surgery may need to be canceled/ delayed if the surgeon/ anesthesia feels like they are unable to be safely monitored.   Do not shave  48 hours prior to surgery.    Do not bring valuables  to the hospital. [facility] IS NOT             RESPONSIBLE   FOR VALUABLES.   Contacts, glasses, dentures or bridgework may not be worn into surgery.   Bring small overnight bag day of surgery.   DO NOT BRING YOUR HOME MEDICATIONS TO THE HOSPITAL. PHARMACY WILL DISPENSE MEDICATIONS LISTED ON YOUR MEDICATION LIST TO YOU DURING YOUR ADMISSION IN THE  HOSPITAL!    Patients discharged on the day of surgery will not be allowed to drive home.  Someone NEEDS to stay with you for the first 24 hours after anesthesia.   Special Instructions: Bring a copy of your healthcare power of attorney and living will documents the day of surgery if you haven't scanned them before.              Please read over the following fact sheets you were given: IF YOU HAVE QUESTIONS ABOUT YOUR PRE-OP INSTRUCTIONS PLEASE CALL [phone]    If you received a COVID test during your pre-op visit  it is requested that you wear a mask when out in public, stay away from anyone that may not be feeling well and notify your surgeon if you develop symptoms. If you test positive for Covid or have been in contact with anyone that has tested positive in the last 10 days please notify you surgeon.     Pre-operative 5 CHG Bath Instructions   You can play a key role in reducing the risk of infection after surgery. Your skin needs to be as free of germs as possible. You can reduce the number of germs on your skin by washing with CHG (chlorhexidine gluconate) soap before surgery. CHG is an antiseptic soap that kills germs and continues to kill germs even after washing.   DO NOT use if you have an allergy to chlorhexidine/CHG or antibacterial soaps. If your skin becomes reddened or irritated, stop using the CHG and notify one of our RNs at [phone].   Please shower with the CHG soap starting 4 days before surgery using the following schedule:     Please keep in mind the following:  DO NOT shave, including legs and underarms, starting the day of your first shower.   You may shave your face at any point before/day of surgery.  Place clean sheets on your bed the day you start using CHG soap. Use a clean washcloth (not used since being washed) for each shower. DO NOT sleep with pets once you start using the CHG.   CHG Shower Instructions:  If you choose to wash your  hair and private area, wash first with your normal shampoo/soap.  After you use shampoo/soap, rinse your hair and body thoroughly to remove shampoo/soap residue.  Turn the water OFF and apply about 3 tablespoons (45 ml) of CHG soap to a CLEAN washcloth.  Apply CHG soap ONLY FROM YOUR NECK DOWN TO YOUR TOES (washing for 3-5 minutes)  DO NOT use CHG soap on face, private areas, open wounds, or sores.  Pay special attention to the area where your surgery is being performed.  If you are having back surgery, having someone wash your back for you may be helpful. Wait 2 minutes after CHG soap is applied, then you may rinse off the CHG soap.  Pat dry with a clean towel  Put on clean clothes/pajamas   If you choose to wear lotion, please use ONLY the CHG-compatible lotions on the back of this paper.  Additional instructions for the day of surgery: DO NOT APPLY any lotions, deodorants, cologne, or perfumes.   Put on clean/comfortable clothes.  Brush your teeth.  Ask your nurse before applying any prescription medications to the skin.      CHG Compatible Lotions   Aveeno Moisturizing lotion  Cetaphil Moisturizing Cream  Cetaphil Moisturizing Lotion  Clairol Herbal Essence Moisturizing Lotion, Dry Skin  Clairol Herbal Essence Moisturizing Lotion, Extra Dry Skin  Clairol Herbal Essence Moisturizing Lotion, Normal Skin  Curel Age Defying Therapeutic Moisturizing Lotion with Alpha Hydroxy  Curel Extreme Care Body Lotion  Curel Soothing Hands Moisturizing Hand Lotion  Curel Therapeutic Moisturizing Cream, Fragrance-Free  Curel Therapeutic Moisturizing Lotion, Fragrance-Free  Curel Therapeutic Moisturizing Lotion, Original Formula  Eucerin Daily Replenishing Lotion  Eucerin Dry Skin Therapy Plus Alpha Hydroxy Crme  Eucerin Dry Skin Therapy Plus Alpha Hydroxy Lotion  Eucerin Original Crme  Eucerin Original Lotion  Eucerin Plus Crme Eucerin Plus Lotion  Eucerin TriLipid Replenishing  Lotion  Keri Anti-Bacterial Hand Lotion  Keri Deep Conditioning Original Lotion Dry Skin Formula Softly Scented  Keri Deep Conditioning Original Lotion, Fragrance Free Sensitive Skin Formula  Keri Lotion Fast Absorbing Fragrance Free Sensitive Skin Formula  Keri Lotion Fast Absorbing Softly Scented Dry Skin Formula  Keri Original Lotion  Keri Skin Renewal Lotion Keri Silky Smooth Lotion  Keri Silky Smooth Sensitive Skin Lotion  Nivea Body Creamy Conditioning Oil  Nivea Body Extra Enriched Teacher, adult education Moisturizing Lotion Nivea Crme  Nivea Skin Firming Lotion  NutraDerm 30 Skin Lotion  NutraDerm Skin Lotion  NutraDerm Therapeutic Skin Cream  NutraDerm Therapeutic Skin Lotion  ProShield Protective Hand Cream  Provon moisturizing lotion   WHAT IS A BLOOD TRANSFUSION? Blood Transfusion Information  A transfusion is the replacement of blood or some of its parts. Blood is made up of multiple cells which provide different functions. Red blood cells carry oxygen and are used for blood loss replacement. White blood cells fight against infection. Platelets control bleeding. Plasma helps clot blood. Other blood products are available for specialized needs, such as hemophilia or other clotting disorders. BEFORE THE TRANSFUSION  Who gives blood for transfusions?  Healthy volunteers who are fully evaluated to make sure their blood is safe. This is blood bank blood. Transfusion therapy is the safest it has ever been in the practice of medicine. Before blood is taken from a donor, a complete history is taken to make sure that person has no history of diseases nor engages in risky social behavior (examples are intravenous drug use or sexual activity with multiple partners). The donor's travel history is screened to minimize risk of transmitting infections, such as malaria. The donated blood is tested for signs of infectious diseases, such as HIV and hepatitis.  The blood is then tested to be sure it is compatible with you in order to minimize the chance of a transfusion reaction. If you or a relative donates blood, this is often done in anticipation of surgery and is not appropriate for emergency situations. It takes many days to process the donated blood. RISKS AND COMPLICATIONS Although transfusion therapy is very safe and saves many lives, the main dangers of transfusion include:  Getting an infectious disease. Developing a transfusion reaction. This is an allergic reaction to something in the blood you were given. Every precaution is taken to prevent this. The decision to have a blood transfusion has been considered carefully by your caregiver before blood is  given. Blood is not given unless the benefits outweigh the risks. AFTER THE TRANSFUSION Right after receiving a blood transfusion, you will usually feel much better and more energetic. This is especially true if your red blood cells have gotten low (anemic). The transfusion raises the level of the red blood cells which carry oxygen, and this usually causes an energy increase. The nurse administering the transfusion will monitor you carefully for complications. HOME CARE INSTRUCTIONS  No special instructions are needed after a transfusion. You may find your energy is better. Speak with your caregiver about any limitations on activity for underlying diseases you may have. SEEK MEDICAL CARE IF:  Your condition is not improving after your transfusion. You develop redness or irritation at the intravenous (IV) site. SEEK IMMEDIATE MEDICAL CARE IF:  Any of the following symptoms occur over the next 12 hours: Shaking chills. You have a temperature by mouth above 102 F (38.9 C), not controlled by medicine. Chest, back, or muscle pain. People around you feel you are not acting correctly or are confused. Shortness of breath or difficulty breathing. Dizziness and fainting. You get a rash or develop  hives. You have a decrease in urine output. Your urine turns a dark color or changes to pink, red, or brown. Any of the following symptoms occur over the next 10 days: You have a temperature by mouth above 102 F (38.9 C), not controlled by medicine. Shortness of breath. Weakness after normal activity. The white part of the eye turns yellow (jaundice). You have a decrease in the amount of urine or are urinating less often. Your urine turns a dark color or changes to pink, red, or brown. Document Released: 03/14/2000 Document Revised: 06/09/2011 Document Reviewed: 11/01/2007 [facility] Patient Information 2014 [place], [place].

## 2022-08-14 NOTE — Progress Notes (Addendum)
COVID Vaccine received:  []  No [x]  Yes Date of any COVID positive Test in last 90 days: no  PCP - Orpah Cobb MD Dr. Clifton Custard Cardiologist - Orpah Cobb MD  Chest x-ray - 01/31/13 EPIC EKG -  06/12/22 EPIC Stress Test - 10 years ago per pt ECHO - 10 years ago per pt Cardiac Cath - n/a  Bowel Prep - [x]  No  []   Yes ______  Pacemaker / ICD device [x]  No []  Yes   Spinal Cord Stimulator:[x]  No []  Yes       History of Sleep Apnea? [x]  No []  Yes   CPAP used?- [x]  No []  Yes    Does the patient monitor blood sugar?          []  No [x]  Yes  []  N/A  Patient has: []  NO Hx DM   []  Pre-DM                 []  DM1  [x]   DM2 Does patient have a [employer] or [employer]? []  No [x]  Yes   Fasting Blood Sugar Ranges- 70-100 Checks Blood Sugar freestyle libre  GLP1 agonist / usual dose - no GLP1 instructions:  SGLT-2 inhibitors / usual dose - no SGLT-2 instructions:   Blood Thinner / Instructions: n/a Aspirin Instructions:  Comments:   Activity level: Patient is able to climb a flight of stairs without difficulty; [x]  No CP  [x]  No SOB, Patient can perform ADLs without assistance.   Anesthesia review:   Patient denies shortness of breath, fever, cough and chest pain at PAT appointment.  Patient verbalized understanding and agreement to the Pre-Surgical Instructions that were given to them at this PAT appointment. Patient was also educated of the need to review these PAT instructions again prior to his/her surgery.I reviewed the appropriate phone numbers to call if they have any and questions or concerns.

## 2022-08-14 NOTE — Progress Notes (Addendum)
COVID Vaccine received:  [x]  No []  Yes Date of any COVID positive Test in last 90 days:   PCP - Orpah Cobb MD Cardiologist - Orpah Cobb MD   Chest x-ray - 01/31/13 EPIC EKG -  06/12/22 EPIC Stress Test -  ECHO -  Cardiac Cath -    Bowel Prep - []  No  []   Yes ______   Pacemaker / ICD device []  No []  Yes   Spinal Cord Stimulator:[]  No []  Yes       History of Sleep Apnea? []  No []  Yes   CPAP used?- []  No []  Yes     Does the patient monitor blood sugar?          []  No [x]  Yes  []  N/A   Patient has: []  NO Hx DM   []  Pre-DM                 []  DM1  [x]   DM2 Does patient have a [employer] or [employer]? []  No [x]  Yes   Fasting Blood Sugar Ranges-  Checks Blood Sugar _____ times a day   GLP1 agonist / usual dose - no GLP1 instructions:  SGLT-2 inhibitors / usual dose - no SGLT-2 instructions:    Blood Thinner / Instructions: Aspirin Instructions:   Comments:    Activity level: Patient is able / unable to climb a flight of stairs without difficulty; []  No CP  []  No SOB, but would have ___   Patient can / can not perform ADLs without assistance.    Anesthesia review:    Patient denies shortness of breath, fever, cough and chest pain at PAT appointment.   Patient verbalized understanding and agreement to the Pre-Surgical Instructions that were given to them at this PAT appointment. Patient was also educated of the need to review these PAT instructions again prior to his/her surgery.I reviewed the appropriate phone numbers to call if they have any and questions or concerns

## 2022-08-15 ENCOUNTER — Encounter (HOSPITAL_COMMUNITY)
Admission: RE | Admit: 2022-08-15 | Discharge: 2022-08-15 | Disposition: A | Discharge status: Home / Self Care | Payer: Medicare Other | Source: Ambulatory Visit | Attending: Orthopaedic Surgery | Admitting: Orthopaedic Surgery

## 2022-08-15 ENCOUNTER — Other Ambulatory Visit: Payer: Self-pay

## 2022-08-15 ENCOUNTER — Encounter (HOSPITAL_COMMUNITY): Payer: Self-pay

## 2022-08-15 VITALS — BP 146/59 | HR 74 | Temp 97.8°F | Resp 16 | Ht 63.0 in | Wt 162.0 lb

## 2022-08-15 DIAGNOSIS — Z01812 Encounter for preprocedural laboratory examination: Secondary | ICD-10-CM | POA: Insufficient documentation

## 2022-08-15 DIAGNOSIS — Z794 Long term (current) use of insulin: Secondary | ICD-10-CM | POA: Diagnosis not present

## 2022-08-15 DIAGNOSIS — E119 Type 2 diabetes mellitus without complications: Secondary | ICD-10-CM | POA: Insufficient documentation

## 2022-08-15 DIAGNOSIS — Z01818 Encounter for other preprocedural examination: Secondary | ICD-10-CM

## 2022-08-15 HISTORY — DX: Inflammatory liver disease, unspecified: K75.9

## 2022-08-15 HISTORY — DX: Other complications of anesthesia, initial encounter: T88.59XA

## 2022-08-15 LAB — COMPREHENSIVE METABOLIC PANEL
ALT: 23 U/L (ref 0–44)
AST: 20 U/L (ref 15–41)
Albumin: 4.4 g/dL (ref 3.5–5.0)
Alkaline Phosphatase: 56 U/L (ref 38–126)
Anion gap: 10 (ref 5–15)
BUN: 18 mg/dL (ref 8–23)
CO2: 23 mmol/L (ref 22–32)
Calcium: 10.2 mg/dL (ref 8.9–10.3)
Chloride: 105 mmol/L (ref 98–111)
Creatinine, Ser: 1.12 mg/dL — ABNORMAL HIGH (ref 0.44–1.00)
GFR, Estimated: 54 mL/min — ABNORMAL LOW (ref >60)
Glucose, Bld: 97 mg/dL (ref 70–99)
Potassium: 4.8 mmol/L (ref 3.5–5.1)
Sodium: 138 mmol/L (ref 135–145)
Total Bilirubin: 0.9 mg/dL (ref 0.3–1.2)
Total Protein: 7.6 g/dL (ref 6.5–8.1)

## 2022-08-15 LAB — SURGICAL PCR SCREEN
MRSA, PCR: NEGATIVE
Staphylococcus aureus: NEGATIVE

## 2022-08-15 LAB — TYPE AND SCREEN

## 2022-08-15 LAB — CBC
HCT: 42.7 % (ref 36.0–46.0)
Hemoglobin: 12.9 g/dL (ref 12.0–15.0)
MCH: 28.2 pg (ref 26.0–34.0)
MCHC: 30.2 g/dL (ref 30.0–36.0)
MCV: 93.4 fL (ref 80.0–100.0)
Platelets: 283 10*3/uL (ref 150–400)
RBC: 4.57 MIL/uL (ref 3.87–5.11)
RDW: 14.2 % (ref 11.5–15.5)
WBC: 6.3 10*3/uL (ref 4.0–10.5)
nRBC: 0 % (ref 0.0–0.2)

## 2022-08-15 LAB — GLUCOSE, CAPILLARY: Glucose-Capillary: 100 mg/dL — ABNORMAL HIGH (ref 70–99)

## 2022-08-15 LAB — HEMOGLOBIN A1C
Hgb A1c MFr Bld: 5.3 % (ref 4.8–5.6)
Mean Plasma Glucose: 105.41 mg/dL

## 2022-08-18 ENCOUNTER — Ambulatory Visit (HOSPITAL_BASED_OUTPATIENT_CLINIC_OR_DEPARTMENT_OTHER): Payer: Medicare Other | Admitting: Physical Therapy

## 2022-08-18 ENCOUNTER — Encounter (HOSPITAL_BASED_OUTPATIENT_CLINIC_OR_DEPARTMENT_OTHER): Payer: Self-pay | Admitting: Physical Therapy

## 2022-08-18 ENCOUNTER — Telehealth: Payer: Self-pay | Admitting: Orthopaedic Surgery

## 2022-08-18 DIAGNOSIS — M6281 Muscle weakness (generalized): Secondary | ICD-10-CM

## 2022-08-18 DIAGNOSIS — R262 Difficulty in walking, not elsewhere classified: Secondary | ICD-10-CM

## 2022-08-18 DIAGNOSIS — M25562 Pain in left knee: Secondary | ICD-10-CM | POA: Diagnosis not present

## 2022-08-18 DIAGNOSIS — G8929 Other chronic pain: Secondary | ICD-10-CM

## 2022-08-18 NOTE — Telephone Encounter (Signed)
Spoke with [facility], they will call her

## 2022-08-18 NOTE — Telephone Encounter (Signed)
Patient advising that no one has set up her P/T for after surgery. Please advise

## 2022-08-18 NOTE — Therapy (Signed)
OUTPATIENT PHYSICAL THERAPY LOWER EXTREMITY TREATMENT Patient Name: Krystal Lang MRN: [id number] DOB:08-Aug-1955, 67 y.o., female Today's Date: 08/18/2022  END OF SESSION:  PT End of Session - 08/18/22 1431     Visit Number 2    Number of Visits 4    Date for PT Re-Evaluation 11/02/22    Authorization Type MEDICARE PART A AND B    Progress Note Due on Visit 20    PT Start Time 1430    PT Stop Time 1511    PT Time Calculation (min) 41 min    Activity Tolerance Patient tolerated treatment well    Behavior During Therapy WFL for tasks assessed/performed                Past Medical History:  Diagnosis Date   Anxiety    Arthritis    Cataract    Mixed form OU   Complication of anesthesia    after knee arthroscopy pateint difficult to wake up   Diabetes mellitus    Fibroid uterus 2001   H/O fatigue 1998   Headache(784.0)    denies   Hepatitis    A, 30 years ago   Hirsutism 1998   Idiopathic   History of chicken pox    Hypothyroidism    Menses, irregular 1998   Past Surgical History:  Procedure Laterality Date   COLONOSCOPY  02/2021   DILATION AND CURETTAGE OF UTERUS  03/31/2000   In [place]   KNEE ARTHROPLASTY Right    Replaced in [place], [place]   KNEE ARTHROSCOPY Right 2008   TONSILLECTOMY     As a child   Patient Active Problem List   Diagnosis Date Noted   Unilateral primary osteoarthritis, left knee 02/27/2022   Bilateral primary osteoarthritis of knee 05/11/2017   Dyspareunia, female 11/13/2011   Type 2 diabetes mellitus (HCC) 11/13/2011   Alopecia 11/13/2011   History of chicken pox    Headache(784.0)    Hypothyroidism    Menses, irregular    Hirsutism    H/O fatigue    Fibroid uterus     PCP: Orpah Cobb, MD   REFERRING PROVIDER: Kathryne Hitch, MD  REFERRING DIAG:  [id number] (ICD-10-CM) - Status post left knee replacement     THERAPY DIAG:  Difficulty in walking, not elsewhere classified  Chronic pain of left knee  Muscle  weakness (generalized)  Rationale for Evaluation and Treatment: Rehabilitation  ONSET DATE: 02/28/2022  SUBJECTIVE:   SUBJECTIVE STATEMENT: It is worse than March.   PERTINENT HISTORY: R TKA PAIN:  Are you having pain? Yes: NPRS scale: 6/10 of the   Pain location:front and side of knee Pain description: sore  Aggravating factors: exercise, bending, coming downstairs Relieving factors: medication, rest, ice  PRECAUTIONS: None  WEIGHT BEARING RESTRICTIONS: No  FALLS:  Has patient fallen in last 6 months? No  LIVING ENVIRONMENT: Lives with: lives with their family Lives in: House/apartment Stairs: Yes: Internal: 12 steps; can reach both and External: 5 steps; on left going up Has following equipment at home: None  OCCUPATION: retired  PLOF: Independent, Independent with basic ADLs, Independent with household mobility without device, Independent with community mobility without device, Independent with homemaking with ambulation, Independent with gait, and Independent with transfers  PATIENT GOALS: L knee pre-hab prior to TKA     OBJECTIVE:   DIAGNOSTIC FINDINGS: IMPRESSION: 1. Radial tear involving the posterior horn of the medial meniscus at the meniscal root with detachment and medial protrusion of the meniscus.  2. Horizontal cleavage-type tear in the midbody region of the medial meniscus. 3. Severe mucoid degeneration of the ACL. 4. Advanced tricompartmental degenerative changes as described above. 5. Large joint effusion and moderate-sized leaking Baker's cyst.  PATIENT SURVEYS:  Lower Extremity Functional Score: 42 / 80 = 52.5 %  COGNITION: Overall cognitive status: Within functional limits for tasks assessed     SENSATION: WFL  POSTURE: No Significant postural limitations  LOWER EXTREMITY ROM:  Active ROM Right eval Left 5/6  Hip flexion    Hip extension    Hip abduction    Hip adduction    Hip internal rotation    Hip external rotation     Knee flexion 99 97  Knee extension -1 -10  Ankle dorsiflexion    Ankle plantarflexion    Ankle inversion    Ankle eversion     (Blank rows = not tested)  LOWER EXTREMITY MMT:  L Knee 5/6: 4/5 flexion and ext with pain  R knee 5/6: 4+/5 flextion and ext  FUNCTIONAL TESTS:  5/6 5XSTS: 11.1s   GAIT: Distance walked: 69ft  Assistive device utilized: None Level of assistance: Complete Independence Comments: antalgic, L knee varus  Stairs: able to perform reciprocal pattern with UE assist with increase in pain the L LE eccentric lowering of R; pain with descent   TODAY'S TREATMENT:                                                                                                                                Treatment                            5/20:  HEP review Discussion regarding post op care and PT progression   PATIENT EDUCATION:  Education details: focus of prehab anatomy, exercise progression, DOMS expectations, acceptable pain threshold, HEP, POC Person educated: Patient Education method: Explanation, Demonstration, Tactile cues, Verbal cues Education comprehension: verbalized understanding, returned demonstration, verbal cues required, and tactile cues required  HOME EXERCISE PROGRAM: Access Code: [email or web address] URL: [email or web address]   ASSESSMENT:  CLINICAL IMPRESSION: Patient reports she continues to have significant posterior knee pain and has been working on self massage.  We reviewed exercises for her to continue doing for the next 3 days as well as the most important ones to do as soon as she is finished with surgery.  Patient reports she would like to do home health therapy but is scheduled to begin here right after so she will reach out to her surgeon to see if she has been referred for home health therapy.  Encouraged her to reach out to me with any further questions in the meantime.  Will perform a reevaluation upon her return.  OBJECTIVE  IMPAIRMENTS: difficulty walking, decreased ROM, decreased strength, increased fascial restrictions, increased muscle spasms, impaired flexibility, and pain.   ACTIVITY LIMITATIONS: carrying, lifting, bending, sitting, standing, squatting, sleeping, stairs, transfers,  and dressing  PARTICIPATION LIMITATIONS: meal prep, cleaning, laundry, driving, shopping, community activity, and yard work  PERSONAL FACTORS: Age, Fitness, and Past/current experiences are also affecting patient's functional outcome.   REHAB POTENTIAL: Good  CLINICAL DECISION MAKING: Stable/uncomplicated  EVALUATION COMPLEXITY: Low   GOALS: Goals reviewed with patient? Yes  SHORT TERM GOALS: 09/15/2022  1.  Pt will have an at least 9 pt improvement in LEFS measure in order to demonstrate MCID improvement in daily function.   Goal status: INITIAL  2.  Pt will be able to demonstrate 5 deg improvement in L knee flexion and ext in order to demonstrate functional improvement in LE ROM prior to TKA.  Goal status: INITIAL  3.  Pt will be able to demonstrate reciprocal stair stepping pattern with controlled descent on L in order to demonstrate functional improvement in LE function for daily mobility.   Goal status: INITIAL  PLAN:  PT FREQUENCY: 1x/week  PT DURATION: 6 wks ( only needs 2-3 wks for HEP development) to cover any delay in scheduling  PLANNED INTERVENTIONS: Therapeutic exercises, Therapeutic activity, Neuromuscular re-education, Balance training, Gait training, Patient/Family education, Self Care, Joint mobilization, Stair training, Aquatic Therapy, Dry Needling, Electrical stimulation, Cryotherapy, Moist heat, Splintting, Taping, Vasopneumatic device, Ultrasound, Ionotophoresis 4mg /ml Dexamethasone, Manual therapy, and Re-evaluation  PLAN FOR NEXT SESSION: Progress land based HEP for L knee ROM and strength  Chrishauna Mee C. Yeny Schmoll PT, DPT 08/18/22 3:13 PM

## 2022-08-20 NOTE — H&P (Signed)
TOTAL KNEE ADMISSION H&P  Patient is being admitted for left total knee arthroplasty.  Subjective:  Chief Complaint:left knee pain.  HPI: Krystal Lang, 67 y.o. female, has a history of pain and functional disability in the left knee due to arthritis and has failed non-surgical conservative treatments for greater than 12 weeks to includeNSAID's and/or analgesics, corticosteriod injections, viscosupplementation injections, flexibility and strengthening excercises, supervised PT with diminished ADL's post treatment, and activity modification.  Onset of symptoms was gradual, starting 1 years ago with gradually worsening course since that time. The patient noted no past surgery on the left knee(s).  Patient currently rates pain in the left knee(s) at 10 out of 10 with activity. Patient has night pain, worsening of pain with activity and weight bearing, pain that interferes with activities of daily living, pain with passive range of motion, crepitus, and joint swelling.  Patient has evidence of subchondral sclerosis, periarticular osteophytes, and joint space narrowing by imaging studies. There is no active infection.  Patient Active Problem List   Diagnosis Date Noted   Unilateral primary osteoarthritis, left knee 02/27/2022   Dyspareunia, female 11/13/2011   Type 2 diabetes mellitus (HCC) 11/13/2011   Alopecia 11/13/2011   History of chicken pox    Headache(784.0)    Hypothyroidism    Menses, irregular    Hirsutism    H/O fatigue    Fibroid uterus    Past Medical History:  Diagnosis Date   Anxiety    Arthritis    Cataract    Mixed form OU   Complication of anesthesia    after knee arthroscopy pateint difficult to wake up   Diabetes mellitus    Fibroid uterus 2001   H/O fatigue 1998   Headache(784.0)    denies   Hepatitis    A, 30 years ago   Hirsutism 1998   Idiopathic   History of chicken pox    Hypothyroidism    Menses, irregular 1998    Past Surgical History:   Procedure Laterality Date   COLONOSCOPY  02/2021   DILATION AND CURETTAGE OF UTERUS  03/31/2000   In [place]   KNEE ARTHROPLASTY Right    Replaced in [place], [place]   KNEE ARTHROSCOPY Right 2008   TONSILLECTOMY     As a child    No current facility-administered medications for this encounter.   Current Outpatient Medications  Medication Sig Dispense Refill Last Dose   Ascorbic Acid (VITAMIN C PO) Take 1 tablet by mouth daily.      atorvastatin (LIPITOR) 20 MG tablet Take 20 mg by mouth 3 (three) times a week.      empagliflozin (JARDIANCE) 10 MG TABS tablet Take 10 mg by mouth daily.      glimepiride (AMARYL) 2 MG tablet Take 2 mg by mouth in the morning and at bedtime.      levothyroxine (SYNTHROID) 100 MCG tablet Take 100 mcg by mouth daily before breakfast.      sitaGLIPtin-metformin (JANUMET) 50-1000 MG tablet Take 1 tablet by mouth 2 (two) times daily with a meal.      Vitamin D, Ergocalciferol, (DRISDOL) 1.25 MG (50000 UNIT) CAPS capsule Take 50,000 Units by mouth every 7 (seven) days.      celecoxib (CELEBREX) 200 MG capsule Take 1 capsule (200 mg total) by mouth 2 (two) times daily between meals as needed. (Patient not taking: Reported on 06/10/2022) 60 capsule 3    methylPREDNISolone (MEDROL DOSEPAK) 4 MG TBPK tablet Take as directed per package  instructions (Patient not taking: Reported on 06/10/2022) 21 tablet 0    oxyCODONE (ROXICODONE) 5 MG immediate release tablet Take 1 tablet (5 mg total) by mouth every 6 (six) hours as needed for severe pain. (Patient not taking: Reported on 03/05/2022) 15 tablet 0    No Known Allergies  Social History   Tobacco Use   Smoking status: Never   Smokeless tobacco: Never  Substance Use Topics   Alcohol use: No    Family History  Problem Relation Age of Onset   Diabetes Mother    Diabetes Father    Heart disease Father        MI   Diabetes Sister    Macular degeneration Sister    Diabetes Brother    Heart disease Brother         Angioplasty     Review of Systems  Objective:  Physical Exam Vitals reviewed.  Constitutional:      Appearance: Normal appearance. She is normal weight.  HENT:     Head: Normocephalic and atraumatic.  Eyes:     Extraocular Movements: Extraocular movements intact.     Pupils: Pupils are equal, round, and reactive to light.  Cardiovascular:     Rate and Rhythm: Normal rate and regular rhythm.     Pulses: Normal pulses.  Pulmonary:     Effort: Pulmonary effort is normal.     Breath sounds: Normal breath sounds.  Abdominal:     Palpations: Abdomen is soft.  Musculoskeletal:     Cervical back: Normal range of motion and neck supple.     Left knee: Effusion, bony tenderness and crepitus present. Decreased range of motion. Tenderness present over the medial joint line and lateral joint line. Abnormal alignment.  Neurological:     Mental Status: She is alert and oriented to person, place, and time.  Psychiatric:        Behavior: Behavior normal.     Vital signs in last 24 hours:    Labs:   Estimated body mass index is 28.7 kg/m as calculated from the following:   Height as of 08/15/22: 5\' 3"  (1.6 m).   Weight as of 08/15/22: 73.5 kg.   Imaging Review Plain radiographs demonstrate severe degenerative joint disease of the left knee(s). The overall alignment ismild varus. The bone quality appears to be good for age and reported activity level.      Assessment/Plan:  End stage arthritis, left knee   The patient history, physical examination, clinical judgment of the provider and imaging studies are consistent with end stage degenerative joint disease of the left knee(s) and total knee arthroplasty is deemed medically necessary. The treatment options including medical management, injection therapy arthroscopy and arthroplasty were discussed at length. The risks and benefits of total knee arthroplasty were presented and reviewed. The risks due to aseptic loosening, infection,  stiffness, patella tracking problems, thromboembolic complications and other imponderables were discussed. The patient acknowledged the explanation, agreed to proceed with the plan and consent was signed. Patient is being admitted for inpatient treatment for surgery, pain control, PT, OT, prophylactic antibiotics, VTE prophylaxis, progressive ambulation and ADL's and discharge planning. The patient is planning to be discharged home with home health services

## 2022-08-21 ENCOUNTER — Ambulatory Visit (HOSPITAL_BASED_OUTPATIENT_CLINIC_OR_DEPARTMENT_OTHER): Payer: Medicare Other | Admitting: Anesthesiology

## 2022-08-21 ENCOUNTER — Observation Stay (HOSPITAL_COMMUNITY): Payer: Medicare Other

## 2022-08-21 ENCOUNTER — Other Ambulatory Visit: Payer: Self-pay

## 2022-08-21 ENCOUNTER — Ambulatory Visit (HOSPITAL_COMMUNITY): Payer: Medicare Other | Admitting: Anesthesiology

## 2022-08-21 ENCOUNTER — Encounter (HOSPITAL_COMMUNITY)
Admission: RE | Disposition: A | Discharge status: Home / Self Care | Payer: Self-pay | Source: Home / Self Care | Attending: Orthopaedic Surgery

## 2022-08-21 ENCOUNTER — Observation Stay (HOSPITAL_COMMUNITY)
Admission: RE | Admit: 2022-08-21 | Discharge: 2022-08-22 | Disposition: A | Discharge status: Home / Self Care | Payer: Medicare Other | Attending: Orthopaedic Surgery | Admitting: Orthopaedic Surgery

## 2022-08-21 ENCOUNTER — Encounter (HOSPITAL_COMMUNITY): Payer: Self-pay | Admitting: Orthopaedic Surgery

## 2022-08-21 DIAGNOSIS — E119 Type 2 diabetes mellitus without complications: Secondary | ICD-10-CM | POA: Diagnosis not present

## 2022-08-21 DIAGNOSIS — Z7984 Long term (current) use of oral hypoglycemic drugs: Secondary | ICD-10-CM

## 2022-08-21 DIAGNOSIS — Z96652 Presence of left artificial knee joint: Secondary | ICD-10-CM

## 2022-08-21 DIAGNOSIS — Z96651 Presence of right artificial knee joint: Secondary | ICD-10-CM | POA: Insufficient documentation

## 2022-08-21 DIAGNOSIS — M1712 Unilateral primary osteoarthritis, left knee: Secondary | ICD-10-CM | POA: Diagnosis present

## 2022-08-21 DIAGNOSIS — E039 Hypothyroidism, unspecified: Secondary | ICD-10-CM

## 2022-08-21 DIAGNOSIS — Z79899 Other long term (current) drug therapy: Secondary | ICD-10-CM | POA: Insufficient documentation

## 2022-08-21 HISTORY — PX: TOTAL KNEE ARTHROPLASTY: SHX125

## 2022-08-21 LAB — GLUCOSE, CAPILLARY
Glucose-Capillary: 143 mg/dL — ABNORMAL HIGH (ref 70–99)
Glucose-Capillary: 141 mg/dL — ABNORMAL HIGH (ref 70–99)
Glucose-Capillary: 283 mg/dL — ABNORMAL HIGH (ref 70–99)
Glucose-Capillary: 259 mg/dL — ABNORMAL HIGH (ref 70–99)
Glucose-Capillary: 211 mg/dL — ABNORMAL HIGH (ref 70–99)

## 2022-08-21 LAB — TYPE AND SCREEN
ABO/RH(D): A POS
Antibody Screen: NEGATIVE

## 2022-08-21 SURGERY — ARTHROPLASTY, KNEE, TOTAL
Anesthesia: Spinal | Site: Knee | Laterality: Left

## 2022-08-21 MED ORDER — ONDANSETRON HCL 4 MG/2ML IJ SOLN: 4.0000 mg | Freq: Four times a day (QID) | INTRAMUSCULAR | Status: DC | PRN

## 2022-08-21 MED ORDER — BUPIVACAINE-EPINEPHRINE 0.25% -1:200000 IJ SOLN
INTRAMUSCULAR | Status: DC | PRN
  Administered 2022-08-21: 30 mL

## 2022-08-21 MED ORDER — HYDROMORPHONE HCL 1 MG/ML IJ SOLN
INTRAMUSCULAR | Status: AC
  Filled 2022-08-21: qty 1

## 2022-08-21 MED ORDER — METOCLOPRAMIDE HCL 5 MG PO TABS
5.0000 mg | ORAL_TABLET | Freq: Three times a day (TID) | ORAL | Status: DC | PRN
  Administered 2022-08-21: 10 mg via ORAL
  Filled 2022-08-21: qty 2

## 2022-08-21 MED ORDER — POLYETHYLENE GLYCOL 3350 17 G PO PACK: 17.0000 g | PACK | Freq: Every day | ORAL | Status: DC | PRN

## 2022-08-21 MED ORDER — ONDANSETRON HCL 4 MG PO TABS
4.0000 mg | ORAL_TABLET | Freq: Four times a day (QID) | ORAL | Status: DC | PRN
  Administered 2022-08-21 – 2022-08-22 (×2): 4 mg via ORAL
  Filled 2022-08-21 (×2): qty 1

## 2022-08-21 MED ORDER — ACETAMINOPHEN 325 MG PO TABS
325.0000 mg | ORAL_TABLET | Freq: Four times a day (QID) | ORAL | Status: DC | PRN
  Administered 2022-08-22: 650 mg via ORAL
  Filled 2022-08-21: qty 2

## 2022-08-21 MED ORDER — INSULIN ASPART 100 UNIT/ML IJ SOLN
0.0000 [IU] | Freq: Three times a day (TID) | INTRAMUSCULAR | Status: DC
  Administered 2022-08-21 (×2): 8 [IU] via SUBCUTANEOUS
  Administered 2022-08-22: 3 [IU] via SUBCUTANEOUS

## 2022-08-21 MED ORDER — ACETAMINOPHEN 325 MG PO TABS: 325.0000 mg | ORAL_TABLET | Freq: Once | ORAL | Status: DC | PRN

## 2022-08-21 MED ORDER — MIDAZOLAM HCL 2 MG/2ML IJ SOLN
1.0000 mg | Freq: Once | INTRAMUSCULAR | Status: AC
  Administered 2022-08-21: 1 mg via INTRAVENOUS
  Filled 2022-08-21: qty 2

## 2022-08-21 MED ORDER — HYDROMORPHONE HCL 1 MG/ML IJ SOLN: 0.5000 mg | INTRAMUSCULAR | Status: DC | PRN

## 2022-08-21 MED ORDER — ORAL CARE MOUTH RINSE: 15.0000 mL | Freq: Once | OROMUCOSAL | Status: AC

## 2022-08-21 MED ORDER — ORAL CARE MOUTH RINSE: 15.0000 mL | Freq: Once | OROMUCOSAL | Status: DC

## 2022-08-21 MED ORDER — PROPOFOL 10 MG/ML IV BOLUS
INTRAVENOUS | Status: DC | PRN
  Administered 2022-08-21 (×2): 20 mg via INTRAVENOUS
  Administered 2022-08-21: 10 mg via INTRAVENOUS

## 2022-08-21 MED ORDER — EMPAGLIFLOZIN 10 MG PO TABS
10.0000 mg | ORAL_TABLET | Freq: Every day | ORAL | Status: DC
  Administered 2022-08-22: 10 mg via ORAL
  Filled 2022-08-21: qty 1

## 2022-08-21 MED ORDER — BUPIVACAINE IN DEXTROSE 0.75-8.25 % IT SOLN
INTRATHECAL | Status: DC | PRN
  Administered 2022-08-21: 1.8 mL via INTRATHECAL

## 2022-08-21 MED ORDER — METOCLOPRAMIDE HCL 5 MG/ML IJ SOLN: 5.0000 mg | Freq: Three times a day (TID) | INTRAMUSCULAR | Status: DC | PRN

## 2022-08-21 MED ORDER — FENTANYL CITRATE (PF) 100 MCG/2ML IJ SOLN
INTRAMUSCULAR | Status: DC | PRN
  Administered 2022-08-21: 50 ug via INTRAVENOUS

## 2022-08-21 MED ORDER — ZOLPIDEM TARTRATE 5 MG PO TABS
5.0000 mg | ORAL_TABLET | Freq: Every evening | ORAL | Status: DC | PRN
  Administered 2022-08-21: 5 mg via ORAL
  Filled 2022-08-21: qty 1

## 2022-08-21 MED ORDER — METHOCARBAMOL 500 MG IVPB - SIMPLE MED: 500.0000 mg | Freq: Four times a day (QID) | INTRAVENOUS | Status: DC | PRN

## 2022-08-21 MED ORDER — DOCUSATE SODIUM 100 MG PO CAPS
100.0000 mg | ORAL_CAPSULE | Freq: Two times a day (BID) | ORAL | Status: DC
  Administered 2022-08-21 – 2022-08-22 (×2): 100 mg via ORAL
  Filled 2022-08-21 (×2): qty 1

## 2022-08-21 MED ORDER — 0.9 % SODIUM CHLORIDE (POUR BTL) OPTIME
TOPICAL | Status: DC | PRN
  Administered 2022-08-21: 1000 mL

## 2022-08-21 MED ORDER — ONDANSETRON HCL 4 MG/2ML IJ SOLN
INTRAMUSCULAR | Status: AC
  Filled 2022-08-21: qty 2

## 2022-08-21 MED ORDER — TRANEXAMIC ACID-NACL 1000-0.7 MG/100ML-% IV SOLN
1000.0000 mg | INTRAVENOUS | Status: AC
  Administered 2022-08-21: 1000 mg via INTRAVENOUS
  Filled 2022-08-21: qty 100

## 2022-08-21 MED ORDER — EPINEPHRINE PF 1 MG/ML IJ SOLN
INTRAMUSCULAR | Status: AC
  Filled 2022-08-21: qty 1

## 2022-08-21 MED ORDER — TRAMADOL HCL 50 MG PO TABS
50.0000 mg | ORAL_TABLET | Freq: Four times a day (QID) | ORAL | Status: DC
  Administered 2022-08-21 – 2022-08-22 (×3): 50 mg via ORAL
  Filled 2022-08-21 (×4): qty 1

## 2022-08-21 MED ORDER — AMISULPRIDE (ANTIEMETIC) 5 MG/2ML IV SOLN
INTRAVENOUS | Status: AC
  Filled 2022-08-21: qty 4

## 2022-08-21 MED ORDER — PROMETHAZINE HCL 25 MG/ML IJ SOLN
INTRAMUSCULAR | Status: AC
  Filled 2022-08-21: qty 1

## 2022-08-21 MED ORDER — METHOCARBAMOL 500 MG PO TABS
500.0000 mg | ORAL_TABLET | Freq: Four times a day (QID) | ORAL | Status: DC | PRN
  Administered 2022-08-21 – 2022-08-22 (×3): 500 mg via ORAL
  Filled 2022-08-21 (×4): qty 1

## 2022-08-21 MED ORDER — PROPOFOL 1000 MG/100ML IV EMUL
INTRAVENOUS | Status: AC
  Filled 2022-08-21: qty 100

## 2022-08-21 MED ORDER — FENTANYL CITRATE PF 50 MCG/ML IJ SOSY
50.0000 ug | PREFILLED_SYRINGE | Freq: Once | INTRAMUSCULAR | Status: AC
  Administered 2022-08-21: 50 ug via INTRAVENOUS
  Filled 2022-08-21: qty 2

## 2022-08-21 MED ORDER — DEXAMETHASONE SODIUM PHOSPHATE 10 MG/ML IJ SOLN
INTRAMUSCULAR | Status: DC | PRN
  Administered 2022-08-21: 10 mg via INTRAVENOUS

## 2022-08-21 MED ORDER — CEFAZOLIN SODIUM-DEXTROSE 1-4 GM/50ML-% IV SOLN
1.0000 g | Freq: Four times a day (QID) | INTRAVENOUS | Status: AC
  Administered 2022-08-21 (×2): 1 g via INTRAVENOUS
  Filled 2022-08-21 (×2): qty 50

## 2022-08-21 MED ORDER — CHLORHEXIDINE GLUCONATE 0.12 % MT SOLN
15.0000 mL | Freq: Once | OROMUCOSAL | Status: AC
  Administered 2022-08-21: 15 mL via OROMUCOSAL

## 2022-08-21 MED ORDER — ACETAMINOPHEN 10 MG/ML IV SOLN
1000.0000 mg | Freq: Once | INTRAVENOUS | Status: DC | PRN
  Administered 2022-08-21: 1000 mg via INTRAVENOUS

## 2022-08-21 MED ORDER — PROPOFOL 500 MG/50ML IV EMUL
INTRAVENOUS | Status: DC | PRN
  Administered 2022-08-21: 100 ug/kg/min via INTRAVENOUS

## 2022-08-21 MED ORDER — LIDOCAINE HCL (CARDIAC) PF 100 MG/5ML IV SOSY
PREFILLED_SYRINGE | INTRAVENOUS | Status: DC | PRN
  Administered 2022-08-21: 50 mg via INTRAVENOUS

## 2022-08-21 MED ORDER — PANTOPRAZOLE SODIUM 40 MG PO TBEC
40.0000 mg | DELAYED_RELEASE_TABLET | Freq: Every day | ORAL | Status: DC
  Administered 2022-08-21 – 2022-08-22 (×2): 40 mg via ORAL
  Filled 2022-08-21 (×2): qty 1

## 2022-08-21 MED ORDER — SODIUM CHLORIDE 0.9 % IV SOLN: INTRAVENOUS | Status: DC

## 2022-08-21 MED ORDER — METFORMIN HCL 500 MG PO TABS
1000.0000 mg | ORAL_TABLET | Freq: Two times a day (BID) | ORAL | Status: DC
  Administered 2022-08-21 – 2022-08-22 (×2): 1000 mg via ORAL
  Filled 2022-08-21 (×2): qty 2

## 2022-08-21 MED ORDER — ACETAMINOPHEN 10 MG/ML IV SOLN
INTRAVENOUS | Status: AC
  Filled 2022-08-21: qty 100

## 2022-08-21 MED ORDER — DEXAMETHASONE SODIUM PHOSPHATE 10 MG/ML IJ SOLN
INTRAMUSCULAR | Status: AC
  Filled 2022-08-21: qty 1

## 2022-08-21 MED ORDER — LIDOCAINE HCL (PF) 2 % IJ SOLN
INTRAMUSCULAR | Status: AC
  Filled 2022-08-21: qty 5

## 2022-08-21 MED ORDER — LEVOTHYROXINE SODIUM 100 MCG PO TABS
100.0000 ug | ORAL_TABLET | Freq: Every day | ORAL | Status: DC
  Administered 2022-08-22: 100 ug via ORAL
  Filled 2022-08-21: qty 1

## 2022-08-21 MED ORDER — ROPIVACAINE HCL 5 MG/ML IJ SOLN
INTRAMUSCULAR | Status: DC | PRN
  Administered 2022-08-21: 30 mL via PERINEURAL

## 2022-08-21 MED ORDER — DIPHENHYDRAMINE HCL 12.5 MG/5ML PO ELIX: 12.5000 mg | ORAL_SOLUTION | ORAL | Status: DC | PRN

## 2022-08-21 MED ORDER — ASPIRIN 81 MG PO CHEW
81.0000 mg | CHEWABLE_TABLET | Freq: Two times a day (BID) | ORAL | Status: DC
  Administered 2022-08-21 – 2022-08-22 (×2): 81 mg via ORAL
  Filled 2022-08-21 (×2): qty 1

## 2022-08-21 MED ORDER — PHENOL 1.4 % MT LIQD: 1.0000 | OROMUCOSAL | Status: DC | PRN

## 2022-08-21 MED ORDER — LACTATED RINGERS IV SOLN: INTRAVENOUS | Status: DC

## 2022-08-21 MED ORDER — PROPOFOL 10 MG/ML IV BOLUS
INTRAVENOUS | Status: AC
  Filled 2022-08-21: qty 20

## 2022-08-21 MED ORDER — SITAGLIPTIN PHOS-METFORMIN HCL 50-1000 MG PO TABS: 1.0000 | ORAL_TABLET | Freq: Two times a day (BID) | ORAL | Status: DC

## 2022-08-21 MED ORDER — OXYCODONE HCL 5 MG PO TABS
5.0000 mg | ORAL_TABLET | ORAL | Status: DC | PRN
  Administered 2022-08-22: 5 mg via ORAL
  Filled 2022-08-21: qty 1
  Filled 2022-08-21: qty 2

## 2022-08-21 MED ORDER — GLIMEPIRIDE 2 MG PO TABS
2.0000 mg | ORAL_TABLET | Freq: Two times a day (BID) | ORAL | Status: DC
  Administered 2022-08-21 – 2022-08-22 (×2): 2 mg via ORAL
  Filled 2022-08-21 (×3): qty 1

## 2022-08-21 MED ORDER — FENTANYL CITRATE (PF) 100 MCG/2ML IJ SOLN
INTRAMUSCULAR | Status: AC
  Filled 2022-08-21: qty 2

## 2022-08-21 MED ORDER — HYDROMORPHONE HCL 1 MG/ML IJ SOLN
0.2500 mg | INTRAMUSCULAR | Status: DC | PRN
  Administered 2022-08-21 (×2): 0.5 mg via INTRAVENOUS

## 2022-08-21 MED ORDER — CHLORHEXIDINE GLUCONATE 0.12 % MT SOLN: 15.0000 mL | Freq: Once | OROMUCOSAL | Status: DC

## 2022-08-21 MED ORDER — VITAMIN D (ERGOCALCIFEROL) 1.25 MG (50000 UNIT) PO CAPS: 50000.0000 [IU] | ORAL_CAPSULE | ORAL | Status: DC

## 2022-08-21 MED ORDER — OXYCODONE HCL 5 MG PO TABS
10.0000 mg | ORAL_TABLET | ORAL | Status: DC | PRN
  Administered 2022-08-21 (×2): 10 mg via ORAL
  Administered 2022-08-22: 5 mg via ORAL
  Filled 2022-08-21 (×2): qty 2

## 2022-08-21 MED ORDER — MENTHOL 3 MG MT LOZG: 1.0000 | LOZENGE | OROMUCOSAL | Status: DC | PRN

## 2022-08-21 MED ORDER — HYDROMORPHONE HCL 1 MG/ML IJ SOLN
INTRAMUSCULAR | Status: AC
  Administered 2022-08-21: 0.5 mg via INTRAVENOUS
  Filled 2022-08-21: qty 1

## 2022-08-21 MED ORDER — POVIDONE-IODINE 10 % EX SWAB
2.0000 | Freq: Once | CUTANEOUS | Status: AC
  Administered 2022-08-21: 2 via TOPICAL

## 2022-08-21 MED ORDER — ONDANSETRON HCL 4 MG/2ML IJ SOLN
INTRAMUSCULAR | Status: DC | PRN
  Administered 2022-08-21: 4 mg via INTRAVENOUS

## 2022-08-21 MED ORDER — ACETAMINOPHEN 160 MG/5ML PO SOLN: 325.0000 mg | Freq: Once | ORAL | Status: DC | PRN

## 2022-08-21 MED ORDER — ALUM & MAG HYDROXIDE-SIMETH 200-200-20 MG/5ML PO SUSP: 30.0000 mL | ORAL | Status: DC | PRN

## 2022-08-21 MED ORDER — SODIUM CHLORIDE 0.9 % IR SOLN
Status: DC | PRN
  Administered 2022-08-21: 1000 mL

## 2022-08-21 MED ORDER — PHENYLEPHRINE HCL-NACL 20-0.9 MG/250ML-% IV SOLN
INTRAVENOUS | Status: DC | PRN
  Administered 2022-08-21: 25 ug/min via INTRAVENOUS

## 2022-08-21 MED ORDER — INSULIN ASPART 100 UNIT/ML IJ SOLN
0.0000 [IU] | Freq: Every day | INTRAMUSCULAR | Status: DC
  Administered 2022-08-21: 2 [IU] via SUBCUTANEOUS

## 2022-08-21 MED ORDER — BUPIVACAINE HCL 0.25 % IJ SOLN
INTRAMUSCULAR | Status: AC
  Filled 2022-08-21: qty 1

## 2022-08-21 MED ORDER — CEFAZOLIN SODIUM-DEXTROSE 2-4 GM/100ML-% IV SOLN
2.0000 g | INTRAVENOUS | Status: AC
  Administered 2022-08-21: 2 g via INTRAVENOUS
  Filled 2022-08-21: qty 100

## 2022-08-21 MED ORDER — AMISULPRIDE (ANTIEMETIC) 5 MG/2ML IV SOLN
10.0000 mg | Freq: Once | INTRAVENOUS | Status: AC | PRN
  Administered 2022-08-21: 10 mg via INTRAVENOUS

## 2022-08-21 MED ORDER — PROMETHAZINE HCL 25 MG/ML IJ SOLN: 6.2500 mg | INTRAMUSCULAR | Status: DC | PRN

## 2022-08-21 MED ORDER — LINAGLIPTIN 5 MG PO TABS
5.0000 mg | ORAL_TABLET | Freq: Every day | ORAL | Status: DC
  Administered 2022-08-22: 5 mg via ORAL
  Filled 2022-08-21: qty 1

## 2022-08-21 SURGICAL SUPPLY — 66 items
APL SKNCLS STERI-STRIP NONHPOA (GAUZE/BANDAGES/DRESSINGS)
BAG COUNTER SPONGE SURGICOUNT (BAG) IMPLANT
BAG SPEC THK2 15X12 ZIP CLS (MISCELLANEOUS) ×1
BAG SPNG CNTER NS LX DISP (BAG)
BAG ZIPLOCK 12X15 (MISCELLANEOUS) ×1 IMPLANT
BENZOIN TINCTURE PRP APPL 2/3 (GAUZE/BANDAGES/DRESSINGS) IMPLANT
BLADE SAG 18X100X1.27 (BLADE) ×1 IMPLANT
BLADE SAW SGTL 13.0X1.19X90.0M (BLADE) IMPLANT
BLADE SURG SZ10 CARB STEEL (BLADE) ×2 IMPLANT
BNDG CMPR 5X62 HK CLSR LF (GAUZE/BANDAGES/DRESSINGS) ×2
BNDG CMPR MED 10X6 ELC LF (GAUZE/BANDAGES/DRESSINGS) ×1
BNDG ELASTIC 6INX 5YD STR LF (GAUZE/BANDAGES/DRESSINGS) ×2 IMPLANT
BNDG ELASTIC 6X10 VLCR STRL LF (GAUZE/BANDAGES/DRESSINGS) IMPLANT
BOWL SMART MIX CTS (DISPOSABLE) IMPLANT
CEMENT BONE R 1X40 (Cement) IMPLANT
CEMENT BONE SIMPLEX SPEEDSET (Cement) IMPLANT
COMP MED POLY AS PERS S6-7 12 (Joint) ×1 IMPLANT
COMP TIB PS KNEE D 0D LT (Joint) ×1 IMPLANT
COMPONENT MED PLY PERSS6-7 12 (Joint) IMPLANT
COMPONET TIB PS KNEE D 0D LT (Joint) IMPLANT
COOLER ICEMAN CLASSIC (MISCELLANEOUS) ×1 IMPLANT
COVER SURGICAL LIGHT HANDLE (MISCELLANEOUS) ×1 IMPLANT
CUFF TOURN SGL QUICK 34 (TOURNIQUET CUFF) ×1
CUFF TRNQT CYL 34X4.125X (TOURNIQUET CUFF) ×1 IMPLANT
DRAPE INCISE IOBAN 66X45 STRL (DRAPES) ×1 IMPLANT
DRAPE U-SHAPE 47X51 STRL (DRAPES) ×1 IMPLANT
DURAPREP 26ML APPLICATOR (WOUND CARE) ×1 IMPLANT
ELECT BLADE TIP CTD 4 INCH (ELECTRODE) ×1 IMPLANT
ELECT REM PT RETURN 15FT ADLT (MISCELLANEOUS) ×1 IMPLANT
FEMUR CMT CR STD SZ 6 LT KNEE (Joint) ×1 IMPLANT
FEMUR CMTD CR STD SZ 6 LT KNEE (Joint) IMPLANT
GAUZE PAD ABD 7.5X8 STRL (GAUZE/BANDAGES/DRESSINGS) IMPLANT
GAUZE PAD ABD 8X10 STRL (GAUZE/BANDAGES/DRESSINGS) ×2 IMPLANT
GAUZE SPONGE 4X4 12PLY STRL (GAUZE/BANDAGES/DRESSINGS) ×1 IMPLANT
GAUZE XEROFORM 1X8 LF (GAUZE/BANDAGES/DRESSINGS) IMPLANT
GLOVE BIO SURGEON STRL SZ7.5 (GLOVE) ×1 IMPLANT
GLOVE BIOGEL PI IND STRL 8 (GLOVE) ×2 IMPLANT
GLOVE ECLIPSE 8.0 STRL XLNG CF (GLOVE) ×1 IMPLANT
GOWN STRL REUS W/ TWL XL LVL3 (GOWN DISPOSABLE) ×2 IMPLANT
GOWN STRL REUS W/TWL XL LVL3 (GOWN DISPOSABLE) ×2
HANDPIECE INTERPULSE COAX TIP (DISPOSABLE) ×1
HOLDER FOLEY CATH W/STRAP (MISCELLANEOUS) IMPLANT
IMMOBILIZER KNEE 20 (SOFTGOODS) ×1
IMMOBILIZER KNEE 20 THIGH 36 (SOFTGOODS) ×1 IMPLANT
KIT TURNOVER KIT A (KITS) IMPLANT
NS IRRIG 1000ML POUR BTL (IV SOLUTION) ×1 IMPLANT
PACK TOTAL KNEE CUSTOM (KITS) ×1 IMPLANT
PAD COLD SHLDR WRAP-ON (PAD) ×1 IMPLANT
PADDING CAST COTTON 6X4 STRL (CAST SUPPLIES) ×2 IMPLANT
PIN DRILL HDLS TROCAR 75 4PK (PIN) IMPLANT
PROTECTOR NERVE ULNAR (MISCELLANEOUS) ×1 IMPLANT
SCREW FEMALE HEX FIX 25X2.5 (ORTHOPEDIC DISPOSABLE SUPPLIES) IMPLANT
SET HNDPC FAN SPRY TIP SCT (DISPOSABLE) ×1 IMPLANT
SET PAD KNEE POSITIONER (MISCELLANEOUS) ×1 IMPLANT
SPIKE FLUID TRANSFER (MISCELLANEOUS) IMPLANT
STAPLER VISISTAT 35W (STAPLE) IMPLANT
STEM POLY PAT PLY 32M KNEE (Knees) IMPLANT
STRIP CLOSURE SKIN 1/2X4 (GAUZE/BANDAGES/DRESSINGS) IMPLANT
SUT MNCRL AB 4-0 PS2 18 (SUTURE) IMPLANT
SUT VIC AB 0 CT1 27 (SUTURE) ×1
SUT VIC AB 0 CT1 27XBRD ANTBC (SUTURE) ×1 IMPLANT
SUT VIC AB 1 CT1 36 (SUTURE) ×2 IMPLANT
SUT VIC AB 2-0 CT1 27 (SUTURE) ×2
SUT VIC AB 2-0 CT1 TAPERPNT 27 (SUTURE) ×2 IMPLANT
TRAY FOLEY MTR SLVR 16FR STAT (SET/KITS/TRAYS/PACK) IMPLANT
WATER STERILE IRR 1000ML POUR (IV SOLUTION) ×2 IMPLANT

## 2022-08-21 NOTE — Anesthesia Procedure Notes (Signed)
Procedure Name: MAC Date/Time: 08/21/2022 9:45 AM  Performed by: Lovie Chol, CRNAPre-anesthesia Checklist: Patient identified, Emergency Drugs available, Suction available and Patient being monitored Oxygen Delivery Method: Simple face mask

## 2022-08-21 NOTE — Anesthesia Postprocedure Evaluation (Signed)
Anesthesia Post Note  Patient: Krystal Lang  Procedure(s) Performed: LEFT TOTAL KNEE ARTHROPLASTY (Left: Knee)     Patient location during evaluation: PACU Anesthesia Type: Spinal Level of consciousness: oriented and awake and alert Pain management: pain level controlled Vital Signs Assessment: post-procedure vital signs reviewed and stable Respiratory status: spontaneous breathing, respiratory function stable and patient connected to nasal cannula oxygen Cardiovascular status: blood pressure returned to baseline and stable Postop Assessment: no headache, no backache and no apparent nausea or vomiting Anesthetic complications: no  No notable events documented.  Last Vitals:  Vitals:   08/21/22 1300 08/21/22 1315  BP:    Pulse: 62 61  Resp: 12 14  Temp:    SpO2: 98% 100%    Last Pain:  Vitals:   08/21/22 1315  TempSrc:   PainSc: 3                  Shelton Silvas

## 2022-08-21 NOTE — Interval H&P Note (Signed)
History and Physical Interval Note: The patient understands that she is here today for a left total knee replacement to treat her severe left knee arthritis.  There has been no acute or interval change in her medical status.  The risks and benefits of surgery have been discussed in detail and informed consent has been obtained.  Her left operative knee has been marked.  08/21/2022 8:45 AM  Krystal Lang  has presented today for surgery, with the diagnosis of OSTEOARTHRITIS / DEGENERATIVE JOINT DISEASE LEFT KNEE.  The various methods of treatment have been discussed with the patient and family. After consideration of risks, benefits and other options for treatment, the patient has consented to  Procedure(s): LEFT TOTAL KNEE ARTHROPLASTY (Left) as a surgical intervention.  The patient's history has been reviewed, patient examined, no change in status, stable for surgery.  I have reviewed the patient's chart and labs.  Questions were answered to the patient's satisfaction.     Kathryne Hitch

## 2022-08-21 NOTE — Discharge Instructions (Signed)

## 2022-08-21 NOTE — Anesthesia Procedure Notes (Addendum)
Spinal  Patient location during procedure: OR Start time: 08/21/2022 9:40 AM End time: 08/21/2022 9:45 AM Reason for block: surgical anesthesia Staffing Performed: resident/CRNA  Resident/CRNA: Lovie Chol, CRNA Performed by: Lovie Chol, CRNA Authorized by: Shelton Silvas, MD   Preanesthetic Checklist Completed: patient identified, IV checked, site marked, risks and benefits discussed, surgical consent, monitors and equipment checked, pre-op evaluation and timeout performed Spinal Block Patient position: sitting Prep: DuraPrep Patient monitoring: heart rate, cardiac monitor, continuous pulse ox and blood pressure Approach: midline Location: L4-5 Injection technique: single-shot Needle Needle type: Pencan  Needle gauge: 24 G Needle length: 10 cm Assessment Sensory level: T6 Events: CSF return Additional Notes IV functioning, monitors applied to pt. Expiration date of kit checked and confirmed to be in date.  Sterile prep and drape, including hand hygiene, mask and sterile gloves were used. The patient was positioned and the spine was prepped. Skin was anesthetized with lidocaine. Free flow of clear CSF obtained prior to injecting local anesthetic into the CSF. Spinal needle aspirated freely following injection. Needle was carefully withdrawn, and pt tolerated procedure well. Loss of motor and sensory on exam post injection.

## 2022-08-21 NOTE — Anesthesia Procedure Notes (Signed)
Anesthesia Regional Block: Adductor canal block   Pre-Anesthetic Checklist: , timeout performed,  Correct Patient, Correct Site, Correct Laterality,  Correct Procedure, Correct Position, site marked,  Risks and benefits discussed,  Surgical consent,  Pre-op evaluation,  At surgeon's request and post-op pain management  Laterality: Left  Prep: chloraprep       Needles:  Injection technique: Single-shot  Needle Type: Echogenic Stimulator Needle     Needle Length: 9cm  Needle Gauge: 21     Additional Needles:   Procedures:,,,, ultrasound used (permanent image in chart),,    Narrative:  Start time: 08/21/2022 8:55 AM End time: 08/21/2022 9:00 AM Injection made incrementally with aspirations every 5 mL.  Performed by: Personally  Anesthesiologist: Shelton Silvas, MD  Additional Notes: Discussed risks and benefits of the nerve block in detail, including but not limited vascular injury, permanent nerve damage and infection.   Patient tolerated the procedure well. Local anesthetic introduced in an incremental fashion under minimal resistance after negative aspirations. No paresthesias were elicited. After completion of the procedure, no acute issues were identified and patient continued to be monitored by RN.

## 2022-08-21 NOTE — Evaluation (Signed)
Physical Therapy Evaluation Patient Details Name: Krystal Lang MRN: [id number] DOB: [birth year] Today's Date: 08/21/2022  History of Present Illness  67 yo female S/P LTKA 5/23/4. PMH: DM, anxiety, RTKA  Clinical Impression  Pt admitted with above diagnosis.  Pt currently with functional limitations due to the deficits listed below (see PT Problem List). Pt will benefit from acute skilled PT to increase their independence and safety with mobility to allow discharge.     The patient ambulated x 25' with RW, Patient reports increased pain, RN notified, ice replaced. Patient  should progress to return home with family support.       Recommendations for follow up therapy are one component of a multi-disciplinary discharge planning process, led by the attending physician.  Recommendations may be updated based on patient status, additional functional criteria and insurance authorization.  Follow Up Recommendations       Assistance Recommended at Discharge Intermittent Supervision/Assistance  Patient can return home with the following  A little help with walking and/or transfers;A little help with bathing/dressing/bathroom;Assistance with cooking/housework;Assist for transportation;Help with stairs or ramp for entrance    Equipment Recommendations None recommended by PT  Recommendations for Other Services       Functional Status Assessment Patient has had a recent decline in their functional status and demonstrates the ability to make significant improvements in function in a reasonable and predictable amount of time.     Precautions / Restrictions Precautions Precautions: Knee;Fall Required Braces or Orthoses: Knee Immobilizer - Left Knee Immobilizer - Left: Discontinue once straight leg raise with < 10 degree lag Restrictions Weight Bearing Restrictions: No      Mobility  Bed Mobility Overal bed mobility: Needs Assistance Bed Mobility: Supine to Sit     Supine to sit: Min  assist     General bed mobility comments: used belt to move  LLE to bed edge, assisted  to slide to bed edg and lower  LLE to floor,    Transfers Overall transfer level: Needs assistance Equipment used: Rolling walker (2 wheels) Transfers: Sit to/from Stand Sit to Stand: Min assist, From elevated surface           General transfer comment: cues for hand and left leg position, Hand position    Ambulation/Gait Ambulation/Gait assistance: Min assist Gait Distance (Feet): 25 Feet Assistive device: Rolling walker (2 wheels) Gait Pattern/deviations: Step-to pattern, Antalgic Gait velocity: decr     General Gait Details: Cues for sequence,  Stairs            Wheelchair Mobility    Modified Rankin (Stroke Patients Only)       Balance Overall balance assessment: Needs assistance Sitting-balance support: Bilateral upper extremity supported, Feet supported Sitting balance-Leahy Scale: Good     Standing balance support: Bilateral upper extremity supported, During functional activity, Reliant on assistive device for balance Standing balance-Leahy Scale: Fair                               Pertinent Vitals/Pain Pain Assessment Pain Assessment: 0-10 Pain Score: 7  Pain Location: left knee when moving, bending, lower back Pain Descriptors / Indicators: Aching, Discomfort, Grimacing, Moaning Pain Intervention(s): Monitored during session, Ice applied, Patient requesting pain meds-RN notified    Home Living Family/patient expects to be discharged to:: Private residence Living Arrangements: Spouse/significant other;Children Available Help at Discharge: Family;Available 24 hours/day Type of Home: House Home Access: Stairs to enter Entrance Stairs-Rails: Left  Entrance Stairs-Number of Steps: 5 Alternate Level Stairs-Number of Steps: has a stair glide- flight straight up Home Layout: Two level;1/2 bath on main level;Bed/bath upstairs Home Equipment:  Rolling Walker (2 wheels);Cane - single point      Prior Function Prior Level of Function : Independent/Modified Independent                     Hand Dominance   Dominant Hand: Right    Extremity/Trunk Assessment   Upper Extremity Assessment Upper Extremity Assessment: Overall WFL for tasks assessed    Lower Extremity Assessment Lower Extremity Assessment: LLE deficits/detail LLE Deficits / Details: SLR with assistance, knee flexion to ~50*    Cervical / Trunk Assessment Cervical / Trunk Assessment: Normal  Communication   Communication: No difficulties  Cognition Arousal/Alertness: Awake/alert Behavior During Therapy: WFL for tasks assessed/performed Overall Cognitive Status: Within Functional Limits for tasks assessed                                          General Comments      Exercises Total Joint Exercises Long Arc Quad: AAROM, Left, 5 reps, Seated Knee Flexion: AAROM, Left, 5 reps, Seated   Assessment/Plan    PT Assessment Patient needs continued PT services  PT Problem List Decreased strength;Decreased mobility;Decreased range of motion;Decreased knowledge of precautions;Decreased activity tolerance;Pain;Decreased knowledge of use of DME       PT Treatment Interventions DME instruction;Therapeutic activities;Gait training;Therapeutic exercise;Patient/family education;Functional mobility training;Stair training    PT Goals (Current goals can be found in the Care Plan section)  Acute Rehab PT Goals Patient Stated Goal: to  walk with no pain PT Goal Formulation: With patient/family Time For Goal Achievement: 08/28/22 Potential to Achieve Goals: Good    Frequency 7X/week     Co-evaluation               AM-PAC PT "6 Clicks" Mobility  Outcome Measure Help needed turning from your back to your side while in a flat bed without using bedrails?: A Little Help needed moving from lying on your back to sitting on the side of a  flat bed without using bedrails?: A Little Help needed moving to and from a bed to a chair (including a wheelchair)?: A Little Help needed standing up from a chair using your arms (e.g., wheelchair or bedside chair)?: A Little Help needed to walk in hospital room?: A Little Help needed climbing 3-5 steps with a railing? : A Lot 6 Click Score: 17    End of Session Equipment Utilized During Treatment: Gait belt;Left knee immobilizer Activity Tolerance: Patient tolerated treatment well Patient left: in chair;with chair alarm set;with call bell/phone within reach;with family/visitor present;with nursing/sitter in room Nurse Communication: Mobility status PT Visit Diagnosis: Pain;Difficulty in walking, not elsewhere classified (R26.2)    Time: 4098-1191 PT Time Calculation (min) (ACUTE ONLY): 42 min   Charges:   PT Evaluation $PT Eval Low Complexity: 1 Low PT Treatments $Gait Training: 8-22 mins $Therapeutic Activity: 8-22 mins        Blanchard Kelch PT Acute Rehabilitation Services Office [phone] Weekend [phone]   Rada Hay 08/21/2022, 4:35 PM

## 2022-08-21 NOTE — Op Note (Signed)
Operative Note  Date of operation: 08/21/2022 Preoperative diagnosis: Left knee primary osteoarthritis Postoperative diagnosis: Same  Procedure: Left cemented total knee arthroplasty  Implants: Biomet/Zimmer persona cemented knee system Implant Name Type Inv. Item Serial No. Manufacturer Lot No. LRB No. Used Action  CEMENT BONE R 1X40 - [id number] Cement CEMENT BONE R 1X40  ZIMMER [employer]ON(ORTH,TRAU,BIO,SG) [id number] Left 1 Implanted  CEMENT BONE R 1X40 - [id number] Cement CEMENT BONE R 1X40  ZIMMER [employer]ON(ORTH,TRAU,BIO,SG) [id number] Left 1 Implanted  STEM POLY PAT PLY 65M KNEE - [id number] Knees STEM POLY PAT PLY 65M KNEE  ZIMMER [employer]ON(ORTH,TRAU,BIO,SG) [id number] Left 1 Implanted  COMP MED POLY AS PERS S6-7 12 - [id number] Joint COMP MED POLY AS PERS S6-7 12  ZIMMER [employer]ON(ORTH,TRAU,BIO,SG) [id number] Left 1 Implanted  COMP TIB PS KNEE D 0D LT - [id number] Joint COMP TIB PS KNEE D 0D LT  ZIMMER [employer]ON(ORTH,TRAU,BIO,SG) [id number] Left 1 Implanted  FEMUR CMT CR STD SZ 6 LT KNEE - [id number] Joint FEMUR CMT CR STD SZ 6 LT KNEE  ZIMMER [employer]ON(ORTH,TRAU,BIO,SG) [id number] Left 1 Implanted   Surgeon: Vanita Panda. Magnus Ivan, MD Assistant: Rexene Edison, PA-C  Anesthesia: #1 left lower extremity adductor canal block, #2 spinal, or 3 local Tourniquet time: Under 1 hour EBL: Less than 100 cc Antibiotics: IV Ancef Complications: None  Indications: The patient is a 67 year old female with well-documented severe end-stage arthritis involving her left knee.  He has been getting worse for many years now and she has tried and failed all forms of conservative treatment including physical therapy, anti-inflammatories, steroid injections and hyaluronic acid injections in her left knee.  She does have a right total knee arthroplasty that was done over a year ago.  She now wish to proceed with her left knee being replaced given the failure conservative treatment and her daily pain with that left knee that is  detrimentally affecting her mobility, her quality of life and her activities day living.  With knee replacement surgery she understands that there is a risk of acute blood loss anemia, nerve or vessel injury, fracture, infection, DVT, implant failure and wound healing issues.  She understands her goals are hopefully decrease pain, improve mobility, and improve quality of life.  Procedure description: After informed consent was obtained and the appropriate left knee was marked, anesthesia obtained a left lower extremity adductor canal block in the holding room.  The patient was then brought to the operating room and set up on the operating table where spinal anesthesia was obtained.  She was then laid in supine position on the operating table and a Foley catheter was placed.  A nonsterile tourniquet is placed around her upper left thigh and the left thigh, knee, leg, ankle and foot were prepped and draped with DuraPrep and sterile drapes including a sterile stockinette.  A timeout was called and she was benefits correct patient correct left knee.  An Esmarch was then used to wrap out the leg and the tourniquet was inflated to 300 mm of pressure.  With the knee extended a midline incision was made over the patella and carried proximally distally.  Dissection was carried down the knee joint and a medial parapatellar arthrotomy is made finding a very large joint effusion and significant synovitis in the knee.  With the knee in a flexed position we found complete cartilage wear in the knee as well as loose bodies.  We removed osteophytes from all 3 compartments as well as remnants of the medial lateral meniscus and the ACL.  We  then used an extramedullary cutting guide for making her proximal tibia cut correction varus and valgus and a 7 degree slope.  We made this cut to take 2 mm off the low side.  We made that cut without difficulty and then used an intramedullary cutting guide for our femur based on the left knee  at 5 degrees externally rotated and a 10 mm distal femoral cut.  We made that cut without difficulty and brought the knee back down to full extension and there was full extension with a 10 mm extension block.  We then went back to the femur and put a femoral sizing guide based off the epicondylar axis.  Based off of this we chose a size 6 femur.  We put a 4-in-1 cutting block for size 6 femur and made her anterior and posterior cuts followed by her chamfer cuts.  We then went back to the tibia and chose a size D left tibial tray for coverage over the tibial plateau with setting the rotation off the tibial tubercle and the femur.  We made a drill hole and keel punch off of this.  We then trialed our size D left tibia followed by our size 6 left CR standard femur.  We placed a 10 mm left medial congruent fixed-bearing polyethylene insert trial and went up to a 12 mm insert because he was pleased with the range of motion and stability of that 12 mm insert.  We then made a patella cut and drilled 3 holes for size 32 mm patella button.  With all transportation the knee again we put her through several cycles of motion and we are pleased with range of motion and stability.  We then removed all instrumentation from the knee and irrigate the knee with normal saline solution.  We placed Marcaine with epinephrine around the arthrotomy.  The cement was then mixed in with the knee in a flexed position we cemented our Biomet Zimmer persona tibial tray for a left knee size D followed by cementing our size the left CR standard femur size 6.  We placed our 12 mm medial congruent left polythene insert and cemented our size 32 patella button.  We then held the knee fully extended and compressed while the cement hardened.  Once that it hardened we let the tourniquet down and hemostasis was obtained electrocautery.  The arthrotomy was then closed with interrupted #1 Vicryl suture followed by 0 Vicryl to close the deep tissue and 2-0  Vicryl close subcutaneous tissue.  The skin was closed with staples.  Well-padded sterile dressing was applied.  The patient was taken the recovery room in stable condition.  Rexene Edison, PA-C did assist during the entire case from beginning to end and his assistance was crucial medically necessary for soft tissue management and retraction, helping guide implant placement and a layered closure of the wound.

## 2022-08-21 NOTE — Transfer of Care (Signed)
Immediate Anesthesia Transfer of Care Note  Patient: Krystal Lang  Procedure(s) Performed: LEFT TOTAL KNEE ARTHROPLASTY (Left: Knee)  Patient Location: PACU  Anesthesia Type:MAC and Spinal  Level of Consciousness: oriented, drowsy, and patient cooperative  Airway & Oxygen Therapy: Patient Spontanous Breathing and Patient connected to face mask oxygen  Post-op Assessment: Report given to RN and Post -op Vital signs reviewed and stable  Post vital signs: Reviewed  Last Vitals:  Vitals Value Taken Time  BP    Temp    Pulse 60 08/21/22 1137  Resp 15 08/21/22 1137  SpO2 100 % 08/21/22 1137  Vitals shown include unvalidated device data.  Last Pain:  Vitals:   08/21/22 0855  TempSrc:   PainSc: 0-No pain         Complications: No notable events documented.

## 2022-08-21 NOTE — Plan of Care (Signed)
  Problem: Education: Goal: Knowledge of the prescribed therapeutic regimen will improve Outcome: Progressing   Problem: Pain Management: Goal: Pain level will decrease with appropriate interventions Outcome: Progressing   Problem: Activity: Goal: Risk for activity intolerance will decrease Outcome: Progressing   

## 2022-08-21 NOTE — Anesthesia Preprocedure Evaluation (Addendum)
Anesthesia Evaluation  Patient identified by MRN, date of birth, ID band Patient awake    Reviewed: Allergy & Precautions, NPO status , Patient's Chart, lab work & pertinent test results  Airway Mallampati: II  TM Distance: >3 FB Neck ROM: Full    Dental  (+) Teeth Intact, Dental Advisory Given   Pulmonary    breath sounds clear to auscultation       Cardiovascular  Rhythm:Regular Rate:Normal     Neuro/Psych  Headaches  Anxiety        GI/Hepatic ,,,(+) Hepatitis -, A  Endo/Other  diabetes, Type 2, Oral Hypoglycemic AgentsHypothyroidism    Renal/GU      Musculoskeletal  (+) Arthritis ,    Abdominal   Peds  Hematology negative hematology ROS (+)   Anesthesia Other Findings   Reproductive/Obstetrics                              Anesthesia Physical Anesthesia Plan  ASA: 2  Anesthesia Plan: Spinal   Post-op Pain Management: Regional block*   Induction: Intravenous  PONV Risk Score and Plan: 3 and Ondansetron, Dexamethasone, Propofol infusion and Treatment may vary due to age or medical condition  Airway Management Planned: Natural Airway and Simple Face Mask  Additional Equipment: None  Intra-op Plan:   Post-operative Plan:   Informed Consent: I have reviewed the patients History and Physical, chart, labs and discussed the procedure including the risks, benefits and alternatives for the proposed anesthesia with the patient or authorized representative who has indicated his/her understanding and acceptance.     Dental advisory given  Plan Discussed with: CRNA  Anesthesia Plan Comments: (Lab Results      Component                Value               Date                      WBC                      6.3                 08/15/2022                HGB                      12.9                08/15/2022                HCT                      42.7                08/15/2022                 MCV                      93.4                08/15/2022                PLT                      283  08/15/2022           )        Anesthesia Quick Evaluation

## 2022-08-22 DIAGNOSIS — M1712 Unilateral primary osteoarthritis, left knee: Secondary | ICD-10-CM | POA: Diagnosis not present

## 2022-08-22 LAB — BASIC METABOLIC PANEL
Anion gap: 0 — ABNORMAL LOW (ref 5–15)
BUN: 18 mg/dL (ref 8–23)
CO2: 22 mmol/L (ref 22–32)
Calcium: 8.5 mg/dL — ABNORMAL LOW (ref 8.9–10.3)
Chloride: 112 mmol/L — ABNORMAL HIGH (ref 98–111)
Creatinine, Ser: 0.97 mg/dL (ref 0.44–1.00)
GFR, Estimated: >60 mL/min (ref >60)
Glucose, Bld: 70 mg/dL (ref 70–99)
Potassium: 4.1 mmol/L (ref 3.5–5.1)
Sodium: 134 mmol/L — ABNORMAL LOW (ref 135–145)

## 2022-08-22 LAB — CBC
HCT: 36.4 % (ref 36.0–46.0)
Hemoglobin: 10.9 g/dL — ABNORMAL LOW (ref 12.0–15.0)
MCH: 28.4 pg (ref 26.0–34.0)
MCHC: 29.9 g/dL — ABNORMAL LOW (ref 30.0–36.0)
MCV: 94.8 fL (ref 80.0–100.0)
Platelets: 251 10*3/uL (ref 150–400)
RBC: 3.84 MIL/uL — ABNORMAL LOW (ref 3.87–5.11)
RDW: 13.9 % (ref 11.5–15.5)
WBC: 11.3 10*3/uL — ABNORMAL HIGH (ref 4.0–10.5)
nRBC: 0 % (ref 0.0–0.2)

## 2022-08-22 LAB — GLUCOSE, CAPILLARY
Glucose-Capillary: 89 mg/dL (ref 70–99)
Glucose-Capillary: 178 mg/dL — ABNORMAL HIGH (ref 70–99)

## 2022-08-22 MED ORDER — METHOCARBAMOL 500 MG PO TABS: 500.0000 mg | ORAL_TABLET | Freq: Four times a day (QID) | ORAL | 0 refills | Status: DC | PRN

## 2022-08-22 MED ORDER — OXYCODONE HCL 5 MG PO TABS: 5.0000 mg | ORAL_TABLET | ORAL | 0 refills | Status: DC | PRN

## 2022-08-22 MED ORDER — AMOXICILLIN 500 MG PO TABS: 500.0000 mg | ORAL_TABLET | Freq: Two times a day (BID) | ORAL | 0 refills | Status: DC

## 2022-08-22 MED ORDER — ZOLPIDEM TARTRATE 5 MG PO TABS: 5.0000 mg | ORAL_TABLET | Freq: Every evening | ORAL | 0 refills | Status: DC | PRN

## 2022-08-22 MED ORDER — KETOROLAC TROMETHAMINE 15 MG/ML IJ SOLN
7.5000 mg | Freq: Four times a day (QID) | INTRAMUSCULAR | Status: DC
  Administered 2022-08-22: 7.5 mg via INTRAVENOUS
  Filled 2022-08-22 (×2): qty 1

## 2022-08-22 MED ORDER — ASPIRIN 81 MG PO CHEW: 81.0000 mg | CHEWABLE_TABLET | Freq: Two times a day (BID) | ORAL | 0 refills | Status: DC

## 2022-08-22 NOTE — Plan of Care (Signed)
  Problem: Education: Goal: Knowledge of the prescribed therapeutic regimen will improve Outcome: Progressing Goal: Individualized Educational Video(s) Outcome: Completed/Met   Problem: Activity: Goal: Ability to avoid complications of mobility impairment will improve Outcome: Progressing Goal: Range of joint motion will improve Outcome: Progressing   Problem: Clinical Measurements: Goal: Postoperative complications will be avoided or minimized Outcome: Progressing   Problem: Pain Management: Goal: Pain level will decrease with appropriate interventions Outcome: Progressing   Problem: Skin Integrity: Goal: Will show signs of wound healing Outcome: Progressing   Problem: Education: Goal: Knowledge of General Education information will improve Description: Including pain rating scale, medication(s)/side effects and non-pharmacologic comfort measures Outcome: Progressing   Problem: Health Behavior/Discharge Planning: Goal: Ability to manage health-related needs will improve Outcome: Progressing   Problem: Clinical Measurements: Goal: Ability to maintain clinical measurements within normal limits will improve Outcome: Progressing Goal: Will remain free from infection Outcome: Progressing Goal: Diagnostic test results will improve Outcome: Progressing Goal: Respiratory complications will improve Outcome: Progressing Goal: Cardiovascular complication will be avoided Outcome: Progressing

## 2022-08-22 NOTE — TOC Transition Note (Signed)
Transition of Care [facility]) - CM/SW Discharge Note   Patient Details  Name: Krystal Lang MRN: [id number] Date of Birth: 04/16/1955  [facility] CM/SW Contact:  Amada Jupiter, LCSW Phone Number: 08/22/2022, 9:40 AM   Clinical Narrative:     Met with pt and confirming she has needed DME at home.  HHPT prearranged with [facility].  No further TOC needs.  Final next level of care: Home w Home Health Services Barriers to Discharge: No Barriers Identified   Patient Goals and CMS Choice      Discharge Placement                         Discharge Plan and Services Additional resources added to the After Visit Summary for                  DME Arranged: N/A DME Agency: NA       HH Arranged: PT HH Agency: [facility]        Social Determinants of Health (SDOH) Interventions SDOH Screenings   Housing: Patient Declined (08/21/2022)  Tobacco Use: Low Risk  (08/21/2022)     Readmission Risk Interventions     No data to display

## 2022-08-22 NOTE — Progress Notes (Signed)
Patient discharged to home w/ family. Given all belongings, instructions. Verbalized understanding of instructions. Escorted to pov via w/c. 

## 2022-08-22 NOTE — Discharge Summary (Signed)
Patient ID: Krystal Lang MRN: [id number] DOB/AGE: 02-Sep-1955 67 y.o.  Admit date: 08/21/2022 Discharge date: 08/22/2022  Admission Diagnoses:  Principal Problem:   Unilateral primary osteoarthritis, left knee Active Problems:   Status post total left knee replacement   Discharge Diagnoses:  Same  Past Medical History:  Diagnosis Date   Anxiety    Arthritis    Cataract    Mixed form OU   Complication of anesthesia    after knee arthroscopy pateint difficult to wake up   Diabetes mellitus    Fibroid uterus 2001   H/O fatigue 1998   Headache(784.0)    denies   Hepatitis    A, 30 years ago   Hirsutism 1998   Idiopathic   History of chicken pox    Hypothyroidism    Menses, irregular 1998    Surgeries: Procedure(s): LEFT TOTAL KNEE ARTHROPLASTY on 08/21/2022   Consultants:   Discharged Condition: Improved  Hospital Course: Krystal Lang is an 67 y.o. female who was admitted 08/21/2022 for operative treatment ofUnilateral primary osteoarthritis, left knee. Patient has severe unremitting pain that affects sleep, daily activities, and work/hobbies. After pre-op clearance the patient was taken to the operating room on 08/21/2022 and underwent  Procedure(s): LEFT TOTAL KNEE ARTHROPLASTY.    Patient was given perioperative antibiotics:  Anti-infectives (From admission, onward)    Start     Dose/Rate Route Frequency Ordered Stop   08/22/22 0000  amoxicillin (AMOXIL) 500 MG tablet        500 mg Oral 2 times daily 08/22/22 1305     08/21/22 1600  ceFAZolin (ANCEF) IVPB 1 g/50 mL premix        1 g 100 mL/hr over 30 Minutes Intravenous Every 6 hours 08/21/22 1340 08/21/22 2224   08/21/22 0730  ceFAZolin (ANCEF) IVPB 2g/100 mL premix        2 g 200 mL/hr over 30 Minutes Intravenous On call to O.R. 08/21/22 4098 08/21/22 0947        Patient was given sequential compression devices, early ambulation, and chemoprophylaxis to prevent DVT.  Patient benefited maximally  from hospital stay and there were no complications.    Recent vital signs: Patient Vitals for the past 24 hrs:  BP Temp Temp src Pulse Resp SpO2  08/22/22 0950 138/63 98.1 F (36.7 C) -- 70 18 100 %  08/22/22 0649 (!) 147/56 97.7 F (36.5 C) -- 60 15 97 %  08/22/22 0206 (!) 125/57 98 F (36.7 C) -- 60 15 99 %  08/21/22 2230 (!) 151/50 97.6 F (36.4 C) Oral (!) 57 16 99 %     Recent laboratory studies:  Recent Labs    08/22/22 0342  WBC 11.3*  HGB 10.9*  HCT 36.4  PLT 251  NA 134*  K 4.1  CL 112*  CO2 22  BUN 18  CREATININE 0.97  GLUCOSE 70  CALCIUM 8.5*     Discharge Medications:   Allergies as of 08/22/2022   Not on File      Medication List     TAKE these medications    amoxicillin 500 MG tablet Commonly known as: AMOXIL Take 1 tablet (500 mg total) by mouth 2 (two) times daily.   aspirin 81 MG chewable tablet Chew 1 tablet (81 mg total) by mouth 2 (two) times daily.   atorvastatin 20 MG tablet Commonly known as: LIPITOR Take 20 mg by mouth 3 (three) times a week.   empagliflozin 10 MG Tabs tablet Commonly  known as: JARDIANCE Take 10 mg by mouth daily.   glimepiride 2 MG tablet Commonly known as: AMARYL Take 2 mg by mouth in the morning and at bedtime.   levothyroxine 100 MCG tablet Commonly known as: SYNTHROID Take 100 mcg by mouth daily before breakfast.   methocarbamol 500 MG tablet Commonly known as: ROBAXIN Take 1 tablet (500 mg total) by mouth every 6 (six) hours as needed for muscle spasms.   oxyCODONE 5 MG immediate release tablet Commonly known as: Oxy IR/ROXICODONE Take 1-2 tablets (5-10 mg total) by mouth every 4 (four) hours as needed for moderate pain (pain score 4-6).   sitaGLIPtin-metformin 50-1000 MG tablet Commonly known as: JANUMET Take 1 tablet by mouth 2 (two) times daily with a meal.   VITAMIN C PO Take 1 tablet by mouth daily.   Vitamin D (Ergocalciferol) 1.25 MG (50000 UNIT) Caps capsule Commonly known as:  DRISDOL Take 50,000 Units by mouth every 7 (seven) days.   zolpidem 5 MG tablet Commonly known as: AMBIEN Take 1 tablet (5 mg total) by mouth at bedtime as needed for sleep.               Durable Medical Equipment  (From admission, onward)           Start     Ordered   08/21/22 1341  DME 3 n 1  Once        08/21/22 1340   08/21/22 1341  DME Walker rolling  Once       Question Answer Comment  Walker: With 5 Inch Wheels   Patient needs a walker to treat with the following condition Status post total left knee replacement      08/21/22 1340            Diagnostic Studies: DG Knee Left Port  Result Date: 08/21/2022 CLINICAL DATA:  [id number] Status post total left knee replacement [id number] EXAM: PORTABLE LEFT KNEE - 1-2 VIEW COMPARISON:  Left knee MRI 02/22/2022 FINDINGS: Postsurgical changes of left total knee arthroplasty. Normal alignment. No evidence of loosening or periprosthetic fracture. Expected soft tissue changes. IMPRESSION: Postsurgical changes of left total knee arthroplasty. No evidence of immediate hardware complication. Electronically Signed   By: Caprice Renshaw M.D.   On: 08/21/2022 12:38    Disposition: Discharge disposition: 01-Home or Self Care          Follow-up Information     Kathryne Hitch, MD Follow up in 2 week(s).   Specialty: Orthopedic Surgery Contact information: [address] [place] [address] [phone]         [facility] Follow up.   Specialty: Home Health Services Why: to provide home physical therapy visits Contact information: [address] [place] [address] [phone]                  Signed: Kathryne Hitch 08/22/2022, 4:32 PM

## 2022-08-22 NOTE — Progress Notes (Signed)
Subjective: 1 Day Post-Op Procedure(s) (LRB): LEFT TOTAL KNEE ARTHROPLASTY (Left) Patient reports pain as moderate.    Objective: Vital signs in last 24 hours: Temp:  [97.6 F (36.4 C)-98.1 F (36.7 C)] 98.1 F (36.7 C) (05/24 0950) Pulse Rate:  [57-70] 70 (05/24 0950) Resp:  [14-18] 18 (05/24 0950) BP: (125-153)/(50-65) 138/63 (05/24 0950) SpO2:  [97 %-100 %] 100 % (05/24 0950) Weight:  [73.5 kg] 73.5 kg (05/23 1335)  Intake/Output from previous day: 05/23 0701 - 05/24 0700 In: 2174.5 [P.O.:60; I.V.:1864.5; IV Piggyback:250] Out: 1675 [Urine:1625; Blood:50] Intake/Output this shift: Total I/O In: 468.6 [P.O.:240; I.V.:228.6] Out: -   Recent Labs    08/22/22 0342  HGB 10.9*   Recent Labs    08/22/22 0342  WBC 11.3*  RBC 3.84*  HCT 36.4  PLT 251   Recent Labs    08/22/22 0342  NA 134*  K 4.1  CL 112*  CO2 22  BUN 18  CREATININE 0.97  GLUCOSE 70  CALCIUM 8.5*   No results for input(s): "LABPT", "INR" in the last 72 hours.  Sensation intact distally Intact pulses distally Dorsiflexion/Plantar flexion intact Incision: dressing C/D/I No cellulitis present Compartment soft   Assessment/Plan: 1 Day Post-Op Procedure(s) (LRB): LEFT TOTAL KNEE ARTHROPLASTY (Left) Up with therapy Discharge home with home health this afternoon.       Kathryne Hitch 08/22/2022, 1:03 PM

## 2022-08-22 NOTE — Progress Notes (Signed)
Physical Therapy Treatment Patient Details Name: Krystal Lang MRN: [id number] DOB: [birth year] Today's Date: 08/22/2022   History of Present Illness 67 yo female S/P LTKA 5/23/4. PMH: DM, anxiety, RTKA    PT Comments    POD # 1 pm session Daughter present for [employer].   Assisted with amb in hallway a functional distance, practiced stairs.  Then returned to room to perform some TE's following HEP handout.  Instructed on proper tech, freq as well as use of ICE.   Addressed all mobility questions, discussed appropriate activity, educated on use of ICE.  Pt ready for D/C to home.   Recommendations for follow up therapy are one component of a multi-disciplinary discharge planning process, led by the attending physician.  Recommendations may be updated based on patient status, additional functional criteria and insurance authorization.  Follow Up Recommendations       Assistance Recommended at Discharge Intermittent Supervision/Assistance  Patient can return home with the following A little help with walking and/or transfers;A little help with bathing/dressing/bathroom;Assistance with cooking/housework;Assist for transportation;Help with stairs or ramp for entrance   Equipment Recommendations  None recommended by PT    Recommendations for Other Services       Precautions / Restrictions Precautions Precautions: Knee;Fall Precaution Comments: no pillow under knee Restrictions Weight Bearing Restrictions: No Other Position/Activity Restrictions: WBAT     Mobility  Bed Mobility               General bed mobility comments: OOB in recliner    Transfers Overall transfer level: Needs assistance Equipment used: Rolling walker (2 wheels) Transfers: Sit to/from Stand Sit to Stand: Min assist, From elevated surface           General transfer comment: cues for hand and left leg position as well as safety with turns    Ambulation/Gait Ambulation/Gait assistance:  Min assist Gait Distance (Feet): 35 Feet Assistive device: Rolling walker (2 wheels) Gait Pattern/deviations: Step-to pattern, Antalgic Gait velocity: decreased     General Gait Details: with Daughter "hands on" using safety belt.  VC's on proper walker to self distance as well as safety with turns   Stairs Stairs: Yes Stairs assistance: Min guard, Min assist Stair Management: One rail Left, Step to pattern, Forwards, Sideways Number of Stairs: 4 General stair comments: with Daughter "hands on" using safety belt.   Wheelchair Mobility    Modified Rankin (Stroke Patients Only)       Balance                                            Cognition Arousal/Alertness: Awake/alert Behavior During Therapy: WFL for tasks assessed/performed Overall Cognitive Status: Within Functional Limits for tasks assessed                                 General Comments: AxO x 3 very pleasant        Exercises  Total Knee Replacement TE's following HEP handout 10 reps B LE ankle pumps 05 reps towel squeezes 05 reps knee presses 05 reps heel slides  05 reps SAQ's 05 reps SLR's 05 reps ABD Educated on use of gait belt to assist with TE's Followed by ICE     General Comments        Pertinent Vitals/Pain Pain Assessment Pain Assessment:  0-10 Pain Score: 6  Pain Location: L knee with activity Pain Descriptors / Indicators: Aching, Discomfort, Grimacing, Moaning, Operative site guarding Pain Intervention(s): Monitored during session, Premedicated before session, Repositioned, Ice applied    Home Living                          Prior Function            PT Goals (current goals can now be found in the care plan section) Progress towards PT goals: Progressing toward goals    Frequency    7X/week      PT Plan Current plan remains appropriate    Co-evaluation              AM-PAC PT "6 Clicks" Mobility   Outcome  Measure  Help needed turning from your back to your side while in a flat bed without using bedrails?: A Little Help needed moving from lying on your back to sitting on the side of a flat bed without using bedrails?: A Little Help needed moving to and from a bed to a chair (including a wheelchair)?: A Little Help needed standing up from a chair using your arms (e.g., wheelchair or bedside chair)?: A Little Help needed to walk in hospital room?: A Little Help needed climbing 3-5 steps with a railing? : A Little 6 Click Score: 18    End of Session Equipment Utilized During Treatment: Gait belt Activity Tolerance: Patient tolerated treatment well Patient left: in chair;with chair alarm set;with call bell/phone within reach;with family/visitor present Nurse Communication: Mobility status PT Visit Diagnosis: Pain;Difficulty in walking, not elsewhere classified (R26.2) Pain - Right/Left: Left     Time: 1610-9604 PT Time Calculation (min) (ACUTE ONLY): 28 min  Charges:  $Gait Training: 8-22 mins $Therapeutic Exercise: 8-22 mins                     Felecia Shelling  PTA Acute  Rehabilitation Services Office M-F          [phone]

## 2022-08-22 NOTE — Progress Notes (Signed)
Physical Therapy Treatment Patient Details Name: Krystal Lang MRN: [id number] DOB: 1956/01/25 Today's Date: 08/22/2022   History of Present Illness 67 yo female S/P LTKA 5/23/4. PMH: DM, anxiety, RTKA    PT Comments    POD # 1 am session Pt OOB in recliner.  Assisted with amb in hallway.  Practiced stairs.  Then returned to room to perform some TE's following HEP handout.  Instructed on proper tech, freq as well as use of ICE.   Will see pt again this afternoon to complete HEP Education and repeat stair training with family.   Recommendations for follow up therapy are one component of a multi-disciplinary discharge planning process, led by the attending physician.  Recommendations may be updated based on patient status, additional functional criteria and insurance authorization.  Follow Up Recommendations       Assistance Recommended at Discharge Intermittent Supervision/Assistance  Patient can return home with the following A little help with walking and/or transfers;A little help with bathing/dressing/bathroom;Assistance with cooking/housework;Assist for transportation;Help with stairs or ramp for entrance   Equipment Recommendations  None recommended by PT    Recommendations for Other Services       Precautions / Restrictions Precautions Precautions: Knee;Fall Precaution Comments: no pillow under knee Restrictions Weight Bearing Restrictions: No Other Position/Activity Restrictions: WBAT     Mobility  Bed Mobility               General bed mobility comments: OOB in recliner    Transfers Overall transfer level: Needs assistance Equipment used: Rolling walker (2 wheels) Transfers: Sit to/from Stand Sit to Stand: Min assist, From elevated surface           General transfer comment: cues for hand and left leg position as well as safety with turns    Ambulation/Gait Ambulation/Gait assistance: Min assist Gait Distance (Feet): 27 Feet Assistive  device: Rolling walker (2 wheels) Gait Pattern/deviations: Step-to pattern, Antalgic Gait velocity: decreased     General Gait Details: VC's on proper walker to self distance as well as safety with turns   Stairs Stairs: Yes Stairs assistance: Min guard, Min assist Stair Management: One rail Left, Step to pattern, Forwards, Sideways Number of Stairs: 4 General stair comments: VC's on proper sequencing as well as safety   Wheelchair Mobility    Modified Rankin (Stroke Patients Only)       Balance                                            Cognition Arousal/Alertness: Awake/alert Behavior During Therapy: WFL for tasks assessed/performed Overall Cognitive Status: Within Functional Limits for tasks assessed                                 General Comments: AxO x 3 very pleasant        Exercises  Total Knee Replacement TE's following HEP handout 10 reps B LE ankle pumps 05 reps towel squeezes 05 reps knee presses 05 reps heel slides   Educated on use of gait belt to assist with TE's Followed by ICE     General Comments        Pertinent Vitals/Pain Pain Assessment Pain Assessment: 0-10 Pain Score: 6  Pain Location: L knee with activity Pain Descriptors / Indicators: Aching, Discomfort, Grimacing, Moaning, Operative site guarding  Pain Intervention(s): Monitored during session, Premedicated before session, Repositioned, Ice applied    Home Living                          Prior Function            PT Goals (current goals can now be found in the care plan section) Progress towards PT goals: Progressing toward goals    Frequency    7X/week      PT Plan Current plan remains appropriate    Co-evaluation              AM-PAC PT "6 Clicks" Mobility   Outcome Measure  Help needed turning from your back to your side while in a flat bed without using bedrails?: A Little Help needed moving from lying on  your back to sitting on the side of a flat bed without using bedrails?: A Little Help needed moving to and from a bed to a chair (including a wheelchair)?: A Little Help needed standing up from a chair using your arms (e.g., wheelchair or bedside chair)?: A Little Help needed to walk in hospital room?: A Little Help needed climbing 3-5 steps with a railing? : A Little 6 Click Score: 18    End of Session Equipment Utilized During Treatment: Gait belt Activity Tolerance: Patient tolerated treatment well Patient left: in chair;with chair alarm set;with call bell/phone within reach;with family/visitor present Nurse Communication: Mobility status PT Visit Diagnosis: Pain;Difficulty in walking, not elsewhere classified (R26.2) Pain - Right/Left: Left     Time: 1610-9604 PT Time Calculation (min) (ACUTE ONLY): 45 min  Charges:  $Gait Training: 8-22 mins $Therapeutic Exercise: 8-22 mins $Therapeutic Activity: 8-22 mins                     {Livianna Petraglia  PTA Acute  [employer] M-F          [phone]

## 2022-08-23 ENCOUNTER — Ambulatory Visit (HOSPITAL_BASED_OUTPATIENT_CLINIC_OR_DEPARTMENT_OTHER): Payer: Medicare Other | Admitting: Physical Therapy

## 2022-08-27 ENCOUNTER — Encounter (HOSPITAL_COMMUNITY): Payer: Self-pay | Admitting: Orthopaedic Surgery

## 2022-08-27 ENCOUNTER — Telehealth: Payer: Self-pay

## 2022-08-27 NOTE — Telephone Encounter (Signed)
Patient Krystal Lang on triage phone asking to be worked in to see Dr Magnus Ivan due to increased pain, bruising and swelling in her leg s/p surgery last week. Please call patient and follow up with her. [phone]

## 2022-08-28 ENCOUNTER — Encounter (HOSPITAL_BASED_OUTPATIENT_CLINIC_OR_DEPARTMENT_OTHER): Payer: Self-pay | Admitting: Physical Therapy

## 2022-08-30 ENCOUNTER — Encounter: Payer: Self-pay | Admitting: Orthopaedic Surgery

## 2022-09-02 ENCOUNTER — Encounter (HOSPITAL_BASED_OUTPATIENT_CLINIC_OR_DEPARTMENT_OTHER): Payer: Self-pay | Admitting: Physical Therapy

## 2022-09-04 ENCOUNTER — Telehealth: Payer: Self-pay | Admitting: Orthopaedic Surgery

## 2022-09-04 ENCOUNTER — Encounter: Payer: Self-pay | Admitting: Orthopaedic Surgery

## 2022-09-04 ENCOUNTER — Other Ambulatory Visit: Payer: Self-pay | Admitting: Orthopaedic Surgery

## 2022-09-04 ENCOUNTER — Ambulatory Visit (INDEPENDENT_AMBULATORY_CARE_PROVIDER_SITE_OTHER): Payer: Medicare Other | Admitting: Orthopaedic Surgery

## 2022-09-04 DIAGNOSIS — Z96652 Presence of left artificial knee joint: Secondary | ICD-10-CM

## 2022-09-04 MED ORDER — LIDOCAINE 5 % EX PTCH: 1.0000 | MEDICATED_PATCH | CUTANEOUS | 0 refills | Status: DC

## 2022-09-04 MED ORDER — OXYCODONE HCL 5 MG PO TABS: 5.0000 mg | ORAL_TABLET | ORAL | 0 refills | Status: DC | PRN

## 2022-09-04 MED ORDER — ZOLPIDEM TARTRATE 5 MG PO TABS: 5.0000 mg | ORAL_TABLET | Freq: Every evening | ORAL | 0 refills | Status: DC | PRN

## 2022-09-04 MED ORDER — CELECOXIB 200 MG PO CAPS: 200.0000 mg | ORAL_CAPSULE | Freq: Two times a day (BID) | ORAL | 3 refills | Status: DC | PRN

## 2022-09-04 NOTE — Progress Notes (Signed)
Krystal Lang comes in today for first postoperative visit status post a left total knee arthroplasty.  She has good progress with physical therapy with flexion to almost 85 degrees passively.  She says her right knee is actually doing a little better now.  She is having a really hard time sleeping at night.  She reports significant low back pain that has been there since surgery as well even when she was in the hospital she let us know that.  She does need a refill on her Ambien as well as oxycodone.  She can stop her baby aspirin twice daily.  My plan is to send in Celebrex today as well for inflammation.  On exam her calf is soft.  She has almost full extension and flexion is about 80 degrees today.  The left knee incision looks good and the staples are been removed and Steri-Strips applied.  She has intact motor and sensory exam in her left foot.  She will transition to outpatient physical therapy soon and we will see her back in 4 weeks for repeat exam but no x-rays are needed.

## 2022-09-04 NOTE — Telephone Encounter (Signed)
She wanted me to double check with dr. Magnus Ivan, please advise

## 2022-09-04 NOTE — Telephone Encounter (Signed)
Called and r/s. Pt also asked if she can use a lidocaine patch on her back. I advised pt that this was ok

## 2022-09-04 NOTE — Telephone Encounter (Signed)
Pt advised.

## 2022-09-04 NOTE — Telephone Encounter (Signed)
Pt had an appt today and need a 4 wk post op. Please call pt >pt asked to go on 7/15 but need to be sooner appt Phone number is [phone]

## 2022-09-06 ENCOUNTER — Encounter (HOSPITAL_BASED_OUTPATIENT_CLINIC_OR_DEPARTMENT_OTHER): Payer: Self-pay | Admitting: Physical Therapy

## 2022-09-09 ENCOUNTER — Ambulatory Visit (HOSPITAL_BASED_OUTPATIENT_CLINIC_OR_DEPARTMENT_OTHER): Payer: Medicare Other | Attending: Orthopaedic Surgery | Admitting: Physical Therapy

## 2022-09-09 DIAGNOSIS — M25662 Stiffness of left knee, not elsewhere classified: Secondary | ICD-10-CM | POA: Diagnosis present

## 2022-09-09 DIAGNOSIS — R600 Localized edema: Secondary | ICD-10-CM | POA: Diagnosis present

## 2022-09-09 DIAGNOSIS — M25562 Pain in left knee: Secondary | ICD-10-CM | POA: Insufficient documentation

## 2022-09-09 DIAGNOSIS — G8929 Other chronic pain: Secondary | ICD-10-CM | POA: Diagnosis present

## 2022-09-09 DIAGNOSIS — Z96652 Presence of left artificial knee joint: Secondary | ICD-10-CM | POA: Insufficient documentation

## 2022-09-09 DIAGNOSIS — R262 Difficulty in walking, not elsewhere classified: Secondary | ICD-10-CM | POA: Diagnosis present

## 2022-09-09 DIAGNOSIS — R42 Dizziness and giddiness: Secondary | ICD-10-CM | POA: Diagnosis present

## 2022-09-09 DIAGNOSIS — M6281 Muscle weakness (generalized): Secondary | ICD-10-CM

## 2022-09-09 NOTE — Therapy (Signed)
OUTPATIENT PHYSICAL THERAPY LOWER EXTREMITY EVALUATION   Patient Name: Krystal Lang MRN: [id number] DOB:1955/12/07, 67 y.o., female Today's Date: 09/10/2022  END OF SESSION:  PT End of Session - 09/10/22 0848     Visit Number 1    Number of Visits 24    Date for PT Re-Evaluation 11/05/22    Authorization Type MEDICARE PART A AND B    PT Start Time 1300    PT Stop Time 1343    PT Time Calculation (min) 43 min    Activity Tolerance Patient tolerated treatment well    Behavior During Therapy WFL for tasks assessed/performed             Past Medical History:  Diagnosis Date   Anxiety    Arthritis    Cataract    Mixed form OU   Complication of anesthesia    after knee arthroscopy pateint difficult to wake up   Diabetes mellitus    Fibroid uterus 2001   H/O fatigue 1998   Headache(784.0)    denies   Hepatitis    A, 30 years ago   Hirsutism 1998   Idiopathic   History of chicken pox    Hypothyroidism    Menses, irregular 1998   Past Surgical History:  Procedure Laterality Date   COLONOSCOPY  02/2021   DILATION AND CURETTAGE OF UTERUS  03/31/2000   In [place]   KNEE ARTHROPLASTY Right    Replaced in [place], [place]   KNEE ARTHROSCOPY Right 2008   TONSILLECTOMY     As a child   TOTAL KNEE ARTHROPLASTY Left 08/21/2022   Procedure: LEFT TOTAL KNEE ARTHROPLASTY;  Surgeon: Kathryne Hitch, MD;  Location: [facility];  Service: Orthopedics;  Laterality: Left;   Patient Active Problem List   Diagnosis Date Noted   Status post total left knee replacement 08/21/2022   Dyspareunia, female 11/13/2011   Type 2 diabetes mellitus (HCC) 11/13/2011   Alopecia 11/13/2011   History of chicken pox    Headache(784.0)    Hypothyroidism    Menses, irregular    Hirsutism    H/O fatigue    Fibroid uterus     PCP: Dr Orpah Cobb   REFERRING PROVIDER: Dr Doneen Poisson   REFERRING DIAG:  Diagnosis  [phone] (ICD-10-CM) - Status post left knee replacement     THERAPY DIAG:  Difficulty in walking, not elsewhere classified  Chronic pain of left knee  Stiffness of left knee, not elsewhere classified  Muscle weakness (generalized)  Localized edema  Rationale for Evaluation and Treatment: Rehabilitation  ONSET DATE: 08/21/2022  SUBJECTIVE:   SUBJECTIVE STATEMENT: Patient had left knee replacement on 08/21/2022.  She has been receiving home health since that point.  She is currently walking with a walker.  Her chief complaint is swelling of the knee.  The pain in the knee is well-controlled.  Since the surgery she has had significant lower back pain.  She has no prior history of lower back pain.  She reports she cannot sit for too long without having to stand up and move.  In the clinic she was unable first to sit for more than 5 to 10 minutes without having to stand up and move.  She has tried patches, soft tissue mobilization, and muscle relaxers.  She has had no relief in her back.  PERTINENT HISTORY: Right TKA, diabetes, anxiety, PAIN:  Are you having pain? Yes: NPRS scale: 6/10 Pain location: left knee  Pain description: aching  Aggravating  factors: Standing and walking, certain positionins  Relieving factors:   Yes: NPRS scale: 10/10 Pain location: left side low back  Pain description: aching  Aggravating factors: sitting, sleeping  Relieving factors: standing   PRECAUTIONS: None  WEIGHT BEARING RESTRICTIONS: Yes WBAT   FALLS:  Has patient fallen in last 6 months? No  LIVING ENVIRONMENT: 4 steps into her house  OCCUPATION:   PLOF: Independent  PATIENT GOALS:   To have less pain in her knee and her back   NEXT MD VISIT:   OBJECTIVE:   DIAGNOSTIC FINDINGS: Nothing post-op   PATIENT SURVEYS:  FOTO    COGNITION: Overall cognitive status: Within functional limits for tasks assessed     SENSATION: WFL  EDEMA:   MUSCLE LENGTH:  POSTURE: No Significant postural limitations  PALPATION: Significant spasm  in the left upper gluteal.  No significant spasm noted in left lower lumbar paraspinals.  LOWER EXTREMITY ROM:  Passive ROM Right eval Left eval  Hip flexion    Hip extension    Hip abduction    Hip adduction    Hip internal rotation    Hip external rotation    Knee flexion  87  Knee extension  -5  Ankle dorsiflexion    Ankle plantarflexion    Ankle inversion    Ankle eversion     (Blank rows = not tested)  LOWER EXTREMITY MMT:  MMT Right eval Left eval  Hip flexion    Hip extension    Hip abduction    Hip adduction    Hip internal rotation    Hip external rotation    Knee flexion    Knee extension    Ankle dorsiflexion    Ankle plantarflexion    Ankle inversion    Ankle eversion     (Blank rows = not tested) not tested secondary to recent surgery and significant lower back pain    GAIT: Walking with stiff leg.  Knee not unlocking during heel strike to toe off phase.  Mild side-to-side movement.  Mild forward flexion leaning onto walker.  Therapy advised patient to try to walk more in the walker and upright to take stress off her back.  TODAY'S TREATMENT:                                                                                                                              DATE:   Manual: Trigger point release to upper left gluteal.  Significant spasming noted.  Attempted long axis distraction from just above the knee.  Patient will reported some relief.  Grade 1 and 2 oscillations for pain relief.   PATIENT EDUCATION:  Education details: HEP, symptom management, Person educated: Patient Education method: Explanation, Demonstration, Tactile cues, Verbal cues, and Handouts Education comprehension: verbalized understanding, returned demonstration, verbal cues required, tactile cues required, and needs further education  HOME EXERCISE PROGRAM: Access Code: [email or web address] URL: [email or web address] Date: 09/10/2022 Prepared by: Lorayne Bender  Exercises - Supine Heel Slide with Strap  - 2-3 x daily - 7 x weekly - 3 sets - 10 reps - Supine Knee Extension Stretch on Towel Roll  - 2-3 x daily - 7 x weekly - 3 sets - 10 reps - Supine Quad Set  - 1 x daily - 7 x weekly - 3 sets - 10 reps - Seated Bilateral Shoulder Flexion Towel Slide at Table Top  - 1 x daily - 7 x weekly - 3 sets - 10 reps - Seated Hamstring Stretch  - 1 x daily - 7 x weekly - 3 sets - 10 reps - Theracane Over Shoulder  - 1 x daily - 7 x weekly - 3 sets - 10 reps  ASSESSMENT:  CLINICAL IMPRESSION: Patient is a 67 year old female who presents 6 status post left total knee replacement on 08/22/2022.  At this time her knee presents with expected limitations in range of motion, strength, and general function.  She is most limited at this time by significant lower back pain.  She has a significant muscle spasm in her upper gluteal.  She reported minor relief after manual therapy and was able to stand straighter.  She was given the light stretches to perform at home to try to improve her low back pain.  She would benefit from skilled therapy to improve ability to perform daily tasks, improve gait technique and endurance, and decreased lower back pain to improve her ability to perform daily tasks. OBJECTIVE IMPAIRMENTS: Abnormal gait, decreased activity tolerance, decreased knowledge of condition, decreased knowledge of use of DME, decreased mobility, difficulty walking, decreased ROM, decreased strength, and pain.   ACTIVITY LIMITATIONS: carrying, lifting, bending, sitting, squatting, stairs, transfers, and locomotion level  PARTICIPATION LIMITATIONS: meal prep, cleaning, laundry, driving, shopping, community activity, and yard work  PERSONAL FACTORS: 1-2 comorbidities: Right TKA, significant lower back pain  are also affecting patient's functional outcome.   REHAB POTENTIAL: Good  CLINICAL DECISION MAKING: Evolving/moderate complexity significant lower back pain  limiting patient's function  EVALUATION COMPLEXITY: Moderate   GOALS: Goals reviewed with patient? Yes  SHORT TERM GOALS: Target date: 10/08/2022   Patient will increase passive left knee flexion to 120 degrees Baseline: Goal status: INITIAL  2.  Patient will demonstrate full active left knee extension Baseline:  Goal status: INITIAL  3.  Patient will progress to least restrictive assistive device with ambulation distance of 300 feet without significant pain and with good safety Baseline:  Goal status: INITIAL  4.  Patient will be independent with baseline HEP Baseline:  Goal status: INITIAL  5.  Patient will report a 75% reduction in low back pain on the left side Baseline:  Goal status: INITIAL   LONG TERM GOALS: Target date: 11/05/2022    Patient will go up and down 8 steps with reciprocal gait pattern in order to improve community ambulation Baseline:  Goal status: INITIAL  2.  Patient will ambulate 3000 feet in the community without increased pain with least restrictive assistive device in order to go shopping Baseline:  Goal status: INITIAL  3.  Patient will be independent with complete exercise program to promote general health and bilateral knee strength Baseline:  Goal status: INITIAL    PLAN:  PT FREQUENCY: 3x/week patient will like to do 3 times a week until her low back improves  PT DURATION: 8 weeks  PLANNED INTERVENTIONS: Therapeutic exercises, Therapeutic activity, Neuromuscular re-education, Balance training, Gait training, Patient/Family education, Self Care, Joint mobilization, Stair training, DME  instructions, Aquatic Therapy, Dry Needling, Spinal mobilization, Cryotherapy, Moist heat, Taping, Ultrasound, and Manual therapy  PLAN FOR NEXT SESSION: Continue with manual therapy to lower back to reduce pain.  Consider needling when she is outside the surgical window for needling.  Progress to normal TKA activities as back allows.  Review cane  training as quad muscle firing allows.   Dessie Coma, PT 09/10/2022, 8:50 AM

## 2022-09-10 ENCOUNTER — Encounter (HOSPITAL_BASED_OUTPATIENT_CLINIC_OR_DEPARTMENT_OTHER): Payer: Self-pay | Admitting: Physical Therapy

## 2022-09-11 ENCOUNTER — Encounter (HOSPITAL_BASED_OUTPATIENT_CLINIC_OR_DEPARTMENT_OTHER): Payer: Self-pay | Admitting: Physical Therapy

## 2022-09-12 ENCOUNTER — Telehealth: Payer: Self-pay

## 2022-09-12 ENCOUNTER — Other Ambulatory Visit: Payer: Self-pay | Admitting: Orthopaedic Surgery

## 2022-09-12 NOTE — Telephone Encounter (Signed)
I called and talked to the pt. She wanted to be seen sooner. Says this knee hurts more than her other did and doesn't feel its normal. Please advise

## 2022-09-12 NOTE — Therapy (Signed)
OUTPATIENT PHYSICAL THERAPY LOWER EXTREMITY EVALUATION   Patient Name: Krystal Lang MRN: [id number] DOB:06/16/1955, 67 y.o., female Today's Date: 09/13/2022  END OF SESSION:  PT End of Session - 09/13/22 1504     Visit Number 2    Number of Visits 24    Date for PT Re-Evaluation 11/05/22    Authorization Type MEDICARE PART A AND B    PT Start Time 1500    PT Stop Time 1548    PT Time Calculation (min) 48 min    Activity Tolerance Patient tolerated treatment well    Behavior During Therapy WFL for tasks assessed/performed              Past Medical History:  Diagnosis Date   Anxiety    Arthritis    Cataract    Mixed form OU   Complication of anesthesia    after knee arthroscopy pateint difficult to wake up   Diabetes mellitus    Fibroid uterus 2001   H/O fatigue 1998   Headache(784.0)    denies   Hepatitis    A, 30 years ago   Hirsutism 1998   Idiopathic   History of chicken pox    Hypothyroidism    Menses, irregular 1998   Past Surgical History:  Procedure Laterality Date   COLONOSCOPY  02/2021   DILATION AND CURETTAGE OF UTERUS  03/31/2000   In [place]   KNEE ARTHROPLASTY Right    Replaced in [place], [place]   KNEE ARTHROSCOPY Right 2008   TONSILLECTOMY     As a child   TOTAL KNEE ARTHROPLASTY Left 08/21/2022   Procedure: LEFT TOTAL KNEE ARTHROPLASTY;  Surgeon: Kathryne Hitch, MD;  Location: [facility];  Service: Orthopedics;  Laterality: Left;   Patient Active Problem List   Diagnosis Date Noted   Status post total left knee replacement 08/21/2022   Dyspareunia, female 11/13/2011   Type 2 diabetes mellitus (HCC) 11/13/2011   Alopecia 11/13/2011   History of chicken pox    Headache(784.0)    Hypothyroidism    Menses, irregular    Hirsutism    H/O fatigue    Fibroid uterus     PCP: Dr Orpah Cobb   REFERRING PROVIDER: Dr Doneen Poisson   REFERRING DIAG:  Diagnosis  [phone] (ICD-10-CM) - Status post left knee replacement     THERAPY DIAG:  Difficulty in walking, not elsewhere classified  Muscle weakness (generalized)  Localized edema  Acute pain of left knee  Rationale for Evaluation and Treatment: Rehabilitation  ONSET DATE: 08/21/2022  SUBJECTIVE:   SUBJECTIVE STATEMENT: I am afraid of it buckling. Still walking with the walker because I don't want to fall.   PERTINENT HISTORY: Right TKA, diabetes, anxiety, PAIN:  Are you having pain? Yes: NPRS scale: 5/10 Pain location: left knee  Pain description: aching  Aggravating factors: Standing and walking, certain positionins  Relieving factors:    PRECAUTIONS: None  WEIGHT BEARING RESTRICTIONS: Yes WBAT   FALLS:  Has patient fallen in last 6 months? No  LIVING ENVIRONMENT: 4 steps into her house  OCCUPATION:   PLOF: Independent  PATIENT GOALS:   To have less pain in her knee and her back   OBJECTIVE:   DIAGNOSTIC FINDINGS: Nothing post-op   PATIENT SURVEYS:  6/15: LEFS 28  PALPATION: 6/15: edema around Lt knee compared to Rt  LOWER EXTREMITY ROM:  Passive ROM Left eval Left 6/15  Hip flexion    Hip extension    Hip abduction  Hip adduction    Hip internal rotation    Hip external rotation    Knee flexion 87 87  Knee extension -5 0  Ankle dorsiflexion    Ankle plantarflexion    Ankle inversion    Ankle eversion     (Blank rows = not tested)  LOWER EXTREMITY MMT:  MMT (lb) Right 6/15 Left 6/15  Hip flexion    Hip extension    Hip abduction    Hip adduction    Hip internal rotation    Hip external rotation    Knee flexion 25.5 18.7  Knee extension 33.5 13.7   (Blank rows = not tested) not tested secondary to recent surgery and significant lower back pain    GAIT: Eval: Walking with stiff leg.  Knee not unlocking during heel strike to toe off phase.  Mild side-to-side movement.  Mild forward flexion leaning onto walker.  Therapy advised patient to try to walk more in the walker and upright to take  stress off her back.  TODAY'S TREATMENT:                                                                                                                               Treatment                            6/15:  Nu step 9 min Heel slides with strap- hold 5 breaths x5 SAQ x10 SLR x10 Sidelying hip abduction x10, x20 Standing weight shift lateral and in wide tandem Toe off for flexion in swing through   PATIENT EDUCATION:  Education details: Anatomy of condition, POC, HEP, exercise form/rationale Person educated: Patient Education method: Explanation, Demonstration, Tactile cues, Verbal cues, and Handouts Education comprehension: verbalized understanding, returned demonstration, verbal cues required, tactile cues required, and needs further education  HOME EXERCISE PROGRAM: Access Code: [email or web address] URL: [email or web address]   ASSESSMENT:  CLINICAL IMPRESSION: Pt is nervous about buckling so I asked her to continue using walker for safety and to focus on adding knee flexion in swing through. Able to perform SLR with very minimal quad lag with cuing.    OBJECTIVE IMPAIRMENTS: Abnormal gait, decreased activity tolerance, decreased knowledge of condition, decreased knowledge of use of DME, decreased mobility, difficulty walking, decreased ROM, decreased strength, and pain.   ACTIVITY LIMITATIONS: carrying, lifting, bending, sitting, squatting, stairs, transfers, and locomotion level  PARTICIPATION LIMITATIONS: meal prep, cleaning, laundry, driving, shopping, community activity, and yard work  PERSONAL FACTORS: 1-2 comorbidities: Right TKA, significant lower back pain  are also affecting patient's functional outcome.   REHAB POTENTIAL: Good  CLINICAL DECISION MAKING: Evolving/moderate complexity significant lower back pain limiting patient's function  EVALUATION COMPLEXITY: Moderate   GOALS: Goals reviewed with patient? Yes  SHORT TERM GOALS: Target date:  10/08/2022   Patient will increase passive left knee flexion to 120 degrees Baseline: Goal status: INITIAL  2.  Patient will  demonstrate full active left knee extension Baseline:  Goal status: INITIAL  3.  Patient will progress to least restrictive assistive device with ambulation distance of 300 feet without significant pain and with good safety Baseline:  Goal status: INITIAL  4.  Patient will be independent with baseline HEP Baseline:  Goal status: INITIAL  5.  Patient will report a 75% reduction in low back pain on the left side Baseline:  Goal status: INITIAL   LONG TERM GOALS: Target date: 11/05/2022    Patient will go up and down 8 steps with reciprocal gait pattern in order to improve community ambulation Baseline:  Goal status: INITIAL  2.  Patient will ambulate 3000 feet in the community without increased pain with least restrictive assistive device in order to go shopping Baseline:  Goal status: INITIAL  3.  Patient will be independent with complete exercise program to promote general health and bilateral knee strength Baseline:  Goal status: INITIAL    PLAN:  PT FREQUENCY: 3x/week patient will like to do 3 times a week until her low back improves  PT DURATION: 8 weeks  PLANNED INTERVENTIONS: Therapeutic exercises, Therapeutic activity, Neuromuscular re-education, Balance training, Gait training, Patient/Family education, Self Care, Joint mobilization, Stair training, DME instructions, Aquatic Therapy, Dry Needling, Spinal mobilization, Cryotherapy, Moist heat, Taping, Ultrasound, and Manual therapy  PLAN FOR NEXT SESSION: Continue with manual therapy to lower back to reduce pain.  Consider needling when she is outside the surgical window for needling.  Progress to normal TKA activities as back allows.  Review cane training as quad muscle firing allows.   Tyaisha Cullom C. Million Maharaj PT, DPT 09/13/22 3:59 PM

## 2022-09-12 NOTE — Telephone Encounter (Signed)
Pt is s/p a left total knee replacement last month and states that she needs a refill of her sleeping pill. She is adamant that someone call her back and let her know that this will be done today. I advised that Cb is in surgery but I will replay the message to his assistant. She states that she needs a call back right away and wants to know if the on call doctor will refill the medication is the message is not responded to today.

## 2022-09-13 ENCOUNTER — Ambulatory Visit (HOSPITAL_BASED_OUTPATIENT_CLINIC_OR_DEPARTMENT_OTHER): Payer: Medicare Other | Admitting: Physical Therapy

## 2022-09-13 ENCOUNTER — Encounter (HOSPITAL_BASED_OUTPATIENT_CLINIC_OR_DEPARTMENT_OTHER): Payer: Self-pay | Admitting: Physical Therapy

## 2022-09-13 DIAGNOSIS — R262 Difficulty in walking, not elsewhere classified: Secondary | ICD-10-CM

## 2022-09-13 DIAGNOSIS — R600 Localized edema: Secondary | ICD-10-CM

## 2022-09-13 DIAGNOSIS — M25562 Pain in left knee: Secondary | ICD-10-CM

## 2022-09-13 DIAGNOSIS — M6281 Muscle weakness (generalized): Secondary | ICD-10-CM

## 2022-09-15 ENCOUNTER — Encounter: Payer: Self-pay | Admitting: Orthopaedic Surgery

## 2022-09-16 ENCOUNTER — Ambulatory Visit (HOSPITAL_BASED_OUTPATIENT_CLINIC_OR_DEPARTMENT_OTHER): Payer: Medicare Other | Admitting: Physical Therapy

## 2022-09-16 ENCOUNTER — Emergency Department (HOSPITAL_BASED_OUTPATIENT_CLINIC_OR_DEPARTMENT_OTHER)
Admission: EM | Admit: 2022-09-16 | Discharge: 2022-09-17 | Disposition: A | Discharge status: Home / Self Care | Payer: Medicare Other | Attending: Emergency Medicine | Admitting: Emergency Medicine

## 2022-09-16 ENCOUNTER — Other Ambulatory Visit (HOSPITAL_BASED_OUTPATIENT_CLINIC_OR_DEPARTMENT_OTHER): Payer: Self-pay

## 2022-09-16 ENCOUNTER — Emergency Department (HOSPITAL_BASED_OUTPATIENT_CLINIC_OR_DEPARTMENT_OTHER): Payer: Medicare Other

## 2022-09-16 ENCOUNTER — Emergency Department (HOSPITAL_COMMUNITY): Payer: Medicare Other

## 2022-09-16 ENCOUNTER — Emergency Department (HOSPITAL_BASED_OUTPATIENT_CLINIC_OR_DEPARTMENT_OTHER): Payer: Medicare Other | Admitting: Radiology

## 2022-09-16 ENCOUNTER — Encounter (HOSPITAL_BASED_OUTPATIENT_CLINIC_OR_DEPARTMENT_OTHER): Payer: Self-pay

## 2022-09-16 ENCOUNTER — Other Ambulatory Visit: Payer: Self-pay

## 2022-09-16 DIAGNOSIS — R112 Nausea with vomiting, unspecified: Secondary | ICD-10-CM | POA: Diagnosis not present

## 2022-09-16 DIAGNOSIS — E119 Type 2 diabetes mellitus without complications: Secondary | ICD-10-CM | POA: Diagnosis not present

## 2022-09-16 DIAGNOSIS — E039 Hypothyroidism, unspecified: Secondary | ICD-10-CM | POA: Diagnosis not present

## 2022-09-16 DIAGNOSIS — Z7984 Long term (current) use of oral hypoglycemic drugs: Secondary | ICD-10-CM | POA: Insufficient documentation

## 2022-09-16 DIAGNOSIS — R918 Other nonspecific abnormal finding of lung field: Secondary | ICD-10-CM | POA: Insufficient documentation

## 2022-09-16 DIAGNOSIS — Z7982 Long term (current) use of aspirin: Secondary | ICD-10-CM | POA: Diagnosis not present

## 2022-09-16 DIAGNOSIS — Z1152 Encounter for screening for COVID-19: Secondary | ICD-10-CM | POA: Diagnosis not present

## 2022-09-16 DIAGNOSIS — R42 Dizziness and giddiness: Secondary | ICD-10-CM | POA: Insufficient documentation

## 2022-09-16 LAB — COMPREHENSIVE METABOLIC PANEL
ALT: 13 U/L (ref 0–44)
AST: 20 U/L (ref 15–41)
Albumin: 4.7 g/dL (ref 3.5–5.0)
Alkaline Phosphatase: 66 U/L (ref 38–126)
Anion gap: 10 (ref 5–15)
BUN: 25 mg/dL — ABNORMAL HIGH (ref 8–23)
CO2: 24 mmol/L (ref 22–32)
Calcium: 10 mg/dL (ref 8.9–10.3)
Chloride: 103 mmol/L (ref 98–111)
Creatinine, Ser: 1.03 mg/dL — ABNORMAL HIGH (ref 0.44–1.00)
GFR, Estimated: 59 mL/min — ABNORMAL LOW (ref >60)
Glucose, Bld: 166 mg/dL — ABNORMAL HIGH (ref 70–99)
Potassium: 4.7 mmol/L (ref 3.5–5.1)
Sodium: 137 mmol/L (ref 135–145)
Total Bilirubin: 0.6 mg/dL (ref 0.3–1.2)
Total Protein: 7.7 g/dL (ref 6.5–8.1)

## 2022-09-16 LAB — URINALYSIS, ROUTINE W REFLEX MICROSCOPIC
Bacteria, UA: NONE SEEN
Bilirubin Urine: NEGATIVE
Glucose, UA: >1000 mg/dL — AB
Hgb urine dipstick: NEGATIVE
Ketones, ur: 15 mg/dL — AB
Nitrite: NEGATIVE
Protein, ur: NEGATIVE mg/dL
Specific Gravity, Urine: 1.02 (ref 1.005–1.030)
pH: 6.5 (ref 5.0–8.0)

## 2022-09-16 LAB — CBC
HCT: 41 % (ref 36.0–46.0)
Hemoglobin: 12.1 g/dL (ref 12.0–15.0)
MCH: 27.3 pg (ref 26.0–34.0)
MCHC: 29.5 g/dL — ABNORMAL LOW (ref 30.0–36.0)
MCV: 92.3 fL (ref 80.0–100.0)
Platelets: 314 10*3/uL (ref 150–400)
RBC: 4.44 MIL/uL (ref 3.87–5.11)
RDW: 15.4 % (ref 11.5–15.5)
WBC: 6.5 10*3/uL (ref 4.0–10.5)
nRBC: 0 % (ref 0.0–0.2)

## 2022-09-16 LAB — LIPASE, BLOOD: Lipase: 16 U/L (ref 11–51)

## 2022-09-16 LAB — CBG MONITORING, ED
Glucose-Capillary: 113 mg/dL — ABNORMAL HIGH (ref 70–99)
Glucose-Capillary: 217 mg/dL — ABNORMAL HIGH (ref 70–99)

## 2022-09-16 LAB — SARS CORONAVIRUS 2 BY RT PCR: SARS Coronavirus 2 by RT PCR: NEGATIVE

## 2022-09-16 LAB — TROPONIN I (HIGH SENSITIVITY): Troponin I (High Sensitivity): 4 ng/L (ref <18)

## 2022-09-16 MED ORDER — LEVOTHYROXINE SODIUM 100 MCG PO TABS
100.0000 ug | ORAL_TABLET | Freq: Every day | ORAL | Status: DC
  Administered 2022-09-17: 100 ug via ORAL
  Filled 2022-09-16: qty 1

## 2022-09-16 MED ORDER — ONDANSETRON 4 MG PO TBDP
4.0000 mg | ORAL_TABLET | Freq: Once | ORAL | Status: AC
  Administered 2022-09-16: 4 mg via ORAL
  Filled 2022-09-16: qty 1

## 2022-09-16 MED ORDER — LACTATED RINGERS IV BOLUS
1000.0000 mL | Freq: Once | INTRAVENOUS | Status: AC
  Administered 2022-09-16: 1000 mL via INTRAVENOUS

## 2022-09-16 MED ORDER — MECLIZINE HCL 25 MG PO TABS
25.0000 mg | ORAL_TABLET | Freq: Three times a day (TID) | ORAL | Status: DC
  Administered 2022-09-16 – 2022-09-17 (×2): 25 mg via ORAL
  Filled 2022-09-16 (×2): qty 1

## 2022-09-16 MED ORDER — ONDANSETRON HCL 4 MG/2ML IJ SOLN
4.0000 mg | Freq: Three times a day (TID) | INTRAMUSCULAR | Status: DC | PRN
  Administered 2022-09-17: 4 mg via INTRAVENOUS
  Filled 2022-09-16: qty 2

## 2022-09-16 MED ORDER — DIAZEPAM 5 MG/ML IJ SOLN
2.5000 mg | Freq: Once | INTRAMUSCULAR | Status: AC
  Administered 2022-09-16: 2.5 mg via INTRAVENOUS
  Filled 2022-09-16: qty 2

## 2022-09-16 MED ORDER — CELECOXIB 200 MG PO CAPS
200.0000 mg | ORAL_CAPSULE | Freq: Every day | ORAL | Status: DC
  Administered 2022-09-17: 200 mg via ORAL
  Filled 2022-09-16: qty 1

## 2022-09-16 MED ORDER — INSULIN ASPART 100 UNIT/ML IJ SOLN
0.0000 [IU] | Freq: Three times a day (TID) | INTRAMUSCULAR | Status: DC
  Administered 2022-09-17: 8 [IU] via SUBCUTANEOUS

## 2022-09-16 MED ORDER — LEVOTHYROXINE SODIUM 100 MCG PO TABS: 100.0000 ug | ORAL_TABLET | Freq: Every day | ORAL | Status: DC

## 2022-09-16 MED ORDER — MECLIZINE HCL 25 MG PO TABS
25.0000 mg | ORAL_TABLET | Freq: Once | ORAL | Status: AC
  Administered 2022-09-16: 25 mg via ORAL
  Filled 2022-09-16: qty 1

## 2022-09-16 MED ORDER — DEXTROSE 5 % IV BOLUS: 500.0000 mL | Freq: Once | INTRAVENOUS | Status: DC

## 2022-09-16 MED ORDER — LORAZEPAM 1 MG PO TABS: 0.5000 mg | ORAL_TABLET | ORAL | Status: DC | PRN

## 2022-09-16 MED ORDER — INSULIN ASPART 100 UNIT/ML IJ SOLN
0.0000 [IU] | Freq: Every day | INTRAMUSCULAR | Status: DC
  Administered 2022-09-16: 2 [IU] via SUBCUTANEOUS

## 2022-09-16 MED ORDER — IOHEXOL 350 MG/ML SOLN
100.0000 mL | Freq: Once | INTRAVENOUS | Status: AC | PRN
  Administered 2022-09-16: 75 mL via INTRAVENOUS

## 2022-09-16 NOTE — ED Triage Notes (Signed)
Patient BIB [facility] from Home.  Endorses N/V and Dizziness for 3 Days. Meclizine without relief due to N/V.   196/80 BP with EMS. All other VSS. 4 mg of IV Zofran without relief.   Vomiting during Triage. A&Ox4. GCS 15. BIB Stretcher/Wheelchair.

## 2022-09-16 NOTE — ED Notes (Signed)
Report received from [facility] patient sent over for MRI.  Patient received zofran with carelink.  NAD at this time.

## 2022-09-16 NOTE — ED Provider Notes (Signed)
     Patient transferred from [facility] ED by Dr. Karene Fry. Please see prior provider note for more detail.   Briefly: Patient is 67 y.o. "1 week of nausea and vomiting. The patient has had intermittent room spinning dizziness over the past week. She feels dehydrated. She tried meclizine at home without success and vomited up the medicine. She endorses some mild left-sided chest discomfort in the setting of her nausea and retching. She denies any sharp pleuritic pain. She denies any chest pressure. She denies any ripping or tearing chest pain. She denies any facial droop, focal numbness or weakness. She continues to endorse room spinning dizziness. She states that she normally does not have an elevated blood pressure. With EMS her blood pressure was 196/80. She was administered 4 mg of IV Zofran without significant relief. She has had trouble ambulating today due to her room spinning dizziness. 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 "   DDX: concern for vertigo, ACS, viral syndrome, labyrinthitis, vestibular neuronitis   MDM: Patient stating that she has not eaten since this morning. Provided patient with IV D5. Patient given Valium given claustrophobia associated with MRI. MRI without concern for stroke, but patient still with dizziness. Patient tolerated PO food without vomiting. Talked with patient's husband (Dr. Algie Coffer) who requests that patient stays in ED overnight for observation and for evaluation with PT/OT in the morning given her recent total knee replacement and new onset vertigo. Patient boarding overnight and dispo pending PT/OT evaluation tomorrow. Ordered patient's home medications. Patient currently afebrile with stable vitals.    Dorthy Cooler, [place] 09/16/22 2243    Virgina Norfolk, DO 09/16/22 2320

## 2022-09-16 NOTE — ED Notes (Signed)
Krystal Lang sending transport for [facility] ED to [facility] ED for MRI. Dr Eloise Harman is accepting.-ABB(NS)

## 2022-09-16 NOTE — ED Notes (Signed)
Pt aware for the need of a urine sample. Unable to currently provide one.

## 2022-09-16 NOTE — Discharge Instructions (Addendum)
Your CXR showed evidence of pulmonary nodules. A non-emergent CT is recommended. Your CT Scan of the head and neck was normal. Neurology recommended MRI of the brain to evaluate for stroke, which was not concerning for acute stroke.

## 2022-09-16 NOTE — Progress Notes (Signed)
TOC is aware of consult.  Following for PT recommendations.  Krystal Lang, MSW, LCSW-A Pronouns:  She/Her/Hers [facility] HealthTransitions of Care Clinical Social Worker Direct Number:  [phone] [email or web address]

## 2022-09-16 NOTE — ED Provider Notes (Signed)
[place] EMERGENCY DEPARTMENT AT [facility] Provider Note   CSN: [id number] Arrival date & time: 09/16/22  1135     History  Chief Complaint  Patient presents with   Vomiting    Krystal Lang is a 67 y.o. female.  HPI   67 year old female with medical history significant for hypothyroidism, diabetes mellitus, arthritis, anxiety who presents to the emergency department with 1 week of nausea and vomiting.  The patient has had intermittent room spinning dizziness over the past week.  She feels dehydrated.  She tried meclizine at home without success and vomited up the medicine.  She endorses some mild left-sided chest discomfort in the setting of her nausea and retching.  She denies any sharp pleuritic pain.  She denies any chest pressure.  She denies any ripping or tearing chest pain.  She denies any facial droop, focal numbness or weakness.  She continues to endorse room spinning dizziness.  She states that she normally does not have an elevated blood pressure.  With EMS her blood pressure was 196/80.  She was administered 4 mg of IV Zofran without significant relief.  She has had trouble ambulating today due to her room spinning dizziness.  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  Home Medications Prior to Admission medications   Medication Sig Start Date End Date Taking? Authorizing Provider  lidocaine (LIDODERM) 5 % Place 1 patch onto the skin daily. Remove & Discard patch within 12 hours or as directed by MD 09/04/22   Kathryne Hitch, MD  amoxicillin (AMOXIL) 500 MG tablet Take 1 tablet (500 mg total) by mouth 2 (two) times daily. 08/22/22   Kathryne Hitch, MD  Ascorbic Acid (VITAMIN C PO) Take 1 tablet by mouth daily.    [provider]  aspirin 81 MG chewable tablet Chew 1 tablet (81 mg total) by mouth 2 (two) times daily. 08/22/22   Kathryne Hitch, MD  atorvastatin  (LIPITOR) 20 MG tablet Take 20 mg by mouth 3 (three) times a week. 08/18/19   [provider]  celecoxib (CELEBREX) 200 MG capsule Take 1 capsule (200 mg total) by mouth 2 (two) times daily between meals as needed. 09/04/22   Kathryne Hitch, MD  empagliflozin (JARDIANCE) 10 MG TABS tablet Take 10 mg by mouth daily.    [provider]  glimepiride (AMARYL) 2 MG tablet Take 2 mg by mouth in the morning and at bedtime.    [provider]  levothyroxine (SYNTHROID) 100 MCG tablet Take 100 mcg by mouth daily before breakfast. 09/05/19   [provider]  methocarbamol (ROBAXIN) 500 MG tablet Take 1 tablet (500 mg total) by mouth every 6 (six) hours as needed for muscle spasms. 08/22/22   Kathryne Hitch, MD  oxyCODONE (OXY IR/ROXICODONE) 5 MG immediate release tablet Take 1-2 tablets (5-10 mg total) by mouth every 4 (four) hours as needed for moderate pain (pain score 4-6). 09/04/22   Kathryne Hitch, MD  sitaGLIPtin-metformin (JANUMET) 50-1000 MG tablet Take 1 tablet by mouth 2 (two) times daily with a meal.    [provider]  Vitamin D, Ergocalciferol, (DRISDOL) 1.25 MG (50000 UNIT) CAPS capsule Take 50,000 Units by mouth every 7 (seven) days.    [provider]  zolpidem (AMBIEN) 5 MG tablet TAKE 1 TABLET BY MOUTH AT BEDTIME AS NEEDED FOR SLEEP. 09/12/22   Kathryne Hitch, MD  Allergies    Patient has no known allergies.    Review of Systems   Review of Systems  All other systems reviewed and are negative.   Physical Exam Updated Vital Signs BP (!) 154/68   Pulse 75   Temp 98.1 F (36.7 C) (Oral)   Resp 11   Ht 5\' 3"  (1.6 m)   Wt 73.5 kg   SpO2 97%   BMI 28.70 kg/m  Physical Exam Vitals and nursing note reviewed.  Constitutional:      General: She is not in acute distress.    Appearance: She is well-developed. She is ill-appearing.  HENT:     Head: Normocephalic and atraumatic.  Eyes:      Conjunctiva/sclera: Conjunctivae normal.  Cardiovascular:     Rate and Rhythm: Normal rate and regular rhythm.  Pulmonary:     Effort: Pulmonary effort is normal. No respiratory distress.     Breath sounds: Normal breath sounds.  Abdominal:     Palpations: Abdomen is soft.     Tenderness: There is no abdominal tenderness.  Musculoskeletal:        General: No swelling.     Cervical back: Neck supple.     Right lower leg: No edema.     Left lower leg: No edema.  Skin:    General: Skin is warm and dry.     Capillary Refill: Capillary refill takes less than 2 seconds.  Neurological:     Mental Status: She is alert.     Comments: MENTAL STATUS EXAM:    Orientation: Alert and oriented to person, place and time.  Memory: Cooperative, follows commands well.  Language: Speech is clear and language is normal.   CRANIAL NERVES:    CN 2 (Optic): Visual fields intact to confrontation.  CN 3,4,6 (EOM): Pupils equal and reactive to light. Full extraocular eye movement with slight bidirectional nystagmus and slight vertical nystagmus CN 5 (Trigeminal): Facial sensation is normal, no weakness of masticatory muscles.  CN 7 (Facial): No facial weakness or asymmetry.  CN 8 (Auditory): Auditory acuity grossly normal.  CN 9,10 (Glossophar): The uvula is midline, the palate elevates symmetrically.  CN 11 (spinal access): Normal sternocleidomastoid and trapezius strength.  CN 12 (Hypoglossal): The tongue is midline. No atrophy or fasciculations.Marland Kitchen   MOTOR:  Muscle Strength: 5/5RUE, 5/5LUE, 5/5RLE, 5/5LLE  REFLEXES: No clonus.   COORDINATION:   Intact finger-to-nose, no tremor.   SENSATION:   Intact to light touch all four extremities.  HINTS: Slight vertical nystagmus present Negative head impulse test without corrective saccade    Psychiatric:        Mood and Affect: Mood normal.     ED Results / Procedures / Treatments   Labs (all labs ordered are listed, but only abnormal results are  displayed) Labs Reviewed  COMPREHENSIVE METABOLIC PANEL - Abnormal; Notable for the following components:      Result Value   Glucose, Bld 166 (*)    BUN 25 (*)    Creatinine, Ser 1.03 (*)    GFR, Estimated 59 (*)    All other components within normal limits  CBC - Abnormal; Notable for the following components:   MCHC 29.5 (*)    All other components within normal limits  URINALYSIS, ROUTINE W REFLEX MICROSCOPIC - Abnormal; Notable for the following components:   Color, Urine COLORLESS (*)    Glucose, UA >1,000 (*)    Ketones, ur 15 (*)    Leukocytes,Ua TRACE (*)  All other components within normal limits  SARS CORONAVIRUS 2 BY RT PCR  LIPASE, BLOOD  TROPONIN I (HIGH SENSITIVITY)  TROPONIN I (HIGH SENSITIVITY)    EKG EKG Interpretation  Date/Time:  Tuesday September 16 2022 11:50:03 EDT Ventricular Rate:  72 PR Interval:  158 QRS Duration: 74 QT Interval:  382 QTC Calculation: 418 R Axis:   24 Text Interpretation: Normal sinus rhythm Low voltage QRS Septal infarct (cited on or before 12-Jun-2022) Abnormal ECG When compared with ECG of 12-Jun-2022 12:04, No significant change was found Confirmed by Ernie Avena ([id number]) on 09/16/2022 1:19:26 PM  Radiology CT ANGIO HEAD NECK W WO CM  Result Date: 09/16/2022 CLINICAL DATA:  Vertigo, central EXAM: CT ANGIOGRAPHY HEAD AND NECK WITH AND WITHOUT CONTRAST TECHNIQUE: Multidetector CT imaging of the head and neck was performed using the standard protocol during bolus administration of intravenous contrast. Multiplanar CT image reconstructions and MIPs were obtained to evaluate the vascular anatomy. Carotid stenosis measurements (when applicable) are obtained utilizing NASCET criteria, using the distal internal carotid diameter as the denominator. RADIATION DOSE REDUCTION: This exam was performed according to the departmental dose-optimization program which includes automated exposure control, adjustment of the mA and/or kV according to  patient size and/or use of iterative reconstruction technique. CONTRAST:  75mL OMNIPAQUE IOHEXOL 350 MG/ML SOLN COMPARISON:  None Available. FINDINGS: CT HEAD FINDINGS Brain: No evidence of acute large vascular territory infarction, hemorrhage, hydrocephalus, extra-axial collection or mass lesion/mass effect. Patchy white matter hypodensities, nonspecific but compatible with chronic microvascular disease. Vascular: See below. Skull: No acute fracture. Sinuses/Orbits: Clear sinuses.  No acute findings. Other: No mastoid effusions. Review of the MIP images confirms the above findings CTA NECK FINDINGS Aortic arch: Great vessel origins are patent without significant stenosis. Right carotid system: Atherosclerosis at the carotid bifurcation without greater than 50% stenosis. Left carotid system: Atherosclerosis at the carotid bifurcation without greater than 50% stenosis. Vertebral arteries: Codominant. No evidence of dissection, stenosis (50% or greater), or occlusion. Skeleton: No acute fracture on limited assessment. Other neck: No acute abnormality on limited assessment. Upper chest: Visualized lung apices are clear. Review of the MIP images confirms the above findings CTA HEAD FINDINGS Anterior circulation: Bilateral intracranial ICAs, MCAs, and ACAs are patent without proximal hemodynamically significant stenosis no aneurysm identified. No aneurysm identified. Posterior circulation: Bilateral intradural vertebral arteries, basilar artery, and bilateral posterior cerebral arteries are patent without proximal hemodynamically significant stenosis. Venous sinuses: As permitted by contrast timing, patent. Review of the MIP images confirms the above findings IMPRESSION: No large vessel occlusion or proximal hemodynamically significant stenosis. Electronically Signed   By: Feliberto Harts M.D.   On: 09/16/2022 15:35   DG Chest 2 View  Result Date: 09/16/2022 CLINICAL DATA:  Chest pain. EXAM: CHEST - 2 VIEW  COMPARISON:  January 31, 2013 chest x-ray. FINDINGS: Small nodular opacities in bilateral upper lungs. No consolidation. The heart size and mediastinal contours are within normal limits. The visualized skeletal structures are unremarkable. IMPRESSION: 1. Small nodular opacities in bilateral upper lungs. Recommend CT of the chest to exclude pulmonary nodules. 2. Otherwise, no acute abnormality. Electronically Signed   By: Feliberto Harts M.D.   On: 09/16/2022 14:18    Procedures Procedures    Medications Ordered in ED Medications  LORazepam (ATIVAN) tablet 0.5 mg (has no administration in time range)  ondansetron (ZOFRAN-ODT) disintegrating tablet 4 mg (4 mg Oral Given 09/16/22 1355)  lactated ringers bolus 1,000 mL (1,000 mLs Intravenous New Bag/Given 09/16/22 1400)  iohexol (OMNIPAQUE) 350 MG/ML injection 100 mL (75 mLs Intravenous Contrast Given 09/16/22 1430)  meclizine (ANTIVERT) tablet 25 mg (25 mg Oral Given 09/16/22 1657)    ED Course/ Medical Decision Making/ A&P                             Medical Decision Making Amount and/or Complexity of Data Reviewed Labs: ordered. Radiology: ordered.  Risk Prescription drug management.    67 year old female with medical history significant for hypothyroidism, diabetes mellitus, arthritis, anxiety who presents to the emergency department with 1 week of nausea and vomiting.  The patient has had intermittent room spinning dizziness over the past week.  She feels dehydrated.  She tried meclizine at home without success and vomited up the medicine.  She endorses some mild left-sided chest discomfort in the setting of her nausea and retching.  She denies any sharp pleuritic pain.  She denies any chest pressure.  She denies any ripping or tearing chest pain.  She denies any facial droop, focal numbness or weakness.  She continues to endorse room spinning dizziness.  She states that she normally does not have an elevated blood pressure.  With EMS her  blood pressure was 196/80.  She was administered 4 mg of IV Zofran without significant relief.  She has had trouble ambulating today due to her room spinning dizziness.  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    On arrival, the patient was vitally stable, afebrile, temperature 96.9, not tachycardic or tachypneic, initial BP 189/69, saturating well on room air.  Physical exam revealed no focal neurologic deficits, bidirectional nystagmus was appreciated that was subtle on exam with additional concern for slight vertical nystagmus.  No dysmetria, given the patient's ongoing sx of vertigo, gait testing was not performed. Considered ACS given the patient's mild chest discomfort. Additionally considered viral syndrome and labyrinthitis or vestibular neuronitis.   Workup initiated to include CBC, CMP, lipase, troponins x2, UA. CXR, EKG, CTA Head and Neck.   EKG: NSR rate 72, q waves in the septal leads, no acute ischemic changes.  CXR: IMPRESSION:  1. Small nodular opacities in bilateral upper lungs. Recommend CT of  the chest to exclude pulmonary nodules.  2. Otherwise, no acute abnormality.    The patient was administered an additional 4mg  of IV Zofran in addition to an IV fluid bolus.   Labs: UA with glucosuria, Covid PCR testing was negative, CMP revealed hyperglycemia to 166, without an anion gap acidosis, Cr 1.03, BUN 25, normal liver function and biliary function. Initial troponin was 4. Lipase was 16. CBC without a leukocytosis or anemia.  CTA Head and Neck: IMPRESSION:  No large vessel occlusion or proximal hemodynamically significant  stenosis.    I spoke with Dr. Selina Cooley of on-call neurology, who recommended MRI Brain to fully rule-out posterior circulation CVA. Dr. Jarold Motto accepted the patient in ER to ER transfer, by ambulance to her her ongoing vertigo. She was administered Meclizine for her ongoing  symptoms.    Final Clinical Impression(s) / ED Diagnoses Final diagnoses:  Dizziness  Nausea and vomiting, unspecified vomiting type  Pulmonary nodules    Rx / DC Orders ED Discharge Orders     None         Ernie Avena, MD 09/16/22 1729

## 2022-09-16 NOTE — ED Provider Notes (Signed)
Signout from Dr. Karene Fry.  67 year old female here with 1 week of dizziness nausea and vomiting.  No prior history of same.  Has some vertical nystagmus.  Still symptomatic despite Zofran.  Lab work and CTA unrevealing.  Neurology Dr. Selina Cooley recommending MRI. Physical Exam  BP (!) 187/67   Pulse 69   Temp (!) 96.9 F (36.1 C) (Temporal)   Resp 13   Ht 5\' 3"  (1.6 m)   Wt 73.5 kg   SpO2 99%   BMI 28.70 kg/m   Physical Exam  Procedures  Procedures  ED Course / MDM    Medical Decision Making Amount and/or Complexity of Data Reviewed Labs: ordered. Radiology: ordered.  Risk Prescription drug management.   Discussed with [facility] ED physician Jarold Motto is excepting in transfer.  Patient will go over by private vehicle.  MRI has been ordered.       Terrilee Files, MD 09/16/22 2122

## 2022-09-17 DIAGNOSIS — R112 Nausea with vomiting, unspecified: Secondary | ICD-10-CM | POA: Diagnosis not present

## 2022-09-17 LAB — CBG MONITORING, ED
Glucose-Capillary: 106 mg/dL — ABNORMAL HIGH (ref 70–99)
Glucose-Capillary: 263 mg/dL — ABNORMAL HIGH (ref 70–99)

## 2022-09-17 MED ORDER — OXYCODONE HCL 5 MG PO TABS
5.0000 mg | ORAL_TABLET | ORAL | Status: DC | PRN
  Administered 2022-09-17: 5 mg via ORAL
  Filled 2022-09-17: qty 1

## 2022-09-17 MED ORDER — MECLIZINE HCL 25 MG PO TABS: 25.0000 mg | ORAL_TABLET | Freq: Three times a day (TID) | ORAL | 0 refills | Status: DC | PRN

## 2022-09-17 MED ORDER — DIAZEPAM 5 MG PO TABS: 5.0000 mg | ORAL_TABLET | Freq: Two times a day (BID) | ORAL | 0 refills | Status: DC | PRN

## 2022-09-17 NOTE — Progress Notes (Signed)
Physical Therapy Treatment Patient Details Name: Krystal Lang MRN: [id number] DOB: November 06, 1955 Today's Date: 09/17/2022   History of Present Illness Patient is 67 y.o. female presented to ED with ~1 week of room spinning dizziness and vomiting. MRI negative for CVA and CT head and neck negative for occlusion/stenosis. Pt with some vertical nystagmus in ED on initial assessment. 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    PT Comments    Following evaluation additional session dove tailed to focus on ROM with Lt TKA. Supine exercises competed and reviewed for ROM and circulation. Will continue to progress pt as able.   Recommendations for follow up therapy are one component of a multi-disciplinary discharge planning process, led by the attending physician.  Recommendations may be updated based on patient status, additional functional criteria and insurance authorization.  Follow Up Recommendations       Assistance Recommended at Discharge Intermittent Supervision/Assistance  Patient can return home with the following A little help with walking and/or transfers;A little help with bathing/dressing/bathroom;Assistance with cooking/housework;Direct supervision/assist for medications management;Assist for transportation;Help with stairs or ramp for entrance   Equipment Recommendations  None recommended by PT    Recommendations for Other Services       Precautions / Restrictions Precautions Precautions: Fall;Knee Precaution Booklet Issued: No Restrictions Weight Bearing Restrictions: No     Mobility  Bed Mobility Overal bed mobility: Needs Assistance Bed Mobility: Rolling, Sidelying to Sit, Supine to Sit, Sit to Supine Rolling: Min guard Sidelying to sit: Min assist Supine to sit: Min assist Sit to supine: Supervision        Transfers Overall transfer level: Needs assistance Equipment used: Rolling walker (2 wheels) Transfers: Sit to/from  Stand Sit to Stand: Min guard, Min assist                Ambulation/Gait Ambulation/Gait assistance: Editor, commissioning (Feet): 75 Feet Assistive device: Rolling walker (2 wheels) Gait Pattern/deviations: Step-through pattern, Decreased stance time - left, Decreased step length - right, Decreased stride length, Decreased weight shift to left Gait velocity: decr         Stairs             Wheelchair Mobility    Modified Rankin (Stroke Patients Only)       Balance                                            Cognition Arousal/Alertness: Awake/alert Behavior During Therapy: WFL for tasks assessed/performed Overall Cognitive Status: Within Functional Limits for tasks assessed                                          Exercises Total Joint Exercises Ankle Circles/Pumps: Both, 15 reps, Supine Quad Sets: Both, 15 reps, Supine Short Arc Quad: Left, 15 reps, Supine Heel Slides: Left, 15 reps, Supine Straight Leg Raises: Left, 15 reps, Supine    General Comments        Pertinent Vitals/Pain Pain Assessment Pain Assessment: Faces Faces Pain Scale: Hurts little more Pain Location: Lt knee Pain Descriptors / Indicators: Discomfort Pain Intervention(s): Limited activity within patient's tolerance, Monitored during session, Repositioned    Home Living Family/patient expects to be discharged to:: Private residence Living Arrangements: Spouse/significant other;Children Available  Help at Discharge: Family;Available 24 hours/day Type of Home: House Home Access: Stairs to enter Entrance Stairs-Rails: Left Entrance Stairs-Number of Steps: 5 Alternate Level Stairs-Number of Steps: has a stair glide- flight straight up Home Layout: Two level;1/2 bath on main level;Bed/bath upstairs Home Equipment: Rolling Walker (2 wheels);Cane - single point      Prior Function            PT Goals (current goals can now be found in  the care plan section) Acute Rehab PT Goals Patient Stated Goal: get back home and recover from TKA well PT Goal Formulation: With patient Time For Goal Achievement: 10/01/22 Potential to Achieve Goals: Good Progress towards PT goals: Progressing toward goals    Frequency    Min 3X/week      PT Plan Current plan remains appropriate    Co-evaluation              AM-PAC PT "6 Clicks" Mobility   Outcome Measure  Help needed turning from your back to your side while in a flat bed without using bedrails?: None Help needed moving from lying on your back to sitting on the side of a flat bed without using bedrails?: A Little Help needed moving to and from a bed to a chair (including a wheelchair)?: A Little Help needed standing up from a chair using your arms (e.g., wheelchair or bedside chair)?: A Little Help needed to walk in hospital room?: A Little Help needed climbing 3-5 steps with a railing? : A Little 6 Click Score: 19    End of Session Equipment Utilized During Treatment: Gait belt Activity Tolerance: Patient tolerated treatment well Patient left: in bed;with call bell/phone within reach;with family/visitor present Nurse Communication: Mobility status PT Visit Diagnosis: Muscle weakness (generalized) (M62.81);Difficulty in walking, not elsewhere classified (R26.2);BPPV;Dizziness and giddiness (R42);Unsteadiness on feet (R26.81);Other abnormalities of gait and mobility (R26.89) BPPV - Right/Left : Left     Time: 9811-9147 PT Time Calculation (min) (ACUTE ONLY): 18 min  Charges:  $Therapeutic Exercise: 8-22 mins                      Wynn Maudlin, DPT Acute Rehabilitation Services Office [phone]  09/17/22 1:16 PM

## 2022-09-17 NOTE — Evaluation (Signed)
Physical Therapy Evaluation Patient Details Name: Krystal Lang MRN: [id number] DOB: Nov 14, 1955 Today's Date: 09/17/2022  History of Present Illness  Patient is 67 y.o. female presented to ED with ~1 week of room spinning dizziness and vomiting. MRI negative for CVA and CT head and neck negative for occlusion/stenosis. Pt with some vertical nystagmus in ED on initial assessment. 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   Clinical Impression  Krystal Lang is 67 y.o. female admitted with above HPI and diagnosis. Patient is currently limited by functional impairments below (see PT problem list). Patient lives with family has been recovering from Lt TKA and is still mobilizing with RW and Mod I level. Currently pt is limited by dizziness/vertigo and vestibular testing indicates possible Lt BPPV of posterior canal vs possible Bil BPPV. Lt side treated 1x with pt's dizziness rating 1/5 at EOS. Patient will benefit from continued skilled PT interventions to address impairments and progress independence with mobility, recommending follow up vestibular therapy with HHPT or at Outpatient office while continuing rehab from TKA. Acute PT will follow and progress as able.        Recommendations for follow up therapy are one component of a multi-disciplinary discharge planning process, led by the attending physician.  Recommendations may be updated based on patient status, additional functional criteria and insurance authorization.  Follow Up Recommendations       Assistance Recommended at Discharge Intermittent Supervision/Assistance  Patient can return home with the following  A little help with walking and/or transfers;A little help with bathing/dressing/bathroom;Assistance with cooking/housework;Direct supervision/assist for medications management;Assist for transportation;Help with stairs or ramp for entrance    Equipment Recommendations None recommended by PT   Recommendations for Other Services       Functional Status Assessment Patient has had a recent decline in their functional status and demonstrates the ability to make significant improvements in function in a reasonable and predictable amount of time.     Precautions / Restrictions Precautions Precautions: Fall;Knee Precaution Booklet Issued: No Restrictions Weight Bearing Restrictions: No       Vestibular Assessment - 09/17/22 0001       Vestibular Assessment   General Observation starting point for exam 0/5 for dizziness on today's exam.      Symptom Behavior   Subjective history of current problem no prior episodes. pt reports better than yesterday.    Type of Dizziness  Spinning;Unsteady with head/body turns;Vertigo    Frequency of Dizziness with movement/head turns it is worse    Duration of Dizziness variable    Symptom Nature Spontaneous    Aggravating Factors Looking up to the ceiling;Turning head sideways   closing eyes made   Relieving Factors Head stationary;Rest    Progression of Symptoms Better    History of similar episodes none      Oculomotor Exam   Oculomotor Alignment Normal    Ocular ROM WFL    Spontaneous Left beating nystagmus    Gaze-induced  Left beating nystagmus with R gaze;Left beating nystagmus with L gaze    Head shaking Horizontal Absent    Head Shaking Vertical Comment   light dizziness 1/5 (trouble fixating)   Smooth Pursuits Saccades    Saccades Undershoots   corrective saccade to Lt, slight correction Rt but not significant   Comment Test of Skew negative      Positional Testing   Dix-Hallpike Dix-Hallpike Right;Dix-Hallpike Left    Sidelying Test Sidelying Left;Sidelying Right  Dix-Hallpike Right   Dix-Hallpike Right Duration <30 seconds.    Dix-Hallpike Right Symptoms --   difficult to note, rotary nystagmus appear present. pt's eye bouncing around full ROM.     Dix-Hallpike Left   Dix-Hallpike Left Duration reports more  symptoms on Lt lasting <30 seconds.    Dix-Hallpike Left Symptoms --   difficult to note, rotary nystagmus appear present. pt's eye bouncing around full ROM.     Sidelying Right   Sidelying Right Symptoms No nystagmus      Sidelying Left   Sidelying Left Symptoms No nystagmus      Cognition   Cognition Orientation Level Oriented x 4      Positional Sensitivities   Supine to Sitting Moderate dizziness    Right Hallpike Lightheadedness    Up from Right Hallpike Lightheadedness    Up from Left Hallpike Mild dizziness             Mobility  Bed Mobility Overal bed mobility: Needs Assistance Bed Mobility: Rolling, Sidelying to Sit, Supine to Sit, Sit to Supine Rolling: Min guard Sidelying to sit: Min assist Supine to sit: Min assist Sit to supine: Supervision        Transfers Overall transfer level: Needs assistance Equipment used: Rolling walker (2 wheels) Transfers: Sit to/from Stand Sit to Stand: Min guard, Min assist                Ambulation/Gait Ambulation/Gait assistance: Editor, commissioning (Feet): 75 Feet Assistive device: Rolling walker (2 wheels) Gait Pattern/deviations: Step-through pattern, Decreased stance time - left, Decreased step length - right, Decreased stride length, Decreased weight shift to left Gait velocity: decr        Stairs            Wheelchair Mobility    Modified Rankin (Stroke Patients Only)       Balance                                             Pertinent Vitals/Pain Pain Assessment Pain Assessment: Faces Faces Pain Scale: Hurts little more Pain Location: Lt knee Pain Descriptors / Indicators: Discomfort Pain Intervention(s): Limited activity within patient's tolerance, Monitored during session, Repositioned    Home Living Family/patient expects to be discharged to:: Private residence Living Arrangements: Spouse/significant other;Children Available Help at Discharge:  Family;Available 24 hours/day Type of Home: House Home Access: Stairs to enter Entrance Stairs-Rails: Left Entrance Stairs-Number of Steps: 5 Alternate Level Stairs-Number of Steps: has a stair glide- flight straight up Home Layout: Two level;1/2 bath on main level;Bed/bath upstairs Home Equipment: Rolling Walker (2 wheels);Cane - single point      Prior Function Prior Level of Function : Independent/Modified Independent                     Hand Dominance   Dominant Hand: Right    Extremity/Trunk Assessment   Upper Extremity Assessment Upper Extremity Assessment: Overall WFL for tasks assessed    Lower Extremity Assessment Lower Extremity Assessment: Overall WFL for tasks assessed;LLE deficits/detail LLE Deficits / Details: recent TKA on Lt LE, incision well healing. AROM ~0-90 LLE Sensation: WNL LLE Coordination: WNL    Cervical / Trunk Assessment Cervical / Trunk Assessment: Normal  Communication   Communication: No difficulties  Cognition Arousal/Alertness: Awake/alert Behavior During Therapy: WFL for tasks assessed/performed Overall Cognitive Status: Within  Functional Limits for tasks assessed                                          General Comments      Exercises     Assessment/Plan    PT Assessment Patient needs continued PT services  PT Problem List Decreased strength;Decreased safety awareness;Decreased knowledge of use of DME;Decreased cognition;Decreased coordination;Decreased mobility;Decreased balance;Decreased activity tolerance;Decreased range of motion;Decreased knowledge of precautions;Impaired sensation       PT Treatment Interventions DME instruction;Cognitive remediation;Neuromuscular re-education;Balance training;Therapeutic exercise;Therapeutic activities;Functional mobility training;Stair training;Gait training;Patient/family education;Manual techniques    PT Goals (Current goals can be found in the Care Plan  section)  Acute Rehab PT Goals Patient Stated Goal: get back home and recover from TKA well PT Goal Formulation: With patient Time For Goal Achievement: 10/01/22 Potential to Achieve Goals: Good    Frequency Min 3X/week     Co-evaluation               AM-PAC PT "6 Clicks" Mobility  Outcome Measure Help needed turning from your back to your side while in a flat bed without using bedrails?: None Help needed moving from lying on your back to sitting on the side of a flat bed without using bedrails?: A Little Help needed moving to and from a bed to a chair (including a wheelchair)?: A Little Help needed standing up from a chair using your arms (e.g., wheelchair or bedside chair)?: A Little Help needed to walk in hospital room?: A Little Help needed climbing 3-5 steps with a railing? : A Little 6 Click Score: 19    End of Session Equipment Utilized During Treatment: Gait belt Activity Tolerance: Patient tolerated treatment well Patient left: in bed;with call bell/phone within reach;with family/visitor present Nurse Communication: Mobility status PT Visit Diagnosis: Muscle weakness (generalized) (M62.81);Difficulty in walking, not elsewhere classified (R26.2);BPPV;Dizziness and giddiness (R42);Unsteadiness on feet (R26.81);Other abnormalities of gait and mobility (R26.89) BPPV - Right/Left : Left    Time: 1610-9604 PT Time Calculation (min) (ACUTE ONLY): 49 min   Charges:   PT Evaluation $PT Eval Moderate Complexity: 1 Mod PT Treatments $Canalith Rep Proc: 8-22 mins $Physical Performance Test: 8-22 mins        Wynn Maudlin, DPT Acute Rehabilitation Services Office [phone]  09/17/22 1:09 PM

## 2022-09-17 NOTE — Progress Notes (Addendum)
Pt already established with [facility] for [facility] PT/OT - Requested EDP places order.   Addend @ 11:22 AM Notified Kelly at [facility] that orders are placed. No further TOC needs at this time.

## 2022-09-17 NOTE — ED Provider Notes (Signed)
  Physical Exam  BP (!) 136/51   Pulse 64   Temp 97.8 F (36.6 C) (Oral)   Resp 12   Ht 5\' 3"  (1.6 m)   Wt 73.5 kg   SpO2 97%   BMI 28.70 kg/m   Physical Exam  Procedures  Procedures  ED Course / MDM      Wrist care of patient from previous providers.  Please see their notes for prior history, physical and care.  Briefly this is 67 year old female with a history of hypothyroidism, diabetes, arthritis, anxiety, who presents with 1 week of nausea, vomiting, intermittent room spinning dizziness over the past week  I really personally reviewed the workup that was completed initially at [facility] which showed a negative COVID test, negative lipase, similar creatinine to prior, no significant electrolyte abnormalities, no anemia or leukocytosis, glucosuria without signs of urinary tract infection, negative troponin.  She denies chest pain or shortness of breath, and describes it more as a room spinning dizziness and lightheadedness and have loose suspicion clinically for pulmonary embolus.  A CTA was completed at [facility] that showed no evidence of large vessel occlusion or hemodynamically significant stenosis.  A chest x-ray was performed which showed small nodular opacities in the bilateral upper lungs and discussed this with the patient and family to recommend outpatient CT imaging to evaluate these nodules.  An MRI was completed last night which was negative for acute CVA.  She had persistent dizziness and was held in the emergency department overnight for symptomatic management, with plan for PT in the morning.  She reports her dizziness today is much improved since yesterday, and is worsened with head movements and improved with meclizine.  Do feel in the setting of negative MRI that this history is consistent with it being more likely a peripheral vertigo.  Did question eye movements on exam, and had neurology evaluate eye movements who confirms her nystagmus is not direction changing.  Her horizontal nystagmus is most consistent with that of peripheral vertigo.  PT evaluated and feel she is stable to return home with help of her daughter. Given rx for valium and meclizine and referral to ENT.  Patient discharged in stable condition with understanding of reasons to return.     Alvira Monday, MD 09/17/22 1119

## 2022-09-17 NOTE — Progress Notes (Addendum)
Transition of Care [facility]) - Emergency Department Mini Assessment   Patient Details  Name: Krystal Lang MRN: [id number] Date of Birth: July 03, 1955  Transition of Care [facility]) CM/SW Contact:    Princella Ion, LCSW Phone Number: 09/17/2022, 8:22 AM   Clinical Narrative: Chart reviewed and it appears pt has Medicare A/B without [facility] waiver. Pt does have an admission with the past 30 days (08/21/22); however, admission was only 32 hours - not a 3 midnight inpatient stay which is required by Medicare. This CSW will outreach to pt/family to inform.   Addend @ 8:11 AM Spoke with pt and Dr. Algie Coffer (spouse) and was informed they are not interested in SNF but wanted Home Health. Preferred agency is [facility]. This CSW will attempt to make contact.   Addend @ 8:22 AM Spoke with Tresa Endo at [facility] who informed they are able to accept pt for [facility] PT/OT and have worked with pt in the past. This CSW has outreached to provider to request Home Health orders. Pt and spouse notified. Spouse reported there is not a need for DME at home and reports the pt already has: BSC, Walker, and Wheelchair.    ED Mini Assessment: What brought you to the Emergency Department? : N/V  Barriers to Discharge: No Barriers Identified     Means of departure: Car       Patient Contact and Communications Key Contact 1: Patient Key Contact 2: Spouse - Dr. 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   Dyspareunia, female 11/13/2011   Type 2 diabetes mellitus (HCC) 11/13/2011   Alopecia 11/13/2011   History of  chicken pox    Headache(784.0)    Hypothyroidism    Menses, irregular    Hirsutism    H/O fatigue    Fibroid uterus    PCP:  Orpah Cobb, MD Pharmacy:   [facility] [phone] Ginette Otto, [place] - [address] AT [facility] [address] [place] [address] Phone: [phone] Fax: [phone]  [facility] [address] [place] [address] Phone: [phone] Fax: [phone]

## 2022-09-18 ENCOUNTER — Encounter (HOSPITAL_BASED_OUTPATIENT_CLINIC_OR_DEPARTMENT_OTHER): Payer: Self-pay | Admitting: Physical Therapy

## 2022-09-19 ENCOUNTER — Encounter (HOSPITAL_BASED_OUTPATIENT_CLINIC_OR_DEPARTMENT_OTHER): Payer: Self-pay | Admitting: Physical Therapy

## 2022-09-19 ENCOUNTER — Ambulatory Visit (HOSPITAL_BASED_OUTPATIENT_CLINIC_OR_DEPARTMENT_OTHER): Payer: Medicare Other | Admitting: Physical Therapy

## 2022-09-19 DIAGNOSIS — R262 Difficulty in walking, not elsewhere classified: Secondary | ICD-10-CM | POA: Diagnosis not present

## 2022-09-19 DIAGNOSIS — R42 Dizziness and giddiness: Secondary | ICD-10-CM

## 2022-09-19 NOTE — Therapy (Signed)
OUTPATIENT PHYSICAL THERAPY VESTIBULAR EVALUATION     Patient Name: Krystal Lang MRN: [id number] DOB:20-Mar-1956, 67 y.o., female Today's Date: 09/19/2022  END OF SESSION:  PT End of Session - 09/19/22 1452     Visit Number 1   1 vestibular, 2 TKR   Number of Visits 9   9 vestibular, 24 TKR   Date for PT Re-Evaluation 10/17/22   11/05/22 TKR   Authorization Type MEDICARE PART A AND B    Progress Note Due on Visit 10    PT Start Time 1305    PT Stop Time 1347    PT Time Calculation (min) 42 min    Activity Tolerance Patient tolerated treatment well    Behavior During Therapy WFL for tasks assessed/performed             Past Medical History:  Diagnosis Date   Anxiety    Arthritis    Cataract    Mixed form OU   Complication of anesthesia    after knee arthroscopy pateint difficult to wake up   Diabetes mellitus    Fibroid uterus 2001   H/O fatigue 1998   Headache(784.0)    denies   Hepatitis    A, 30 years ago   Hirsutism 1998   Idiopathic   History of chicken pox    Hypothyroidism    Menses, irregular 1998   Past Surgical History:  Procedure Laterality Date   COLONOSCOPY  02/2021   DILATION AND CURETTAGE OF UTERUS  03/31/2000   In [place]   KNEE ARTHROPLASTY Right    Replaced in [place], [place]   KNEE ARTHROSCOPY Right 2008   TONSILLECTOMY     As a child   TOTAL KNEE ARTHROPLASTY Left 08/21/2022   Procedure: LEFT TOTAL KNEE ARTHROPLASTY;  Surgeon: Kathryne Hitch, MD;  Location: [facility];  Service: Orthopedics;  Laterality: Left;   Patient Active Problem List   Diagnosis Date Noted   Status post total left knee replacement 08/21/2022   Dyspareunia, female 11/13/2011   Type 2 diabetes mellitus (HCC) 11/13/2011   Alopecia 11/13/2011   History of chicken pox    Headache(784.0)    Hypothyroidism    Menses, irregular    Hirsutism    H/O fatigue    Fibroid uterus     PCP: Orpah Cobb MD  REFERRING PROVIDER: Orpah Cobb MD   REFERRING DIAG:  Vertigo  THERAPY DIAG:  Dizziness and giddiness  ONSET DATE: Vestibular- last week  Rationale for Evaluation and Treatment: Rehabilitation  SUBJECTIVE:   SUBJECTIVE STATEMENT: I noticed I was feeling dizzy when I was doing my knee exercises last week, especially when I was looking down to do my knee exercises. After that I started feeling dizzy when I was doing exercises on table, as soon as I got up from being on the table it got a lot worse and I was throwing up every 5 minutes, this last for 2 hours. Went to the ED and got taken care of. Got sent to [facility] for imaging and that was all normal.   Pt accompanied by: family member  PERTINENT HISTORY: Anxiety, DM, fatigue, HA, hepatitis, hypothyroidism, B TKR   PAIN:  Are you having pain? Yes: NPRS scale: 4/10 Pain location: L knee  Pain description: stiffness and tightness  Aggravating factors: exercise  Relieving factors: resting  Dizziness: 3/10  PRECAUTIONS: Fall  WEIGHT BEARING RESTRICTIONS: No  FALLS: Has patient fallen in last 6 months? No  LIVING ENVIRONMENT: Lives with:  lives with their family Lives in: House/apartment Stairs:  Has following equipment at home: Dan Humphreys - 2 wheeled  PLOF: Independent, Independent with basic ADLs, Independent with gait, and Independent with transfers  PATIENT GOALS: get rid of dizziness   OBJECTIVE:   DIAGNOSTIC FINDINGS: IMPRESSION: 1. Small nodular opacities in bilateral upper lungs. Recommend CT of the chest to exclude pulmonary nodules. 2. Otherwise, no acute abnormality.  CTA HEAD FINDINGS   Anterior circulation: Bilateral intracranial ICAs, MCAs, and ACAs are patent without proximal hemodynamically significant stenosis no aneurysm identified. No aneurysm identified.   Posterior circulation: Bilateral intradural vertebral arteries, basilar artery, and bilateral posterior cerebral arteries are patent without proximal hemodynamically significant stenosis.   Venous  sinuses: As permitted by contrast timing, patent.   Review of the MIP images confirms the above findings   IMPRESSION: No large vessel occlusion or proximal hemodynamically significant stenosis.  EXAM: MRI HEAD WITHOUT CONTRAST   TECHNIQUE: Multiplanar, multiecho pulse sequences of the brain and surrounding structures were obtained without intravenous contrast.   COMPARISON:  None Available.   FINDINGS: Brain: No acute infarct, mass effect or extra-axial collection. No acute or chronic hemorrhage. There is multifocal hyperintense T2-weighted signal within the white matter. Parenchymal volume and CSF spaces are normal. The midline structures are normal.   Vascular: Major flow voids are preserved.   Skull and upper cervical spine: Normal calvarium and skull base. Visualized upper cervical spine and soft tissues are normal.   Sinuses/Orbits:No paranasal sinus fluid levels or advanced mucosal thickening. No mastoid or middle ear effusion. Normal orbits.   IMPRESSION: 1. No acute intracranial abnormality. 2. Multifocal hyperintense T2-weighted signal within the white matter, most consistent with chronic small vessel ischemia.      COGNITION: Overall cognitive status: Within functional limits for tasks assessed         VESTIBULAR ASSESSMENT:     SYMPTOM BEHAVIOR:  Subjective history: see above   Non-Vestibular symptoms:  none   Type of dizziness: Spinning/Vertigo  Frequency: unsure  Duration: 15 seconds   Aggravating factors: Induced by position change: lying supine, rolling to the right, rolling to the left, and supine to sit and Induced by motion: looking up at the ceiling, bending down to the ground, turning body quickly, and turning head quickly  Relieving factors: medication and rest  Progression of symptoms: better  OCULOMOTOR EXAM:  Ocular Alignment: normal  Ocular ROM: No Limitations  Spontaneous Nystagmus: absent  Gaze-Induced Nystagmus: left beating  with left gaze and left beating with right gaze  Smooth Pursuits: intact  Saccades: intact  Convergence/Divergence: within 5cm of nose     VESTIBULAR - OCULAR REFLEX:   Slow VOR: Normal  VOR Cancellation: Normal  Head-Impulse Test: HIT Right: negative HIT Left: negative     POSITIONAL TESTING: Right Dix-Hallpike: no nystagmus Left Dix-Hallpike: no nystagmus Right Roll Test: no nystagmus Left Roll Test: geotropic nystagmus  MOTION SENSITIVITY:  Motion Sensitivity Quotient Intensity: 0 = none, 1 = Lightheaded, 2 = Mild, 3 = Moderate, 4 = Severe, 5 = Vomiting  Intensity  1. Sitting to supine   2. Supine to L side   3. Supine to R side   4. Supine to sitting   5. L Hallpike-Dix   6. Up from L    7. R Hallpike-Dix   8. Up from R    9. Sitting, head tipped to L knee   10. Head up from L knee   11. Sitting, head tipped to R knee  12. Head up from R knee   13. Sitting head turns x5   14.Sitting head nods x5   15. In stance, 180 turn to L    16. In stance, 180 turn to R     OTHOSTATICS: Supine: 141/68 with HR 77  Sitting 128/75  HR 84 Standing: 135/74 with HR 84 Standing x3 minutes     VESTIBULAR TREATMENT:                                                                                                   DATE:   Eval   Objective vestibular measures, education on POC, posterior vs horizontal canal anatomy and testing, orthostatic BP and negative test today, importance of vestibular input in BPPV rehab,  vestibular interventions next session- asked her to try not to take meclizine next session so we can weed out posterior vs horizontal canal involvement and laterality    PATIENT EDUCATION: Education details: see above  Person educated: Patient Education method: Explanation Education comprehension: verbalized understanding and needs further education  HOME EXERCISE PROGRAM:  GOALS: Goals reviewed with patient? Yes  SHORT TERM GOALS: Target date: 10/17/2022     Will be compliant with appropriate progressive vestibular HEP  Baseline: Goal status: INITIAL  2.  Dizziness with position changes to have resolved  Baseline:  Goal status: INITIAL  3.  Will be able to perform Epley maneuver with no more than MinA from family in the event of repeated dizziness  Baseline:  Goal status: INITIAL    LONG TERM GOALS: Target date: no long term vestibular goals appropriate at this time     ASSESSMENT:  CLINICAL IMPRESSION: Patient is a 67 y.o. F who was seen today for physical therapy evaluation and treatment for vertigo. She is well known to this clinic as she has been attending PT for skilled care following TKR. 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 Unfortunately she had taken some meclizine prior to PT vestibular eval which I do feel dampened symptoms/made assessment difficult. She did not have any nystagmus or symptoms with Gilberto Better testing, R roll test for horizontal canal was negative/no nystagmus noted, also no nystagmus noted L roll test for horizontal canal but she did have a slight increase in vertigo in this position which lasted just a few seconds. Orthostatics were negative, and she does have some movement sensitivities that are consistent with potential ongoing posterior canal BPPV even with lack of nystagmus/positive dix hallpike. 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 Asked her to kindly not take meclizine prior to PT session tomorrow so we can recheck posterior and horizontal canals and further narrow down which area we need to focus on. Dizziness improved to 1-2/10 at [facility]. Would like her to be seen at least 1x/week by a vestibular therapist, note she is already coming here for skilled TKR care- once we further narrow down which  canal is causing  her vertigo, we may also be able to include repositioning maneuvers along with standard TKR care as well, given that this should take less and less time as her vertigo improves.   OBJECTIVE IMPAIRMENTS: Abnormal gait, decreased balance, decreased knowledge of use of DME, and dizziness.   ACTIVITY LIMITATIONS: transfers and locomotion level  PARTICIPATION LIMITATIONS: meal prep, cleaning, laundry, shopping, and community activity  PERSONAL FACTORS: Age, Fitness, Past/current experiences, and Time since onset of injury/illness/exacerbation are also affecting patient's functional outcome.   REHAB POTENTIAL: Good  CLINICAL DECISION MAKING: Evolving/moderate complexity  EVALUATION COMPLEXITY: Moderate   PLAN:  PT FREQUENCY: 1-2x/week  PT DURATION: 4 weeks  PLANNED INTERVENTIONS: Therapeutic activity, Neuromuscular re-education, Balance training, Gait training, Vestibular training, Canalith repositioning, and Re-evaluation  PLAN FOR NEXT SESSION: asked her not to take meclizine before next session- recheck [employer] and roll test for horizontal canal, work with pt and family so they can do appropriate maneuver at home   Nedra Hai, PT, DPT 09/19/22 2:55 PM

## 2022-09-20 ENCOUNTER — Encounter (HOSPITAL_BASED_OUTPATIENT_CLINIC_OR_DEPARTMENT_OTHER): Payer: Self-pay | Admitting: Physical Therapy

## 2022-09-20 ENCOUNTER — Ambulatory Visit (HOSPITAL_BASED_OUTPATIENT_CLINIC_OR_DEPARTMENT_OTHER): Payer: Medicare Other | Admitting: Physical Therapy

## 2022-09-20 DIAGNOSIS — R262 Difficulty in walking, not elsewhere classified: Secondary | ICD-10-CM

## 2022-09-20 DIAGNOSIS — M6281 Muscle weakness (generalized): Secondary | ICD-10-CM

## 2022-09-20 DIAGNOSIS — M25562 Pain in left knee: Secondary | ICD-10-CM

## 2022-09-20 NOTE — Therapy (Signed)
OUTPATIENT PHYSICAL THERAPY LOWER EXTREMITY EVALUATION   Patient Name: BLEU MOISAN MRN: [id number] DOB:Dec 17, 1955, 67 y.o., female Today's Date: 09/20/2022  END OF SESSION:  PT End of Session - 09/20/22 1248     Visit Number 3    Number of Visits 24    Date for PT Re-Evaluation 11/05/22    Authorization Type MEDICARE PART A AND B    Progress Note Due on Visit 10    PT Start Time 1245    PT Stop Time 1332    PT Time Calculation (min) 47 min    Activity Tolerance Patient tolerated treatment well    Behavior During Therapy WFL for tasks assessed/performed              Past Medical History:  Diagnosis Date   Anxiety    Arthritis    Cataract    Mixed form OU   Complication of anesthesia    after knee arthroscopy pateint difficult to wake up   Diabetes mellitus    Fibroid uterus 2001   H/O fatigue 1998   Headache(784.0)    denies   Hepatitis    A, 30 years ago   Hirsutism 1998   Idiopathic   History of chicken pox    Hypothyroidism    Menses, irregular 1998   Past Surgical History:  Procedure Laterality Date   COLONOSCOPY  02/2021   DILATION AND CURETTAGE OF UTERUS  03/31/2000   In [place]   KNEE ARTHROPLASTY Right    Replaced in [place], [place]   KNEE ARTHROSCOPY Right 2008   TONSILLECTOMY     As a child   TOTAL KNEE ARTHROPLASTY Left 08/21/2022   Procedure: LEFT TOTAL KNEE ARTHROPLASTY;  Surgeon: Kathryne Hitch, MD;  Location: [facility];  Service: Orthopedics;  Laterality: Left;   Patient Active Problem List   Diagnosis Date Noted   Status post total left knee replacement 08/21/2022   Dyspareunia, female 11/13/2011   Type 2 diabetes mellitus (HCC) 11/13/2011   Alopecia 11/13/2011   History of chicken pox    Headache(784.0)    Hypothyroidism    Menses, irregular    Hirsutism    H/O fatigue    Fibroid uterus     PCP: Dr Orpah Cobb   REFERRING PROVIDER: Dr Doneen Poisson   REFERRING DIAG:  Diagnosis  [phone] (ICD-10-CM) - Status  post left knee replacement    THERAPY DIAG:  Difficulty in walking, not elsewhere classified  Muscle weakness (generalized)  Acute pain of left knee  Rationale for Evaluation and Treatment: Rehabilitation  ONSET DATE: 08/21/2022  SUBJECTIVE:   SUBJECTIVE STATEMENT: Still lacking confidence in walking. Front of the knee is so tight. Took vertigo meds this AM.   PERTINENT HISTORY: Right TKA, diabetes, anxiety, PAIN:  Are you having pain? Yes: NPRS scale: 3/10 Pain location: left knee  Pain description: stiffness  Aggravating factors: Standing and walking, certain positionins  Relieving factors:    PRECAUTIONS: None  WEIGHT BEARING RESTRICTIONS: Yes WBAT   FALLS:  Has patient fallen in last 6 months? No  LIVING ENVIRONMENT: 4 steps into her house  OCCUPATION:   PLOF: Independent  PATIENT GOALS:   To have less pain in her knee and her back   OBJECTIVE:   DIAGNOSTIC FINDINGS: Nothing post-op   PATIENT SURVEYS:  6/15: LEFS 28  PALPATION: 6/15: edema around Lt knee compared to Rt  LOWER EXTREMITY ROM:  Passive ROM Left eval Left 6/15 Left  Hip flexion  Hip extension     Hip abduction     Hip adduction     Hip internal rotation     Hip external rotation     Knee flexion 87 87   Knee extension -5 0   Ankle dorsiflexion     Ankle plantarflexion     Ankle inversion     Ankle eversion      (Blank rows = not tested)  LOWER EXTREMITY MMT:  MMT (lb) Right 6/15 Left 6/15  Hip flexion    Hip extension    Hip abduction    Hip adduction    Hip internal rotation    Hip external rotation    Knee flexion 25.5 18.7  Knee extension 33.5 13.7   (Blank rows = not tested) not tested secondary to recent surgery and significant lower back pain    GAIT: Eval: Walking with stiff leg.  Knee not unlocking during heel strike to toe off phase.  Mild side-to-side movement.  Mild forward flexion leaning onto walker.  Therapy advised patient to try to walk  more in the walker and upright to take stress off her back.  TODAY'S TREATMENT:                                                                                                                               Treatment                            6/22:  Nu step 5 min L4 LE only Cone taps- hands on and off of walker 2" step ups Alt LAQ- with red band around both ankles to engage bil LEs Gait with SPC around clinic with gait belt- cues for toe off and knee flexion in swing through Stepping over hurdles alternating feet Recumb bike 5 min   Treatment                            6/15:  Nu step 9 min Heel slides with strap- hold 5 breaths x5 SAQ x10 SLR x10 Sidelying hip abduction x10, x20 Standing weight shift lateral and in wide tandem Toe off for flexion in swing through   PATIENT EDUCATION:  Education details: Anatomy of condition, POC, HEP, exercise form/rationale Person educated: Patient Education method: Explanation, Demonstration, Tactile cues, Verbal cues, and Handouts Education comprehension: verbalized understanding, returned demonstration, verbal cues required, tactile cues required, and needs further education  HOME EXERCISE PROGRAM: Access Code: [email or web address] URL: [email or web address]   ASSESSMENT:  CLINICAL IMPRESSION: Cont to c/o fear and lack of confidence that she did not feel that she experienced after her last TKA. At this time she does have sufficient strength and ROM for ambulation from my view. Did not retest vestibular positions as she did take her medications this AM.    OBJECTIVE IMPAIRMENTS: Abnormal gait, decreased activity tolerance, decreased knowledge of  condition, decreased knowledge of use of DME, decreased mobility, difficulty walking, decreased ROM, decreased strength, and pain.   ACTIVITY LIMITATIONS: carrying, lifting, bending, sitting, squatting, stairs, transfers, and locomotion level  PARTICIPATION LIMITATIONS: meal prep,  cleaning, laundry, driving, shopping, community activity, and yard work  PERSONAL FACTORS: 1-2 comorbidities: Right TKA, significant lower back pain  are also affecting patient's functional outcome.   REHAB POTENTIAL: Good  CLINICAL DECISION MAKING: Evolving/moderate complexity significant lower back pain limiting patient's function  EVALUATION COMPLEXITY: Moderate   GOALS: Goals reviewed with patient? Yes  SHORT TERM GOALS: Target date: 10/08/2022   Patient will increase passive left knee flexion to 120 degrees Baseline: Goal status: INITIAL  2.  Patient will demonstrate full active left knee extension Baseline:  Goal status: INITIAL  3.  Patient will progress to least restrictive assistive device with ambulation distance of 300 feet without significant pain and with good safety Baseline:  Goal status: INITIAL  4.  Patient will be independent with baseline HEP Baseline:  Goal status: INITIAL  5.  Patient will report a 75% reduction in low back pain on the left side Baseline:  Goal status: INITIAL   LONG TERM GOALS: Target date: 11/05/2022    Patient will go up and down 8 steps with reciprocal gait pattern in order to improve community ambulation Baseline:  Goal status: INITIAL  2.  Patient will ambulate 3000 feet in the community without increased pain with least restrictive assistive device in order to go shopping Baseline:  Goal status: INITIAL  3.  Patient will be independent with complete exercise program to promote general health and bilateral knee strength Baseline:  Goal status: INITIAL    PLAN:  PT FREQUENCY: 3x/week patient will like to do 3 times a week until her low back improves  PT DURATION: 8 weeks  PLANNED INTERVENTIONS: Therapeutic exercises, Therapeutic activity, Neuromuscular re-education, Balance training, Gait training, Patient/Family education, Self Care, Joint mobilization, Stair training, DME instructions, Aquatic Therapy, Dry  Needling, Spinal mobilization, Cryotherapy, Moist heat, Taping, Ultrasound, and Manual therapy  PLAN FOR NEXT SESSION: Continue with manual therapy to lower back to reduce pain.  Consider needling when she is outside the surgical window for needling.  Progress to normal TKA activities as back allows.  Review cane training as quad muscle firing allows.   Nakoma Gotwalt C. Warden Buffa PT, DPT 09/20/22 2:37 PM

## 2022-09-22 ENCOUNTER — Ambulatory Visit (HOSPITAL_BASED_OUTPATIENT_CLINIC_OR_DEPARTMENT_OTHER): Payer: Medicare Other | Admitting: Physical Therapy

## 2022-09-22 ENCOUNTER — Encounter (HOSPITAL_BASED_OUTPATIENT_CLINIC_OR_DEPARTMENT_OTHER): Payer: Self-pay | Admitting: Physical Therapy

## 2022-09-22 DIAGNOSIS — R262 Difficulty in walking, not elsewhere classified: Secondary | ICD-10-CM | POA: Diagnosis not present

## 2022-09-22 DIAGNOSIS — M6281 Muscle weakness (generalized): Secondary | ICD-10-CM

## 2022-09-22 DIAGNOSIS — M25562 Pain in left knee: Secondary | ICD-10-CM

## 2022-09-22 NOTE — Therapy (Signed)
OUTPATIENT PHYSICAL THERAPY LOWER EXTREMITY EVALUATION   Patient Name: Krystal Lang MRN: [id number] DOB:May 29, 1955, 67 y.o., female Today's Date: 09/22/2022  END OF SESSION:  PT End of Session - 09/22/22 1349     Visit Number 4    Number of Visits 24    Date for PT Re-Evaluation 11/05/22    Authorization Type MEDICARE PART A AND B    Progress Note Due on Visit 10    PT Start Time 1345    PT Stop Time 1428    PT Time Calculation (min) 43 min    Activity Tolerance Patient tolerated treatment well    Behavior During Therapy WFL for tasks assessed/performed              Past Medical History:  Diagnosis Date   Anxiety    Arthritis    Cataract    Mixed form OU   Complication of anesthesia    after knee arthroscopy pateint difficult to wake up   Diabetes mellitus    Fibroid uterus 2001   H/O fatigue 1998   Headache(784.0)    denies   Hepatitis    A, 30 years ago   Hirsutism 1998   Idiopathic   History of chicken pox    Hypothyroidism    Menses, irregular 1998   Past Surgical History:  Procedure Laterality Date   COLONOSCOPY  02/2021   DILATION AND CURETTAGE OF UTERUS  03/31/2000   In [place]   KNEE ARTHROPLASTY Right    Replaced in [place], [place]   KNEE ARTHROSCOPY Right 2008   TONSILLECTOMY     As a child   TOTAL KNEE ARTHROPLASTY Left 08/21/2022   Procedure: LEFT TOTAL KNEE ARTHROPLASTY;  Surgeon: Kathryne Hitch, MD;  Location: [facility];  Service: Orthopedics;  Laterality: Left;   Patient Active Problem List   Diagnosis Date Noted   Status post total left knee replacement 08/21/2022   Dyspareunia, female 11/13/2011   Type 2 diabetes mellitus (HCC) 11/13/2011   Alopecia 11/13/2011   History of chicken pox    Headache(784.0)    Hypothyroidism    Menses, irregular    Hirsutism    H/O fatigue    Fibroid uterus     PCP: Dr Orpah Cobb   REFERRING PROVIDER: Dr Doneen Poisson   REFERRING DIAG:  Diagnosis  [phone] (ICD-10-CM) - Status  post left knee replacement    THERAPY DIAG:  Difficulty in walking, not elsewhere classified  Muscle weakness (generalized)  Acute pain of left knee  Rationale for Evaluation and Treatment: Rehabilitation  ONSET DATE: 08/21/2022  SUBJECTIVE:   SUBJECTIVE STATEMENT: Working on bending my leg when I am walking.   PERTINENT HISTORY: Right TKA, diabetes, anxiety, PAIN:  Are you having pain? Yes: NPRS scale: 3/10 Pain location: left knee  Pain description: stiffness  Aggravating factors: Standing and walking, certain positionins  Relieving factors:    PRECAUTIONS: None  WEIGHT BEARING RESTRICTIONS: Yes WBAT   FALLS:  Has patient fallen in last 6 months? No  LIVING ENVIRONMENT: 4 steps into her house  OCCUPATION:   PLOF: Independent  PATIENT GOALS:   To have less pain in her knee and her back   OBJECTIVE:   DIAGNOSTIC FINDINGS: Nothing post-op   PATIENT SURVEYS:  6/15: LEFS 28  PALPATION: 6/15: edema around Lt knee compared to Rt  LOWER EXTREMITY ROM:  Passive ROM Left eval Left 6/15 Left  Hip flexion     Hip extension     Hip  abduction     Hip adduction     Hip internal rotation     Hip external rotation     Knee flexion 87 87   Knee extension -5 0   Ankle dorsiflexion     Ankle plantarflexion     Ankle inversion     Ankle eversion      (Blank rows = not tested)  LOWER EXTREMITY MMT:  MMT (lb) Right 6/15 Left 6/15  Hip flexion    Hip extension    Hip abduction    Hip adduction    Hip internal rotation    Hip external rotation    Knee flexion 25.5 18.7  Knee extension 33.5 13.7   (Blank rows = not tested) not tested secondary to recent surgery and significant lower back pain    GAIT: Eval: Walking with stiff leg.  Knee not unlocking during heel strike to toe off phase.  Mild side-to-side movement.  Mild forward flexion leaning onto walker.  Therapy advised patient to try to walk more in the walker and upright to take stress off her  back.  TODAY'S TREATMENT:                                                                                                                               Treatment                            6/24:  Bike 10 min Gait with pushing walker away and then walking without device Sit<>stand with ball bw knees, to fatigue 6" step taps Gait with high steps with walker   Treatment                            6/22:  Nu step 5 min L4 LE only Cone taps- hands on and off of walker 2" step ups Alt LAQ- with red band around both ankles to engage bil LEs Gait with SPC around clinic with gait belt- cues for toe off and knee flexion in swing through Stepping over hurdles alternating feet Recumb bike 5 min   Treatment                            6/15:  Nu step 9 min Heel slides with strap- hold 5 breaths x5 SAQ x10 SLR x10 Sidelying hip abduction x10, x20 Standing weight shift lateral and in wide tandem Toe off for flexion in swing through   PATIENT EDUCATION:  Education details: Anatomy of condition, POC, HEP, exercise form/rationale Person educated: Patient Education method: Explanation, Demonstration, Tactile cues, Verbal cues, and Handouts Education comprehension: verbalized understanding, returned demonstration, verbal cues required, tactile cues required, and needs further education  HOME EXERCISE PROGRAM: Access Code: [email or web address] URL: [email or web address]  TID amb 5-10 min with high steps and pushing walker away.  ASSESSMENT:  CLINICAL IMPRESSION: Message sent to [facility] neuro to f/u with vestibular appt as Baxter Hire will not be back to [facility].  Pt was able to ambulate with more confidence today when pushing walker away and taking steps toward it. Also performed high stepping to improve knee flexion in swing through which caused fatigue & 2 incidence of buckling- no risk of falling with the buckling as she caught herself indepdendently.    OBJECTIVE IMPAIRMENTS: Abnormal  gait, decreased activity tolerance, decreased knowledge of condition, decreased knowledge of use of DME, decreased mobility, difficulty walking, decreased ROM, decreased strength, and pain.   ACTIVITY LIMITATIONS: carrying, lifting, bending, sitting, squatting, stairs, transfers, and locomotion level  PARTICIPATION LIMITATIONS: meal prep, cleaning, laundry, driving, shopping, community activity, and yard work  PERSONAL FACTORS: 1-2 comorbidities: Right TKA, significant lower back pain  are also affecting patient's functional outcome.   REHAB POTENTIAL: Good  CLINICAL DECISION MAKING: Evolving/moderate complexity significant lower back pain limiting patient's function  EVALUATION COMPLEXITY: Moderate   GOALS: Goals reviewed with patient? Yes  SHORT TERM GOALS: Target date: 10/08/2022   Patient will increase passive left knee flexion to 120 degrees Baseline: Goal status: INITIAL  2.  Patient will demonstrate full active left knee extension Baseline:  Goal status: INITIAL  3.  Patient will progress to least restrictive assistive device with ambulation distance of 300 feet without significant pain and with good safety Baseline:  Goal status: INITIAL  4.  Patient will be independent with baseline HEP Baseline:  Goal status: INITIAL  5.  Patient will report a 75% reduction in low back pain on the left side Baseline:  Goal status: INITIAL   LONG TERM GOALS: Target date: 11/05/2022    Patient will go up and down 8 steps with reciprocal gait pattern in order to improve community ambulation Baseline:  Goal status: INITIAL  2.  Patient will ambulate 3000 feet in the community without increased pain with least restrictive assistive device in order to go shopping Baseline:  Goal status: INITIAL  3.  Patient will be independent with complete exercise program to promote general health and bilateral knee strength Baseline:  Goal status: INITIAL    PLAN:  PT FREQUENCY:  3x/week patient will like to do 3 times a week until her low back improves  PT DURATION: 8 weeks  PLANNED INTERVENTIONS: Therapeutic exercises, Therapeutic activity, Neuromuscular re-education, Balance training, Gait training, Patient/Family education, Self Care, Joint mobilization, Stair training, DME instructions, Aquatic Therapy, Dry Needling, Spinal mobilization, Cryotherapy, Moist heat, Taping, Ultrasound, and Manual therapy  PLAN FOR NEXT SESSION: Continue with manual therapy to lower back to reduce pain.  Consider needling when she is outside the surgical window for needling.  Progress to normal TKA activities as back allows.  Review cane training as quad muscle firing allows.   Chassity Ludke C. Zilah Villaflor PT, DPT 09/22/22 2:31 PM

## 2022-09-25 ENCOUNTER — Encounter (HOSPITAL_BASED_OUTPATIENT_CLINIC_OR_DEPARTMENT_OTHER): Payer: Self-pay | Admitting: Physical Therapy

## 2022-09-25 ENCOUNTER — Ambulatory Visit (HOSPITAL_BASED_OUTPATIENT_CLINIC_OR_DEPARTMENT_OTHER): Payer: Medicare Other | Admitting: Physical Therapy

## 2022-09-25 DIAGNOSIS — R262 Difficulty in walking, not elsewhere classified: Secondary | ICD-10-CM

## 2022-09-25 DIAGNOSIS — M25662 Stiffness of left knee, not elsewhere classified: Secondary | ICD-10-CM

## 2022-09-25 DIAGNOSIS — M25562 Pain in left knee: Secondary | ICD-10-CM

## 2022-09-25 DIAGNOSIS — R600 Localized edema: Secondary | ICD-10-CM

## 2022-09-25 DIAGNOSIS — M6281 Muscle weakness (generalized): Secondary | ICD-10-CM

## 2022-09-25 NOTE — Therapy (Signed)
OUTPATIENT PHYSICAL THERAPY LOWER EXTREMITY EVALUATION   Patient Name: Krystal Lang MRN: [id number] DOB:04/30/1955, 67 y.o., female Today's Date: 09/25/2022  END OF SESSION:  PT End of Session - 09/25/22 1413     Visit Number 5    Number of Visits 24    Date for PT Re-Evaluation 11/05/22    Authorization Type MEDICARE PART A AND B    PT Start Time 1100    PT Stop Time 1144    PT Time Calculation (min) 44 min    Activity Tolerance Patient tolerated treatment well    Behavior During Therapy WFL for tasks assessed/performed               Past Medical History:  Diagnosis Date   Anxiety    Arthritis    Cataract    Mixed form OU   Complication of anesthesia    after knee arthroscopy pateint difficult to wake up   Diabetes mellitus    Fibroid uterus 2001   H/O fatigue 1998   Headache(784.0)    denies   Hepatitis    A, 30 years ago   Hirsutism 1998   Idiopathic   History of chicken pox    Hypothyroidism    Menses, irregular 1998   Past Surgical History:  Procedure Laterality Date   COLONOSCOPY  02/2021   DILATION AND CURETTAGE OF UTERUS  03/31/2000   In [place]   KNEE ARTHROPLASTY Right    Replaced in [place], [place]   KNEE ARTHROSCOPY Right 2008   TONSILLECTOMY     As a child   TOTAL KNEE ARTHROPLASTY Left 08/21/2022   Procedure: LEFT TOTAL KNEE ARTHROPLASTY;  Surgeon: Blackman, Christopher Y, MD;  Location: [facility];  Service: Orthopedics;  Laterality: Left;   Patient Active Problem List   Diagnosis Date Noted   Status post total left knee replacement 08/21/2022   Dyspareunia, female 11/13/2011   Type 2 diabetes mellitus (HCC) 11/13/2011   Alopecia 11/13/2011   History of chicken pox    Headache(784.0)    Hypothyroidism    Menses, irregular    Hirsutism    H/O fatigue    Fibroid uterus     PCP: Dr Ajay Montel   REFERRING PROVIDER: Dr Christopher Blackman   REFERRING DIAG:  Diagnosis  Z96.652 (ICD-10-CM) - Status post left knee replacement     THERAPY DIAG:  Difficulty in walking, not elsewhere classified  Muscle weakness (generalized)  Acute pain of left knee  Localized edema  Stiffness of left knee, not elsewhere classified  Rationale for Evaluation and Treatment: Rehabilitation  ONSET DATE: 08/21/2022  SUBJECTIVE:   SUBJECTIVE STATEMENT: Reports her knee feels very tight.  She reports no significant pain just to significant tightness in the knee.  She is still using her walker.  She is concerned that she is still using a walker.  Her back and vertigo are both improved.  She reports when her back does hurt it limits her significantly.  PERTINENT HISTORY: Right TKA, diabetes, anxiety, PAIN:  Are you having pain? Yes: NPRS scale: 0/10 Pain location: left knee  Pain description: stiffness  Aggravating factors: Standing and walking, certain positionins  Relieving factors:    PRECAUTIONS: None  WEIGHT BEARING RESTRICTIONS: Yes WBAT   FALLS:  Has patient fallen in last 6 months? No  LIVING ENVIRONMENT: 4 steps into her house  OCCUPATION:   PLOF: Independent  PATIENT GOALS:   To have less pain in her knee and her back   OBJECTIVE:     DIAGNOSTIC FINDINGS: Nothing post-op   PATIENT SURVEYS:  6/15: LEFS 28  PALPATION: 6/15: edema around Lt knee compared to Rt  LOWER EXTREMITY ROM:  Passive ROM Left eval Left 6/15 Left 6/27  Hip flexion     Hip extension     Hip abduction     Hip adduction     Hip internal rotation     Hip external rotation     Knee flexion 87 87 108  Knee extension -5 0 0 with overpressure  Ankle dorsiflexion     Ankle plantarflexion     Ankle inversion     Ankle eversion      (Blank rows = not tested)  LOWER EXTREMITY MMT:  MMT (lb) Right 6/15 Left 6/15  Hip flexion    Hip extension    Hip abduction    Hip adduction    Hip internal rotation    Hip external rotation    Knee flexion 25.5 18.7  Knee extension 33.5 13.7   (Blank rows = not tested) not  tested secondary to recent surgery and significant lower back pain    GAIT: Eval: Walking with stiff leg.  Knee not unlocking during heel strike to toe off phase.  Mild side-to-side movement.  Mild forward flexion leaning onto walker.  Therapy advised patient to try to walk more in the walker and upright to take stress off her back.  TODAY'S TREATMENT:                                                                                                                              6/27  NuStep: 5 minutes level 3  Manual: Passive range of motion into flexion and extension.  Grade 1 and 2 PA and AP mobilizations.  Trigger point release to IT band and hamstrings to improve extension.  Patellar mobilization.  Straight leg raise 3 x 10 patient reports that she uses 5 pound weight at home patient advised to be cautious of her back with 5 pounds straight leg raise  Long arc quad 5 pounds 3 x 15  Gait: Ambulated with 1 crutch on right side.  Contact-guard assist with gait belt used.  Patient uses a stiff leg but that is to be expected at this point.  She was advised as her strength improves she will available unlocked her knee more effectively with ambulation.  No significant loss of balance with use of single-point cane  Standing: Slow march with right knee for left knee loading 3 x 10 Treatment                            6/24:  Bike 10 min Gait with pushing walker away and then walking without device Sit<>stand with ball bw knees, to fatigue 6" step taps Gait with high steps with walker   Treatment                              6/22:  Nu step 5 min L4 LE only Cone taps- hands on and off of walker 2" step ups Alt LAQ- with red band around both ankles to engage bil LEs Gait with SPC around clinic with gait belt- cues for toe off and knee flexion in swing through Stepping over hurdles alternating feet Recumb bike 5 min   Treatment                            6/15:  Nu step 9 min Heel slides  with strap- hold 5 breaths x5 SAQ x10 SLR x10 Sidelying hip abduction x10, x20 Standing weight shift lateral and in wide tandem Toe off for flexion in swing through   PATIENT EDUCATION:  Education details: Anatomy of condition, POC, HEP, exercise form/rationale Person educated: Patient Education method: Explanation, Demonstration, Tactile cues, Verbal cues, and Handouts Education comprehension: verbalized understanding, returned demonstration, verbal cues required, tactile cues required, and needs further education  HOME EXERCISE PROGRAM: Access Code: [email or web address] URL: [email or web address]  TID amb 5-10 min with high steps and pushing walker away.    ASSESSMENT:  CLINICAL IMPRESSION: Patient is making good progress.  She is concerned about her progress.  Therapy reassured her that her motion strength and stability have improved significantly.  She reports her knee is tight but she has no concerning swelling or loss of mobility.  She was advised she can practice with her cane at home as long as her husband is with her.  As she becomes more comfortable and confident with a cane she can wean off of it.  She was advised to monitor back pain as she is walking without the cane.  Therapy will continue to progress as tolerated.  Pt was able to ambulate with more confidence today when pushing walker away and taking steps toward it. Also performed high stepping to improve knee flexion in swing through which caused fatigue & 2 incidence of buckling- no risk of falling with the buckling as she caught herself indepdendently.    OBJECTIVE IMPAIRMENTS: Abnormal gait, decreased activity tolerance, decreased knowledge of condition, decreased knowledge of use of DME, decreased mobility, difficulty walking, decreased ROM, decreased strength, and pain.   ACTIVITY LIMITATIONS: carrying, lifting, bending, sitting, squatting, stairs, transfers, and locomotion level  PARTICIPATION LIMITATIONS:  meal prep, cleaning, laundry, driving, shopping, community activity, and yard work  PERSONAL FACTORS: 1-2 comorbidities: Right TKA, significant lower back pain  are also affecting patient's functional outcome.   REHAB POTENTIAL: Good  CLINICAL DECISION MAKING: Evolving/moderate complexity significant lower back pain limiting patient's function  EVALUATION COMPLEXITY: Moderate   GOALS: Goals reviewed with patient? Yes  SHORT TERM GOALS: Target date: 10/08/2022   Patient will increase passive left knee flexion to 120 degrees Baseline: Goal status: INITIAL  2.  Patient will demonstrate full active left knee extension Baseline:  Goal status: INITIAL  3.  Patient will progress to least restrictive assistive device with ambulation distance of 300 feet without significant pain and with good safety Baseline:  Goal status: INITIAL  4.  Patient will be independent with baseline HEP Baseline:  Goal status: INITIAL  5.  Patient will report a 75% reduction in low back pain on the left side Baseline:  Goal status: INITIAL   LONG TERM GOALS: Target date: 11/05/2022    Patient will go up and down 8 steps with reciprocal gait pattern in order to improve community ambulation Baseline:  Goal status: INITIAL  2.    Patient will ambulate 3000 feet in the community without increased pain with least restrictive assistive device in order to go shopping Baseline:  Goal status: INITIAL  3.  Patient will be independent with complete exercise program to promote general health and bilateral knee strength Baseline:  Goal status: INITIAL    PLAN:  PT FREQUENCY: 3x/week patient will like to do 3 times a week until her low back improves  PT DURATION: 8 weeks  PLANNED INTERVENTIONS: Therapeutic exercises, Therapeutic activity, Neuromuscular re-education, Balance training, Gait training, Patient/Family education, Self Care, Joint mobilization, Stair training, DME instructions, Aquatic Therapy,  Dry Needling, Spinal mobilization, Cryotherapy, Moist heat, Taping, Ultrasound, and Manual therapy  PLAN FOR NEXT SESSION: Continue with manual therapy to lower back to reduce pain.  Consider needling when she is outside the surgical window for needling.  Progress to normal TKA activities as back allows.  Review cane training as quad muscle firing allows.   Jessica C. Hightower PT, DPT 09/25/22 2:15 PM 

## 2022-09-26 ENCOUNTER — Ambulatory Visit: Payer: Medicare Other | Attending: Cardiovascular Disease | Admitting: Physical Therapy

## 2022-09-26 ENCOUNTER — Encounter: Payer: Self-pay | Admitting: Physical Therapy

## 2022-09-26 DIAGNOSIS — R42 Dizziness and giddiness: Secondary | ICD-10-CM | POA: Insufficient documentation

## 2022-09-26 NOTE — Therapy (Signed)
OUTPATIENT PHYSICAL THERAPY VESTIBULAR TREATMENT NOTE     Patient Name: Krystal Lang MRN: [id number] DOB:06-Nov-1955, 67 y.o., female Today's Date: 09/26/2022  END OF SESSION:  PT End of Session - 09/26/22 0933     Visit Number 6   2 in vestibular POC   Number of Visits --   9 vestibular, 24 TKR   Date for PT Re-Evaluation 10/17/22   11/05/22 TKR   Authorization Type MEDICARE PART A AND B    Progress Note Due on Visit 10    PT Start Time 0934    PT Stop Time 1012    PT Time Calculation (min) 38 min    Activity Tolerance Patient tolerated treatment well    Behavior During Therapy WFL for tasks assessed/performed             Past Medical History:  Diagnosis Date   Anxiety    Arthritis    Cataract    Mixed form OU   Complication of anesthesia    after knee arthroscopy pateint difficult to wake up   Diabetes mellitus    Fibroid uterus 2001   H/O fatigue 1998   Headache(784.0)    denies   Hepatitis    A, 30 years ago   Hirsutism 1998   Idiopathic   History of chicken pox    Hypothyroidism    Menses, irregular 1998   Past Surgical History:  Procedure Laterality Date   COLONOSCOPY  02/2021   DILATION AND CURETTAGE OF UTERUS  03/31/2000   In [place]   KNEE ARTHROPLASTY Right    Replaced in [place], [place]   KNEE ARTHROSCOPY Right 2008   TONSILLECTOMY     As a child   TOTAL KNEE ARTHROPLASTY Left 08/21/2022   Procedure: LEFT TOTAL KNEE ARTHROPLASTY;  Surgeon: Kathryne Hitch, MD;  Location: [facility];  Service: Orthopedics;  Laterality: Left;   Patient Active Problem List   Diagnosis Date Noted   Status post total left knee replacement 08/21/2022   Dyspareunia, female 11/13/2011   Type 2 diabetes mellitus (HCC) 11/13/2011   Alopecia 11/13/2011   History of chicken pox    Headache(784.0)    Hypothyroidism    Menses, irregular    Hirsutism    H/O fatigue    Fibroid uterus     PCP: Orpah Cobb MD  REFERRING PROVIDER: Orpah Cobb MD   REFERRING  DIAG: Vertigo  THERAPY DIAG:  Dizziness and giddiness  ONSET DATE: Vestibular- last week  Rationale for Evaluation and Treatment: Rehabilitation  SUBJECTIVE:   SUBJECTIVE STATEMENT: Feel much better than I was before.  Not taking the Meclizine, not for the last several days.  Occasional feeling of dizziness with rolling and with looking down.  Pt accompanied by: family member  PERTINENT HISTORY: Anxiety, DM, fatigue, HA, hepatitis, hypothyroidism, B TKR   PAIN:  Are you having pain? Yes: NPRS scale: 2-3/10 Pain location: L knee  Pain description: stiffness and tightness  Aggravating factors: exercise  Relieving factors: resting  Dizziness: 1/10  PRECAUTIONS: Fall  WEIGHT BEARING RESTRICTIONS: No  FALLS: Has patient fallen in last 6 months? No  LIVING ENVIRONMENT: Lives with: lives with their family Lives in: House/apartment Stairs:  Has following equipment at home: Environmental consultant - 2 wheeled  PLOF: Independent, Independent with basic ADLs, Independent with gait, and Independent with transfers  PATIENT GOALS: get rid of dizziness   OBJECTIVE:    TODAY'S TREATMENT: 09/26/2022 Activity Comments  R DH No nystagmus, funny feeling, feels dizzy  upon sitting up; resolves within seconds  L DH No nystagmus, funny feeling  R rolling test Slight apogeotropic nystagmus, no feeling of dizziness noted  L roll test No nystagmus, no symptoms  (Saccadic intrusions noted with all positional testing)   Habituation exercises practiced with rolling to L and R Reports minimal dizziness  Habituation exercise with forward lean/neck flexion x 5 reps Minimal dizziness, 1/10   Pt asks multiple questions about pain in L hip area (SI joint?) that has been going on since surgery Recommended pt ask and follow up with ortho PT in case SI joint is involved       PATIENT EDUCATION: Education details: no BPPV noted today, symptoms improving; educated in habituation for rolling and forward lean  positions-added to HEP  Person educated: Patient, family Education method: Explanation, demo, handouts Education comprehension: verbalized/demo understanding and needs further education  Access Code: [email or web address] URL: [email or web address] Date: 09/26/2022 Prepared by: [facility]  Re[facility] - [facility]  Exercises - Supine to Right Sidelying Vestibular Habituation  - 1-2 x daily - 7 x weekly - 1 sets - 3 reps - Supine to Left Sidelying Vestibular Habituation  - 1- x daily - 7 x weekly - 1 sets - 3 reps - Seated to Fold Over Vestibular Habituation  - 1-2 x daily - 7 x weekly - 1 sets - 5 reps -------------------------------------------------------------------- Objective measures below taken at initial vestibular evaluation: DIAGNOSTIC FINDINGS: IMPRESSION: 1. Small nodular opacities in bilateral upper lungs. Recommend CT of the chest to exclude pulmonary nodules. 2. Otherwise, no acute abnormality.  CTA HEAD FINDINGS   Anterior circulation: Bilateral intracranial ICAs, MCAs, and ACAs are patent without proximal hemodynamically significant stenosis no aneurysm identified. No aneurysm identified.   Posterior circulation: Bilateral intradural vertebral arteries, basilar artery, and bilateral posterior cerebral arteries are patent without proximal hemodynamically significant stenosis.   Venous sinuses: As permitted by contrast timing, patent.   Review of the MIP images confirms the above findings   IMPRESSION: No large vessel occlusion or proximal hemodynamically significant stenosis.  EXAM: MRI HEAD WITHOUT CONTRAST   TECHNIQUE: Multiplanar, multiecho pulse sequences of the brain and surrounding structures were obtained without intravenous contrast.   COMPARISON:  None Available.   FINDINGS: Brain: No acute infarct, mass effect or extra-axial collection. No acute or chronic hemorrhage. There is multifocal hyperintense T2-weighted signal within the  white matter. Parenchymal volume and CSF spaces are normal. The midline structures are normal.   Vascular: Major flow voids are preserved.   Skull and upper cervical spine: Normal calvarium and skull base. Visualized upper cervical spine and soft tissues are normal.   Sinuses/Orbits:No paranasal sinus fluid levels or advanced mucosal thickening. No mastoid or middle ear effusion. Normal orbits.   IMPRESSION: 1. No acute intracranial abnormality. 2. Multifocal hyperintense T2-weighted signal within the white matter, most consistent with chronic small vessel ischemia.      COGNITION: Overall cognitive status: Within functional limits for tasks assessed         VESTIBULAR ASSESSMENT:     SYMPTOM BEHAVIOR:  Subjective history: see above   Non-Vestibular symptoms:  none   Type of dizziness: Spinning/Vertigo  Frequency: unsure  Duration: 15 seconds   Aggravating factors: Induced by position change: lying supine, rolling to the right, rolling to the left, and supine to sit and Induced by motion: looking up at the ceiling, bending down to the ground, turning body quickly, and turning head quickly  Relieving factors: medication and rest  Progression of symptoms: better  OCULOMOTOR EXAM:  Ocular Alignment: normal  Ocular ROM: No Limitations  Spontaneous Nystagmus: absent  Gaze-Induced Nystagmus: left beating with left gaze and left beating with right gaze  Smooth Pursuits: intact  Saccades: intact  Convergence/Divergence: within 5cm of nose     VESTIBULAR - OCULAR REFLEX:   Slow VOR: Normal  VOR Cancellation: Normal  Head-Impulse Test: HIT Right: negative HIT Left: negative     POSITIONAL TESTING: Right Dix-Hallpike: no nystagmus Left Dix-Hallpike: no nystagmus Right Roll Test: no nystagmus Left Roll Test: geotropic nystagmus  MOTION SENSITIVITY:  Motion Sensitivity Quotient Intensity: 0 = none, 1 = Lightheaded, 2 = Mild, 3 = Moderate, 4 = Severe, 5 =  Vomiting  Intensity  1. Sitting to supine   2. Supine to L side   3. Supine to R side   4. Supine to sitting   5. L Hallpike-Dix   6. Up from L    7. R Hallpike-Dix   8. Up from R    9. Sitting, head tipped to L knee   10. Head up from L knee   11. Sitting, head tipped to R knee   12. Head up from R knee   13. Sitting head turns x5   14.Sitting head nods x5   15. In stance, 180 turn to L    16. In stance, 180 turn to R     OTHOSTATICS: Supine: 141/68 with HR 77  Sitting 128/75  HR 84 Standing: 135/74 with HR 84 Standing x3 minutes     VESTIBULAR TREATMENT:                                                                                                   DATE:   Eval   Objective vestibular measures, education on POC, posterior vs horizontal canal anatomy and testing, orthostatic BP and negative test today, importance of vestibular input in BPPV rehab,  vestibular interventions next session- asked her to try not to take meclizine next session so we can weed out posterior vs horizontal canal involvement and laterality    PATIENT EDUCATION: Education details: see above  Person educated: Patient Education method: Explanation Education comprehension: verbalized understanding and needs further education  HOME EXERCISE PROGRAM:  GOALS: Goals reviewed with patient? Yes  SHORT TERM GOALS: Target date: 10/17/2022    Will be compliant with appropriate progressive vestibular HEP  Baseline: Goal status: INITIAL  2.  Dizziness with position changes to have resolved  Baseline:  Goal status: INITIAL  3.  Will be able to perform Epley maneuver with no more than MinA from family in the event of repeated dizziness  Baseline:  Goal status: INITIAL    LONG TERM GOALS: Target date: no long term vestibular goals appropriate at this time     ASSESSMENT:  CLINICAL IMPRESSION: Pt returns today for session for vestibular follow up.  Pt does report overall improvement in  symptoms and she has not had to take Meclizine for the past several days.  Reassessed R and L [employer] positions as well as R and  L roll testing.  Pt reports feeling "funny" in most of these positions, but no spinning sensation noted.  No nystagmus except for R roll test, slight apogeotrophic, but no specific symptoms noted.  She does appear to have saccadic intrusions/extra eye movements in all positions.  Given no specific vertigo symptoms with any of the positional testing, worked on habituation exercises for the positions where most has symptoms-rolling and seated forward bending/neck flexion.  Provided HEP for this and pt verbalizes/demo understanding.  Pt does ask several questions about pain in posterior hip/SI joint area on the L, which has been bothering her specifically since surgery and with certain movements, such as seated knee flexion (with increased trunk lean to R).  As this visit was specifically to address vestibular/vertigo concerns and she is currently working with ortho PT following knee surgery, recommended pt speak again to these therapists about the pain near L SI joint.  OBJECTIVE IMPAIRMENTS: Abnormal gait, decreased balance, decreased knowledge of use of DME, and dizziness.   ACTIVITY LIMITATIONS: transfers and locomotion level  PARTICIPATION LIMITATIONS: meal prep, cleaning, laundry, shopping, and community activity  PERSONAL FACTORS: Age, Fitness, Past/current experiences, and Time since onset of injury/illness/exacerbation are also affecting patient's functional outcome.   REHAB POTENTIAL: Good  CLINICAL DECISION MAKING: Evolving/moderate complexity  EVALUATION COMPLEXITY: Moderate   PLAN:  PT FREQUENCY: 1-2x/week  PT DURATION: 4 weeks  PLANNED INTERVENTIONS: Therapeutic activity, Neuromuscular re-education, Balance training, Gait training, Vestibular training, Canalith repositioning, and Re-evaluation  PLAN FOR NEXT SESSION: Recheck positional testing as  appropriate; follow up about habituation and progress as needed  Lonia Blood, PT 09/26/22 12:18 PM Phone: [phone] Fax: [phone]   [facility] at [facility] [address], [address] [place], [place] [address] Phone # [phone] Fax # [phone]

## 2022-09-26 NOTE — Therapy (Deleted)
OUTPATIENT PHYSICAL THERAPY LOWER EXTREMITY EVALUATION   Patient Name: Krystal Lang MRN: [id number] DOB:[birth year], 67 y.o., female Today's Date: 09/25/2022  END OF SESSION:  PT End of Session - 09/25/22 1413     Visit Number 5    Number of Visits 24    Date for PT Re-Evaluation 11/05/22    Authorization Type MEDICARE PART A AND B    PT Start Time 1100    PT Stop Time 1144    PT Time Calculation (min) 44 min    Activity Tolerance Patient tolerated treatment well    Behavior During Therapy WFL for tasks assessed/performed               Past Medical History:  Diagnosis Date   Anxiety    Arthritis    Cataract    Mixed form OU   Complication of anesthesia    after knee arthroscopy pateint difficult to wake up   Diabetes mellitus    Fibroid uterus 2001   H/O fatigue 1998   Headache(784.0)    denies   Hepatitis    A, 30 years ago   Hirsutism 1998   Idiopathic   History of chicken pox    Hypothyroidism    Menses, irregular 1998   Past Surgical History:  Procedure Laterality Date   COLONOSCOPY  02/2021   DILATION AND CURETTAGE OF UTERUS  03/31/2000   In [place]   KNEE ARTHROPLASTY Right    Replaced in [place], [place]   KNEE ARTHROSCOPY Right 2008   TONSILLECTOMY     As a child   TOTAL KNEE ARTHROPLASTY Left 08/21/2022   Procedure: LEFT TOTAL KNEE ARTHROPLASTY;  Surgeon: Kathryne Hitch, MD;  Location: [facility];  Service: Orthopedics;  Laterality: Left;   Patient Active Problem List   Diagnosis Date Noted   Status post total left knee replacement 08/21/2022   Dyspareunia, female 11/13/2011   Type 2 diabetes mellitus (HCC) 11/13/2011   Alopecia 11/13/2011   History of chicken pox    Headache(784.0)    Hypothyroidism    Menses, irregular    Hirsutism    H/O fatigue    Fibroid uterus     PCP: Dr Orpah Cobb   REFERRING PROVIDER: Dr Doneen Poisson   REFERRING DIAG:  Diagnosis  [phone] (ICD-10-CM) - Status post left knee replacement     THERAPY DIAG:  Difficulty in walking, not elsewhere classified  Muscle weakness (generalized)  Acute pain of left knee  Localized edema  Stiffness of left knee, not elsewhere classified  Rationale for Evaluation and Treatment: Rehabilitation  ONSET DATE: 08/21/2022  SUBJECTIVE:   SUBJECTIVE STATEMENT: Reports her knee feels very tight.  She reports no significant pain just to significant tightness in the knee.  She is still using her walker.  She is concerned that she is still using a walker.  Her back and vertigo are both improved.  She reports when her back does hurt it limits her significantly.  PERTINENT HISTORY: Right TKA, diabetes, anxiety, PAIN:  Are you having pain? Yes: NPRS scale: 0/10 Pain location: left knee  Pain description: stiffness  Aggravating factors: Standing and walking, certain positionins  Relieving factors:    PRECAUTIONS: None  WEIGHT BEARING RESTRICTIONS: Yes WBAT   FALLS:  Has patient fallen in last 6 months? No  LIVING ENVIRONMENT: 4 steps into her house  OCCUPATION:   PLOF: Independent  PATIENT GOALS:   To have less pain in her knee and her back   OBJECTIVE:  DIAGNOSTIC FINDINGS: Nothing post-op   PATIENT SURVEYS:  6/15: LEFS 28  PALPATION: 6/15: edema around Lt knee compared to Rt  LOWER EXTREMITY ROM:  Passive ROM Left eval Left 6/15 Left 6/27  Hip flexion     Hip extension     Hip abduction     Hip adduction     Hip internal rotation     Hip external rotation     Knee flexion 87 87 108  Knee extension -5 0 0 with overpressure  Ankle dorsiflexion     Ankle plantarflexion     Ankle inversion     Ankle eversion      (Blank rows = not tested)  LOWER EXTREMITY MMT:  MMT (lb) Right 6/15 Left 6/15  Hip flexion    Hip extension    Hip abduction    Hip adduction    Hip internal rotation    Hip external rotation    Knee flexion 25.5 18.7  Knee extension 33.5 13.7   (Blank rows = not tested) not  tested secondary to recent surgery and significant lower back pain    GAIT: Eval: Walking with stiff leg.  Knee not unlocking during heel strike to toe off phase.  Mild side-to-side movement.  Mild forward flexion leaning onto walker.  Therapy advised patient to try to walk more in the walker and upright to take stress off her back.  TODAY'S TREATMENT:                                                                                                                              6/27  NuStep: 5 minutes level 3  Manual: Passive range of motion into flexion and extension.  Grade 1 and 2 PA and AP mobilizations.  Trigger point release to IT band and hamstrings to improve extension.  Patellar mobilization.  Straight leg raise 3 x 10 patient reports that she uses 5 pound weight at home patient advised to be cautious of her back with 5 pounds straight leg raise  Long arc quad 5 pounds 3 x 15  Gait: Ambulated with 1 crutch on right side.  Contact-guard assist with gait belt used.  Patient uses a stiff leg but that is to be expected at this point.  She was advised as her strength improves she will available unlocked her knee more effectively with ambulation.  No significant loss of balance with use of single-point cane  Standing: Slow march with right knee for left knee loading 3 x 10 Treatment                            6/24:  Bike 10 min Gait with pushing walker away and then walking without device Sit<>stand with ball bw knees, to fatigue 6" step taps Gait with high steps with walker   Treatment  6/22:  Nu step 5 min L4 LE only Cone taps- hands on and off of walker 2" step ups Alt LAQ- with red band around both ankles to engage bil LEs Gait with SPC around clinic with gait belt- cues for toe off and knee flexion in swing through Stepping over hurdles alternating feet Recumb bike 5 min   Treatment                            6/15:  Nu step 9 min Heel slides  with strap- hold 5 breaths x5 SAQ x10 SLR x10 Sidelying hip abduction x10, x20 Standing weight shift lateral and in wide tandem Toe off for flexion in swing through   PATIENT EDUCATION:  Education details: Anatomy of condition, POC, HEP, exercise form/rationale Person educated: Patient Education method: Explanation, Demonstration, Tactile cues, Verbal cues, and Handouts Education comprehension: verbalized understanding, returned demonstration, verbal cues required, tactile cues required, and needs further education  HOME EXERCISE PROGRAM: Access Code: [email or web address] URL: [email or web address]  TID amb 5-10 min with high steps and pushing walker away.    ASSESSMENT:  CLINICAL IMPRESSION: Patient is making good progress.  She is concerned about her progress.  Therapy reassured her that her motion strength and stability have improved significantly.  She reports her knee is tight but she has no concerning swelling or loss of mobility.  She was advised she can practice with her cane at home as long as her husband is with her.  As she becomes more comfortable and confident with a cane she can wean off of it.  She was advised to monitor back pain as she is walking without the cane.  Therapy will continue to progress as tolerated.  Pt was able to ambulate with more confidence today when pushing walker away and taking steps toward it. Also performed high stepping to improve knee flexion in swing through which caused fatigue & 2 incidence of buckling- no risk of falling with the buckling as she caught herself indepdendently.    OBJECTIVE IMPAIRMENTS: Abnormal gait, decreased activity tolerance, decreased knowledge of condition, decreased knowledge of use of DME, decreased mobility, difficulty walking, decreased ROM, decreased strength, and pain.   ACTIVITY LIMITATIONS: carrying, lifting, bending, sitting, squatting, stairs, transfers, and locomotion level  PARTICIPATION LIMITATIONS:  meal prep, cleaning, laundry, driving, shopping, community activity, and yard work  PERSONAL FACTORS: 1-2 comorbidities: Right TKA, significant lower back pain  are also affecting patient's functional outcome.   REHAB POTENTIAL: Good  CLINICAL DECISION MAKING: Evolving/moderate complexity significant lower back pain limiting patient's function  EVALUATION COMPLEXITY: Moderate   GOALS: Goals reviewed with patient? Yes  SHORT TERM GOALS: Target date: 10/08/2022   Patient will increase passive left knee flexion to 120 degrees Baseline: Goal status: INITIAL  2.  Patient will demonstrate full active left knee extension Baseline:  Goal status: INITIAL  3.  Patient will progress to least restrictive assistive device with ambulation distance of 300 feet without significant pain and with good safety Baseline:  Goal status: INITIAL  4.  Patient will be independent with baseline HEP Baseline:  Goal status: INITIAL  5.  Patient will report a 75% reduction in low back pain on the left side Baseline:  Goal status: INITIAL   LONG TERM GOALS: Target date: 11/05/2022    Patient will go up and down 8 steps with reciprocal gait pattern in order to improve community ambulation Baseline:  Goal status: INITIAL  2.  Patient will ambulate 3000 feet in the community without increased pain with least restrictive assistive device in order to go shopping Baseline:  Goal status: INITIAL  3.  Patient will be independent with complete exercise program to promote general health and bilateral knee strength Baseline:  Goal status: INITIAL    PLAN:  PT FREQUENCY: 3x/week patient will like to do 3 times a week until her low back improves  PT DURATION: 8 weeks  PLANNED INTERVENTIONS: Therapeutic exercises, Therapeutic activity, Neuromuscular re-education, Balance training, Gait training, Patient/Family education, Self Care, Joint mobilization, Stair training, DME instructions, Aquatic Therapy,  Dry Needling, Spinal mobilization, Cryotherapy, Moist heat, Taping, Ultrasound, and Manual therapy  PLAN FOR NEXT SESSION: Continue with manual therapy to lower back to reduce pain.  Consider needling when she is outside the surgical window for needling.  Progress to normal TKA activities as back allows.  Review cane training as quad muscle firing allows.   Jessica C. Hightower PT, DPT 09/25/22 2:15 PM

## 2022-09-29 ENCOUNTER — Ambulatory Visit (HOSPITAL_BASED_OUTPATIENT_CLINIC_OR_DEPARTMENT_OTHER): Payer: Medicare Other | Attending: Orthopaedic Surgery | Admitting: Physical Therapy

## 2022-09-29 ENCOUNTER — Encounter (HOSPITAL_BASED_OUTPATIENT_CLINIC_OR_DEPARTMENT_OTHER): Payer: Self-pay | Admitting: Physical Therapy

## 2022-09-29 DIAGNOSIS — M25562 Pain in left knee: Secondary | ICD-10-CM | POA: Insufficient documentation

## 2022-09-29 DIAGNOSIS — R262 Difficulty in walking, not elsewhere classified: Secondary | ICD-10-CM | POA: Diagnosis present

## 2022-09-29 DIAGNOSIS — R600 Localized edema: Secondary | ICD-10-CM | POA: Insufficient documentation

## 2022-09-29 DIAGNOSIS — M6281 Muscle weakness (generalized): Secondary | ICD-10-CM | POA: Diagnosis present

## 2022-09-29 DIAGNOSIS — M25662 Stiffness of left knee, not elsewhere classified: Secondary | ICD-10-CM | POA: Diagnosis present

## 2022-09-29 NOTE — Therapy (Incomplete)
OUTPATIENT PHYSICAL THERAPY LOWER EXTREMITY EVALUATION   Patient Name: Krystal Lang MRN: [id number] DOB:November 02, 1955, 67 y.o., female Today's Date: 09/29/2022  END OF SESSION:  PT End of Session - 09/29/22 1150     Visit Number 7    Number of Visits --   9 vestibular, 24 TKR   Date for PT Re-Evaluation 10/17/22   11/05/22 TKR   Authorization Type MEDICARE PART A AND B    Progress Note Due on Visit 10    PT Start Time 1145    PT Stop Time 1230    PT Time Calculation (min) 45 min    Equipment Utilized During Treatment Gait belt    Activity Tolerance Patient tolerated treatment well    Behavior During Therapy WFL for tasks assessed/performed                Past Medical History:  Diagnosis Date  . Anxiety   . Arthritis   . Cataract    Mixed form OU  . Complication of anesthesia    after knee arthroscopy pateint difficult to wake up  . Diabetes mellitus   . Fibroid uterus 2001  . H/O fatigue 1998  . Headache(784.0)    denies  . Hepatitis    A, 30 years ago  . 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 History of chicken pox   . Hypothyroidism   . Menses, irregular 1998   Past Surgical History:  Procedure Laterality Date  . COLONOSCOPY  02/2021  . DILATION AND CURETTAGE OF UTERUS  03/31/2000   In [place]  . KNEE ARTHROPLASTY Right    Replaced in [place], [place]  . KNEE ARTHROSCOPY Right 2008  . TONSILLECTOMY     As a child  . TOTAL KNEE ARTHROPLASTY Left 08/21/2022   Procedure: LEFT TOTAL KNEE ARTHROPLASTY;  Surgeon: Kathryne Hitch, MD;  Location: [facility];  Service: Orthopedics;  Laterality: Left;   Patient Active Problem List   Diagnosis Date Noted  . Status post total left knee replacement 08/21/2022  . Dyspareunia, female 11/13/2011  . Type 2 diabetes mellitus (HCC) 11/13/2011  . Alopecia 11/13/2011  . History of chicken pox   . Headache(784.0)   . Hypothyroidism   . Menses, irregular   . Hirsutism   . H/O fatigue   . Fibroid uterus     PCP: Dr Orpah Cobb   REFERRING PROVIDER: Dr Doneen Poisson   REFERRING DIAG:  Diagnosis  [phone] (ICD-10-CM) - Status post left knee replacement    THERAPY DIAG:  Difficulty in walking, not elsewhere classified  Muscle weakness (generalized)  Acute pain of left knee  Rationale for Evaluation and Treatment: Rehabilitation  ONSET DATE: 08/21/2022  SUBJECTIVE:   SUBJECTIVE STATEMENT: Pt is 5 weeks and 5 days s/p L TKR.  It is very stiff to bend when I walk. Swollen and skin feels like it is going to crack. Using walker at night.   PERTINENT HISTORY: Right TKA, diabetes, anxiety, vertigo PAIN:  Are you having pain? Yes: NPRS scale: 0/10 Pain location: left knee  Pain description: stiffness  Aggravating factors: Standing and walking, certain positionins  Relieving factors:    PRECAUTIONS: None  WEIGHT BEARING RESTRICTIONS: Yes WBAT   FALLS:  Has patient fallen in last 6 months? No  LIVING ENVIRONMENT: 4 steps into her house  OCCUPATION:   PLOF: Independent  PATIENT GOALS:   To have less pain in her knee and her back   OBJECTIVE:   DIAGNOSTIC FINDINGS: Nothing  post-op   PATIENT SURVEYS:  6/15: LEFS 28  PALPATION: 6/15: edema around Lt knee compared to Rt  LOWER EXTREMITY ROM:  Passive ROM Left eval Left 6/15 Left 6/27  Hip flexion     Hip extension     Hip abduction     Hip adduction     Hip internal rotation     Hip external rotation     Knee flexion 87 87 108  Knee extension -5 0 0 with overpressure  Ankle dorsiflexion     Ankle plantarflexion     Ankle inversion     Ankle eversion      (Blank rows = not tested)  LOWER EXTREMITY MMT:  MMT (lb) Right 6/15 Left 6/15  Hip flexion    Hip extension    Hip abduction    Hip adduction    Hip internal rotation    Hip external rotation    Knee flexion 25.5 18.7  Knee extension 33.5 13.7   (Blank rows = not tested) not tested secondary to recent surgery and significant lower back  pain    GAIT: Eval: Walking with stiff leg.  Knee not unlocking during heel strike to toe off phase.  Mild side-to-side movement.  Mild forward flexion leaning onto walker.  Therapy advised patient to try to walk more in the walker and upright to take stress off her back.  TODAY'S TREATMENT:                                                                                                                               09/30/2022 L knee AROM/PROM:   Treatment                            7/1:  Nu step L3 5 min Gait training- working on bending knee through swing through; stopping in bw to do standing hamstring curls, toe off to knee drive from toe off to encourage great toe ext Sidelying bicycle- broken up for HEP SL ball bw knees, knee flexion SL hip abd, hip circles Supine SLR- cues for full ext/quad set, without touching heel to table Supine bridge with ball bw knees  MANUAL: prone Rt upper sacral quadrant PA spring hold, STM Lt glut max/med   6/27  NuStep: 5 minutes level 3  Manual: Passive range of motion into flexion and extension.  Grade 1 and 2 PA and AP mobilizations.  Trigger point release to IT band and hamstrings to improve extension.  Patellar mobilization.  Straight leg raise 3 x 10 patient reports that she uses 5 pound weight at home patient advised to be cautious of her back with 5 pounds straight leg raise  Long arc quad 5 pounds 3 x 15  Gait: Ambulated with 1 crutch on right side.  Contact-guard assist with gait belt used.  Patient uses a stiff leg but that is to be expected at this  point.  She was advised as her strength improves she will available unlocked her knee more effectively with ambulation.  No significant loss of balance with use of single-point cane  Standing: Slow march with right knee for left knee loading 3 x 10 Treatment                            6/24:  Bike 10 min Gait with pushing walker away and then walking without device Sit<>stand with ball  bw knees, to fatigue 6" step taps Gait with high steps with walker   Treatment                            6/22:  Nu step 5 min L4 LE only Cone taps- hands on and off of walker 2" step ups Alt LAQ- with red band around both ankles to engage bil LEs Gait with SPC around clinic with gait belt- cues for toe off and knee flexion in swing through Stepping over hurdles alternating feet Recumb bike 5 min   PATIENT EDUCATION:  Education details: Anatomy of condition, POC, HEP, exercise form/rationale Person educated: Patient Education method: Explanation, Demonstration, Tactile cues, Verbal cues, and Handouts Education comprehension: verbalized understanding, returned demonstration, verbal cues required, tactile cues required, and needs further education  HOME EXERCISE PROGRAM: Access Code: [email or web address] URL: [email or web address]  TID amb 5-10 min with high steps and pushing walker away.    ASSESSMENT:  CLINICAL IMPRESSION: Continues to improve in gait confidence. CGA for safety but pt does not require assistance for stability in gait. Manual work to lower back necessary to reduce tightness from hip hike as compensation pattern for lack of knee flexion in swing through.    OBJECTIVE IMPAIRMENTS: Abnormal gait, decreased activity tolerance, decreased knowledge of condition, decreased knowledge of use of DME, decreased mobility, difficulty walking, decreased ROM, decreased strength, and pain.   ACTIVITY LIMITATIONS: carrying, lifting, bending, sitting, squatting, stairs, transfers, and locomotion level  PARTICIPATION LIMITATIONS: meal prep, cleaning, laundry, driving, shopping, community activity, and yard work  PERSONAL FACTORS: 1-2 comorbidities: Right TKA, significant lower back pain  are also affecting patient's functional outcome.   REHAB POTENTIAL: Good  CLINICAL DECISION MAKING: Evolving/moderate complexity significant lower back pain limiting patient's  function  EVALUATION COMPLEXITY: Moderate   GOALS: Goals reviewed with patient? Yes  SHORT TERM GOALS: Target date: 10/08/2022   Patient will increase passive left knee flexion to 120 degrees Baseline: Goal status: INITIAL  2.  Patient will demonstrate full active left knee extension Baseline:  Goal status: INITIAL  3.  Patient will progress to least restrictive assistive device with ambulation distance of 300 feet without significant pain and with good safety Baseline:  Goal status: INITIAL  4.  Patient will be independent with baseline HEP Baseline:  Goal status: INITIAL  5.  Patient will report a 75% reduction in low back pain on the left side Baseline:  Goal status: INITIAL   LONG TERM GOALS: Target date: 11/05/2022    Patient will go up and down 8 steps with reciprocal gait pattern in order to improve community ambulation Baseline:  Goal status: INITIAL  2.  Patient will ambulate 3000 feet in the community without increased pain with least restrictive assistive device in order to go shopping Baseline:  Goal status: INITIAL  3.  Patient will be independent with complete exercise program to promote general health and bilateral knee strength  Baseline:  Goal status: INITIAL    PLAN:  PT FREQUENCY: 3x/week patient will like to do 3 times a week until her low back improves  PT DURATION: 8 weeks  PLANNED INTERVENTIONS: Therapeutic exercises, Therapeutic activity, Neuromuscular re-education, Balance training, Gait training, Patient/Family education, Self Care, Joint mobilization, Stair training, DME instructions, Aquatic Therapy, Dry Needling, Spinal mobilization, Cryotherapy, Moist heat, Taping, Ultrasound, and Manual therapy  PLAN FOR NEXT SESSION: Continue with manual therapy to lower back to reduce pain.  Consider needling when she is outside the surgical window for needling.  Progress to normal TKA activities as back allows.  Review cane training as quad muscle  firing allows.   Jessica C. Hightower PT, DPT 09/29/22 12:39 PM

## 2022-09-29 NOTE — Therapy (Signed)
OUTPATIENT PHYSICAL THERAPY LOWER EXTREMITY EVALUATION   Patient Name: Krystal Lang MRN: [id number] DOB:11/29/1955, 67 y.o., female Today's Date: 09/29/2022  END OF SESSION:  PT End of Session - 09/29/22 1150     Visit Number 7    Number of Visits --   9 vestibular, 24 TKR   Date for PT Re-Evaluation 10/17/22   11/05/22 TKR   Authorization Type MEDICARE PART A AND B    Progress Note Due on Visit 10    PT Start Time 1145    PT Stop Time 1230    PT Time Calculation (min) 45 min    Equipment Utilized During Treatment Gait belt    Activity Tolerance Patient tolerated treatment well    Behavior During Therapy WFL for tasks assessed/performed                Past Medical History:  Diagnosis Date   Anxiety    Arthritis    Cataract    Mixed form OU   Complication of anesthesia    after knee arthroscopy pateint difficult to wake up   Diabetes mellitus    Fibroid uterus 2001   H/O fatigue 1998   Headache(784.0)    denies   Hepatitis    A, 30 years ago   Hirsutism 1998   Idiopathic   History of chicken pox    Hypothyroidism    Menses, irregular 1998   Past Surgical History:  Procedure Laterality Date   COLONOSCOPY  02/2021   DILATION AND CURETTAGE OF UTERUS  03/31/2000   In [place]   KNEE ARTHROPLASTY Right    Replaced in [place], [place]   KNEE ARTHROSCOPY Right 2008   TONSILLECTOMY     As a child   TOTAL KNEE ARTHROPLASTY Left 08/21/2022   Procedure: LEFT TOTAL KNEE ARTHROPLASTY;  Surgeon: Kathryne Hitch, MD;  Location: [facility];  Service: Orthopedics;  Laterality: Left;   Patient Active Problem List   Diagnosis Date Noted   Status post total left knee replacement 08/21/2022   Dyspareunia, female 11/13/2011   Type 2 diabetes mellitus (HCC) 11/13/2011   Alopecia 11/13/2011   History of chicken pox    Headache(784.0)    Hypothyroidism    Menses, irregular    Hirsutism    H/O fatigue    Fibroid uterus     PCP: Dr Orpah Cobb   REFERRING PROVIDER: Dr  Doneen Poisson   REFERRING DIAG:  Diagnosis  [phone] (ICD-10-CM) - Status post left knee replacement    THERAPY DIAG:  Difficulty in walking, not elsewhere classified  Muscle weakness (generalized)  Acute pain of left knee  Rationale for Evaluation and Treatment: Rehabilitation  ONSET DATE: 08/21/2022  SUBJECTIVE:   SUBJECTIVE STATEMENT: It is very stiff to bend when I walk. Swollen and skin feels like it is going to crack. Using walker at night.   PERTINENT HISTORY: Right TKA, diabetes, anxiety, PAIN:  Are you having pain? Yes: NPRS scale: 0/10 Pain location: left knee  Pain description: stiffness  Aggravating factors: Standing and walking, certain positionins  Relieving factors:    PRECAUTIONS: None  WEIGHT BEARING RESTRICTIONS: Yes WBAT   FALLS:  Has patient fallen in last 6 months? No  LIVING ENVIRONMENT: 4 steps into her house  OCCUPATION:   PLOF: Independent  PATIENT GOALS:   To have less pain in her knee and her back   OBJECTIVE:   DIAGNOSTIC FINDINGS: Nothing post-op   PATIENT SURVEYS:  6/15: LEFS 28  PALPATION: 6/15:  edema around Lt knee compared to Rt  LOWER EXTREMITY ROM:  Passive ROM Left eval Left 6/15 Left 6/27  Hip flexion     Hip extension     Hip abduction     Hip adduction     Hip internal rotation     Hip external rotation     Knee flexion 87 87 108  Knee extension -5 0 0 with overpressure  Ankle dorsiflexion     Ankle plantarflexion     Ankle inversion     Ankle eversion      (Blank rows = not tested)  LOWER EXTREMITY MMT:  MMT (lb) Right 6/15 Left 6/15  Hip flexion    Hip extension    Hip abduction    Hip adduction    Hip internal rotation    Hip external rotation    Knee flexion 25.5 18.7  Knee extension 33.5 13.7   (Blank rows = not tested) not tested secondary to recent surgery and significant lower back pain    GAIT: Eval: Walking with stiff leg.  Knee not unlocking during heel strike to  toe off phase.  Mild side-to-side movement.  Mild forward flexion leaning onto walker.  Therapy advised patient to try to walk more in the walker and upright to take stress off her back.  TODAY'S TREATMENT:                                                                                                                               Treatment                            7/1:  Nu step L3 5 min Gait training- working on bending knee through swing through; stopping in bw to do standing hamstring curls, toe off to knee drive from toe off to encourage great toe ext Sidelying bicycle- broken up for HEP SL ball bw knees, knee flexion SL hip abd, hip circles Supine SLR- cues for full ext/quad set, without touching heel to table Supine bridge with ball bw knees  MANUAL: prone Rt upper sacral quadrant PA spring hold, STM Lt glut max/med   6/27  NuStep: 5 minutes level 3  Manual: Passive range of motion into flexion and extension.  Grade 1 and 2 PA and AP mobilizations.  Trigger point release to IT band and hamstrings to improve extension.  Patellar mobilization.  Straight leg raise 3 x 10 patient reports that she uses 5 pound weight at home patient advised to be cautious of her back with 5 pounds straight leg raise  Long arc quad 5 pounds 3 x 15  Gait: Ambulated with 1 crutch on right side.  Contact-guard assist with gait belt used.  Patient uses a stiff leg but that is to be expected at this point.  She was advised as her strength improves she will available unlocked her knee more effectively with  ambulation.  No significant loss of balance with use of single-point cane  Standing: Slow march with right knee for left knee loading 3 x 10 Treatment                            6/24:  Bike 10 min Gait with pushing walker away and then walking without device Sit<>stand with ball bw knees, to fatigue 6" step taps Gait with high steps with walker   Treatment                            6/22:  Nu  step 5 min L4 LE only Cone taps- hands on and off of walker 2" step ups Alt LAQ- with red band around both ankles to engage bil LEs Gait with SPC around clinic with gait belt- cues for toe off and knee flexion in swing through Stepping over hurdles alternating feet Recumb bike 5 min   PATIENT EDUCATION:  Education details: Anatomy of condition, POC, HEP, exercise form/rationale Person educated: Patient Education method: Explanation, Demonstration, Tactile cues, Verbal cues, and Handouts Education comprehension: verbalized understanding, returned demonstration, verbal cues required, tactile cues required, and needs further education  HOME EXERCISE PROGRAM: Access Code: [email or web address] URL: [email or web address]  TID amb 5-10 min with high steps and pushing walker away.    ASSESSMENT:  CLINICAL IMPRESSION: Continues to improve in gait confidence. CGA for safety but pt does not require assistance for stability in gait. Manual work to lower back necessary to reduce tightness from hip hike as compensation pattern for lack of knee flexion in swing through.    OBJECTIVE IMPAIRMENTS: Abnormal gait, decreased activity tolerance, decreased knowledge of condition, decreased knowledge of use of DME, decreased mobility, difficulty walking, decreased ROM, decreased strength, and pain.   ACTIVITY LIMITATIONS: carrying, lifting, bending, sitting, squatting, stairs, transfers, and locomotion level  PARTICIPATION LIMITATIONS: meal prep, cleaning, laundry, driving, shopping, community activity, and yard work  PERSONAL FACTORS: 1-2 comorbidities: Right TKA, significant lower back pain  are also affecting patient's functional outcome.   REHAB POTENTIAL: Good  CLINICAL DECISION MAKING: Evolving/moderate complexity significant lower back pain limiting patient's function  EVALUATION COMPLEXITY: Moderate   GOALS: Goals reviewed with patient? Yes  SHORT TERM GOALS: Target date:  10/08/2022   Patient will increase passive left knee flexion to 120 degrees Baseline: Goal status: INITIAL  2.  Patient will demonstrate full active left knee extension Baseline:  Goal status: INITIAL  3.  Patient will progress to least restrictive assistive device with ambulation distance of 300 feet without significant pain and with good safety Baseline:  Goal status: INITIAL  4.  Patient will be independent with baseline HEP Baseline:  Goal status: INITIAL  5.  Patient will report a 75% reduction in low back pain on the left side Baseline:  Goal status: INITIAL   LONG TERM GOALS: Target date: 11/05/2022    Patient will go up and down 8 steps with reciprocal gait pattern in order to improve community ambulation Baseline:  Goal status: INITIAL  2.  Patient will ambulate 3000 feet in the community without increased pain with least restrictive assistive device in order to go shopping Baseline:  Goal status: INITIAL  3.  Patient will be independent with complete exercise program to promote general health and bilateral knee strength Baseline:  Goal status: INITIAL    PLAN:  PT FREQUENCY: 3x/week patient will like to do  3 times a week until her low back improves  PT DURATION: 8 weeks  PLANNED INTERVENTIONS: Therapeutic exercises, Therapeutic activity, Neuromuscular re-education, Balance training, Gait training, Patient/Family education, Self Care, Joint mobilization, Stair training, DME instructions, Aquatic Therapy, Dry Needling, Spinal mobilization, Cryotherapy, Moist heat, Taping, Ultrasound, and Manual therapy  PLAN FOR NEXT SESSION: Continue with manual therapy to lower back to reduce pain.  Consider needling when she is outside the surgical window for needling.  Progress to normal TKA activities as back allows.  Review cane training as quad muscle firing allows.   Melitta Tigue C. Oriel Ojo PT, DPT 09/29/22 12:39 PM

## 2022-09-29 NOTE — Therapy (Incomplete)
OUTPATIENT PHYSICAL THERAPY TREATMENT NOTE   Patient Name: Krystal Lang MRN: [id number] DOB:15-Aug-1955, 67 y.o., female Today's Date: 10/01/2022  END OF SESSION:  PT End of Session - 09/30/22 0855     Visit Number 7   for TKR   Number of Visits --   9 vestibular, 24 TKR   Date for PT Re-Evaluation 10/17/22    Authorization Type MEDICARE PART A AND B    Progress Note Due on Visit 10    PT Start Time 0804    PT Stop Time 0848    PT Time Calculation (min) 44 min    Activity Tolerance Patient tolerated treatment well    Behavior During Therapy WFL for tasks assessed/performed                Past Medical History:  Diagnosis Date   Anxiety    Arthritis    Cataract    Mixed form OU   Complication of anesthesia    after knee arthroscopy pateint difficult to wake up   Diabetes mellitus    Fibroid uterus 2001   H/O fatigue 1998   Headache(784.0)    denies   Hepatitis    A, 30 years ago   Hirsutism 1998   Idiopathic   History of chicken pox    Hypothyroidism    Menses, irregular 1998   Past Surgical History:  Procedure Laterality Date   COLONOSCOPY  02/2021   DILATION AND CURETTAGE OF UTERUS  03/31/2000   In [place]   KNEE ARTHROPLASTY Right    Replaced in [place], [place]   KNEE ARTHROSCOPY Right 2008   TONSILLECTOMY     As a child   TOTAL KNEE ARTHROPLASTY Left 08/21/2022   Procedure: LEFT TOTAL KNEE ARTHROPLASTY;  Surgeon: Kathryne Hitch, MD;  Location: [facility];  Service: Orthopedics;  Laterality: Left;   Patient Active Problem List   Diagnosis Date Noted   Status post total left knee replacement 08/21/2022   Dyspareunia, female 11/13/2011   Type 2 diabetes mellitus (HCC) 11/13/2011   Alopecia 11/13/2011   History of chicken pox    Headache(784.0)    Hypothyroidism    Menses, irregular    Hirsutism    H/O fatigue    Fibroid uterus     PCP: Dr Orpah Cobb   REFERRING PROVIDER: Dr Doneen Poisson   REFERRING DIAG:  Diagnosis  [phone]  (ICD-10-CM) - Status post left knee replacement    THERAPY DIAG:  Difficulty in walking, not elsewhere classified  Muscle weakness (generalized)  Acute pain of left knee  Rationale for Evaluation and Treatment: Rehabilitation  ONSET DATE: 08/21/2022  SUBJECTIVE:   SUBJECTIVE STATEMENT: Pt is 5 weeks and 5 days s/p L TKR.  Pt denies any adverse effects after prior Rx.  She had to do some work at home and was tired.  Pt's worst pain is at night.  Pt has transitioned to cane and only uses walker when waking up in the middle of the night to sue the bathroom.  Pt states she is not having dizziness anymore It is very stiff to bend when I walk. Swollen and skin feels like it is going to crack. Using walker at night.   PERTINENT HISTORY: Right TKA, diabetes, anxiety PAIN:  Are you having pain? Yes: NPRS scale: 2-3/10 Pain location: left knee  Pain description: stiffness  Aggravating factors: Standing and walking, certain positionins  Relieving factors:    PRECAUTIONS: None  WEIGHT BEARING RESTRICTIONS: Yes WBAT  FALLS:  Has patient fallen in last 6 months? No  LIVING ENVIRONMENT: 4 steps into her house  OCCUPATION:   PLOF: Independent  PATIENT GOALS:   To have less pain in her knee and her back   OBJECTIVE:   DIAGNOSTIC FINDINGS: Nothing post-op   PATIENT SURVEYS:  6/15: LEFS 28  PALPATION: 6/15: edema around Lt knee compared to Rt  LOWER EXTREMITY ROM:  Passive ROM Left eval Left 6/15 Left 6/27  Hip flexion     Hip extension     Hip abduction     Hip adduction     Hip internal rotation     Hip external rotation     Knee flexion 87 87 108  Knee extension -5 0 0 with overpressure  Ankle dorsiflexion     Ankle plantarflexion     Ankle inversion     Ankle eversion      (Blank rows = not tested)  LOWER EXTREMITY MMT:  MMT (lb) Right 6/15 Left 6/15  Hip flexion    Hip extension    Hip abduction    Hip adduction    Hip internal rotation    Hip  external rotation    Knee flexion 25.5 18.7  Knee extension 33.5 13.7   (Blank rows = not tested) not tested secondary to recent surgery and significant lower back pain    GAIT: Eval: Walking with stiff leg.  Knee not unlocking during heel strike to toe off phase.  Mild side-to-side movement.  Mild forward flexion leaning onto walker.  Therapy advised patient to try to walk more in the walker and upright to take stress off her back.  TODAY'S TREATMENT:                                                                                                                               09/30/2022  Manual Therapy: Grade II PA Jt mobs with knee flexed seated at EOT and 4D patellar mobs f/b L knee flexion and extension PROM in supine  L knee AROM/PROM:  2/0 - 100/107 deg  Therapeutic Exercise: Nustep L2-3 x 5 mins just Les Supine SLR 2x10 Supine heel slides with strap x 10 reps LAQ 2# 3x10 Seated HS curl with RTB 2x10 Step ups 4 inch 2x10 with UE support Mini squats with UE support on rail 2x10  See below for pt education   Treatment                            7/1:  Nu step L3 5 min Gait training- working on bending knee through swing through; stopping in bw to do standing hamstring curls, toe off to knee drive from toe off to encourage great toe ext Sidelying bicycle- broken up for HEP SL ball bw knees, knee flexion SL hip abd, hip circles Supine SLR- cues for full ext/quad set, without touching heel to table Supine  bridge with ball bw knees  MANUAL: prone Rt upper sacral quadrant PA spring hold, STM Lt glut max/med   6/27  NuStep: 5 minutes level 3  Manual: Passive range of motion into flexion and extension.  Grade 1 and 2 PA and AP mobilizations.  Trigger point release to IT band and hamstrings to improve extension.  Patellar mobilization.  Straight leg raise 3 x 10 patient reports that she uses 5 pound weight at home patient advised to be cautious of her back with 5 pounds  straight leg raise  Long arc quad 5 pounds 3 x 15  Gait: Ambulated with 1 crutch on right side.  Contact-guard assist with gait belt used.  Patient uses a stiff leg but that is to be expected at this point.  She was advised as her strength improves she will available unlocked her knee more effectively with ambulation.  No significant loss of balance with use of single-point cane  Standing: Slow march with right knee for left knee loading 3 x 10 Treatment                            6/24:  Bike 10 min Gait with pushing walker away and then walking without device Sit<>stand with ball bw knees, to fatigue 6" step taps Gait with high steps with walker   Treatment                            6/22:  Nu step 5 min L4 LE only Cone taps- hands on and off of walker 2" step ups Alt LAQ- with red band around both ankles to engage bil LEs Gait with SPC around clinic with gait belt- cues for toe off and knee flexion in swing through Stepping over hurdles alternating feet Recumb bike 5 min   PATIENT EDUCATION:  Education details:  PT answered questions and provided reassurance.  Progress, protocol expectations, Anatomy of condition, POC, HEP, and exercise form/rationale. Person educated: Patient Education method:  Explanation, Demonstration, Tactile cues, Verbal cues Education comprehension: verbalized understanding, returned demonstration, verbal cues required, tactile cues required, and needs further education  HOME EXERCISE PROGRAM: Access Code: [email or web address] URL: [email or web address]  TID amb 5-10 min with high steps and pushing walker away.    ASSESSMENT:  CLINICAL IMPRESSION: Pt is progressing with ROM, strength, gait, and mobility.  Pt is improving with gait and is primarily using SPC now.  Pt performed exercises per protocol well with cuing for correct form.  Pt able to perform 4 inch step ups well with UE support.  She had good tolerance with PROM.  Pt responded well to  Rx having no increased pain and no c/o's after Rx.  Pt should continue to benefit from cont skilled PT services per protocol to address ongoing goals, improve ROM and strength, and assist in restoring desired level of function.    OBJECTIVE IMPAIRMENTS: Abnormal gait, decreased activity tolerance, decreased knowledge of condition, decreased knowledge of use of DME, decreased mobility, difficulty walking, decreased ROM, decreased strength, and pain.   ACTIVITY LIMITATIONS: carrying, lifting, bending, sitting, squatting, stairs, transfers, and locomotion level  PARTICIPATION LIMITATIONS: meal prep, cleaning, laundry, driving, shopping, community activity, and yard work  PERSONAL FACTORS: 1-2 comorbidities: Right TKA, significant lower back pain  are also affecting patient's functional outcome.   REHAB POTENTIAL: Good  CLINICAL DECISION MAKING: Evolving/moderate complexity significant lower back pain limiting patient's function  EVALUATION COMPLEXITY: Moderate   GOALS: Goals reviewed with patient? Yes  SHORT TERM GOALS: Target date: 10/08/2022   Patient will increase passive left knee flexion to 120 degrees Baseline: Goal status: INITIAL  2.  Patient will demonstrate full active left knee extension Baseline:  Goal status: INITIAL  3.  Patient will progress to least restrictive assistive device with ambulation distance of 300 feet without significant pain and with good safety Baseline:  Goal status: INITIAL  4.  Patient will be independent with baseline HEP Baseline:  Goal status: INITIAL  5.  Patient will report a 75% reduction in low back pain on the left side Baseline:  Goal status: INITIAL   LONG TERM GOALS: Target date: 11/05/2022    Patient will go up and down 8 steps with reciprocal gait pattern in order to improve community ambulation Baseline:  Goal status: INITIAL  2.  Patient will ambulate 3000 feet in the community without increased pain with least restrictive  assistive device in order to go shopping Baseline:  Goal status: INITIAL  3.  Patient will be independent with complete exercise program to promote general health and bilateral knee strength Baseline:  Goal status: INITIAL    PLAN:  PT FREQUENCY: 3x/week patient will like to do 3 times a week until her low back improves  PT DURATION: 8 weeks  PLANNED INTERVENTIONS: Therapeutic exercises, Therapeutic activity, Neuromuscular re-education, Balance training, Gait training, Patient/Family education, Self Care, Joint mobilization, Stair training, DME instructions, Aquatic Therapy, Dry Needling, Spinal mobilization, Cryotherapy, Moist heat, Taping, Ultrasound, and Manual therapy  PLAN FOR NEXT SESSION: Continue with manual therapy to lower back to reduce pain.  Consider needling when she is outside the surgical window for needling.  Progress to normal TKA activities as back allows.  Review cane training as quad muscle firing allows.   Audie Clear III PT, DPT 10/01/22 7:32 AM

## 2022-09-30 ENCOUNTER — Ambulatory Visit (HOSPITAL_BASED_OUTPATIENT_CLINIC_OR_DEPARTMENT_OTHER): Payer: Medicare Other | Admitting: Physical Therapy

## 2022-09-30 DIAGNOSIS — R262 Difficulty in walking, not elsewhere classified: Secondary | ICD-10-CM

## 2022-09-30 DIAGNOSIS — M6281 Muscle weakness (generalized): Secondary | ICD-10-CM

## 2022-09-30 DIAGNOSIS — M25562 Pain in left knee: Secondary | ICD-10-CM

## 2022-10-01 ENCOUNTER — Encounter (HOSPITAL_BASED_OUTPATIENT_CLINIC_OR_DEPARTMENT_OTHER): Payer: Self-pay | Admitting: Physical Therapy

## 2022-10-04 ENCOUNTER — Encounter (HOSPITAL_BASED_OUTPATIENT_CLINIC_OR_DEPARTMENT_OTHER): Payer: Self-pay | Admitting: Physical Therapy

## 2022-10-04 ENCOUNTER — Ambulatory Visit (HOSPITAL_BASED_OUTPATIENT_CLINIC_OR_DEPARTMENT_OTHER): Payer: Medicare Other | Admitting: Physical Therapy

## 2022-10-04 DIAGNOSIS — R262 Difficulty in walking, not elsewhere classified: Secondary | ICD-10-CM | POA: Diagnosis not present

## 2022-10-04 DIAGNOSIS — M6281 Muscle weakness (generalized): Secondary | ICD-10-CM

## 2022-10-04 DIAGNOSIS — M25562 Pain in left knee: Secondary | ICD-10-CM

## 2022-10-04 NOTE — Therapy (Signed)
OUTPATIENT PHYSICAL THERAPY TREATMENT NOTE   Patient Name: LEYLANY Lang MRN: [id number] DOB:05-Sep-1955, 67 y.o., female Today's Date: 10/04/2022  END OF SESSION:  PT End of Session - 10/04/22 0832     Visit Number 9    Number of Visits --   9 vestibular, 24 TKR   Date for PT Re-Evaluation 10/17/22    Authorization Type MEDICARE PART A AND B    Progress Note Due on Visit 10    PT Start Time 0827    PT Stop Time 0912    PT Time Calculation (min) 45 min    Equipment Utilized During Treatment Gait belt    Activity Tolerance Patient tolerated treatment well    Behavior During Therapy WFL for tasks assessed/performed                Past Medical History:  Diagnosis Date   Anxiety    Arthritis    Cataract    Mixed form OU   Complication of anesthesia    after knee arthroscopy pateint difficult to wake up   Diabetes mellitus    Fibroid uterus 2001   H/O fatigue 1998   Headache(784.0)    denies   Hepatitis    A, 30 years ago   Hirsutism 1998   Idiopathic   History of chicken pox    Hypothyroidism    Menses, irregular 1998   Past Surgical History:  Procedure Laterality Date   COLONOSCOPY  02/2021   DILATION AND CURETTAGE OF UTERUS  03/31/2000   In [place]   KNEE ARTHROPLASTY Right    Replaced in [place], [place]   KNEE ARTHROSCOPY Right 2008   TONSILLECTOMY     As a child   TOTAL KNEE ARTHROPLASTY Left 08/21/2022   Procedure: LEFT TOTAL KNEE ARTHROPLASTY;  Surgeon: Kathryne Hitch, MD;  Location: [facility];  Service: Orthopedics;  Laterality: Left;   Patient Active Problem List   Diagnosis Date Noted   Status post total left knee replacement 08/21/2022   Dyspareunia, female 11/13/2011   Type 2 diabetes mellitus (HCC) 11/13/2011   Alopecia 11/13/2011   History of chicken pox    Headache(784.0)    Hypothyroidism    Menses, irregular    Hirsutism    H/O fatigue    Fibroid uterus     PCP: Dr Orpah Cobb   REFERRING PROVIDER: Dr Doneen Poisson    REFERRING DIAG:  Diagnosis  [phone] (ICD-10-CM) - Status post left knee replacement    THERAPY DIAG:  Difficulty in walking, not elsewhere classified  Muscle weakness (generalized)  Acute pain of left knee  Rationale for Evaluation and Treatment: Rehabilitation  ONSET DATE: 08/21/2022  SUBJECTIVE:   SUBJECTIVE STATEMENT: Pt is 5 weeks and 5 days s/p L TKR.  Pt denies any adverse effects after prior Rx.  She had to do some work at home and was tired.  Pt's worst pain is at night.  Pt has transitioned to cane and only uses walker when waking up in the middle of the night to sue the bathroom.  Pt states she is not having dizziness anymore It is very stiff to bend when I walk. Swollen and skin feels like it is going to crack. Using walker at night.   PERTINENT HISTORY: Right TKA, diabetes, anxiety PAIN:  Are you having pain? Yes: NPRS scale: 2-3/10 Pain location: left knee  Pain description: stiffness  Aggravating factors: Standing and walking, certain positionins  Relieving factors:    PRECAUTIONS: None  WEIGHT BEARING RESTRICTIONS: Yes WBAT   FALLS:  Has patient fallen in last 6 months? No  LIVING ENVIRONMENT: 4 steps into her house  OCCUPATION:   PLOF: Independent  PATIENT GOALS:   To have less pain in her knee and her back   OBJECTIVE:   DIAGNOSTIC FINDINGS: Nothing post-op   PATIENT SURVEYS:  6/15: LEFS 28  PALPATION: 6/15: edema around Lt knee compared to Rt  LOWER EXTREMITY ROM:  Passive ROM Left eval Left 6/15 Left 6/27  Hip flexion     Hip extension     Hip abduction     Hip adduction     Hip internal rotation     Hip external rotation     Knee flexion 87 87 108  Knee extension -5 0 0 with overpressure  Ankle dorsiflexion     Ankle plantarflexion     Ankle inversion     Ankle eversion      (Blank rows = not tested)  LOWER EXTREMITY MMT:  MMT (lb) Right 6/15 Left 6/15  Hip flexion    Hip extension    Hip abduction    Hip  adduction    Hip internal rotation    Hip external rotation    Knee flexion 25.5 18.7  Knee extension 33.5 13.7   (Blank rows = not tested) not tested secondary to recent surgery and significant lower back pain    GAIT: Eval: Walking with stiff leg.  Knee not unlocking during heel strike to toe off phase.  Mild side-to-side movement.  Mild forward flexion leaning onto walker.  Therapy advised patient to try to walk more in the walker and upright to take stress off her back.  TODAY'S TREATMENT:                                                                                                                               Treatment                            7/5:  IASTM around incision Patellar motion Supine HSS with strap Heel slide with strap- PROM to 110 deg; PT applied soft tissue mobs in end range flexion, able to press to 115 deg Prone HS curl, curl to hip ext Gait training for increased knee flexion   09/30/2022  Manual Therapy: Grade II PA Jt mobs with knee flexed seated at EOT and 4D patellar mobs f/b L knee flexion and extension PROM in supine  L knee AROM/PROM:  2/0 - 100/107 deg  Therapeutic Exercise: Nustep L2-3 x 5 mins just Les Supine SLR 2x10 Supine heel slides with strap x 10 reps LAQ 2# 3x10 Seated HS curl with RTB 2x10 Step ups 4 inch 2x10 with UE support Mini squats with UE support on rail 2x10  See below for pt education   Treatment  7/1:  Nu step L3 5 min Gait training- working on bending knee through swing through; stopping in bw to do standing hamstring curls, toe off to knee drive from toe off to encourage great toe ext Sidelying bicycle- broken up for HEP SL ball bw knees, knee flexion SL hip abd, hip circles Supine SLR- cues for full ext/quad set, without touching heel to table Supine bridge with ball bw knees  MANUAL: prone Rt upper sacral quadrant PA spring hold, STM Lt glut max/med   PATIENT EDUCATION:   Education details:  PT answered questions and provided reassurance.  Progress, protocol expectations, Anatomy of condition, POC, HEP, and exercise form/rationale. Person educated: Patient Education method:  Explanation, Demonstration, Tactile cues, Verbal cues Education comprehension: verbalized understanding, returned demonstration, verbal cues required, tactile cues required, and needs further education  HOME EXERCISE PROGRAM: Access Code: [email or web address] URL: [email or web address]  TID amb 5-10 min with high steps and pushing walker away.    ASSESSMENT:  CLINICAL IMPRESSION: Utilized various stepping patterns over hurdle with bouts of gait up and down the hallway for improved knee flexion. Has 2 areas of redness along the incision that she will keep an eye on  until her appt with surgeon on Wed.    OBJECTIVE IMPAIRMENTS: Abnormal gait, decreased activity tolerance, decreased knowledge of condition, decreased knowledge of use of DME, decreased mobility, difficulty walking, decreased ROM, decreased strength, and pain.   ACTIVITY LIMITATIONS: carrying, lifting, bending, sitting, squatting, stairs, transfers, and locomotion level  PARTICIPATION LIMITATIONS: meal prep, cleaning, laundry, driving, shopping, community activity, and yard work  PERSONAL FACTORS: 1-2 comorbidities: Right TKA, significant lower back pain  are also affecting patient's functional outcome.   REHAB POTENTIAL: Good  CLINICAL DECISION MAKING: Evolving/moderate complexity significant lower back pain limiting patient's function  EVALUATION COMPLEXITY: Moderate   GOALS: Goals reviewed with patient? Yes  SHORT TERM GOALS: Target date: 10/08/2022   Patient will increase passive left knee flexion to 120 degrees Baseline: Goal status: INITIAL  2.  Patient will demonstrate full active left knee extension Baseline:  Goal status: INITIAL  3.  Patient will progress to least restrictive assistive device  with ambulation distance of 300 feet without significant pain and with good safety Baseline:  Goal status: INITIAL  4.  Patient will be independent with baseline HEP Baseline:  Goal status: INITIAL  5.  Patient will report a 75% reduction in low back pain on the left side Baseline:  Goal status: INITIAL   LONG TERM GOALS: Target date: 11/05/2022    Patient will go up and down 8 steps with reciprocal gait pattern in order to improve community ambulation Baseline:  Goal status: INITIAL  2.  Patient will ambulate 3000 feet in the community without increased pain with least restrictive assistive device in order to go shopping Baseline:  Goal status: INITIAL  3.  Patient will be independent with complete exercise program to promote general health and bilateral knee strength Baseline:  Goal status: INITIAL    PLAN:  PT FREQUENCY: 3x/week patient will like to do 3 times a week until her low back improves  PT DURATION: 8 weeks  PLANNED INTERVENTIONS: Therapeutic exercises, Therapeutic activity, Neuromuscular re-education, Balance training, Gait training, Patient/Family education, Self Care, Joint mobilization, Stair training, DME instructions, Aquatic Therapy, Dry Needling, Spinal mobilization, Cryotherapy, Moist heat, Taping, Ultrasound, and Manual therapy  PLAN FOR NEXT SESSION: progress note   Jassiah Viviano C. Kamorah Nevils PT, DPT 10/04/22 9:14 AM

## 2022-10-06 ENCOUNTER — Encounter (HOSPITAL_BASED_OUTPATIENT_CLINIC_OR_DEPARTMENT_OTHER): Payer: Self-pay | Admitting: Physical Therapy

## 2022-10-07 ENCOUNTER — Ambulatory Visit (HOSPITAL_BASED_OUTPATIENT_CLINIC_OR_DEPARTMENT_OTHER): Payer: Medicare Other | Admitting: Physical Therapy

## 2022-10-07 ENCOUNTER — Encounter (HOSPITAL_BASED_OUTPATIENT_CLINIC_OR_DEPARTMENT_OTHER): Payer: Self-pay | Admitting: Physical Therapy

## 2022-10-07 DIAGNOSIS — R262 Difficulty in walking, not elsewhere classified: Secondary | ICD-10-CM | POA: Diagnosis not present

## 2022-10-07 DIAGNOSIS — M6281 Muscle weakness (generalized): Secondary | ICD-10-CM

## 2022-10-07 NOTE — Therapy (Signed)
OUTPATIENT PHYSICAL THERAPY TREATMENT NOTE   Patient Name: Krystal Lang MRN: [id number] DOB:September 10, 1955, 67 y.o., female Today's Date: 10/07/2022  END OF SESSION:  PT End of Session - 10/07/22 1015     Visit Number 10    Number of Visits --   9 vestibular, 24 TKR   Date for PT Re-Evaluation 10/17/22    Authorization Type MEDICARE PART A AND B    Progress Note Due on Visit 20    PT Start Time 1015    PT Stop Time 1055    PT Time Calculation (min) 40 min    Equipment Utilized During Treatment Gait belt    Activity Tolerance Patient tolerated treatment well    Behavior During Therapy WFL for tasks assessed/performed                Past Medical History:  Diagnosis Date   Anxiety    Arthritis    Cataract    Mixed form OU   Complication of anesthesia    after knee arthroscopy pateint difficult to wake up   Diabetes mellitus    Fibroid uterus 2001   H/O fatigue 1998   Headache(784.0)    denies   Hepatitis    A, 30 years ago   Hirsutism 1998   Idiopathic   History of chicken pox    Hypothyroidism    Menses, irregular 1998   Past Surgical History:  Procedure Laterality Date   COLONOSCOPY  02/2021   DILATION AND CURETTAGE OF UTERUS  03/31/2000   In [place]   KNEE ARTHROPLASTY Right    Replaced in [place], [place]   KNEE ARTHROSCOPY Right 2008   TONSILLECTOMY     As a child   TOTAL KNEE ARTHROPLASTY Left 08/21/2022   Procedure: LEFT TOTAL KNEE ARTHROPLASTY;  Surgeon: Kathryne Hitch, MD;  Location: [facility];  Service: Orthopedics;  Laterality: Left;   Patient Active Problem List   Diagnosis Date Noted   Status post total left knee replacement 08/21/2022   Dyspareunia, female 11/13/2011   Type 2 diabetes mellitus (HCC) 11/13/2011   Alopecia 11/13/2011   History of chicken pox    Headache(784.0)    Hypothyroidism    Menses, irregular    Hirsutism    H/O fatigue    Fibroid uterus     PCP: Dr Orpah Cobb   REFERRING PROVIDER: Dr Doneen Poisson    REFERRING DIAG:  Diagnosis  [phone] (ICD-10-CM) - Status post left knee replacement    THERAPY DIAG:  Difficulty in walking, not elsewhere classified  Muscle weakness (generalized)  Rationale for Evaluation and Treatment: Rehabilitation  ONSET DATE: 08/21/2022  SUBJECTIVE:   SUBJECTIVE STATEMENT: Progress Note Reporting Period 09/19/2022 to 10/07/2022   See note below for Objective Data and Assessment of Progress/Goals.      Barely using cane at this point.   PERTINENT HISTORY: Right TKA, diabetes, anxiety PAIN:  Are you having pain? Yes: NPRS scale: 2-3/10 Pain location: left knee  Pain description: stiffness  Aggravating factors: Standing and walking, certain positionins  Relieving factors:    PRECAUTIONS: None  WEIGHT BEARING RESTRICTIONS: Yes WBAT   FALLS:  Has patient fallen in last 6 months? No  LIVING ENVIRONMENT: 4 steps into her house  OCCUPATION:   PLOF: Independent  PATIENT GOALS:   To have less pain in her knee and her back   OBJECTIVE:   DIAGNOSTIC FINDINGS: Nothing post-op   PATIENT SURVEYS:  6/15: LEFS 28  PALPATION: 6/15: edema around Lt  knee compared to Rt  LOWER EXTREMITY ROM:  Passive ROM Left eval Left 6/15 Left 6/27  Hip flexion     Hip extension     Hip abduction     Hip adduction     Hip internal rotation     Hip external rotation     Knee flexion 87 87 108  Knee extension -5 0 0 with overpressure  Ankle dorsiflexion     Ankle plantarflexion     Ankle inversion     Ankle eversion      (Blank rows = not tested)  LOWER EXTREMITY MMT:  MMT (lb) Right 6/15 Left 6/15  Hip flexion    Hip extension    Hip abduction    Hip adduction    Hip internal rotation    Hip external rotation    Knee flexion 25.5 18.7  Knee extension 33.5 13.7   (Blank rows = not tested) not tested secondary to recent surgery and significant lower back pain    GAIT: Eval: Walking with stiff leg.  Knee not unlocking during heel  strike to toe off phase.  Mild side-to-side movement.  Mild forward flexion leaning onto walker.  Therapy advised patient to try to walk more in the walker and upright to take stress off her back.  TODAY'S TREATMENT:                                                                                                                               Treatment                            7/9:  Bike 5 min Shuttle; bil leg press 3*25+6, single leg press 2*25 Step up to airex Lateral weight shift on airex Marching on airex- hands on chair in front but lifted frequently, working to weight shift and pause Seated HSS Supine knee flexion with STM to surrounding soft tissue-flexion to 112 deg   Treatment                            7/5:  IASTM around incision Patellar motion Supine HSS with strap Heel slide with strap- PROM to 110 deg; PT applied soft tissue mobs in end range flexion, able to press to 115 deg Prone HS curl, curl to hip ext Gait training for increased knee flexion   09/30/2022  Manual Therapy: Grade II PA Jt mobs with knee flexed seated at EOT and 4D patellar mobs f/b L knee flexion and extension PROM in supine  L knee AROM/PROM:  2/0 - 100/107 deg  Therapeutic Exercise: Nustep L2-3 x 5 mins just Les Supine SLR 2x10 Supine heel slides with strap x 10 reps LAQ 2# 3x10 Seated HS curl with RTB 2x10 Step ups 4 inch 2x10 with UE support Mini squats with UE support on rail 2x10  See below for  pt education   PATIENT EDUCATION:  Education details:  PT answered questions and provided reassurance.  Progress, protocol expectations, Anatomy of condition, POC, HEP, and exercise form/rationale. Person educated: Patient Education method:  Explanation, Demonstration, Tactile cues, Verbal cues Education comprehension: verbalized understanding, returned demonstration, verbal cues required, tactile cues required, and needs further education  HOME EXERCISE PROGRAM: Access Code:  [email or web address] URL: [email or web address]  TID amb 5-10 min with high steps and pushing walker away.    ASSESSMENT:  CLINICAL IMPRESSION: Continued improvement in range of motion of knee joint, limited by tension of skin on the anterior knee.  No signs and symptoms of infection at this point from redness along the incision site.  Patient is progressing very well but is fearful of falling and we will continue to work on confidence and motion.  OBJECTIVE IMPAIRMENTS: Abnormal gait, decreased activity tolerance, decreased knowledge of condition, decreased knowledge of use of DME, decreased mobility, difficulty walking, decreased ROM, decreased strength, and pain.   ACTIVITY LIMITATIONS: carrying, lifting, bending, sitting, squatting, stairs, transfers, and locomotion level  PARTICIPATION LIMITATIONS: meal prep, cleaning, laundry, driving, shopping, community activity, and yard work  PERSONAL FACTORS: 1-2 comorbidities: Right TKA, significant lower back pain  are also affecting patient's functional outcome.   REHAB POTENTIAL: Good  CLINICAL DECISION MAKING: Evolving/moderate complexity significant lower back pain limiting patient's function  EVALUATION COMPLEXITY: Moderate   GOALS: Goals reviewed with patient? Yes  SHORT TERM GOALS: Target date: 10/08/2022   Patient will increase passive left knee flexion to 120 degrees Baseline: max 115 Goal status: ongoing   2.  Patient will demonstrate full active left knee extension Baseline:  Goal status:achieved  3.  Patient will progress to least restrictive assistive device with ambulation distance of 300 feet without significant pain and with good safety Baseline:  Goal status: achieved  4.  Patient will be independent with baseline HEP Baseline:  Goal status: achieved  5.  Patient will report a 75% reduction in low back pain on the left side Baseline: just nagging Goal status:achieved   LONG TERM GOALS: Target date:  11/05/2022    Patient will go up and down 8 steps with reciprocal gait pattern in order to improve community ambulation Baseline:  Goal status: INITIAL  2.  Patient will ambulate 3000 feet in the community without increased pain with least restrictive assistive device in order to go shopping Baseline:  Goal status: INITIAL  3.  Patient will be independent with complete exercise program to promote general health and bilateral knee strength Baseline:  Goal status: INITIAL    PLAN:  PT FREQUENCY: 3x/week patient will like to do 3 times a week until her low back improves  PT DURATION: 8 weeks  PLANNED INTERVENTIONS: Therapeutic exercises, Therapeutic activity, Neuromuscular re-education, Balance training, Gait training, Patient/Family education, Self Care, Joint mobilization, Stair training, DME instructions, Aquatic Therapy, Dry Needling, Spinal mobilization, Cryotherapy, Moist heat, Taping, Ultrasound, and Manual therapy  PLAN FOR NEXT SESSION: progress note   Shivaun Bilello C. Elder Davidian PT, DPT 10/07/22 2:15 PM

## 2022-10-08 ENCOUNTER — Ambulatory Visit (INDEPENDENT_AMBULATORY_CARE_PROVIDER_SITE_OTHER): Payer: Medicare Other | Admitting: Orthopaedic Surgery

## 2022-10-08 ENCOUNTER — Other Ambulatory Visit (INDEPENDENT_AMBULATORY_CARE_PROVIDER_SITE_OTHER): Payer: Medicare Other

## 2022-10-08 DIAGNOSIS — Z96652 Presence of left artificial knee joint: Secondary | ICD-10-CM | POA: Diagnosis not present

## 2022-10-08 MED ORDER — OXYCODONE HCL 5 MG PO TABS: 5.0000 mg | ORAL_TABLET | ORAL | 0 refills | Status: DC | PRN

## 2022-10-08 NOTE — Progress Notes (Signed)
The patient is now about 7 weeks status post a left total knee arthroplasty.  She is actually doing well.  On exam there is swelling to be expected but there is no redness.  Her calf is soft.  Her range of motion is improving significantly with almost full extension and flexion to well past 95 degrees.  I believe this knee flexes better than her right knee that was replaced over a year ago.  She is now pain-free on the right knee.  She is still having pain that wakes her up with the left knee.  She does use her cane ambulating pelvic but not at home.  She did have a long and thorough list of questions including recently being on antibiotics for her mouth.  Her husband is a cardiologist and is with her today as well.  We both feel that she is doing great.  This gave her excellent reassurance.  X-rays of her left knee today show well-seated total knee arthroplasty with no complicating features.  This point we will see her back in 5 weeks but no x-rays are needed.  She will continue therapy.  All questions and concerns were addressed and answered.

## 2022-10-09 ENCOUNTER — Ambulatory Visit (HOSPITAL_BASED_OUTPATIENT_CLINIC_OR_DEPARTMENT_OTHER): Payer: Medicare Other | Admitting: Physical Therapy

## 2022-10-09 ENCOUNTER — Encounter (HOSPITAL_BASED_OUTPATIENT_CLINIC_OR_DEPARTMENT_OTHER): Payer: Self-pay | Admitting: Physical Therapy

## 2022-10-09 DIAGNOSIS — M6281 Muscle weakness (generalized): Secondary | ICD-10-CM

## 2022-10-09 DIAGNOSIS — R262 Difficulty in walking, not elsewhere classified: Secondary | ICD-10-CM

## 2022-10-09 DIAGNOSIS — M25562 Pain in left knee: Secondary | ICD-10-CM

## 2022-10-09 NOTE — Therapy (Signed)
OUTPATIENT PHYSICAL THERAPY TREATMENT NOTE   Patient Name: Krystal Lang MRN: [id number] DOB:[birth year], 67 y.o., female Today's Date: 10/09/2022  END OF SESSION:  PT End of Session - 10/09/22 1436     Visit Number 11    Date for PT Re-Evaluation 10/17/22    Authorization Type MEDICARE PART A AND B    Progress Note Due on Visit 20    PT Start Time 1431    PT Stop Time 1511    PT Time Calculation (min) 40 min    Activity Tolerance Patient tolerated treatment well    Behavior During Therapy WFL for tasks assessed/performed                Past Medical History:  Diagnosis Date   Anxiety    Arthritis    Cataract    Mixed form OU   Complication of anesthesia    after knee arthroscopy pateint difficult to wake up   Diabetes mellitus    Fibroid uterus 2001   H/O fatigue 1998   Headache(784.0)    denies   Hepatitis    A, 30 years ago   Hirsutism 1998   Idiopathic   History of chicken pox    Hypothyroidism    Menses, irregular 1998   Past Surgical History:  Procedure Laterality Date   COLONOSCOPY  02/2021   DILATION AND CURETTAGE OF UTERUS  03/31/2000   In [place]   KNEE ARTHROPLASTY Right    Replaced in [place], [place]   KNEE ARTHROSCOPY Right 2008   TONSILLECTOMY     As a child   TOTAL KNEE ARTHROPLASTY Left 08/21/2022   Procedure: LEFT TOTAL KNEE ARTHROPLASTY;  Surgeon: Kathryne Hitch, MD;  Location: [facility];  Service: Orthopedics;  Laterality: Left;   Patient Active Problem List   Diagnosis Date Noted   Status post total left knee replacement 08/21/2022   Dyspareunia, female 11/13/2011   Type 2 diabetes mellitus (HCC) 11/13/2011   Alopecia 11/13/2011   History of chicken pox    Headache(784.0)    Hypothyroidism    Menses, irregular    Hirsutism    H/O fatigue    Fibroid uterus     PCP: Dr Orpah Cobb   REFERRING PROVIDER: Dr Doneen Poisson   REFERRING DIAG:  Diagnosis  [phone] (ICD-10-CM) - Status post left knee replacement     THERAPY DIAG:  Difficulty in walking, not elsewhere classified  Muscle weakness (generalized)  Acute pain of left knee  Rationale for Evaluation and Treatment: Rehabilitation  ONSET DATE: 08/21/2022  SUBJECTIVE:   SUBJECTIVE STATEMENT: My leg feels tired. Skin is very tight.   PERTINENT HISTORY: Right TKA, diabetes, anxiety PAIN:  Are you having pain? Yes: NPRS scale: 2-3/10 Pain location: left knee  Pain description: stiffness  Aggravating factors: Standing and walking, certain positionins  Relieving factors:    PRECAUTIONS: None  WEIGHT BEARING RESTRICTIONS: Yes WBAT   FALLS:  Has patient fallen in last 6 months? No  LIVING ENVIRONMENT: 4 steps into her house  OCCUPATION:   PLOF: Independent  PATIENT GOALS:   To have less pain in her knee and her back   OBJECTIVE:   DIAGNOSTIC FINDINGS: Nothing post-op   PATIENT SURVEYS:  6/15: LEFS 28  PALPATION: 6/15: edema around Lt knee compared to Rt  LOWER EXTREMITY ROM:  Passive ROM Left eval Left 6/15 Left 6/27  Hip flexion     Hip extension     Hip abduction     Hip  adduction     Hip internal rotation     Hip external rotation     Knee flexion 87 87 108  Knee extension -5 0 0 with overpressure  Ankle dorsiflexion     Ankle plantarflexion     Ankle inversion     Ankle eversion      (Blank rows = not tested)  LOWER EXTREMITY MMT:  MMT (lb) Right 6/15 Left 6/15 Left   Hip flexion     Hip extension     Hip abduction     Hip adduction     Hip internal rotation     Hip external rotation     Knee flexion 25.5 18.7   Knee extension 33.5 13.7    (Blank rows = not tested) not tested secondary to recent surgery and significant lower back pain    GAIT: Eval: Walking with stiff leg.  Knee not unlocking during heel strike to toe off phase.  Mild side-to-side movement.  Mild forward flexion leaning onto walker.  Therapy advised patient to try to walk more in the walker and upright to take  stress off her back. 7/11: good clearance in bil swing through- slightly less on left than right, holding SPC but not touching ground  TODAY'S TREATMENT:                                                                                                                               Treatment                            7/11:  Bike 7 min Lateral step ecc lower Stairs training, reciprocal Shuttle 3*25 double leg press x20; 2*25 3x10 single leg press Gastroc stretch standing Manual ISTM & skin rolling over patella    Treatment                            7/9:  Bike 5 min Shuttle; bil leg press 3*25+6, single leg press 2*25 Step up to airex Lateral weight shift on airex Marching on airex- hands on chair in front but lifted frequently, working to weight shift and pause Seated HSS Supine knee flexion with STM to surrounding soft tissue-flexion to 112 deg   Treatment                            7/5:  IASTM around incision Patellar motion Supine HSS with strap Heel slide with strap- PROM to 110 deg; PT applied soft tissue mobs in end range flexion, able to press to 115 deg Prone HS curl, curl to hip ext Gait training for increased knee flexion    PATIENT EDUCATION:  Education details:  PT answered questions and provided reassurance.  Progress, protocol expectations, Anatomy of condition, POC, HEP, and exercise form/rationale. Person educated: Patient Education method:  Explanation, Demonstration, Tactile cues, Verbal cues Education comprehension: verbalized understanding, returned demonstration, verbal cues required, tactile cues required, and needs further education  HOME EXERCISE PROGRAM: Access Code: [email or web address] URL: [email or web address]  Stairs 3/day  ASSESSMENT:  CLINICAL IMPRESSION: Pt did very well with stairs once confidence was gained. Will continue to address skin tightness over patella and progress gross functional strength.   OBJECTIVE IMPAIRMENTS:  Abnormal gait, decreased activity tolerance, decreased knowledge of condition, decreased knowledge of use of DME, decreased mobility, difficulty walking, decreased ROM, decreased strength, and pain.   ACTIVITY LIMITATIONS: carrying, lifting, bending, sitting, squatting, stairs, transfers, and locomotion level  PARTICIPATION LIMITATIONS: meal prep, cleaning, laundry, driving, shopping, community activity, and yard work  PERSONAL FACTORS: 1-2 comorbidities: Right TKA, significant lower back pain  are also affecting patient's functional outcome.   REHAB POTENTIAL: Good  CLINICAL DECISION MAKING: Evolving/moderate complexity significant lower back pain limiting patient's function  EVALUATION COMPLEXITY: Moderate   GOALS: Goals reviewed with patient? Yes  SHORT TERM GOALS: Target date: 10/08/2022   Patient will increase passive left knee flexion to 120 degrees Baseline: max 115 Goal status: ongoing   2.  Patient will demonstrate full active left knee extension Baseline:  Goal status:achieved  3.  Patient will progress to least restrictive assistive device with ambulation distance of 300 feet without significant pain and with good safety Baseline:  Goal status: achieved  4.  Patient will be independent with baseline HEP Baseline:  Goal status: achieved  5.  Patient will report a 75% reduction in low back pain on the left side Baseline: just nagging Goal status:achieved   LONG TERM GOALS: Target date: 11/05/2022    Patient will go up and down 8 steps with reciprocal gait pattern in order to improve community ambulation Baseline:  Goal status: INITIAL  2.  Patient will ambulate 3000 feet in the community without increased pain with least restrictive assistive device in order to go shopping Baseline:  Goal status: INITIAL  3.  Patient will be independent with complete exercise program to promote general health and bilateral knee strength Baseline:  Goal status:  INITIAL    PLAN:  PT FREQUENCY: 3x/week patient will like to do 3 times a week until her low back improves  PT DURATION: 8 weeks  PLANNED INTERVENTIONS: Therapeutic exercises, Therapeutic activity, Neuromuscular re-education, Balance training, Gait training, Patient/Family education, Self Care, Joint mobilization, Stair training, DME instructions, Aquatic Therapy, Dry Needling, Spinal mobilization, Cryotherapy, Moist heat, Taping, Ultrasound, and Manual therapy  PLAN FOR NEXT SESSION: progress note   Theseus Birnie C. Ottilia Pippenger PT, DPT 10/09/22 3:12 PM

## 2022-10-12 NOTE — Therapy (Signed)
OUTPATIENT PHYSICAL THERAPY TREATMENT NOTE   Patient Name: Krystal Lang MRN: [id number] DOB:[birth year], 67 y.o., female Today's Date: 10/13/2022  END OF SESSION:  PT End of Session - 10/13/22 1020     Visit Number 12    Date for PT Re-Evaluation 10/17/22    Authorization Type MEDICARE PART A AND B    Progress Note Due on Visit 20    PT Start Time 0850    PT Stop Time 0937    PT Time Calculation (min) 47 min    Activity Tolerance Patient tolerated treatment well    Behavior During Therapy WFL for tasks assessed/performed                 Past Medical History:  Diagnosis Date   Anxiety    Arthritis    Cataract    Mixed form OU   Complication of anesthesia    after knee arthroscopy pateint difficult to wake up   Diabetes mellitus    Fibroid uterus 2001   H/O fatigue 1998   Headache(784.0)    denies   Hepatitis    A, 30 years ago   Hirsutism 1998   Idiopathic   History of chicken pox    Hypothyroidism    Menses, irregular 1998   Past Surgical History:  Procedure Laterality Date   COLONOSCOPY  02/2021   DILATION AND CURETTAGE OF UTERUS  03/31/2000   In [place]   KNEE ARTHROPLASTY Right    Replaced in [place], [place]   KNEE ARTHROSCOPY Right 2008   TONSILLECTOMY     As a child   TOTAL KNEE ARTHROPLASTY Left 08/21/2022   Procedure: LEFT TOTAL KNEE ARTHROPLASTY;  Surgeon: Kathryne Hitch, MD;  Location: [facility];  Service: Orthopedics;  Laterality: Left;   Patient Active Problem List   Diagnosis Date Noted   Status post total left knee replacement 08/21/2022   Dyspareunia, female 11/13/2011   Type 2 diabetes mellitus (HCC) 11/13/2011   Alopecia 11/13/2011   History of chicken pox    Headache(784.0)    Hypothyroidism    Menses, irregular    Hirsutism    H/O fatigue    Fibroid uterus     PCP: Dr Orpah Cobb   REFERRING PROVIDER: Dr Doneen Poisson   REFERRING DIAG:  Diagnosis  [phone] (ICD-10-CM) - Status post left knee replacement     THERAPY DIAG:  Difficulty in walking, not elsewhere classified  Muscle weakness (generalized)  Acute pain of left knee  Rationale for Evaluation and Treatment: Rehabilitation  ONSET DATE: 08/21/2022  SUBJECTIVE:   SUBJECTIVE STATEMENT: Pt is 7 weeks and 3 days s/p L TKR.  Pt hasn't been using the cane in her home for the past 10 days.  She is not using the cane outside of her home today.  She already has 1,000 steps this AM.    Pt reports no increased knee pain after prior Rx though reports having lumbar pain 12-15 hours after prior Rx.  Pt states she has had back pain from day 1 of the surgery.  Her back has been bothering her the past few days and she hasn't worked as much on bending her knee due to pain.   PERTINENT HISTORY: Right TKA, diabetes, anxiety PAIN:  Are you having pain? Yes: NPRS scale: 3/10 Pain location: left knee  Pain description: stiffness  Aggravating factors: Standing and walking, certain positionins  Relieving factors:    PRECAUTIONS: None  WEIGHT BEARING RESTRICTIONS: Yes WBAT   FALLS:  Has patient fallen in last 6 months? No  LIVING ENVIRONMENT: 4 steps into her house  OCCUPATION:   PLOF: Independent  PATIENT GOALS:   To have less pain in her knee and her back   OBJECTIVE:   DIAGNOSTIC FINDINGS: Nothing post-op    LOWER EXTREMITY ROM:  Passive ROM Left eval Left 6/15 Left 6/27 Left 7/15  Hip flexion      Hip extension      Hip abduction      Hip adduction      Hip internal rotation      Hip external rotation      Knee flexion 87 87 108   Knee extension -5 0 0 with overpressure -4 deg AROM  Ankle dorsiflexion      Ankle plantarflexion      Ankle inversion      Ankle eversion       (Blank rows = not tested)     TODAY'S TREATMENT:                                                                                                                               Gait Training: Pt ambulated down hallway with verbal and  visual instruction of heel to to gait pattern working on increasing toe off and knee flexion.  PT also provided cuing to slow down in order to focus on proper gait mechanics.  Pt reached out and used the wall for support due to her balance as she slowed down her gait and focused on proper gait.  PT walked beside her as well.  Pt attempted using the [facility] with gait though didn't feel as stable with cane while walking slower.  Pt used the wall as needed instead.    Manual Therapy: Grade II PA Jt mobs with knee flexed seated at EOT and 4D patellar mobs f/b L knee flexion and extension PROM in supine.  PT provided manual OP with knee extension.     Therapeutic Exercise: Bike x 6 mins LAQ 3# 3x10 Seated HS curl with RTB 2x10   See below for pt education  Therapeutic Activity: Pt ascended stairs with a reciprocal gait with bilat UE support on rails and descended the stairs with a step to and step thru gait with bilat UE support on rails.  PATIENT EDUCATION:  Education details:  PT answered questions and provided reassurance.  Gait training.  PT instructed pt to use her cane or other AD at home for support with balance when she practices her walking.  ROM finding, Progress, protocol expectations, Anatomy of condition, POC, HEP, and exercise form/rationale. Person educated: Patient Education method:  Explanation, Demonstration, Tactile cues, Verbal cues Education comprehension: verbalized understanding, returned demonstration, verbal cues required, tactile cues required, and needs further education  HOME EXERCISE PROGRAM: Access Code: [email or web address] URL: [email or web address]  Stairs 3/day  ASSESSMENT:  CLINICAL IMPRESSION: Pt presents to Rx reporting increased knee pain with bending knee and her back  bothering her over the past few days which has limited her home exercises and impacted her walking.  Pt has stopped using the cane at home and entered the clinic without the cane.  Pt  ambulates with decreased knee flexion and toe off on L.  Pt has been progressing well with Wb'ing with gait decreasing her reliance on an AD.  PT worked on improving her gait with increasing her toe off and knee flexion.  PT had pt slow down her walking to focus on proper gait mechanics.  Pt required assistance for balance to slow down her gait and focus on improving toe off and knee flexion.  Pt used the wall for assistance with balance during gait training and tried the [facility].  Pt didn't feel as stable with the [facility] and states it feels different than her cane.  PT spent time answering questions including protocol expectations, expected progress, relevant anatomy, and pt progress.   PT also provided reassurance.  Pt required cuing to slow and control movement with LAQ.  Pt tolerated knee PROM well though did have some pain in her back with extension PROM/manual OP.  She was limited in extension.  Pt is improving with stairs.  She requires bilat UE assist and was able to perform reciprocal gait with ascending steps though has more difficulty with descending stairs.  Pt responded well to Rx stating she had no increased knee or back pain after Rx.  She should continue to benefit from cont skilled PT services to improve ROM, strength, pain, gait, mobility, and function.   OBJECTIVE IMPAIRMENTS: Abnormal gait, decreased activity tolerance, decreased knowledge of condition, decreased knowledge of use of DME, decreased mobility, difficulty walking, decreased ROM, decreased strength, and pain.   ACTIVITY LIMITATIONS: carrying, lifting, bending, sitting, squatting, stairs, transfers, and locomotion level  PARTICIPATION LIMITATIONS: meal prep, cleaning, laundry, driving, shopping, community activity, and yard work  PERSONAL FACTORS: 1-2 comorbidities: Right TKA, significant lower back pain  are also affecting patient's functional outcome.   REHAB POTENTIAL: Good  CLINICAL DECISION MAKING: Evolving/moderate  complexity significant lower back pain limiting patient's function  EVALUATION COMPLEXITY: Moderate   GOALS: Goals reviewed with patient? Yes  SHORT TERM GOALS: Target date: 10/08/2022   Patient will increase passive left knee flexion to 120 degrees Baseline: max 115 Goal status: ongoing   2.  Patient will demonstrate full active left knee extension Baseline:  Goal status:achieved  3.  Patient will progress to least restrictive assistive device with ambulation distance of 300 feet without significant pain and with good safety Baseline:  Goal status: achieved  4.  Patient will be independent with baseline HEP Baseline:  Goal status: achieved  5.  Patient will report a 75% reduction in low back pain on the left side Baseline: just nagging Goal status:achieved   LONG TERM GOALS: Target date: 11/05/2022    Patient will go up and down 8 steps with reciprocal gait pattern in order to improve community ambulation Baseline:  Goal status: INITIAL  2.  Patient will ambulate 3000 feet in the community without increased pain with least restrictive assistive device in order to go shopping Baseline:  Goal status: INITIAL  3.  Patient will be independent with complete exercise program to promote general health and bilateral knee strength Baseline:  Goal status: INITIAL    PLAN:  PT FREQUENCY: 3x/week patient will like to do 3 times a week until her low back improves  PT DURATION: 8 weeks  PLANNED INTERVENTIONS: Therapeutic exercises, Therapeutic  activity, Neuromuscular re-education, Balance training, Gait training, Patient/Family education, Self Care, Joint mobilization, Stair training, DME instructions, Aquatic Therapy, Dry Needling, Spinal mobilization, Cryotherapy, Moist heat, Taping, Ultrasound, and Manual therapy  PLAN FOR NEXT SESSION: progress note.  Cont per TKR protocol.    Audie Clear III PT, DPT 10/13/22 11:52 PM

## 2022-10-13 ENCOUNTER — Encounter: Payer: Medicare Other | Admitting: Orthopaedic Surgery

## 2022-10-13 ENCOUNTER — Ambulatory Visit (HOSPITAL_BASED_OUTPATIENT_CLINIC_OR_DEPARTMENT_OTHER): Payer: Medicare Other | Admitting: Physical Therapy

## 2022-10-13 ENCOUNTER — Encounter (HOSPITAL_BASED_OUTPATIENT_CLINIC_OR_DEPARTMENT_OTHER): Payer: Self-pay | Admitting: Physical Therapy

## 2022-10-13 DIAGNOSIS — M6281 Muscle weakness (generalized): Secondary | ICD-10-CM

## 2022-10-13 DIAGNOSIS — M25562 Pain in left knee: Secondary | ICD-10-CM

## 2022-10-13 DIAGNOSIS — R262 Difficulty in walking, not elsewhere classified: Secondary | ICD-10-CM

## 2022-10-15 ENCOUNTER — Encounter (HOSPITAL_BASED_OUTPATIENT_CLINIC_OR_DEPARTMENT_OTHER): Payer: Medicare Other | Admitting: Physical Therapy

## 2022-10-16 ENCOUNTER — Ambulatory Visit (HOSPITAL_BASED_OUTPATIENT_CLINIC_OR_DEPARTMENT_OTHER): Payer: Medicare Other | Admitting: Physical Therapy

## 2022-10-16 ENCOUNTER — Encounter (HOSPITAL_BASED_OUTPATIENT_CLINIC_OR_DEPARTMENT_OTHER): Payer: Medicare Other | Admitting: Physical Therapy

## 2022-10-16 DIAGNOSIS — R600 Localized edema: Secondary | ICD-10-CM

## 2022-10-16 DIAGNOSIS — R262 Difficulty in walking, not elsewhere classified: Secondary | ICD-10-CM | POA: Diagnosis not present

## 2022-10-16 DIAGNOSIS — M6281 Muscle weakness (generalized): Secondary | ICD-10-CM

## 2022-10-16 DIAGNOSIS — M25662 Stiffness of left knee, not elsewhere classified: Secondary | ICD-10-CM

## 2022-10-16 DIAGNOSIS — M25562 Pain in left knee: Secondary | ICD-10-CM

## 2022-10-16 NOTE — Therapy (Signed)
OUTPATIENT PHYSICAL THERAPY TREATMENT NOTE   Patient Name: Krystal Lang MRN: [id number] DOB:1955-06-27, 67 y.o., female Today's Date: 10/17/2022  END OF SESSION:  PT End of Session - 10/16/22 1305     Visit Number 13    Number of Visits 24    Date for PT Re-Evaluation 12/11/22    Authorization Type MEDICARE PART A AND B    PT Start Time 1145    PT Stop Time 1229    PT Time Calculation (min) 44 min    Activity Tolerance Patient tolerated treatment well    Behavior During Therapy WFL for tasks assessed/performed                  Past Medical History:  Diagnosis Date   Anxiety    Arthritis    Cataract    Mixed form OU   Complication of anesthesia    after knee arthroscopy pateint difficult to wake up   Diabetes mellitus    Fibroid uterus 2001   H/O fatigue 1998   Headache(784.0)    denies   Hepatitis    A, 30 years ago   Hirsutism 1998   Idiopathic   History of chicken pox    Hypothyroidism    Menses, irregular 1998   Past Surgical History:  Procedure Laterality Date   COLONOSCOPY  02/2021   DILATION AND CURETTAGE OF UTERUS  03/31/2000   In [place]   KNEE ARTHROPLASTY Right    Replaced in [place], [place]   KNEE ARTHROSCOPY Right 2008   TONSILLECTOMY     As a child   TOTAL KNEE ARTHROPLASTY Left 08/21/2022   Procedure: LEFT TOTAL KNEE ARTHROPLASTY;  Surgeon: Kathryne Hitch, MD;  Location: [facility];  Service: Orthopedics;  Laterality: Left;   Patient Active Problem List   Diagnosis Date Noted   Status post total left knee replacement 08/21/2022   Dyspareunia, female 11/13/2011   Type 2 diabetes mellitus (HCC) 11/13/2011   Alopecia 11/13/2011   History of chicken pox    Headache(784.0)    Hypothyroidism    Menses, irregular    Hirsutism    H/O fatigue    Fibroid uterus     PCP: Dr Orpah Cobb   REFERRING PROVIDER: Dr Doneen Poisson   REFERRING DIAG:  Diagnosis  [phone] (ICD-10-CM) - Status post left knee replacement    THERAPY  DIAG:  Difficulty in walking, not elsewhere classified  Muscle weakness (generalized)  Acute pain of left knee  Localized edema  Stiffness of left knee, not elsewhere classified  Rationale for Evaluation and Treatment: Rehabilitation  ONSET DATE: 08/21/2022  SUBJECTIVE:   SUBJECTIVE STATEMENT: Patient reports some pain when she is walking. She is walking without a device.    Pt reports no increased knee pain after prior Rx though reports having lumbar pain 12-15 hours after prior Rx.  Pt states she has had back pain from day 1 of the surgery.  Her back has been bothering her the past few days and she hasn't worked as much on bending her knee due to pain.   PERTINENT HISTORY: Right TKA, diabetes, anxiety PAIN:  Are you having pain? Yes: NPRS scale: 3/10 Pain location: left knee  Pain description: stiffness  Aggravating factors: Standing and walking, certain positionins  Relieving factors:    PRECAUTIONS: None  WEIGHT BEARING RESTRICTIONS: Yes WBAT   FALLS:  Has patient fallen in last 6 months? No  LIVING ENVIRONMENT: 4 steps into her house  OCCUPATION:  PLOF: Independent  PATIENT GOALS:   To have less pain in her knee and her back   OBJECTIVE:   DIAGNOSTIC FINDINGS: Nothing post-op    LOWER EXTREMITY ROM:  Passive ROM Left eval Left 6/15 Left 6/27 Left 7/15  Hip flexion      Hip extension      Hip abduction      Hip adduction      Hip internal rotation      Hip external rotation      Knee flexion 87 87 108   Knee extension -5 0 0 with overpressure -4 deg AROM  Ankle dorsiflexion      Ankle plantarflexion      Ankle inversion      Ankle eversion       (Blank rows = not tested)  LOWER EXTREMITY MMT:    MMT Right eval Left eval  Hip flexion 21.0 21.9  Hip extension    Hip abduction 26.4 27.9  Hip adduction    Hip internal rotation    Hip external rotation    Knee flexion    Knee extension 29.5 28.2  Ankle dorsiflexion    Ankle  plantarflexion    Ankle inversion    Ankle eversion     (Blank rows = not tested)    TODAY'S TREATMENT:                                                                                                                              7/18 Manual Therapy: Grade II PA Jt mobs with knee flexed seated Supine  and 4D patellar mobs f/b L knee flexion and extension PROM in supine.  PT provided manual OP with knee extension.  Therapeutic Exercise: Bike x 6 mins LAQ 5# 3x10 SAQ 2x15 5 lbs    Step up tried 6 inch but had to use momentum  Step up 4 inch x20  Lateral step up x20   Took strength measurements and reviewed with the patient.     Last visit:  Gait Training: Pt ambulated down hallway with verbal and visual instruction of heel to to gait pattern working on increasing toe off and knee flexion.  PT also provided cuing to slow down in order to focus on proper gait mechanics.  Pt reached out and used the wall for support due to her balance as she slowed down her gait and focused on proper gait.  PT walked beside her as well.  Pt attempted using the [facility] with gait though didn't feel as stable with cane while walking slower.  Pt used the wall as needed instead.    Manual Therapy: Grade II PA Jt mobs with knee flexed seated at EOT and 4D patellar mobs f/b L knee flexion and extension PROM in supine.  PT provided manual OP with knee extension.     Therapeutic Exercise: Bike x 6 mins LAQ 3# 3x10 Seated HS curl with RTB 2x10   See  below for pt education  Therapeutic Activity: Pt ascended stairs with a reciprocal gait with bilat UE support on rails and descended the stairs with a step to and step thru gait with bilat UE support on rails.  PATIENT EDUCATION:  Education details:  PT answered questions and provided reassurance.  Gait training.  PT instructed pt to use her cane or other AD at home for support with balance when she practices her walking.  ROM finding, Progress, protocol  expectations, Anatomy of condition, POC, HEP, and exercise form/rationale. Person educated: Patient Education method:  Explanation, Demonstration, Tactile cues, Verbal cues Education comprehension: verbalized understanding, returned demonstration, verbal cues required, tactile cues required, and needs further education  HOME EXERCISE PROGRAM: Access Code: [email or web address] URL: [email or web address]  Stairs 3/day  ASSESSMENT:  CLINICAL IMPRESSION: The patient has had set backs with her back and vestibular issues. Despite these set backs she is doing quite well. Her ROM was measured at 3-114. She is walking without a device. She continues to have mild swelling.  Her strength measurements were comparable to the left. She would benefit from continued skilled therapy 2 W8. She was encouraged to continue with her current HEP. It appears to be working well. 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 She did better with a 4 inch step. She was able to do it with less compensation.   OBJECTIVE IMPAIRMENTS: Abnormal gait, decreased activity tolerance, decreased knowledge of condition, decreased knowledge of use of DME, decreased mobility, difficulty walking, decreased ROM, decreased strength, and pain.   ACTIVITY LIMITATIONS: carrying, lifting, bending, sitting, squatting, stairs, transfers, and locomotion level  PARTICIPATION LIMITATIONS: meal prep, cleaning, laundry, driving, shopping, community activity, and yard work  PERSONAL FACTORS: 1-2 comorbidities: Right TKA, significant lower back pain  are also affecting patient's functional outcome.   REHAB POTENTIAL: Good  CLINICAL DECISION MAKING: Evolving/moderate complexity significant lower back pain limiting patient's function  EVALUATION COMPLEXITY: Moderate   GOALS: Goals reviewed with patient? Yes  SHORT TERM GOALS: Target date: 10/08/2022   Patient will increase passive left knee flexion to 120 degrees Baseline: max 115 Goal status:  ongoing   2.  Patient will demonstrate full active left knee extension Baseline:  Goal status:achieved 7/18  3.  Patient will progress to least restrictive assistive device with ambulation distance of 300 feet without significant pain and with good safety Baseline:  Goal status: achieved 7/18  4.  Patient will be independent with baseline HEP Baseline:  Goal status: achieved  5.  Patient will report a 75% reduction in low back pain on the left side Baseline: just nagging Goal status:achieved   LONG TERM GOALS: Target date: 11/05/2022    Patient will go up and down 8 steps with reciprocal gait pattern in order to improve community ambulation Baseline:  Goal status: INITIAL in progress 7/18  2.  Patient will ambulate 3000 feet in the community without increased pain with least restrictive assistive device in order to go shopping Baseline:  Goal status: INITIAL improving 7/18   3.  Patient will be independent with complete exercise program to promote general health and bilateral knee strength Baseline:  Goal status: INITIAL improving 7/18     PLAN:  PT FREQUENCY: 3x/week patient will like to do 3 times a week until her low back improves  PT DURATION: 8 weeks  PLANNED INTERVENTIONS: Therapeutic exercises, Therapeutic activity, Neuromuscular re-education, Balance training, Gait training, Patient/Family education, Self Care, Joint mobilization, Stair training, DME instructions, Aquatic Therapy, Dry Needling, Spinal mobilization, Cryotherapy, Moist  heat, Taping, Ultrasound, and Manual therapy  PLAN FOR NEXT SESSION: progress note.  Cont per TKR protocol.    Lorayne Bender PT DPT 10/17/22 11:14 AM

## 2022-10-17 ENCOUNTER — Encounter (HOSPITAL_BASED_OUTPATIENT_CLINIC_OR_DEPARTMENT_OTHER): Payer: Self-pay | Admitting: Physical Therapy

## 2022-10-17 ENCOUNTER — Encounter (HOSPITAL_BASED_OUTPATIENT_CLINIC_OR_DEPARTMENT_OTHER): Payer: Medicare Other | Admitting: Physical Therapy

## 2022-10-21 ENCOUNTER — Encounter (HOSPITAL_BASED_OUTPATIENT_CLINIC_OR_DEPARTMENT_OTHER): Payer: Self-pay | Admitting: Physical Therapy

## 2022-10-21 ENCOUNTER — Ambulatory Visit (HOSPITAL_BASED_OUTPATIENT_CLINIC_OR_DEPARTMENT_OTHER): Payer: Medicare Other | Admitting: Physical Therapy

## 2022-10-21 DIAGNOSIS — R600 Localized edema: Secondary | ICD-10-CM

## 2022-10-21 DIAGNOSIS — M6281 Muscle weakness (generalized): Secondary | ICD-10-CM

## 2022-10-21 DIAGNOSIS — M25562 Pain in left knee: Secondary | ICD-10-CM

## 2022-10-21 DIAGNOSIS — R262 Difficulty in walking, not elsewhere classified: Secondary | ICD-10-CM

## 2022-10-21 NOTE — Therapy (Signed)
OUTPATIENT PHYSICAL THERAPY TREATMENT NOTE   Patient Name: Krystal Lang MRN: [id number] DOB:Aug 22, 1955, 67 y.o., female Today's Date: 10/21/2022  END OF SESSION:  PT End of Session - 10/21/22 1018     Visit Number 14    Number of Visits 24    Date for PT Re-Evaluation 12/11/22    Authorization Type MEDICARE PART A AND B    Progress Note Due on Visit 20    PT Start Time 1015    PT Stop Time 1055    PT Time Calculation (min) 40 min    Activity Tolerance Patient tolerated treatment well    Behavior During Therapy WFL for tasks assessed/performed                  Past Medical History:  Diagnosis Date   Anxiety    Arthritis    Cataract    Mixed form OU   Complication of anesthesia    after knee arthroscopy pateint difficult to wake up   Diabetes mellitus    Fibroid uterus 2001   H/O fatigue 1998   Headache(784.0)    denies   Hepatitis    A, 30 years ago   Hirsutism 1998   Idiopathic   History of chicken pox    Hypothyroidism    Menses, irregular 1998   Past Surgical History:  Procedure Laterality Date   COLONOSCOPY  02/2021   DILATION AND CURETTAGE OF UTERUS  03/31/2000   In [place]   KNEE ARTHROPLASTY Right    Replaced in [place], [place]   KNEE ARTHROSCOPY Right 2008   TONSILLECTOMY     As a child   TOTAL KNEE ARTHROPLASTY Left 08/21/2022   Procedure: LEFT TOTAL KNEE ARTHROPLASTY;  Surgeon: Kathryne Hitch, MD;  Location: [facility];  Service: Orthopedics;  Laterality: Left;   Patient Active Problem List   Diagnosis Date Noted   Status post total left knee replacement 08/21/2022   Dyspareunia, female 11/13/2011   Type 2 diabetes mellitus (HCC) 11/13/2011   Alopecia 11/13/2011   History of chicken pox    Headache(784.0)    Hypothyroidism    Menses, irregular    Hirsutism    H/O fatigue    Fibroid uterus     PCP: Dr Orpah Cobb   REFERRING PROVIDER: Dr Doneen Poisson   REFERRING DIAG:  Diagnosis  [phone] (ICD-10-CM) - Status post  left knee replacement    THERAPY DIAG:  Difficulty in walking, not elsewhere classified  Muscle weakness (generalized)  Acute pain of left knee  Localized edema  Rationale for Evaluation and Treatment: Rehabilitation  ONSET DATE: 08/21/2022  SUBJECTIVE:   SUBJECTIVE STATEMENT: Patient reports some pain when she is walking. She is walking without a device.    Pt reports no increased knee pain after prior Rx though reports having lumbar pain 12-15 hours after prior Rx.  Pt states she has had back pain from day 1 of the surgery.  Her back has been bothering her the past few days and she hasn't worked as much on bending her knee due to pain.   PERTINENT HISTORY: Right TKA, diabetes, anxiety PAIN:  Are you having pain? Yes: NPRS scale: 3/10 Pain location: left knee  Pain description: stiffness  Aggravating factors: Standing and walking, certain positionins  Relieving factors:    PRECAUTIONS: None  WEIGHT BEARING RESTRICTIONS: Yes WBAT   FALLS:  Has patient fallen in last 6 months? No  LIVING ENVIRONMENT: 4 steps into her house  OCCUPATION:  PLOF: Independent  PATIENT GOALS:   To have less pain in her knee and her back   OBJECTIVE:   DIAGNOSTIC FINDINGS: Nothing post-op    LOWER EXTREMITY ROM:  Passive ROM Left eval Left 6/15 Left 6/27 Left 7/15  Hip flexion      Hip extension      Hip abduction      Hip adduction      Hip internal rotation      Hip external rotation      Knee flexion 87 87 108   Knee extension -5 0 0 with overpressure -4 deg AROM  Ankle dorsiflexion      Ankle plantarflexion      Ankle inversion      Ankle eversion       (Blank rows = not tested)  LOWER EXTREMITY MMT:    MMT Right eval Left eval  Hip flexion 21.0 21.9  Hip extension    Hip abduction 26.4 27.9  Hip adduction    Hip internal rotation    Hip external rotation    Knee flexion    Knee extension 29.5 28.2  Ankle dorsiflexion    Ankle plantarflexion     Ankle inversion    Ankle eversion     (Blank rows = not tested)    TODAY'S TREATMENT:                                                                                                                              Treatment                            7/23:  Bike 8 min no resist Manual edema mobilization and rolling to Lt knee SLR from bolster , also added abd arc Alt heel slide with posterior pelvic tilt Pelvic tilt to bridge, socks on feet to increase HS challenge Hooklying clam green tband + pelvic tilt   PATIENT EDUCATION:  Education details:  PT answered questions and provided reassurance.  Gait training.  PT instructed pt to use her cane or other AD at home for support with balance when she practices her walking.  ROM finding, Progress, protocol expectations, Anatomy of condition, POC, HEP, and exercise form/rationale. Person educated: Patient Education method:  Explanation, Demonstration, Tactile cues, Verbal cues Education comprehension: verbalized understanding, returned demonstration, verbal cues required, tactile cues required, and needs further education  HOME EXERCISE PROGRAM: Access Code: [email or web address] URL: [email or web address]  Stairs 3/day  ASSESSMENT:  CLINICAL IMPRESSION: Edema is soft at sup-lat aspect of knee and moves well with elevated mobilization. Began focusing on addition of core stability to strength and ROM exercises.   OBJECTIVE IMPAIRMENTS: Abnormal gait, decreased activity tolerance, decreased knowledge of condition, decreased knowledge of use of DME, decreased mobility, difficulty walking, decreased ROM, decreased strength, and pain.   ACTIVITY LIMITATIONS: carrying, lifting, bending, sitting, squatting, stairs, transfers, and locomotion level  PARTICIPATION LIMITATIONS: meal prep,  cleaning, laundry, driving, shopping, community activity, and yard work  PERSONAL FACTORS: 1-2 comorbidities: Right TKA, significant lower back pain  are also  affecting patient's functional outcome.   REHAB POTENTIAL: Good  CLINICAL DECISION MAKING: Evolving/moderate complexity significant lower back pain limiting patient's function  EVALUATION COMPLEXITY: Moderate   GOALS: Goals reviewed with patient? Yes  SHORT TERM GOALS: Target date: 10/08/2022   Patient will increase passive left knee flexion to 120 degrees Baseline: max 115 Goal status: ongoing   2.  Patient will demonstrate full active left knee extension Baseline:  Goal status:achieved 7/18  3.  Patient will progress to least restrictive assistive device with ambulation distance of 300 feet without significant pain and with good safety Baseline:  Goal status: achieved 7/18  4.  Patient will be independent with baseline HEP Baseline:  Goal status: achieved  5.  Patient will report a 75% reduction in low back pain on the left side Baseline: just nagging Goal status:achieved   LONG TERM GOALS: Target date: 11/05/2022    Patient will go up and down 8 steps with reciprocal gait pattern in order to improve community ambulation Baseline:  Goal status: INITIAL in progress 7/18  2.  Patient will ambulate 3000 feet in the community without increased pain with least restrictive assistive device in order to go shopping Baseline:  Goal status: INITIAL improving 7/18   3.  Patient will be independent with complete exercise program to promote general health and bilateral knee strength Baseline:  Goal status: INITIAL improving 7/18     PLAN:  PT FREQUENCY: 3x/week patient will like to do 3 times a week until her low back improves  PT DURATION: 8 weeks  PLANNED INTERVENTIONS: Therapeutic exercises, Therapeutic activity, Neuromuscular re-education, Balance training, Gait training, Patient/Family education, Self Care, Joint mobilization, Stair training, DME instructions, Aquatic Therapy, Dry Needling, Spinal mobilization, Cryotherapy, Moist heat, Taping, Ultrasound, and Manual  therapy  PLAN FOR NEXT SESSION: progress note.  Cont per TKR protocol.   Jakell Trusty C. Harl Wiechmann PT, DPT 10/21/22 10:56 AM

## 2022-10-23 ENCOUNTER — Ambulatory Visit (HOSPITAL_BASED_OUTPATIENT_CLINIC_OR_DEPARTMENT_OTHER): Payer: Medicare Other | Admitting: Physical Therapy

## 2022-10-23 ENCOUNTER — Encounter (HOSPITAL_BASED_OUTPATIENT_CLINIC_OR_DEPARTMENT_OTHER): Payer: Self-pay | Admitting: Physical Therapy

## 2022-10-23 ENCOUNTER — Telehealth: Payer: Self-pay

## 2022-10-23 DIAGNOSIS — M6281 Muscle weakness (generalized): Secondary | ICD-10-CM

## 2022-10-23 DIAGNOSIS — R262 Difficulty in walking, not elsewhere classified: Secondary | ICD-10-CM | POA: Diagnosis not present

## 2022-10-23 NOTE — Telephone Encounter (Signed)
Patient scheduled for Monday.

## 2022-10-23 NOTE — Telephone Encounter (Signed)
Patient called wanting to come in for five minutes to talk to blackman about her surgery 2months ago and now she is bleeding wanting to know if its normal. I tried to transfer her to triage and she refuse and said she needs to talk to blackman for 5 minutes.CB#[phone]

## 2022-10-23 NOTE — Therapy (Signed)
OUTPATIENT PHYSICAL THERAPY TREATMENT NOTE   Patient Name: Krystal Lang MRN: [id number] DOB:[birth year], 67 y.o., female Today's Date: 10/23/2022  END OF SESSION:  PT End of Session - 10/23/22 1445     Visit Number 15    Number of Visits 24    Date for PT Re-Evaluation 12/11/22    Authorization Type MEDICARE PART A AND B    Progress Note Due on Visit 20    PT Start Time 1445    PT Stop Time 1527    PT Time Calculation (min) 42 min    Activity Tolerance Patient tolerated treatment well    Behavior During Therapy WFL for tasks assessed/performed                  Past Medical History:  Diagnosis Date   Anxiety    Arthritis    Cataract    Mixed form OU   Complication of anesthesia    after knee arthroscopy pateint difficult to wake up   Diabetes mellitus    Fibroid uterus 2001   H/O fatigue 1998   Headache(784.0)    denies   Hepatitis    A, 30 years ago   Hirsutism 1998   Idiopathic   History of chicken pox    Hypothyroidism    Menses, irregular 1998   Past Surgical History:  Procedure Laterality Date   COLONOSCOPY  02/2021   DILATION AND CURETTAGE OF UTERUS  03/31/2000   In [place]   KNEE ARTHROPLASTY Right    Replaced in [place], [place]   KNEE ARTHROSCOPY Right 2008   TONSILLECTOMY     As a child   TOTAL KNEE ARTHROPLASTY Left 08/21/2022   Procedure: LEFT TOTAL KNEE ARTHROPLASTY;  Surgeon: Kathryne Hitch, MD;  Location: [facility];  Service: Orthopedics;  Laterality: Left;   Patient Active Problem List   Diagnosis Date Noted   Status post total left knee replacement 08/21/2022   Dyspareunia, female 11/13/2011   Type 2 diabetes mellitus (HCC) 11/13/2011   Alopecia 11/13/2011   History of chicken pox    Headache(784.0)    Hypothyroidism    Menses, irregular    Hirsutism    H/O fatigue    Fibroid uterus     PCP: Dr Orpah Cobb   REFERRING PROVIDER: Dr Doneen Poisson   REFERRING DIAG:  Diagnosis  [phone] (ICD-10-CM) - Status post  left knee replacement    THERAPY DIAG:  Difficulty in walking, not elsewhere classified  Muscle weakness (generalized)  Rationale for Evaluation and Treatment: Rehabilitation  ONSET DATE: 08/21/2022  SUBJECTIVE:   SUBJECTIVE STATEMENT: The swelling bothers me. I am still not walking normally.    PERTINENT HISTORY: Right TKA, diabetes, anxiety PAIN:  Are you having pain? Yes: NPRS scale: 0/10- it is stiff, it hurts some in the back Pain location: left knee  Pain description: stiffness  Aggravating factors: Standing and walking, certain positionins  Relieving factors: not bending   PRECAUTIONS: None  WEIGHT BEARING RESTRICTIONS: Yes WBAT   FALLS:  Has patient fallen in last 6 months? No  LIVING ENVIRONMENT: 4 steps into her house  OCCUPATION:   PLOF: Independent  PATIENT GOALS:   To have less pain in her knee and her back   OBJECTIVE:   DIAGNOSTIC FINDINGS: Nothing post-op    LOWER EXTREMITY ROM:  Passive ROM Left eval Left 6/15 Left 6/27 Left 7/15  Hip flexion      Hip extension      Hip abduction  Hip adduction      Hip internal rotation      Hip external rotation      Knee flexion 87 87 108   Knee extension -5 0 0 with overpressure -4 deg AROM  Ankle dorsiflexion      Ankle plantarflexion      Ankle inversion      Ankle eversion       (Blank rows = not tested)  LOWER EXTREMITY MMT:    MMT Right eval Left eval  Hip flexion 21.0 21.9  Hip extension    Hip abduction 26.4 27.9  Hip adduction    Hip internal rotation    Hip external rotation    Knee flexion    Knee extension 29.5 28.2  Ankle dorsiflexion    Ankle plantarflexion    Ankle inversion    Ankle eversion     (Blank rows = not tested)    TODAY'S TREATMENT:                                                                                                                              Treatment                            7/25:  Bike 5 min L1 [place] edema over  knee Sit<>stand ball bw knees, added OH reach in stand Seated LAQ with ball bw knees, hands on hips for core contraction Core engagement with alt heel slide Bridge with ball bw knees Roller Lt hip Lt clams, straight leg hip abd Stairs- glut activation to step up   Treatment                            7/23:  Bike 8 min no resist Manual edema mobilization and rolling to Lt knee SLR from bolster , also added abd arc Alt heel slide with posterior pelvic tilt Pelvic tilt to bridge, socks on feet to increase HS challenge Hooklying clam green tband + pelvic tilt   PATIENT EDUCATION:  Education details:  PT answered questions and provided reassurance.  Gait training.  PT instructed pt to use her cane or other AD at home for support with balance when she practices her walking.  ROM finding, Progress, protocol expectations, Anatomy of condition, POC, HEP, and exercise form/rationale. Person educated: Patient Education method:  Explanation, Demonstration, Tactile cues, Verbal cues Education comprehension: verbalized understanding, returned demonstration, verbal cues required, tactile cues required, and needs further education  HOME EXERCISE PROGRAM: Access Code: [email or web address] URL: [email or web address]  Stairs 3/day  ASSESSMENT:  CLINICAL IMPRESSION: Excellent progression of range of motion.  Patient continues to complain of anterior lateral knee pain and navigating steps and stairs but does have limited strength in her quads and this should continue to improve as strength progresses.  Reevaluate at next visit to consider extension of plan of care.  OBJECTIVE  IMPAIRMENTS: Abnormal gait, decreased activity tolerance, decreased knowledge of condition, decreased knowledge of use of DME, decreased mobility, difficulty walking, decreased ROM, decreased strength, and pain.   ACTIVITY LIMITATIONS: carrying, lifting, bending, sitting, squatting, stairs, transfers, and locomotion  level  PARTICIPATION LIMITATIONS: meal prep, cleaning, laundry, driving, shopping, community activity, and yard work  PERSONAL FACTORS: 1-2 comorbidities: Right TKA, significant lower back pain  are also affecting patient's functional outcome.   REHAB POTENTIAL: Good  CLINICAL DECISION MAKING: Evolving/moderate complexity significant lower back pain limiting patient's function  EVALUATION COMPLEXITY: Moderate   GOALS: Goals reviewed with patient? Yes  SHORT TERM GOALS: Target date: 10/08/2022   Patient will increase passive left knee flexion to 120 degrees Baseline: max 115 Goal status: ongoing   2.  Patient will demonstrate full active left knee extension Baseline:  Goal status:achieved 7/18  3.  Patient will progress to least restrictive assistive device with ambulation distance of 300 feet without significant pain and with good safety Baseline:  Goal status: achieved 7/18  4.  Patient will be independent with baseline HEP Baseline:  Goal status: achieved  5.  Patient will report a 75% reduction in low back pain on the left side Baseline: just nagging Goal status:achieved   LONG TERM GOALS: Target date: 11/05/2022    Patient will go up and down 8 steps with reciprocal gait pattern in order to improve community ambulation Baseline:  Goal status: INITIAL in progress 7/18  2.  Patient will ambulate 3000 feet in the community without increased pain with least restrictive assistive device in order to go shopping Baseline:  Goal status: INITIAL improving 7/18   3.  Patient will be independent with complete exercise program to promote general health and bilateral knee strength Baseline:  Goal status: INITIAL improving 7/18     PLAN:  PT FREQUENCY: 3x/week patient will like to do 3 times a week until her low back improves  PT DURATION: 8 weeks  PLANNED INTERVENTIONS: Therapeutic exercises, Therapeutic activity, Neuromuscular re-education, Balance training, Gait  training, Patient/Family education, Self Care, Joint mobilization, Stair training, DME instructions, Aquatic Therapy, Dry Needling, Spinal mobilization, Cryotherapy, Moist heat, Taping, Ultrasound, and Manual therapy  PLAN FOR NEXT SESSION: progress note.  Cont per TKR protocol.   Prentiss Polio C. Chason Mciver PT, DPT 10/23/22 3:31 PM

## 2022-10-27 ENCOUNTER — Encounter: Payer: Self-pay | Admitting: Orthopaedic Surgery

## 2022-10-27 ENCOUNTER — Ambulatory Visit (INDEPENDENT_AMBULATORY_CARE_PROVIDER_SITE_OTHER): Payer: Medicare Other | Admitting: Orthopaedic Surgery

## 2022-10-27 DIAGNOSIS — Z96652 Presence of left artificial knee joint: Secondary | ICD-10-CM

## 2022-10-27 NOTE — Progress Notes (Signed)
The patient comes in today for continued follow-up as a relates to her left total knee arthroplasty.  She wanted to make sure the incision was doing well.  She is in therapy.  She would like her gait to be better.  She is an active 67 year old female.  Exam her knee actually looks great today.  She did show me some pictures that I explained can have a knee look like that after surgery but today's knee looks great.  There are some varicose veins but otherwise the incision is healed over nicely.  There is no evidence infection.  She does not show severe pain.  There is no hue of redness around her knee or cellulitis.  There is actually only mild swelling.  Her extension and flexion look good.  This gave her a lot of reassurance that she is making good progress.  Therapy can still work on her range of motion as well as strengthening and gait with balance and coordination.  Will have her keep her regular follow-up appoint with [place] on August 15.  No x-rays are needed.

## 2022-10-28 ENCOUNTER — Ambulatory Visit (HOSPITAL_BASED_OUTPATIENT_CLINIC_OR_DEPARTMENT_OTHER): Payer: Medicare Other | Admitting: Physical Therapy

## 2022-10-28 DIAGNOSIS — R262 Difficulty in walking, not elsewhere classified: Secondary | ICD-10-CM | POA: Diagnosis not present

## 2022-10-28 DIAGNOSIS — M6281 Muscle weakness (generalized): Secondary | ICD-10-CM

## 2022-10-28 NOTE — Therapy (Signed)
OUTPATIENT PHYSICAL THERAPY TREATMENT NOTE   Patient Name: Krystal Lang MRN: [id number] DOB:February 20, 1956, 67 y.o., female Today's Date: 10/28/2022  END OF SESSION:  PT End of Session - 10/28/22 1020     Visit Number 16    Number of Visits 24    Date for PT Re-Evaluation 12/11/22    Authorization Type MEDICARE PART A AND B    Progress Note Due on Visit 20    PT Start Time 1020    PT Stop Time 1058    PT Time Calculation (min) 38 min    Activity Tolerance Patient tolerated treatment well    Behavior During Therapy WFL for tasks assessed/performed                  Past Medical History:  Diagnosis Date   Anxiety    Arthritis    Cataract    Mixed form OU   Complication of anesthesia    after knee arthroscopy pateint difficult to wake up   Diabetes mellitus    Fibroid uterus 2001   H/O fatigue 1998   Headache(784.0)    denies   Hepatitis    A, 30 years ago   Hirsutism 1998   Idiopathic   History of chicken pox    Hypothyroidism    Menses, irregular 1998   Past Surgical History:  Procedure Laterality Date   COLONOSCOPY  02/2021   DILATION AND CURETTAGE OF UTERUS  03/31/2000   In [place]   KNEE ARTHROPLASTY Right    Replaced in [place], [place]   KNEE ARTHROSCOPY Right 2008   TONSILLECTOMY     As a child   TOTAL KNEE ARTHROPLASTY Left 08/21/2022   Procedure: LEFT TOTAL KNEE ARTHROPLASTY;  Surgeon: Kathryne Hitch, MD;  Location: [facility];  Service: Orthopedics;  Laterality: Left;   Patient Active Problem List   Diagnosis Date Noted   Status post total left knee replacement 08/21/2022   Dyspareunia, female 11/13/2011   Type 2 diabetes mellitus (HCC) 11/13/2011   Alopecia 11/13/2011   History of chicken pox    Headache(784.0)    Hypothyroidism    Menses, irregular    Hirsutism    H/O fatigue    Fibroid uterus     PCP: Dr Orpah Cobb   REFERRING PROVIDER: Dr Doneen Poisson   REFERRING DIAG:  Diagnosis  [phone] (ICD-10-CM) - Status post  left knee replacement    THERAPY DIAG:  Difficulty in walking, not elsewhere classified  Muscle weakness (generalized)  Rationale for Evaluation and Treatment: Rehabilitation  ONSET DATE: 08/21/2022  SUBJECTIVE:   SUBJECTIVE STATEMENT: I went to the gym but only did about 8 min on bike.    PERTINENT HISTORY: Right TKA, diabetes, anxiety PAIN:  Are you having pain? Yes: NPRS scale: 0/10- it is stiff, it hurts some in the back Pain location: left knee  Pain description: stiffness  Aggravating factors: Standing and walking, certain positionins  Relieving factors: not bending   PRECAUTIONS: None  WEIGHT BEARING RESTRICTIONS: Yes WBAT   FALLS:  Has patient fallen in last 6 months? No  LIVING ENVIRONMENT: 4 steps into her house  OCCUPATION:   PLOF: Independent  PATIENT GOALS:   To have less pain in her knee and her back   OBJECTIVE:   DIAGNOSTIC FINDINGS: Nothing post-op    LOWER EXTREMITY ROM:  Passive ROM Left eval Left 6/15 Left 6/27 Left 7/15  Hip flexion      Hip extension  Hip abduction      Hip adduction      Hip internal rotation      Hip external rotation      Knee flexion 87 87 108   Knee extension -5 0 0 with overpressure -4 deg AROM  Ankle dorsiflexion      Ankle plantarflexion      Ankle inversion      Ankle eversion       (Blank rows = not tested)  LOWER EXTREMITY MMT:    MMT Right eval Left eval Left 7/30  Hip flexion 21.0 21.9   Hip extension     Hip abduction 26.4 27.9   Hip adduction     Hip internal rotation     Hip external rotation     Knee flexion     Knee extension 29.5 28.2 24.9 post workout  Ankle dorsiflexion     Ankle plantarflexion     Ankle inversion     Ankle eversion      (Blank rows = not tested)    TODAY'S TREATMENT:                                                                                                                              Treatment                            7/30:  Xride L7 5  min Leg press 40lb 5x5 HS curl 25lb 2x10 Knee ext 20lb 2x10 Seated HS stretch, hold 3 breaths x2 each   Treatment                            7/25:  Bike 5 min L1 Ktape edema over knee Sit<>stand ball bw knees, added OH reach in stand Seated LAQ with ball bw knees, hands on hips for core contraction Core engagement with alt heel slide Bridge with ball bw knees Roller Lt hip Lt clams, straight leg hip abd Stairs- glut activation to step up   Treatment                            7/23:  Bike 8 min no resist Manual edema mobilization and rolling to Lt knee SLR from bolster , also added abd arc Alt heel slide with posterior pelvic tilt Pelvic tilt to bridge, socks on feet to increase HS challenge Hooklying clam green tband + pelvic tilt   PATIENT EDUCATION:  Education details:  PT answered questions and provided reassurance.  Gait training.  PT instructed pt to use her cane or other AD at home for support with balance when she practices her walking.  ROM finding, Progress, protocol expectations, Anatomy of condition, POC, HEP, and exercise form/rationale. Person educated: Patient Education method:  Explanation, Demonstration, Tactile cues, Verbal cues Education comprehension: verbalized understanding, returned demonstration,  verbal cues required, tactile cues required, and needs further education  HOME EXERCISE PROGRAM: Access Code: [email or web address] URL: [email or web address]  Stairs 3/day  ASSESSMENT:  CLINICAL IMPRESSION: Will do aquatic appt next Tuesday- pt does not know how to swim but we discussed the benefits and environment which she agreed to try.   OBJECTIVE IMPAIRMENTS: Abnormal gait, decreased activity tolerance, decreased knowledge of condition, decreased knowledge of use of DME, decreased mobility, difficulty walking, decreased ROM, decreased strength, and pain.   ACTIVITY LIMITATIONS: carrying, lifting, bending, sitting, squatting, stairs, transfers,  and locomotion level  PARTICIPATION LIMITATIONS: meal prep, cleaning, laundry, driving, shopping, community activity, and yard work  PERSONAL FACTORS: 1-2 comorbidities: Right TKA, significant lower back pain  are also affecting patient's functional outcome.   REHAB POTENTIAL: Good  CLINICAL DECISION MAKING: Evolving/moderate complexity significant lower back pain limiting patient's function  EVALUATION COMPLEXITY: Moderate   GOALS: Goals reviewed with patient? Yes  SHORT TERM GOALS: Target date: 10/08/2022   Patient will increase passive left knee flexion to 120 degrees Baseline: max 115 Goal status: ongoing   2.  Patient will demonstrate full active left knee extension Baseline:  Goal status:achieved 7/18  3.  Patient will progress to least restrictive assistive device with ambulation distance of 300 feet without significant pain and with good safety Baseline:  Goal status: achieved 7/18  4.  Patient will be independent with baseline HEP Baseline:  Goal status: achieved  5.  Patient will report a 75% reduction in low back pain on the left side Baseline: just nagging Goal status:achieved   LONG TERM GOALS: Target date: 11/05/2022    Patient will go up and down 8 steps with reciprocal gait pattern in order to improve community ambulation Baseline:  Goal status: INITIAL in progress 7/18  2.  Patient will ambulate 3000 feet in the community without increased pain with least restrictive assistive device in order to go shopping Baseline:  Goal status: INITIAL improving 7/18   3.  Patient will be independent with complete exercise program to promote general health and bilateral knee strength Baseline:  Goal status: INITIAL improving 7/18     PLAN:  PT FREQUENCY: 3x/week patient will like to do 3 times a week until her low back improves  PT DURATION: 8 weeks  PLANNED INTERVENTIONS: Therapeutic exercises, Therapeutic activity, Neuromuscular re-education, Balance  training, Gait training, Patient/Family education, Self Care, Joint mobilization, Stair training, DME instructions, Aquatic Therapy, Dry Needling, Spinal mobilization, Cryotherapy, Moist heat, Taping, Ultrasound, and Manual therapy  PLAN FOR NEXT SESSION: progress note.  Cont per TKR protocol.   Stefana Lodico C. Darianne Muralles PT, DPT 10/28/22 10:58 AM

## 2022-10-30 ENCOUNTER — Ambulatory Visit (HOSPITAL_BASED_OUTPATIENT_CLINIC_OR_DEPARTMENT_OTHER): Payer: Medicare Other | Attending: Orthopaedic Surgery | Admitting: Physical Therapy

## 2022-10-30 ENCOUNTER — Encounter (HOSPITAL_BASED_OUTPATIENT_CLINIC_OR_DEPARTMENT_OTHER): Payer: Self-pay | Admitting: Physical Therapy

## 2022-10-30 DIAGNOSIS — M6281 Muscle weakness (generalized): Secondary | ICD-10-CM | POA: Insufficient documentation

## 2022-10-30 DIAGNOSIS — M25562 Pain in left knee: Secondary | ICD-10-CM | POA: Insufficient documentation

## 2022-10-30 DIAGNOSIS — R262 Difficulty in walking, not elsewhere classified: Secondary | ICD-10-CM | POA: Diagnosis present

## 2022-10-30 NOTE — Therapy (Signed)
OUTPATIENT PHYSICAL THERAPY TREATMENT NOTE   Patient Name: Krystal Lang MRN: [id number] DOB:Aug 17, 1955, 67 y.o., female Today's Date: 10/30/2022  END OF SESSION:  PT End of Session - 10/30/22 1432     Visit Number 17    Number of Visits 24    Date for PT Re-Evaluation 12/11/22    Authorization Type MEDICARE PART A AND B    Progress Note Due on Visit 20    PT Start Time 1430    PT Stop Time 1516    PT Time Calculation (min) 46 min    Activity Tolerance Patient tolerated treatment well    Behavior During Therapy WFL for tasks assessed/performed                   Past Medical History:  Diagnosis Date   Anxiety    Arthritis    Cataract    Mixed form OU   Complication of anesthesia    after knee arthroscopy pateint difficult to wake up   Diabetes mellitus    Fibroid uterus 2001   H/O fatigue 1998   Headache(784.0)    denies   Hepatitis    A, 30 years ago   Hirsutism 1998   Idiopathic   History of chicken pox    Hypothyroidism    Menses, irregular 1998   Past Surgical History:  Procedure Laterality Date   COLONOSCOPY  02/2021   DILATION AND CURETTAGE OF UTERUS  03/31/2000   In [place]   KNEE ARTHROPLASTY Right    Replaced in [place], [place]   KNEE ARTHROSCOPY Right 2008   TONSILLECTOMY     As a child   TOTAL KNEE ARTHROPLASTY Left 08/21/2022   Procedure: LEFT TOTAL KNEE ARTHROPLASTY;  Surgeon: Kathryne Hitch, MD;  Location: [facility];  Service: Orthopedics;  Laterality: Left;   Patient Active Problem List   Diagnosis Date Noted   Status post total left knee replacement 08/21/2022   Dyspareunia, female 11/13/2011   Type 2 diabetes mellitus (HCC) 11/13/2011   Alopecia 11/13/2011   History of chicken pox    Headache(784.0)    Hypothyroidism    Menses, irregular    Hirsutism    H/O fatigue    Fibroid uterus     PCP: Dr Orpah Cobb   REFERRING PROVIDER: Dr Doneen Poisson   REFERRING DIAG:  Diagnosis  [phone] (ICD-10-CM) - Status post  left knee replacement    THERAPY DIAG:  Difficulty in walking, not elsewhere classified  Muscle weakness (generalized)  Acute pain of left knee  Rationale for Evaluation and Treatment: Rehabilitation  ONSET DATE: 08/21/2022  SUBJECTIVE:   SUBJECTIVE STATEMENT: Back of the knee hurts.    PERTINENT HISTORY: Right TKA, diabetes, anxiety PAIN:  Are you having pain? Yes: NPRS scale: 0/10- it is stiff, it hurts some in the back Pain location: left knee  Pain description: stiffness  Aggravating factors: Standing and walking, certain positionins  Relieving factors: not bending   PRECAUTIONS: None  WEIGHT BEARING RESTRICTIONS: Yes WBAT   FALLS:  Has patient fallen in last 6 months? No  LIVING ENVIRONMENT: 4 steps into her house  OCCUPATION:   PLOF: Independent  PATIENT GOALS:   To have less pain in her knee and her back   OBJECTIVE:   DIAGNOSTIC FINDINGS: Nothing post-op    LOWER EXTREMITY ROM:  Passive ROM Left eval Left 6/15 Left 6/27 Left 7/15  Hip flexion      Hip extension      Hip  abduction      Hip adduction      Hip internal rotation      Hip external rotation      Knee flexion 87 87 108   Knee extension -5 0 0 with overpressure -4 deg AROM  Ankle dorsiflexion      Ankle plantarflexion      Ankle inversion      Ankle eversion       (Blank rows = not tested)  LOWER EXTREMITY MMT:    MMT Right eval Left eval Left 7/30  Hip flexion 21.0 21.9   Hip extension     Hip abduction 26.4 27.9   Hip adduction     Hip internal rotation     Hip external rotation     Knee flexion     Knee extension 29.5 28.2 24.9 post workout  Ankle dorsiflexion     Ankle plantarflexion     Ankle inversion     Ankle eversion      (Blank rows = not tested)    TODAY'S TREATMENT:                                                                                                                              Treatment                            8/1:  Bike 7 min  1 Prone STM gastroc & post knee; bil glut med & QL Supine figure 4 rocking side to side Supine leg lengthener Sit to stand- 1ct stand, 5ct sit SLS   Treatment                            7/30:  Xride L7 5 min Leg press 40lb 5x5 HS curl 25lb 2x10 Knee ext 20lb 2x10 Seated HS stretch, hold 3 breaths x2 each   Treatment                            7/25:  Bike 5 min L1 Ktape edema over knee Sit<>stand ball bw knees, added OH reach in stand Seated LAQ with ball bw knees, hands on hips for core contraction Core engagement with alt heel slide Bridge with ball bw knees Roller Lt hip Lt clams, straight leg hip abd Stairs- glut activation to step up    PATIENT EDUCATION:  Education details:  PT answered questions and provided reassurance.  Gait training.  PT instructed pt to use her cane or other AD at home for support with balance when she practices her walking.  ROM finding, Progress, protocol expectations, Anatomy of condition, POC, HEP, and exercise form/rationale. Person educated: Patient Education method:  Explanation, Demonstration, Tactile cues, Verbal cues Education comprehension: verbalized understanding, returned demonstration, verbal cues required, tactile cues required, and needs further education  HOME EXERCISE  PROGRAM: Access Code: [email or web address] URL: [email or web address]  Stairs 3/day   ASSESSMENT:  CLINICAL IMPRESSION: S/s of hypersensitivity at distal 1/4 of incision site- discussed desensitization. Progressing very well. Is frustrated by swelling and tightness so we discussed the soreness associated with progressions as well as edema. She did walk around [employer] yesterday which is likely contributing to her back and knee soreness today.   OBJECTIVE IMPAIRMENTS: Abnormal gait, decreased activity tolerance, decreased knowledge of condition, decreased knowledge of use of DME, decreased mobility, difficulty walking, decreased ROM, decreased strength, and  pain.   ACTIVITY LIMITATIONS: carrying, lifting, bending, sitting, squatting, stairs, transfers, and locomotion level  PARTICIPATION LIMITATIONS: meal prep, cleaning, laundry, driving, shopping, community activity, and yard work  PERSONAL FACTORS: 1-2 comorbidities: Right TKA, significant lower back pain  are also affecting patient's functional outcome.   REHAB POTENTIAL: Good  CLINICAL DECISION MAKING: Evolving/moderate complexity significant lower back pain limiting patient's function  EVALUATION COMPLEXITY: Moderate   GOALS: Goals reviewed with patient? Yes  SHORT TERM GOALS: Target date: 10/08/2022   Patient will increase passive left knee flexion to 120 degrees Baseline: max 115 Goal status: ongoing   2.  Patient will demonstrate full active left knee extension Baseline:  Goal status:achieved 7/18  3.  Patient will progress to least restrictive assistive device with ambulation distance of 300 feet without significant pain and with good safety Baseline:  Goal status: achieved 7/18  4.  Patient will be independent with baseline HEP Baseline:  Goal status: achieved  5.  Patient will report a 75% reduction in low back pain on the left side Baseline: just nagging Goal status:achieved   LONG TERM GOALS: Target date: 11/05/2022    Patient will go up and down 8 steps with reciprocal gait pattern in order to improve community ambulation Baseline:  Goal status: INITIAL in progress 7/18  2.  Patient will ambulate 3000 feet in the community without increased pain with least restrictive assistive device in order to go shopping Baseline:  Goal status: INITIAL improving 7/18   3.  Patient will be independent with complete exercise program to promote general health and bilateral knee strength Baseline:  Goal status: INITIAL improving 7/18     PLAN:  PT FREQUENCY: 3x/week patient will like to do 3 times a week until her low back improves  PT DURATION: 8  weeks  PLANNED INTERVENTIONS: Therapeutic exercises, Therapeutic activity, Neuromuscular re-education, Balance training, Gait training, Patient/Family education, Self Care, Joint mobilization, Stair training, DME instructions, Aquatic Therapy, Dry Needling, Spinal mobilization, Cryotherapy, Moist heat, Taping, Ultrasound, and Manual therapy  PLAN FOR NEXT SESSION: progress as tolerated  Kariann Wecker C. Yordy Matton PT, DPT 10/30/22 3:18 PM

## 2022-11-03 NOTE — Therapy (Signed)
OUTPATIENT PHYSICAL THERAPY TREATMENT NOTE   Patient Name: Krystal Lang MRN: [id number] DOB:Jul 03, 1955, 67 y.o., female Today's Date: 11/04/2022  END OF SESSION:  PT End of Session - 11/04/22 1144     Visit Number 18    Number of Visits 24    Date for PT Re-Evaluation 12/11/22    Authorization Type MEDICARE PART A AND B    Progress Note Due on Visit 20    PT Start Time 1144    PT Stop Time 1231    PT Time Calculation (min) 47 min    Activity Tolerance Patient tolerated treatment well    Behavior During Therapy WFL for tasks assessed/performed                    Past Medical History:  Diagnosis Date   Anxiety    Arthritis    Cataract    Mixed form OU   Complication of anesthesia    after knee arthroscopy pateint difficult to wake up   Diabetes mellitus    Fibroid uterus 2001   H/O fatigue 1998   Headache(784.0)    denies   Hepatitis    A, 30 years ago   Hirsutism 1998   Idiopathic   History of chicken pox    Hypothyroidism    Menses, irregular 1998   Past Surgical History:  Procedure Laterality Date   COLONOSCOPY  02/2021   DILATION AND CURETTAGE OF UTERUS  03/31/2000   In [place]   KNEE ARTHROPLASTY Right    Replaced in [place], [place]   KNEE ARTHROSCOPY Right 2008   TONSILLECTOMY     As a child   TOTAL KNEE ARTHROPLASTY Left 08/21/2022   Procedure: LEFT TOTAL KNEE ARTHROPLASTY;  Surgeon: Kathryne Hitch, MD;  Location: [facility];  Service: Orthopedics;  Laterality: Left;   Patient Active Problem List   Diagnosis Date Noted   Status post total left knee replacement 08/21/2022   Dyspareunia, female 11/13/2011   Type 2 diabetes mellitus (HCC) 11/13/2011   Alopecia 11/13/2011   History of chicken pox    Headache(784.0)    Hypothyroidism    Menses, irregular    Hirsutism    H/O fatigue    Fibroid uterus     PCP: Dr Orpah Cobb   REFERRING PROVIDER: Dr Doneen Poisson   REFERRING DIAG:  Diagnosis  [phone] (ICD-10-CM) - Status  post left knee replacement    THERAPY DIAG:  Difficulty in walking, not elsewhere classified  Muscle weakness (generalized)  Rationale for Evaluation and Treatment: Rehabilitation  ONSET DATE: 08/21/2022  SUBJECTIVE:   SUBJECTIVE STATEMENT: My knee is very stiff from all of the swelling   PERTINENT HISTORY: Right TKA, diabetes, anxiety PAIN:  Are you having pain? Yes: NPRS scale: 0/10- it is stiff, it hurts some in the back Pain location: left knee  Pain description: stiffness  Aggravating factors: Standing and walking, certain positionins  Relieving factors: not bending   PRECAUTIONS: None  WEIGHT BEARING RESTRICTIONS: Yes WBAT   FALLS:  Has patient fallen in last 6 months? No  LIVING ENVIRONMENT: 4 steps into her house  OCCUPATION:   PLOF: Independent  PATIENT GOALS:   To have less pain in her knee and her back   OBJECTIVE:   DIAGNOSTIC FINDINGS: Nothing post-op    LOWER EXTREMITY ROM:  Passive ROM Left eval Left 6/15 Left 6/27 Left 7/15  Hip flexion      Hip extension      Hip abduction  Hip adduction      Hip internal rotation      Hip external rotation      Knee flexion 87 87 108   Knee extension -5 0 0 with overpressure -4 deg AROM  Ankle dorsiflexion      Ankle plantarflexion      Ankle inversion      Ankle eversion       (Blank rows = not tested)  LOWER EXTREMITY MMT:    MMT Right eval Left eval Left 7/30  Hip flexion 21.0 21.9   Hip extension     Hip abduction 26.4 27.9   Hip adduction     Hip internal rotation     Hip external rotation     Knee flexion     Knee extension 29.5 28.2 24.9 post workout  Ankle dorsiflexion     Ankle plantarflexion     Ankle inversion     Ankle eversion      (Blank rows = not tested)    TODAY'S TREATMENT:                                                                                                                              Treatment                            8/6:  X-ray Level  3 5 minutes Leg press 40 pounds 3 x 10 Hamstring curl 35 pounds 3 x 10 Knee extension 25 pounds 3 x 10 Slant board 10 heel raise with 10-second stretch by 3 rounds Side-lying hip review-encouraged her to maintain knee extension for stability  Treatment                            8/1:  Bike 7 min 1 Prone STM gastroc & post knee; bil glut med & QL Supine figure 4 rocking side to side Supine leg lengthener Sit to stand- 1ct stand, 5ct sit SLS   Treatment                            7/30:  Xride L7 5 min Leg press 40lb 5x5 HS curl 25lb 2x10 Knee ext 20lb 2x10 Seated HS stretch, hold 3 breaths x2 each    PATIENT EDUCATION:  Education details:  PT answered questions and provided reassurance.  Gait training.  PT instructed pt to use her cane or other AD at home for support with balance when she practices her walking.  ROM finding, Progress, protocol expectations, Anatomy of condition, POC, HEP, and exercise form/rationale. Person educated: Patient Education method:  Explanation, Demonstration, Tactile cues, Verbal cues Education comprehension: verbalized understanding, returned demonstration, verbal cues required, tactile cues required, and needs further education  HOME EXERCISE PROGRAM: Access Code: [email or web address] URL: [email or web address]  Stairs 3/day   ASSESSMENT:  CLINICAL IMPRESSION:  We focused on working in the gym today in order to encourage progression toward long-term strengthening program.  Her strength is improving significantly and she was able to show step ups onto a single layer step but is very nervous about doing stairs.  She did verbalize request to work on getting up and down from the floor to which I told her to continue working on stairs as the quadriceps will provide the majority of the strength in order to get off of the floor.  Patient continues to deny any signs and symptoms of vertigo.    OBJECTIVE IMPAIRMENTS: Abnormal gait, decreased activity  tolerance, decreased knowledge of condition, decreased knowledge of use of DME, decreased mobility, difficulty walking, decreased ROM, decreased strength, and pain.   ACTIVITY LIMITATIONS: carrying, lifting, bending, sitting, squatting, stairs, transfers, and locomotion level  PARTICIPATION LIMITATIONS: meal prep, cleaning, laundry, driving, shopping, community activity, and yard work  PERSONAL FACTORS: 1-2 comorbidities: Right TKA, significant lower back pain  are also affecting patient's functional outcome.   REHAB POTENTIAL: Good  CLINICAL DECISION MAKING: Evolving/moderate complexity significant lower back pain limiting patient's function  EVALUATION COMPLEXITY: Moderate   GOALS: Goals reviewed with patient? Yes  SHORT TERM GOALS: Target date: 10/08/2022   Patient will increase passive left knee flexion to 120 degrees Baseline: max 115 Goal status: ongoing   2.  Patient will demonstrate full active left knee extension Baseline:  Goal status:achieved 7/18  3.  Patient will progress to least restrictive assistive device with ambulation distance of 300 feet without significant pain and with good safety Baseline:  Goal status: achieved 7/18  4.  Patient will be independent with baseline HEP Baseline:  Goal status: achieved  5.  Patient will report a 75% reduction in low back pain on the left side Baseline: just nagging Goal status:achieved   LONG TERM GOALS: Target date: 11/05/2022    Patient will go up and down 8 steps with reciprocal gait pattern in order to improve community ambulation Baseline:  Goal status: INITIAL in progress 7/18  2.  Patient will ambulate 3000 feet in the community without increased pain with least restrictive assistive device in order to go shopping Baseline:  Goal status: INITIAL improving 7/18   3.  Patient will be independent with complete exercise program to promote general health and bilateral knee strength Baseline:  Goal status:  INITIAL improving 7/18     PLAN:  PT FREQUENCY: 3x/week patient will like to do 3 times a week until her low back improves  PT DURATION: 8 weeks  PLANNED INTERVENTIONS: Therapeutic exercises, Therapeutic activity, Neuromuscular re-education, Balance training, Gait training, Patient/Family education, Self Care, Joint mobilization, Stair training, DME instructions, Aquatic Therapy, Dry Needling, Spinal mobilization, Cryotherapy, Moist heat, Taping, Ultrasound, and Manual therapy  PLAN FOR NEXT SESSION: progress as tolerated   C.  PT, DPT 11/04/22 1:23 PM

## 2022-11-04 ENCOUNTER — Ambulatory Visit (HOSPITAL_BASED_OUTPATIENT_CLINIC_OR_DEPARTMENT_OTHER): Payer: Medicare Other | Admitting: Physical Therapy

## 2022-11-04 ENCOUNTER — Encounter (HOSPITAL_BASED_OUTPATIENT_CLINIC_OR_DEPARTMENT_OTHER): Payer: Self-pay | Admitting: Physical Therapy

## 2022-11-04 DIAGNOSIS — R262 Difficulty in walking, not elsewhere classified: Secondary | ICD-10-CM

## 2022-11-04 DIAGNOSIS — M6281 Muscle weakness (generalized): Secondary | ICD-10-CM

## 2022-11-06 ENCOUNTER — Ambulatory Visit (HOSPITAL_BASED_OUTPATIENT_CLINIC_OR_DEPARTMENT_OTHER): Payer: Medicare Other | Admitting: Physical Therapy

## 2022-11-10 ENCOUNTER — Telehealth: Payer: Self-pay | Admitting: Orthopaedic Surgery

## 2022-11-10 NOTE — Telephone Encounter (Signed)
Patient called. Would like someone to call her. She has some questions about possible infection. Cb [phone]

## 2022-11-13 ENCOUNTER — Ambulatory Visit (INDEPENDENT_AMBULATORY_CARE_PROVIDER_SITE_OTHER): Payer: Medicare Other | Admitting: Orthopaedic Surgery

## 2022-11-13 ENCOUNTER — Encounter: Payer: Self-pay | Admitting: Orthopaedic Surgery

## 2022-11-13 DIAGNOSIS — Z96652 Presence of left artificial knee joint: Secondary | ICD-10-CM

## 2022-11-13 NOTE — Progress Notes (Signed)
The patient is now almost 3 months status post a left total knee arthroplasty.  She has had a right knee replaced remotely.  She needs a lot of reassurance that she is doing excellent and she is actually ahead of what most people are at this point.  I am very proud of her.  She is not taking narcotics.  She does wish to go to more physical therapy but this time at [facility].  I gave her a prescription for this.  Her right knee looks great.  Her left knee which is the more recent operative knee shows mild swelling to be expected but the range of motion is great.  She will continue outpatient physical therapy.  From my standpoint the next time I need to see is not for 3 months.  I would like a standing AP and lateral of her left knee at that visit.

## 2022-11-17 ENCOUNTER — Encounter: Payer: Self-pay | Admitting: Orthopaedic Surgery

## 2023-02-19 ENCOUNTER — Ambulatory Visit: Payer: Medicare Other | Admitting: Orthopaedic Surgery

## 2023-02-19 ENCOUNTER — Other Ambulatory Visit (INDEPENDENT_AMBULATORY_CARE_PROVIDER_SITE_OTHER): Payer: Medicare Other

## 2023-02-19 DIAGNOSIS — Z96652 Presence of left artificial knee joint: Secondary | ICD-10-CM

## 2023-02-19 NOTE — Progress Notes (Signed)
The patient is now 6 months status post a left total knee replacement.  She says she still deals with it hurting on a daily basis.  She does have a right knee replacement that was performed elsewhere and I did remind her that she had similar pain issues after her right total knee arthroplasty for quite some time after that surgery.  I explained that it can really take a year or so to fully recover from joint replacement surgery but she is still making some progress.  She has been going to [facility] physical therapy for therapy on her left knee.  She does state the right knee is hurting her now and she is now having significant low back pain and left-sided sciatica and IT band pain.  Both knees look actually really good.  There is no redness.  There is no induration of the skin.  There is no effusion.  Both knees have excellent range of motion and feels stable.  An AP standing and lateral of the left knee shows both her knees have well-seated total knee arthroplasties with no complicating features.  I did give her reassurance that there is no evidence of infection.  I did give her a note to give her physical therapist to work on her lumbar spine with left-sided sciatica and any modalities to treat IT band and trochanteric pain.  I would like to see her back in 4 weeks and if she still having low back pain we would obtain an AP and lateral of her lumbar spine.  Her husband is with her today who is a cardiologist and agrees with this treatment plan as well as the patient.

## 2023-03-03 ENCOUNTER — Ambulatory Visit (HOSPITAL_BASED_OUTPATIENT_CLINIC_OR_DEPARTMENT_OTHER): Payer: Medicare Other

## 2023-03-03 ENCOUNTER — Ambulatory Visit (INDEPENDENT_AMBULATORY_CARE_PROVIDER_SITE_OTHER): Payer: Medicare Other | Admitting: Student

## 2023-03-03 ENCOUNTER — Encounter (HOSPITAL_BASED_OUTPATIENT_CLINIC_OR_DEPARTMENT_OTHER): Payer: Self-pay | Admitting: Student

## 2023-03-03 DIAGNOSIS — G8929 Other chronic pain: Secondary | ICD-10-CM | POA: Diagnosis not present

## 2023-03-03 DIAGNOSIS — M545 Low back pain, unspecified: Secondary | ICD-10-CM

## 2023-03-03 NOTE — Progress Notes (Unsigned)
Chief Complaint: Low back and left hip pain     History of Present Illness:    Krystal Lang is a 67 y.o. female presenting today for evaluation of left-sided low back pain.  She did undergo a left total knee arthroplasty on 08/21/2022 and reports that her back pain began the day of surgery.  Does have record of a previous lumbar MRI which she states was to investigate the cause of her knee pain at the time.  She does continue to have pain in her left knee and continues to work with physical therapy.  Also states her right knee has become bothersome over the past month.  She did recently have an order added on for PT for treatment of the low back and lateral hip pain.  Denies any groin pain, numbness, or tingling.  Pain does radiate around the side of the left hip but does not travel down the leg.  This has been preventing her from walking and increasing her activity levels.   Surgical History:   Left knee TKA -08/21/2022  PMH/PSH/Family History/Social History/Meds/Allergies:    Past Medical History:  Diagnosis Date   Anxiety    Arthritis    Cataract    Mixed form OU   Complication of anesthesia    after knee arthroscopy pateint difficult to wake up   Diabetes mellitus    Fibroid uterus 2001   H/O fatigue 1998   Headache(784.0)    denies   Hepatitis    A, 30 years ago   Hirsutism 1998   Idiopathic   History of chicken pox    Hypothyroidism    Menses, irregular 1998   Past Surgical History:  Procedure Laterality Date   COLONOSCOPY  02/2021   DILATION AND CURETTAGE OF UTERUS  03/31/2000   In [place]   KNEE ARTHROPLASTY Right    Replaced in [place], [place]   KNEE ARTHROSCOPY Right 2008   TONSILLECTOMY     As a child   TOTAL KNEE ARTHROPLASTY Left 08/21/2022   Procedure: LEFT TOTAL KNEE ARTHROPLASTY;  Surgeon: Kathryne Hitch, MD;  Location: [facility];  Service: Orthopedics;  Laterality: Left;   Social History   Socioeconomic History    Marital status: Married    Spouse name: Not on file   Number of children: Not on file   Years of education: Not on file   Highest education level: Not on file  Occupational History   Not on file  Tobacco Use   Smoking status: Never   Smokeless tobacco: Never  Vaping Use   Vaping status: Never Used  Substance and Sexual Activity   Alcohol use: No   Drug use: No   Sexual activity: Yes    Birth control/protection: Post-menopausal  Other Topics Concern   Not on file  Social History Narrative   ** Merged History Encounter **       ** Merged History Encounter **       Social Determinants of Health   Financial Resource Strain: Not on file  Food Insecurity: Low Risk  (05/23/2021)   Received from [facility], [facility]   Food Insecurity    Within the past 12 months, did the food you bought just not last and you didn't have money to get more?: No  Within the past 12 months, did you worry that your food would run out before you got money to buy more?: No  Transportation Needs: Low Risk  (05/23/2021)   Received from [facility], [facility]   Transportation Needs    Lack of transportation: No  Physical Activity: Not on file  Stress: Not on file  Social Connections: Unknown (08/13/2021)   Received from [facility], [facility]   Social Network    Social Network: Not on file   Family History  Problem Relation Age of Onset   Diabetes Mother    Diabetes Father    Heart disease Father        MI   Diabetes Sister    Macular degeneration Sister    Diabetes Brother    Heart disease Brother        Angioplasty   No Known Allergies Current Outpatient Medications  Medication Sig Dispense Refill   lidocaine (LIDODERM) 5 % Place 1 patch onto the skin daily. Remove & Discard patch within 12 hours or as directed by MD 30 patch 0   Ascorbic Acid (VITAMIN C PO) Take 1 tablet by mouth daily.     atorvastatin (LIPITOR) 20  MG tablet Take 20 mg by mouth 3 (three) times a week.     celecoxib (CELEBREX) 200 MG capsule Take 1 capsule (200 mg total) by mouth 2 (two) times daily between meals as needed. 180 capsule 3   diazepam (VALIUM) 5 MG tablet Take 1 tablet (5 mg total) by mouth 2 (two) times daily as needed for anxiety (dizziness). DO NOT take at the same time as your zolpidem (take either or at night) 10 tablet 0   empagliflozin (JARDIANCE) 10 MG TABS tablet Take 10 mg by mouth daily.     glimepiride (AMARYL) 2 MG tablet Take 2 mg by mouth in the morning and at bedtime.     levothyroxine (SYNTHROID) 100 MCG tablet Take 100 mcg by mouth daily before breakfast.     meclizine (ANTIVERT) 25 MG tablet Take 1 tablet (25 mg total) by mouth 3 (three) times daily as needed for dizziness or nausea. 30 tablet 0   methocarbamol (ROBAXIN) 500 MG tablet Take 1 tablet (500 mg total) by mouth every 6 (six) hours as needed for muscle spasms. 60 tablet 0   oxyCODONE (OXY IR/ROXICODONE) 5 MG immediate release tablet Take 1-2 tablets (5-10 mg total) by mouth every 4 (four) hours as needed for moderate pain (pain score 4-6). 30 tablet 0   sitaGLIPtin-metformin (JANUMET) 50-1000 MG tablet Take 1 tablet by mouth 2 (two) times daily with a meal.     Vitamin D, Ergocalciferol, (DRISDOL) 1.25 MG (50000 UNIT) CAPS capsule Take 50,000 Units by mouth every 7 (seven) days.     zolpidem (AMBIEN) 5 MG tablet TAKE 1 TABLET BY MOUTH AT BEDTIME AS NEEDED FOR SLEEP. 10 tablet 0   No current facility-administered medications for this visit.   No results found.  Review of Systems:   A ROS was performed including pertinent positives and negatives as documented in the HPI.  Physical Exam :   Constitutional: NAD and appears stated age Neurological: Alert and oriented Psych: Appropriate affect and cooperative There were no vitals taken for this visit.   Comprehensive Musculoskeletal Exam:    Mild midline tenderness of the lumbar spine with  tenderness noted just lateral to the left SI joint.  No pain with passive flexion, internal, or external rotation of  the left hip.  Pinpoint tenderness to palpation over the left greater trochanter.  Distal neurosensory exam is intact.  Imaging:   Xray (lumbar spine 4 views): Multilevel degenerative disc disease and facet arthropathy without evidence of acute abnormality.   I personally reviewed and interpreted the radiographs.   Assessment:   67 y.o. female with chronic low back pain with an onset just after her left total knee arthroplasty approximately 6 months ago.  I do believe she likely has a component of trochanteric bursitis as she has pinpoint tenderness over this area.  She does complain of more significant pain in the left side of the low back which I do believe is likely emanating from the lumbar spine or musculature as opposed to the SI joint.  Based on patient's recent discussion with Dr. Magnus Ivan, I will plan to place an order for an MRI of the lumbar spine to follow-up on today's x-rays.  I would like for her to continue with physical therapy as tolerated in the meantime.  Will plan to have her return for discussion with Dr. Magnus Ivan and could consider possible referral to Dr. Alvester Morin.  Plan :    -Obtain MRI of the lumbar spine and follow-up with Dr. Magnus Ivan     I personally saw and evaluated the patient, and participated in the management and treatment plan.  Hazle Nordmann, PA-C Orthopedics

## 2023-03-05 ENCOUNTER — Telehealth (HOSPITAL_BASED_OUTPATIENT_CLINIC_OR_DEPARTMENT_OTHER): Payer: Self-pay | Admitting: Student

## 2023-03-05 ENCOUNTER — Other Ambulatory Visit: Payer: Self-pay | Admitting: Orthopaedic Surgery

## 2023-03-05 MED ORDER — GABAPENTIN 300 MG PO CAPS: 300.0000 mg | ORAL_CAPSULE | Freq: Every day | ORAL | 0 refills | Status: DC | PRN

## 2023-03-05 NOTE — Telephone Encounter (Signed)
Patient had requested medication refills of Gabapentin and Zolpidem at recent visit with me.  Dr. Magnus Ivan asked to forward on to him or review.

## 2023-03-09 ENCOUNTER — Ambulatory Visit: Payer: Medicare Other | Admitting: Orthopaedic Surgery

## 2023-03-19 ENCOUNTER — Encounter: Payer: Self-pay | Admitting: Orthopaedic Surgery

## 2023-03-19 ENCOUNTER — Ambulatory Visit: Payer: Medicare Other | Admitting: Orthopaedic Surgery

## 2023-03-19 DIAGNOSIS — Z96652 Presence of left artificial knee joint: Secondary | ICD-10-CM | POA: Diagnosis not present

## 2023-03-19 NOTE — Progress Notes (Signed)
Krystal Lang is now 7 months status post a left total knee arthroplasty.  She is I believe close to 2 years out from a right total knee replacement that was done elsewhere.  Her right knee has been hurting recently.  She has been worried about infection did give her reassurance that I am not worried about her knees being infected.  Both knees are examined today.  Her right knee shows no effusion at all.  There is no redness or induration and her range of motion is full and is ligamentously stable.  There is some lateral tenderness.  Her left knee that is the more recent knee does still have some slight swelling but that is minimal.  Her range of motion is good and her incision looks good.  There is no redness either.  I gave her reassurance that I am not worried about her knees being infected.  I would like to see her back in 3 months and I would like a standing AP and lateral of both knees at that visit.  She does feel like therapy has helped her spine but her knees do hurt with weightbearing.  I still gave her reassurance that this is normal and I do feel aches can get better with time.

## 2023-03-22 ENCOUNTER — Ambulatory Visit
Admission: RE | Admit: 2023-03-22 | Discharge: 2023-03-22 | Disposition: A | Discharge status: Home / Self Care | Payer: Medicare Other | Source: Ambulatory Visit | Attending: Student | Admitting: Student

## 2023-03-22 DIAGNOSIS — G8929 Other chronic pain: Secondary | ICD-10-CM

## 2023-04-13 ENCOUNTER — Telehealth: Payer: Self-pay | Admitting: Orthopaedic Surgery

## 2023-04-13 ENCOUNTER — Ambulatory Visit: Payer: Medicare Other | Admitting: Orthopaedic Surgery

## 2023-04-13 NOTE — Telephone Encounter (Signed)
 Patient called. Would like someone to call her. She is in pain. She wants to come in Thursday.

## 2023-04-14 NOTE — Telephone Encounter (Signed)
 Patient aware of the below message from Dr. Magnus Ivan and we set her up with an appt with Dr. Christell Constant

## 2023-04-17 ENCOUNTER — Ambulatory Visit (INDEPENDENT_AMBULATORY_CARE_PROVIDER_SITE_OTHER): Payer: Medicare Other | Admitting: Orthopedic Surgery

## 2023-04-17 ENCOUNTER — Other Ambulatory Visit (INDEPENDENT_AMBULATORY_CARE_PROVIDER_SITE_OTHER): Payer: Self-pay

## 2023-04-17 VITALS — BP 131/62 | HR 73 | Ht 64.0 in | Wt 162.5 lb

## 2023-04-17 DIAGNOSIS — M545 Low back pain, unspecified: Secondary | ICD-10-CM | POA: Diagnosis not present

## 2023-04-18 NOTE — Progress Notes (Signed)
Orthopedic Spine Surgery Office Note  Assessment: Patient is a 68 y.o. female with low back pain that I suspect is from her L5/S1 facet based on the location and her imaging.  Possible other etiologies would be DDD at L4/5, L5 radiculopathy, her SI joint - but she has no physical exam findings pointing towards SI pathology at this time   Plan: -Patient has tried PT, Tylenol, NSAIDs -I talked about injections as a way to help with diagnosis since there are multiple potential etiologies of her pain.  Most likely at this point seems like her facet so would probably start with that.patient was not interested at this time.  She wanted to try physical therapy so a referral was provided to her today for that -She really wanted to know what could have caused her back pain from getting a knee replacement surgery.  I told her I do not have a great explanation for the connection between knee replacement surgery and development of back pain -Patient should return to office on an as-needed basis   Patient expressed understanding of the plan and all questions were answered to the patient's satisfaction.   ___________________________________________________________________________   History:  Patient is a 68 y.o. female who presents today for lumbar spine.  Patient states that she has undergone bilateral total knee arthroplasties.  After her most recent TKA which was on the left side, she developed low back pain.  She said since that time she has had significant low back pain.  She notices it with even light activities like walking around the neighborhood.  She also has left and right knee pain.  She said she did not have any right knee pain prior to this onset of low back pain.  She felt she was doing well from her right total knee arthroplasty.  She is frustrated because she has not noticed any significant improvement in her back pain since onset.  She also is reporting pain along the lateral aspect of her  left thigh.  It does not radiate past the knee.  She is not having any pain radiating into the right lower extremity.   Weakness: Denies Symptoms of imbalance: Denies Paresthesias and numbness: Denies Bowel or bladder incontinence: Denies Saddle anesthesia: Denies  Treatments tried: PT, Tylenol, NSAIDs  Review of systems: Denies fevers and chills, night sweats, unexplained weight loss, history of cancer.  Has had pain that wakes her at night  Past medical history: Anxiety CKD DM (last A1c was 5.3 on 08/15/2022) Chronic pain Hypothyroidism  Allergies: NKDA  Past surgical history:  Bilateral TKA D&C of uterus Knee arthroscopy Tonsillectomy  Social history: Denies use of nicotine product (smoking, vaping, patches, smokeless) Alcohol use: Denies Denies recreational drug use   Physical Exam:  BMI of 27.9  General: no acute distress, appears stated age Neurologic: alert, answering questions appropriately, following commands Respiratory: unlabored breathing on room air, symmetric chest rise Psychiatric: appropriate affect, normal cadence to speech   MSK (spine):  -Strength exam      Left  Right EHL    5/5  5/5 TA    5/5  5/5 GSC    5/5  5/5 Knee extension  5/5  5/5 Hip flexion   5/5  5/5  -Sensory exam    Sensation intact to light touch in L3-S1 nerve distributions of bilateral lower extremities  -Achilles DTR: 2/4 on the left, 2/4 on the right -Patellar tendon DTR: 2/4 on the left, 2/4 on the right  -Straight leg raise: Negative  bilaterally -Clonus: no beats bilaterally  -Left hip exam: No pain through range of motion, negative Stinchfield, negative FABER, negative SI joint compression test, negative Gaenslen's -Right hip exam: No pain through range of motion, negative Stinchfield, negative FABER, negative SI joint compression test, negative Gaenslen's  Imaging: XRs of the lumbar spine from 04/17/2023 were independently reviewed and interpreted, showing  disc height loss at L4/5.  No other significant degenerative changes seen.  No fracture or dislocation seen.  No evidence of instability on flexion/extension views.  MRI of the lumbar spine from 03/22/2023 was independently reviewed and interpreted, showing DDD at L4/5.  Facet arthropathy at L5/S1, particularly on the left.  Left L5/S1 foraminal stenosis.  No other significant stenosis seen.   Patient name: Krystal Lang Patient MRN: [id number] Date of visit: 04/18/23

## 2023-04-24 ENCOUNTER — Other Ambulatory Visit: Payer: Self-pay | Admitting: Nephrology

## 2023-04-24 DIAGNOSIS — N1831 Chronic kidney disease, stage 3a: Secondary | ICD-10-CM

## 2023-04-28 ENCOUNTER — Encounter: Payer: Self-pay | Admitting: Rehabilitative and Restorative Service Providers"

## 2023-04-28 ENCOUNTER — Ambulatory Visit (INDEPENDENT_AMBULATORY_CARE_PROVIDER_SITE_OTHER): Payer: Medicare Other | Admitting: Rehabilitative and Restorative Service Providers"

## 2023-04-28 ENCOUNTER — Telehealth: Payer: Self-pay

## 2023-04-28 DIAGNOSIS — M79605 Pain in left leg: Secondary | ICD-10-CM

## 2023-04-28 DIAGNOSIS — R262 Difficulty in walking, not elsewhere classified: Secondary | ICD-10-CM | POA: Diagnosis not present

## 2023-04-28 DIAGNOSIS — M6281 Muscle weakness (generalized): Secondary | ICD-10-CM | POA: Diagnosis not present

## 2023-04-28 DIAGNOSIS — M5459 Other low back pain: Secondary | ICD-10-CM | POA: Diagnosis not present

## 2023-04-28 NOTE — Telephone Encounter (Signed)
She has an appt with [place] tomorrow with [place] for Lumbar MRI, do we need to see her for this or did you see it and review with her at her appt last week?

## 2023-04-28 NOTE — Therapy (Signed)
OUTPATIENT PHYSICAL THERAPY EVALUATION   Patient Name: Krystal Lang MRN: [id number] DOB:24-Apr-1955, 68 y.o., female Today's Date: 04/28/2023  END OF SESSION:  PT End of Session - 04/28/23 1024     Visit Number 1    Number of Visits 20    Date for PT Re-Evaluation 07/07/23    Authorization Type Medicare/ AETNA    Progress Note Due on Visit 10    PT Start Time 1016    PT Stop Time 1046    PT Time Calculation (min) 30 min    Activity Tolerance Patient tolerated treatment well    Behavior During Therapy WFL for tasks assessed/performed             Past Medical History:  Diagnosis Date   Anxiety    Arthritis    Cataract    Mixed form OU   Complication of anesthesia    after knee arthroscopy pateint difficult to wake up   Diabetes mellitus    Fibroid uterus 2001   H/O fatigue 1998   Headache(784.0)    denies   Hepatitis    A, 30 years ago   Hirsutism 1998   Idiopathic   History of chicken pox    Hypothyroidism    Menses, irregular 1998   Past Surgical History:  Procedure Laterality Date   COLONOSCOPY  02/2021   DILATION AND CURETTAGE OF UTERUS  03/31/2000   In [place]   KNEE ARTHROPLASTY Right    Replaced in [place], [place]   KNEE ARTHROSCOPY Right 2008   TONSILLECTOMY     As a child   TOTAL KNEE ARTHROPLASTY Left 08/21/2022   Procedure: LEFT TOTAL KNEE ARTHROPLASTY;  Surgeon: Kathryne Hitch, MD;  Location: [facility];  Service: Orthopedics;  Laterality: Left;   Patient Active Problem List   Diagnosis Date Noted   Status post total left knee replacement 08/21/2022   Dyspareunia, female 11/13/2011   Type 2 diabetes mellitus (HCC) 11/13/2011   Alopecia 11/13/2011   History of chicken pox    Headache    Hypothyroidism    Menses, irregular    Hirsutism    H/O fatigue    Fibroid uterus     PCP: Orpah Cobb MD  REFERRING PROVIDER: London Sheer, MD  REFERRING DIAG: M54.50 (ICD-10-CM) - Low back pain, unspecified back pain laterality, unspecified  chronicity, unspecified whether sciatica present  Rationale for Evaluation and Treatment: Rehabilitation  THERAPY DIAG:  Other low back pain  Pain in left leg  Muscle weakness (generalized)  Difficulty in walking, not elsewhere classified  ONSET DATE: 07/2022  SUBJECTIVE:  SUBJECTIVE STATEMENT: Pt indicated not having back complaints prior to surgery on Lt knee.  Pt indicated having pain in Lt side of lumbar and into hip.  Pt indicated off and on trouble.  Pt indicated she can't put weight on Lt leg and feels limited in activity including stairs and ambulation.   Pt indicated having some complaints on outside of knee.   Indicated constant symptoms have been noted.  Reported she tried to lift weights but hurt complaints.  Pt indicated limited in return to gym activity.   PERTINENT HISTORY:  PMH significant for anxiety, OA, DM, hypothyroidism, and Lt TKA 08/21/22.  Rt TKA  PAIN:  NPRS scale: at worst 7-8/10 for back , at worst Lt knee/leg 6-7/10 Pain location: back/hip, lt leg  Pain description: constant, achy Aggravating factors: standing, walking, stairs, sitting prolonged at times for back.  WB pressure Relieving factors: nothing specific   PRECAUTIONS: None  WEIGHT BEARING RESTRICTIONS: No  FALLS:  Has patient fallen in last 6 months? 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   PLOF: Independent, gym based activity  PATIENT GOALS: Get pain better, walk for exercise, go to gym  OBJECTIVE:   PATIENT SURVEYS:  04/28/2023 FOTO eval: 56   predicted:  65  SCREENING FOR RED FLAGS: 04/28/2023 Bowel or bladder incontinence: No Cauda equina syndrome:  No  COGNITION: 04/28/2023 Overall cognitive status: WFL normal      SENSATION: 04/28/2023 Not tested  MUSCLE LENGTH: 04/28/2023 Not performed today.   POSTURE:  04/28/2023 Reduced lumbar lordosis in standing.   PALPATION: 04/28/2023 Tenderness noted in distal Lt quad, proximal incision. Glute max/med and side of thigh also tender on Lt side  LUMBAR ROM:  04/28/2023 Directional Preference Assessment: Centralization: Peripheralization:   AROM 04/28/2023  Flexion To ankles no complaints   Extension 75% WFL c Rt lumbar pain noted (different than chief complaint)   Right lateral flexion   Left lateral flexion   Right rotation To knee joint  Left rotation To lateral femoral epicondyle    (Blank rows = not tested)  LOWER EXTREMITY ROM:      Right 04/28/2023 Left 04/28/2023  Hip flexion    Hip extension    Hip abduction    Hip adduction    Hip internal rotation    Hip external rotation    Knee flexion  102 AROM in heel slide  Knee extension  0 AROM seated heel slide  Ankle dorsiflexion    Ankle plantarflexion    Ankle inversion    Ankle eversion     (Blank rows = not tested)  LOWER EXTREMITY MMT:    MMT Right 04/28/2023 Left 04/28/2023  Hip flexion 5/5 5/5  Hip extension    Hip abduction    Hip adduction    Hip internal rotation    Hip external rotation    Knee flexion 5/5 5/5  Knee extension 5/5 38.9, 41 lbs 4+/5 28, 24.3 lbs  Ankle dorsiflexion 5/5 5/5  Ankle plantarflexion    Ankle inversion    Ankle eversion     (Blank rows = not tested)   SPECIAL TESTS:  04/28/2023 No specific testing  FUNCTIONAL TESTS:  04/28/2023 18 inch chair transfer s UE assist but pain in knees noted.  Lt SLS 8 seconds Rt SLS 10 seconds  GAIT: 04/28/2023 Reduced TKE in stance on Lt leg with trendelenburg gait on Lt.  TODAY'S TREATMENT:                                                                                                         DATE: 04/28/2023  Therex:    HEP instruction/performance c cues for techniques, handout provided.  Trial set performed of each for comprehension and symptom assessment.  See below for exercise list  PATIENT EDUCATION:  04/28/2023 Education details: HEP, POC Person educated: Patient Education method: Explanation, Demonstration, Verbal cues, and Handouts Education comprehension: verbalized understanding, returned demonstration, and verbal cues required  HOME EXERCISE PROGRAM: Access Code: [email or web address] URL: [email or web address] Date: 04/28/2023 Prepared by: Chyrel Masson  Exercises - Supine Lower Trunk Rotation  - 1-2 x daily - 7 x weekly - 1 sets - 3-5 reps - 15 hold - Supine Bridge  - 1-2 x daily - 7 x weekly - 1-2 sets - 10 reps - 2 hold - Supine Single Knee to Chest Stretch  - 1-2 x daily - 7 x weekly - 1 sets - 5 reps - 15 hold - Seated Quad Set  - 1-2 x daily - 7 x weekly - 1 sets - 10 reps - 5 hold - Seated Straight Leg Heel Taps  - 1-2 x daily - 7 x weekly - 3 sets - 10 reps - Sit to Stand  - 3 x daily - 7 x weekly - 1 sets - 10 reps - Standing Hip Hiking  - 1-2 x daily - 7 x weekly - 1 sets - 10 reps - 5 hold  ASSESSMENT:  CLINICAL IMPRESSION: Patient is a 68 y.o. who comes to clinic with complaints of low back pain, Lt leg/knee pain with mobility, strength and movement coordination deficits that impair their ability to perform usual daily and recreational functional activities without increase difficulty/symptoms at this time.  Patient to benefit from skilled PT services to address impairments and limitations to improve to previous level of function without restriction secondary to condition.   OBJECTIVE IMPAIRMENTS: Abnormal gait, decreased activity tolerance, decreased balance, decreased  coordination, decreased endurance, decreased mobility, difficulty walking, decreased ROM, decreased strength, hypomobility, increased fascial restrictions, impaired perceived functional ability, impaired flexibility, improper body mechanics, postural dysfunction, and pain.   ACTIVITY LIMITATIONS: carrying, lifting, bending, sitting, standing, squatting, sleeping, stairs, transfers, bed mobility, locomotion level, and caring for others  PARTICIPATION LIMITATIONS: meal prep, cleaning, laundry, interpersonal relationship, shopping, community activity, and occupation  PERSONAL FACTORS: Time since onset of injury/illness/exacerbation are also affecting patient's functional outcome.   REHAB POTENTIAL: Good  CLINICAL DECISION MAKING: Stable/uncomplicated  EVALUATION COMPLEXITY: Low   GOALS: Goals reviewed with patient? Yes  SHORT TERM GOALS: (target date for Short term goals are 3 weeks 05/19/2023)  1. Patient will demonstrate independent use of home exercise program to maintain progress from in clinic treatments.  Goal status: New  LONG TERM GOALS: (target dates for all long term goals are 10 weeks  07/07/2023 )   1. Patient will demonstrate/report pain at worst less than or equal to 2/10 to facilitate minimal limitation in daily activity secondary to  pain symptoms.  Goal status: New   2. Patient will demonstrate independent use of home exercise program to facilitate ability to maintain/progress functional gains from skilled physical therapy services.  Goal status: New   3. Patient will demonstrate FOTO outcome > or = 65 % to indicate reduced disability due to condition.  Goal status: New   4. Patient will demonstrate lumbar extension 100 % WFL s symptoms to facilitate upright standing, walking posture at PLOF s limitation.  Goal status: New   5.  Patient will demonstrate bilateral knee extension MMT 5/5, dynamometry testing > 45 lbs to facilitate stairs, ambulation and other daily  activity at PLOF.   Goal status: New   6.  Patient will demonstrate bilateral SLS > 15 seconds to improve stability in ambulation.  Goal status: New   7.  Patient will demonstrate ascending/descending stairs reciprocally s UE assist for community integration.   Goal Status: New  PLAN:  PT FREQUENCY: 1-2x/week  PT DURATION: 10 weeks  PLANNED INTERVENTIONS: Can include 16109- PT Re-evaluation, 97110-Therapeutic exercises, 97530- Therapeutic activity, [id number]- Neuromuscular re-education, 97535- Self Care, 97140- Manual therapy, [phone]- Gait training, [phone]- Orthotic Fit/training, [phone]- Canalith repositioning, [id number]- Aquatic Therapy, 97014- Electrical stimulation (unattended), 97750 Physical performance testing, [id number]- Electrical stimulation (manual), 97016- Vasopneumatic device, [id number]- Ultrasound, [id number]- Traction (mechanical), [id number]- Ionotophoresis 4mg /ml Dexamethasone, Patient/Family education, Balance training, Stair training, Taping, Dry Needling, Joint mobilization, Joint manipulation, Spinal manipulation, Spinal mobilization, Scar mobilization, Vestibular training, Visual/preceptual remediation/compensation, DME instructions, Cryotherapy, and Moist heat.  All performed as medically necessary.  All included unless contraindicated  PLAN FOR NEXT SESSION: Review HEP knowledge/results.  Progressive strengthening.   Chyrel Masson, PT, DPT, OCS, ATC 04/28/23  11:00 AM

## 2023-04-29 ENCOUNTER — Encounter: Payer: Self-pay | Admitting: Orthopaedic Surgery

## 2023-04-29 ENCOUNTER — Ambulatory Visit (INDEPENDENT_AMBULATORY_CARE_PROVIDER_SITE_OTHER): Payer: Medicare Other | Admitting: Orthopaedic Surgery

## 2023-04-29 DIAGNOSIS — Z96652 Presence of left artificial knee joint: Secondary | ICD-10-CM

## 2023-04-29 NOTE — Progress Notes (Signed)
The patient is now 8 months status post a left total knee arthroplasty.  She had a right knee placed sometime a year before that.  Her left knee is still having some swelling and some lateral pain.  She is also followed by my partner Dr. Christell Constant who has been physical therapy for her lumbar spine.  She did request a handicap placard which I agree with based on all that she is dealing with.  She is an active 68 year old female.  Her husband who is a cardiologist in the [facility] system is here with her today as well.  On exam she does have lateral knee tenderness but is stable on exam.  Both knees have full extension and both flex to about 100 degrees and of the same on both sides.  I can push her low bit further but was painful.  There is a little more swelling on the left knee than the right knee but that some more recent surgical knee and again has only been 8 months.  It feels stable ligamentously.  I did try to give her reassurance that she is still doing well with her knees.  She cannot take anti-inflammatories at this point due to her chronic kidney disease.  She can try over-the-counter topical patches and even Voltaren gel and have encouraged her to try this.  Even knee sleeve could be helpful.  Will see her at her regular visit in March and will have a standing AP and lateral of both her knees to evaluate the knee replacements.

## 2023-04-29 NOTE — Telephone Encounter (Signed)
I called and lmom advising her of Dr. Kathi Der Message.  Advised her to call back if she wishes to cancel.

## 2023-04-30 ENCOUNTER — Ambulatory Visit
Admission: RE | Admit: 2023-04-30 | Discharge: 2023-04-30 | Disposition: A | Discharge status: Home / Self Care | Payer: Medicare Other | Source: Ambulatory Visit | Attending: Nephrology | Admitting: Nephrology

## 2023-04-30 DIAGNOSIS — N1831 Chronic kidney disease, stage 3a: Secondary | ICD-10-CM

## 2023-05-19 ENCOUNTER — Ambulatory Visit (INDEPENDENT_AMBULATORY_CARE_PROVIDER_SITE_OTHER): Payer: Medicare Other | Admitting: Rehabilitative and Restorative Service Providers"

## 2023-05-19 ENCOUNTER — Encounter: Payer: Self-pay | Admitting: Rehabilitative and Restorative Service Providers"

## 2023-05-19 DIAGNOSIS — R262 Difficulty in walking, not elsewhere classified: Secondary | ICD-10-CM | POA: Diagnosis not present

## 2023-05-19 DIAGNOSIS — M79605 Pain in left leg: Secondary | ICD-10-CM | POA: Diagnosis not present

## 2023-05-19 DIAGNOSIS — M6281 Muscle weakness (generalized): Secondary | ICD-10-CM

## 2023-05-19 DIAGNOSIS — M5459 Other low back pain: Secondary | ICD-10-CM

## 2023-05-19 NOTE — Therapy (Signed)
OUTPATIENT PHYSICAL THERAPY TREATMENT   Patient Name: Krystal Lang MRN: [id number] DOB:[birth year], 68 y.o., female Today's Date: 05/19/2023  END OF SESSION:  PT End of Session - 05/19/23 1352     Visit Number 2    Number of Visits 20    Date for PT Re-Evaluation 07/07/23    Authorization Type Medicare/ AETNA    Progress Note Due on Visit 10    PT Start Time 1342    PT Stop Time 1422    PT Time Calculation (min) 40 min    Activity Tolerance Patient tolerated treatment well    Behavior During Therapy WFL for tasks assessed/performed              Past Medical History:  Diagnosis Date   Anxiety    Arthritis    Cataract    Mixed form OU   Complication of anesthesia    after knee arthroscopy pateint difficult to wake up   Diabetes mellitus    Fibroid uterus 2001   H/O fatigue 1998   Headache(784.0)    denies   Hepatitis    A, 30 years ago   Hirsutism 1998   Idiopathic   History of chicken pox    Hypothyroidism    Menses, irregular 1998   Past Surgical History:  Procedure Laterality Date   COLONOSCOPY  02/2021   DILATION AND CURETTAGE OF UTERUS  03/31/2000   In [place]   KNEE ARTHROPLASTY Right    Replaced in [place], [place]   KNEE ARTHROSCOPY Right 2008   TONSILLECTOMY     As a child   TOTAL KNEE ARTHROPLASTY Left 08/21/2022   Procedure: LEFT TOTAL KNEE ARTHROPLASTY;  Surgeon: Kathryne Hitch, MD;  Location: [facility];  Service: Orthopedics;  Laterality: Left;   Patient Active Problem List   Diagnosis Date Noted   Status post total left knee replacement 08/21/2022   Dyspareunia, female 11/13/2011   Type 2 diabetes mellitus (HCC) 11/13/2011   Alopecia 11/13/2011   History of chicken pox    Headache    Hypothyroidism    Menses, irregular    Hirsutism    H/O fatigue    Fibroid uterus     PCP: Orpah Cobb MD  REFERRING PROVIDER: London Sheer, MD  REFERRING DIAG: M54.50 (ICD-10-CM) - Low back pain, unspecified back pain laterality,  unspecified chronicity, unspecified whether sciatica present  Rationale for Evaluation and Treatment: Rehabilitation  THERAPY DIAG:  Other low back pain  Pain in left leg  Muscle weakness (generalized)  Difficulty in walking, not elsewhere classified  ONSET DATE: 07/2022  SUBJECTIVE:  SUBJECTIVE STATEMENT: Pt indicated tightness in Lt knee and it doesn't bend as much.  Pt indicated the back "is ok"  but hasn't been lifting any weight.  Pt indicated difficulty with stairs.  Reported some trouble in long plane ride but not bad.  Pt indicated back generally better.   PERTINENT HISTORY:  PMH significant for anxiety, OA, DM, hypothyroidism, and Lt TKA 08/21/22.  Rt TKA  PAIN:  NPRS scale: no back pain today.    Lt knee : 6/10 Pain location: back/hip, lt leg  Pain description: constant, achy Aggravating factors: standing, walking, stairs, sitting prolonged at times for back.  WB pressure Relieving factors: nothing specific   PRECAUTIONS: None  WEIGHT BEARING RESTRICTIONS: No  FALLS:  Has patient fallen in last 6 months? 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   PLOF: Independent, gym based activity  PATIENT GOALS: Get pain better, walk for exercise, go to gym  OBJECTIVE:   PATIENT SURVEYS:  04/28/2023 FOTO eval: 56   predicted:  65  SCREENING FOR RED FLAGS: 04/28/2023 Bowel or bladder incontinence: No Cauda equina syndrome: No  COGNITION: 04/28/2023 Overall cognitive status: WFL normal      SENSATION: 04/28/2023 Not tested  MUSCLE LENGTH: 04/28/2023 Not performed today.   POSTURE:  04/28/2023 Reduced lumbar lordosis in standing.    PALPATION: 04/28/2023 Tenderness noted in distal Lt quad, proximal incision. Glute max/med and side of thigh also tender on Lt side  LUMBAR ROM:  04/28/2023 Directional Preference Assessment: Centralization: Peripheralization:   AROM 04/28/2023 05/19/2023  Flexion To ankles no complaints    Extension 75% WFL c Rt lumbar pain noted (different than chief complaint)  100 % WFL  Right lateral flexion    Left lateral flexion    Right rotation To knee joint   Left rotation To lateral femoral epicondyle     (Blank rows = not tested)  LOWER EXTREMITY ROM:      Right 04/28/2023 Left 04/28/2023 Right 05/19/2023 Left 05/19/2023  Hip flexion      Hip extension      Hip abduction      Hip adduction      Hip internal rotation      Hip external rotation      Knee flexion  102 AROM in heel slide 100 AROM heel slide 105 AROM heel slide  Knee extension  0 AROM seated heel slide    Ankle dorsiflexion      Ankle plantarflexion      Ankle inversion      Ankle eversion       (Blank rows = not tested)  LOWER EXTREMITY MMT:    MMT Right 04/28/2023 Left 04/28/2023  Hip flexion 5/5 5/5  Hip extension    Hip abduction    Hip adduction    Hip internal rotation    Hip external rotation    Knee flexion 5/5 5/5  Knee extension 5/5 38.9, 41 lbs 4+/5 28, 24.3 lbs  Ankle dorsiflexion 5/5 5/5  Ankle plantarflexion    Ankle inversion    Ankle eversion     (Blank rows = not tested)   SPECIAL TESTS:  04/28/2023 No specific testing  FUNCTIONAL TESTS:  04/28/2023 18 inch chair transfer s UE assist but pain in knees noted.  Lt SLS 8 seconds Rt SLS 10 seconds  GAIT: 04/28/2023 Reduced TKE in stance on Lt leg with trendelenburg gait  on Lt.                                                                                                                                                                                                                   TODAY'S TREATMENT:                                                                                                          DATE: 05/19/2023 Therex: UBE UE/LE seat 9 for ROM/mobility 8 mins lvl 2.5 Seated quad set c SLR 2 x 10 bilaterally   Verbal review of existing HEP.   Neuro Re-ed (to improve muscle activation, balance control Tandem stance 1 min x 2 bilateral on foam in // bars with occasional HHA Seated quad set activation 5 sec hold  x 10 , performed bilaterally   TherActivity (to improve transfers, ambulation, stairs and other daily mobility ) Leg press double leg 100 lbs 2 x 15, single leg x 15 50 lbs - performed bilaterally  Lateral step up/down 4 inch step x 15 - performed bilaterally  Sit to stand 18 inch no UE assist slow lowering focus x 5      TODAY'S TREATMENT:                                                                                                         DATE: 04/28/2023  Therex:    HEP instruction/performance c cues for techniques, handout provided.  Trial set performed of each for comprehension and symptom assessment.  See below for exercise list  PATIENT EDUCATION:  04/28/2023 Education details: HEP, POC Person educated: Patient Education method:  Explanation, Demonstration, Verbal cues, and Handouts Education comprehension: verbalized understanding, returned demonstration, and verbal cues required  HOME EXERCISE PROGRAM: Access Code: [email or web address] URL: [email or web address] Date: 04/28/2023 Prepared by: Chyrel Masson  Exercises - Supine Lower Trunk Rotation  - 1-2 x daily - 7 x weekly - 1 sets - 3-5 reps - 15 hold - Supine Bridge  - 1-2 x daily - 7 x weekly - 1-2 sets - 10 reps - 2 hold - Supine Single Knee to Chest Stretch  - 1-2 x daily - 7 x weekly - 1 sets - 5 reps - 15 hold - Seated Quad Set  - 1-2 x daily - 7 x weekly - 1 sets - 10 reps - 5 hold - Seated Straight Leg Heel Taps  - 1-2 x daily - 7 x weekly - 3 sets - 10 reps - Sit to Stand  - 3 x daily - 7 x weekly - 1 sets - 10  reps - Standing Hip Hiking  - 1-2 x daily - 7 x weekly - 1 sets - 10 reps - 5 hold  ASSESSMENT:  CLINICAL IMPRESSION: Lumbar mobility reassessment showed improvements compared to eval with reduced severity of symptoms noted on average.  Pt still had complaints of bilateral knee pain/weakness more on Lt than Rt.  Continued education and intervention to help improve strength and confidence in functional activity to help reach goals.   OBJECTIVE IMPAIRMENTS: Abnormal gait, decreased activity tolerance, decreased balance, decreased coordination, decreased endurance, decreased mobility, difficulty walking, decreased ROM, decreased strength, hypomobility, increased fascial restrictions, impaired perceived functional ability, impaired flexibility, improper body mechanics, postural dysfunction, and pain.   ACTIVITY LIMITATIONS: carrying, lifting, bending, sitting, standing, squatting, sleeping, stairs, transfers, bed mobility, locomotion level, and caring for others  PARTICIPATION LIMITATIONS: meal prep, cleaning, laundry, interpersonal relationship, shopping, community activity, and occupation  PERSONAL FACTORS: Time since onset of injury/illness/exacerbation are also affecting patient's functional outcome.   REHAB POTENTIAL: Good  CLINICAL DECISION MAKING: Stable/uncomplicated  EVALUATION COMPLEXITY: Low   GOALS: Goals reviewed with patient? Yes  SHORT TERM GOALS: (target date for Short term goals are 3 weeks 05/19/2023)  1. Patient will demonstrate independent use of home exercise program to maintain progress from in clinic treatments.  Goal status: met 05/19/2023  LONG TERM GOALS: (target dates for all long term goals are 10 weeks  07/07/2023 )   1. Patient will demonstrate/report pain at worst less than or equal to 2/10 to facilitate minimal limitation in daily activity secondary to pain symptoms.  Goal status: New   2. Patient will demonstrate independent use of home exercise program  to facilitate ability to maintain/progress functional gains from skilled physical therapy services.  Goal status: New   3. Patient will demonstrate FOTO outcome > or = 65 % to indicate reduced disability due to condition.  Goal status: New   4. Patient will demonstrate lumbar extension 100 % WFL s symptoms to facilitate upright standing, walking posture at PLOF s limitation.  Goal status: New   5.  Patient will demonstrate bilateral knee extension MMT 5/5, dynamometry testing > 45 lbs to facilitate stairs, ambulation and other daily activity at PLOF.   Goal status: New   6.  Patient will demonstrate bilateral SLS > 15 seconds to improve stability in ambulation.  Goal status: New   7.  Patient will demonstrate ascending/descending stairs reciprocally s UE assist for community integration.   Goal Status: New  PLAN:  PT FREQUENCY: 1-2x/week  PT DURATION: 10 weeks  PLANNED INTERVENTIONS: Can include 08657- PT Re-evaluation, 97110-Therapeutic exercises, 97530- Therapeutic activity, [id number]- Neuromuscular re-education, [phone]- Self Care, 97140- Manual therapy, [phone]- Gait training, [phone]- Orthotic Fit/training, [phone]- Canalith repositioning, [id number]- Aquatic Therapy, [phone]- Electrical stimulation (unattended), 97750 Physical performance testing, [phone]- Electrical stimulation (manual), 97016- Vasopneumatic device, [id number]- Ultrasound, [id number]- Traction (mechanical), [id number]- Ionotophoresis 4mg /ml Dexamethasone, Patient/Family education, Balance training, Stair training, Taping, Dry Needling, Joint mobilization, Joint manipulation, Spinal manipulation, Spinal mobilization, Scar mobilization, Vestibular training, Visual/preceptual remediation/compensation, DME instructions, Cryotherapy, and Moist heat.  All performed as medically necessary.  All included unless contraindicated  PLAN FOR NEXT SESSION: WB strengthening/balance improvements for LE.  Keep track of back symptoms.   Chyrel Masson, PT, DPT,  OCS, ATC 05/19/23  2:21 PM

## 2023-05-21 ENCOUNTER — Encounter: Payer: Self-pay | Admitting: Rehabilitative and Restorative Service Providers"

## 2023-05-21 ENCOUNTER — Ambulatory Visit (INDEPENDENT_AMBULATORY_CARE_PROVIDER_SITE_OTHER): Payer: Medicare Other | Admitting: Rehabilitative and Restorative Service Providers"

## 2023-05-21 DIAGNOSIS — M5459 Other low back pain: Secondary | ICD-10-CM

## 2023-05-21 DIAGNOSIS — M6281 Muscle weakness (generalized): Secondary | ICD-10-CM | POA: Diagnosis not present

## 2023-05-21 DIAGNOSIS — R262 Difficulty in walking, not elsewhere classified: Secondary | ICD-10-CM

## 2023-05-21 DIAGNOSIS — M79605 Pain in left leg: Secondary | ICD-10-CM | POA: Diagnosis not present

## 2023-05-21 NOTE — Therapy (Signed)
OUTPATIENT PHYSICAL THERAPY TREATMENT   Patient Name: Krystal Lang MRN: [id number] DOB:1956-03-08, 68 y.o., female Today's Date: 05/21/2023  END OF SESSION:  PT End of Session - 05/21/23 1357     Visit Number 3    Number of Visits 20    Date for PT Re-Evaluation 07/07/23    Authorization Type Medicare/ AET[employer]    Progress Note Due on Visit 10    PT Start Time 1345    PT Stop Time 1440    PT Time Calculation (min) 55 min    Activity Tolerance Patient tolerated treatment well    Behavior During Therapy WFL for tasks assessed/performed               Past Medical History:  Diagnosis Date   Anxiety    Arthritis    Cataract    Mixed form OU   Complication of anesthesia    after knee arthroscopy pateint difficult to wake up   Diabetes mellitus    Fibroid uterus 2001   H/O fatigue 1998   Headache(784.0)    denies   Hepatitis    A, 30 years ago   Hirsutism 1998   Idiopathic   History of chicken pox    Hypothyroidism    Menses, irregular 1998   Past Surgical History:  Procedure Laterality Date   COLONOSCOPY  02/2021   DILATION AND CURETTAGE OF UTERUS  03/31/2000   In [place]   KNEE ARTHROPLASTY Right    Replaced in [place], [place]   KNEE ARTHROSCOPY Right 2008   TONSILLECTOMY     As a child   TOTAL KNEE ARTHROPLASTY Left 08/21/2022   Procedure: LEFT TOTAL KNEE ARTHROPLASTY;  Surgeon: Kathryne Hitch, MD;  Location: [facility];  Service: Orthopedics;  Laterality: Left;   Patient Active Problem List   Diagnosis Date Noted   Status post total left knee replacement 08/21/2022   Dyspareunia, female 11/13/2011   Type 2 diabetes mellitus (HCC) 11/13/2011   Alopecia 11/13/2011   History of chicken pox    Headache    Hypothyroidism    Menses, irregular    Hirsutism    H/O fatigue    Fibroid uterus     PCP: Orpah Cobb MD  REFERRING PROVIDER: London Sheer, MD  REFERRING DIAG: M54.50 (ICD-10-CM) - Low back pain, unspecified back pain laterality,  unspecified chronicity, unspecified whether sciatica present  Rationale for Evaluation and Treatment: Rehabilitation  THERAPY DIAG:  Other low back pain  Pain in left leg  Muscle weakness (generalized)  Difficulty in walking, not elsewhere classified  ONSET DATE: 07/2022  SUBJECTIVE:  SUBJECTIVE STATEMENT: Pt indicated again feeling like the pain in knee isn't as much as the other one was.  Pt. Reported back of knee tightness when bending.   Pt indicated climbing stairs "all day yesterday" and felt sore this morning.    PERTINENT HISTORY:  PMH significant for anxiety, OA, DM, hypothyroidism, and Lt TKA 08/21/22.  Rt TKA  PAIN:  NPRS scale:  no specific number given upon arrival.  Pain location: back/hip, lt leg  Pain description: constant, achy Aggravating factors: standing, walking, stairs, sitting prolonged at times for back.  WB pressure Relieving factors: nothing specific   PRECAUTIONS: None  WEIGHT BEARING RESTRICTIONS: No  FALLS:  Has patient fallen in last 6 months? 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   PLOF: Independent, gym based activity  PATIENT GOALS: Get pain better, walk for exercise, go to gym  OBJECTIVE:   PATIENT SURVEYS:  04/28/2023 FOTO eval: 56   predicted:  65  SCREENING FOR RED FLAGS: 04/28/2023 Bowel or bladder incontinence: No Cauda equina syndrome: No  COGNITION: 04/28/2023 Overall cognitive status: WFL normal      SENSATION: 04/28/2023 Not tested  MUSCLE LENGTH: 04/28/2023 Not performed today.   POSTURE:  04/28/2023 Reduced lumbar lordosis in standing.   PALPATION: 04/28/2023 Tenderness noted in distal Lt quad,  proximal incision. Glute max/med and side of thigh also tender on Lt side  LUMBAR ROM:  04/28/2023 Directional Preference Assessment: Centralization: Peripheralization:   AROM 04/28/2023 05/19/2023  Flexion To ankles no complaints    Extension 75% WFL c Rt lumbar pain noted (different than chief complaint)  100 % WFL  Right lateral flexion    Left lateral flexion    Right rotation To knee joint   Left rotation To lateral femoral epicondyle     (Blank rows = not tested)  LOWER EXTREMITY ROM:      Right 04/28/2023 Left 04/28/2023 Right 05/19/2023 Left 05/19/2023  Hip flexion      Hip extension      Hip abduction      Hip adduction      Hip internal rotation      Hip external rotation      Knee flexion  102 AROM in heel slide 100 AROM heel slide 105 AROM heel slide  Knee extension  0 AROM seated heel slide    Ankle dorsiflexion      Ankle plantarflexion      Ankle inversion      Ankle eversion       (Blank rows = not tested)  LOWER EXTREMITY MMT:    MMT Right 04/28/2023 Left 04/28/2023  Hip flexion 5/5 5/5  Hip extension    Hip abduction    Hip adduction    Hip internal rotation    Hip external rotation    Knee flexion 5/5 5/5  Knee extension 5/5 38.9, 41 lbs 4+/5 28, 24.3 lbs  Ankle dorsiflexion 5/5 5/5  Ankle plantarflexion    Ankle inversion    Ankle eversion     (Blank rows = not tested)   SPECIAL TESTS:  04/28/2023 No specific testing  FUNCTIONAL TESTS:  04/28/2023 18 inch chair transfer s UE assist but pain in knees noted.  Lt SLS 8 seconds Rt SLS 10 seconds  GAIT: 04/28/2023 Reduced TKE in stance on Lt leg with trendelenburg gait on Lt.  TODAY'S TREATMENT:                                                                                                          DATE:  05/21/2023 Therex: UBE UE/LE seat 9 for ROM/mobility 10 mins lvl 3 Supine hooklying lumbar trunk rotation stretch 15 sec x 3 bilateral  Supine hooklying bridge c green band hip abduction hold x 20  Seated Lt knee AAROM overpressure from Rt leg 15 sec hold x 3 Seated quad set c SLR 2 x 10 bilaterally  1l b   Continued emphasis on routine HEP , particularly for symptom relief when symptoms are more present.  Continued education on non surgical reasoning for symptoms (muscle tightness, weakness, mobility deficits, etc) vs. Something "being wrong with surgery."   Neuro Re-ed (to improve muscle activation, balance control Tandem stance 1 min x 2 bilateral on foam in // bars with occasional HHA  TherActivity (to improve transfers, ambulation, stairs and other daily mobility ) Leg press double leg 100 lbs 2 x 15, single leg 2 x 10  50 lbs - performed bilaterally  Step on over and down 4 inch step x 15 each LE WB.  Sit to stand to sit 18 inch x 15 with slow lowering focus.    TODAY'S TREATMENT:                                                                                                         DATE: 05/19/2023 Therex: UBE UE/LE seat 9 for ROM/mobility 8 mins lvl 2.5 Seated quad set c SLR 2 x 10 bilaterally   Verbal review of existing HEP.   Neuro Re-ed (to improve muscle activation, balance control Tandem stance 1 min x 2 bilateral on foam in // bars with occasional HHA Seated quad set activation 5 sec hold  x 10 , performed bilaterally   TherActivity (to improve transfers, ambulation, stairs and other daily mobility ) Leg press double leg 100 lbs 2 x 15, single leg x 15 50 lbs - performed bilaterally  Lateral step up/down 4 inch step x 15 - performed bilaterally  Sit to stand 18 inch no UE assist slow lowering focus x 5      TODAY'S TREATMENT:  DATE: 04/28/2023   Therex:    HEP instruction/performance c cues for techniques, handout provided.  Trial set performed of each for comprehension and symptom assessment.  See below for exercise list  PATIENT EDUCATION:  04/28/2023 Education details: HEP, POC Person educated: Patient Education method: Explanation, Demonstration, Verbal cues, and Handouts Education comprehension: verbalized understanding, returned demonstration, and verbal cues required  HOME EXERCISE PROGRAM: Access Code: [email or web address] URL: [email or web address] Date: 04/28/2023 Prepared by: Chyrel Masson  Exercises - Supine Lower Trunk Rotation  - 1-2 x daily - 7 x weekly - 1 sets - 3-5 reps - 15 hold - Supine Bridge  - 1-2 x daily - 7 x weekly - 1-2 sets - 10 reps - 2 hold - Supine Single Knee to Chest Stretch  - 1-2 x daily - 7 x weekly - 1 sets - 5 reps - 15 hold - Seated Quad Set  - 1-2 x daily - 7 x weekly - 1 sets - 10 reps - 5 hold - Seated Straight Leg Heel Taps  - 1-2 x daily - 7 x weekly - 3 sets - 10 reps - Sit to Stand  - 3 x daily - 7 x weekly - 1 sets - 10 reps - Standing Hip Hiking  - 1-2 x daily - 7 x weekly - 1 sets - 10 reps - 5 hold  ASSESSMENT:  CLINICAL IMPRESSION: Pt continued to present with complaints of Lt knee tightness with discomfort as well as weakness that impacted functional mobility.  Continued discussion and education on importance of routine HEP for symptom relief (both back and leg) as well as goals for strengthening and mobility gains to help symptoms and presentation.  Recommend continued skilled PT services at this time.   OBJECTIVE IMPAIRMENTS: Abnormal gait, decreased activity tolerance, decreased balance, decreased coordination, decreased endurance, decreased mobility, difficulty walking, decreased ROM, decreased strength, hypomobility, increased fascial restrictions, impaired perceived functional ability, impaired flexibility, improper body mechanics, postural dysfunction, and pain.    ACTIVITY LIMITATIONS: carrying, lifting, bending, sitting, standing, squatting, sleeping, stairs, transfers, bed mobility, locomotion level, and caring for others  PARTICIPATION LIMITATIONS: meal prep, cleaning, laundry, interpersonal relationship, shopping, community activity, and occupation  PERSONAL FACTORS: Time since onset of injury/illness/exacerbation are also affecting patient's functional outcome.   REHAB POTENTIAL: Good  CLINICAL DECISION MAKING: Stable/uncomplicated  EVALUATION COMPLEXITY: Low   GOALS: Goals reviewed with patient? Yes  SHORT TERM GOALS: (target date for Short term goals are 3 weeks 05/19/2023)  1. Patient will demonstrate independent use of home exercise program to maintain progress from in clinic treatments.  Goal status: met 05/19/2023  LONG TERM GOALS: (target dates for all long term goals are 10 weeks  07/07/2023 )   1. Patient will demonstrate/report pain at worst less than or equal to 2/10 to facilitate minimal limitation in daily activity secondary to pain symptoms.  Goal status: New   2. Patient will demonstrate independent use of home exercise program to facilitate ability to maintain/progress functional gains from skilled physical therapy services.  Goal status: New   3. Patient will demonstrate FOTO outcome > or = 65 % to indicate reduced disability due to condition.  Goal status: New   4. Patient will demonstrate lumbar extension 100 % WFL s symptoms to facilitate upright standing, walking posture at PLOF s limitation.  Goal status: New   5.  Patient will demonstrate bilateral knee extension MMT 5/5, dynamometry testing > 45 lbs to facilitate stairs, ambulation and other daily activity at PLOF.  Goal status: New   6.  Patient will demonstrate bilateral SLS > 15 seconds to improve stability in ambulation.  Goal status: New   7.  Patient will demonstrate ascending/descending stairs reciprocally s UE assist for community integration.    Goal Status: New  PLAN:  PT FREQUENCY: 1-2x/week  PT DURATION: 10 weeks  PLANNED INTERVENTIONS: Can include 40981- PT Re-evaluation, 97110-Therapeutic exercises, 97530- Therapeutic activity, 97112- Neuromuscular re-education, [phone]- Self Care, 97140- Manual therapy, [phone]- Gait training, [phone]- Orthotic Fit/training, [phone]- Canalith repositioning, [id number]- Aquatic Therapy, [phone]- Electrical stimulation (unattended), 97750 Physical performance testing, [id number]- Electrical stimulation (manual), 97016- Vasopneumatic device, [id number]- Ultrasound, [id number]- Traction (mechanical), [id number]- Ionotophoresis 4mg /ml Dexamethasone, Patient/Family education, Balance training, Stair training, Taping, Dry Needling, Joint mobilization, Joint manipulation, Spinal manipulation, Spinal mobilization, Scar mobilization, Vestibular training, Visual/preceptual remediation/compensation, DME instructions, Cryotherapy, and Moist heat.  All performed as medically necessary.  All included unless contraindicated  PLAN FOR NEXT SESSION: Recheck knee mobility.  Possible manual mobilization to assist flexion mobility Lt knee.   Chyrel Masson, PT, DPT, OCS, ATC 05/21/23  2:46 PM

## 2023-05-26 ENCOUNTER — Encounter: Payer: Self-pay | Admitting: Rehabilitative and Restorative Service Providers"

## 2023-05-26 ENCOUNTER — Ambulatory Visit (INDEPENDENT_AMBULATORY_CARE_PROVIDER_SITE_OTHER): Payer: Medicare Other | Admitting: Rehabilitative and Restorative Service Providers"

## 2023-05-26 DIAGNOSIS — M79605 Pain in left leg: Secondary | ICD-10-CM | POA: Diagnosis not present

## 2023-05-26 DIAGNOSIS — M5459 Other low back pain: Secondary | ICD-10-CM

## 2023-05-26 DIAGNOSIS — M6281 Muscle weakness (generalized): Secondary | ICD-10-CM | POA: Diagnosis not present

## 2023-05-26 DIAGNOSIS — R262 Difficulty in walking, not elsewhere classified: Secondary | ICD-10-CM

## 2023-05-26 NOTE — Therapy (Signed)
 OUTPATIENT PHYSICAL THERAPY TREATMENT   Patient Name: Krystal Lang MRN: [id number] DOB:1955-09-19, 68 y.o., female Today's Date: 05/26/2023  END OF SESSION:  PT End of Session - 05/26/23 1351     Visit Number 4    Number of Visits 20    Date for PT Re-Evaluation 07/07/23    Authorization Type Medicare/ [employer]ET[employer]    Progress Note Due on Visit 10    PT Start Time 1345    PT Stop Time 1424    PT Time Calculation (min) 39 min    Activity Tolerance Patient tolerated treatment well    Behavior During Therapy WFL for tasks assessed/performed                Past Medical History:  Diagnosis Date   Anxiety    Arthritis    Cataract    Mixed form OU   Complication of anesthesia    after knee arthroscopy pateint difficult to wake up   Diabetes mellitus    Fibroid uterus 2001   H/O fatigue 1998   Headache(784.0)    denies   Hepatitis    A, 30 years ago   Hirsutism 1998   Idiopathic   History of chicken pox    Hypothyroidism    Menses, irregular 1998   Past Surgical History:  Procedure Laterality Date   COLONOSCOPY  02/2021   DILATION AND CURETTAGE OF UTERUS  03/31/2000   In [place]   KNEE ARTHROPLASTY Right    Replaced in [place], [place]   KNEE ARTHROSCOPY Right 2008   TONSILLECTOMY     As a child   TOTAL KNEE ARTHROPLASTY Left 08/21/2022   Procedure: LEFT TOTAL KNEE ARTHROPLASTY;  Surgeon: Kathryne Hitch, MD;  Location: [facility];  Service: Orthopedics;  Laterality: Left;   Patient Active Problem List   Diagnosis Date Noted   Status post total left knee replacement 08/21/2022   Dyspareunia, female 11/13/2011   Type 2 diabetes mellitus (HCC) 11/13/2011   Alopecia 11/13/2011   History of chicken pox    Headache    Hypothyroidism    Menses, irregular    Hirsutism    H/O fatigue    Fibroid uterus     PCP: Orpah Cobb MD  REFERRING PROVIDER: London Sheer, MD  REFERRING DIAG: M54.50 (ICD-10-CM) - Low back pain, unspecified back pain laterality,  unspecified chronicity, unspecified whether sciatica present  Rationale for Evaluation and Treatment: Rehabilitation  THERAPY DIAG:  Other low back pain  Pain in left leg  Muscle weakness (generalized)  Difficulty in walking, not elsewhere classified  ONSET DATE: 07/2022  SUBJECTIVE:  SUBJECTIVE STATEMENT: Pt indicated again feeling like the pain in knee isn't as much as the other one was.  Pt. Reported back of knee tightness when bending.   Pt indicated climbing stairs "all day yesterday" and felt sore this morning.    PERTINENT HISTORY:  PMH significant for anxiety, OA, DM, hypothyroidism, and Lt TKA 08/21/22.  Rt TKA  PAIN:  NPRS scale:  no specific number given upon arrival.  Pain location: back/hip, lt leg  Pain description: constant, achy Aggravating factors: standing, walking, stairs, sitting prolonged at times for back.  WB pressure Relieving factors: nothing specific   PRECAUTIONS: None  WEIGHT BEARING RESTRICTIONS: No  FALLS:  Has patient fallen in last 6 months? 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   PLOF: Independent, gym based activity  PATIENT GOALS: Get pain better, walk for exercise, go to gym  OBJECTIVE:   PATIENT SURVEYS:  04/28/2023 FOTO eval: 56   predicted:  65  SCREENING FOR RED FLAGS: 04/28/2023 Bowel or bladder incontinence: No Cauda equina syndrome: No  COGNITION: 04/28/2023 Overall cognitive status: WFL normal      SENSATION: 04/28/2023 Not tested  MUSCLE LENGTH: 04/28/2023 Not performed today.   POSTURE:  04/28/2023 Reduced lumbar lordosis in standing.   PALPATION: 04/28/2023 Tenderness noted in distal Lt quad,  proximal incision. Glute max/med and side of thigh also tender on Lt side  LUMBAR ROM:  04/28/2023 Directional Preference Assessment: Centralization: Peripheralization:   AROM 04/28/2023 05/19/2023  Flexion To ankles no complaints    Extension 75% WFL c Rt lumbar pain noted (different than chief complaint)  100 % WFL  Right lateral flexion    Left lateral flexion    Right rotation To knee joint   Left rotation To lateral femoral epicondyle     (Blank rows = not tested)  LOWER EXTREMITY ROM:      Right 04/28/2023 Left 04/28/2023 Right 05/19/2023 Left 05/19/2023  Hip flexion      Hip extension      Hip abduction      Hip adduction      Hip internal rotation      Hip external rotation      Knee flexion  102 AROM in heel slide 100 AROM heel slide 105 AROM heel slide  Knee extension  0 AROM seated heel slide    Ankle dorsiflexion      Ankle plantarflexion      Ankle inversion      Ankle eversion       (Blank rows = not tested)  LOWER EXTREMITY MMT:    MMT Right 04/28/2023 Left 04/28/2023  Hip flexion 5/5 5/5  Hip extension    Hip abduction    Hip adduction    Hip internal rotation    Hip external rotation    Knee flexion 5/5 5/5  Knee extension 5/5 38.9, 41 lbs 4+/5 28, 24.3 lbs  Ankle dorsiflexion 5/5 5/5  Ankle plantarflexion    Ankle inversion    Ankle eversion     (Blank rows = not tested)   SPECIAL TESTS:  04/28/2023 No specific testing  FUNCTIONAL TESTS:  04/28/2023 18 inch chair transfer s UE assist but pain in knees noted.  Lt SLS 8 seconds Rt SLS 10 seconds  GAIT: 04/28/2023 Reduced TKE in stance on Lt leg with trendelenburg gait on Lt.  TODAY'S TREATMENT:                                                                                                          DATE:  05/26/2023 Therex: UBE UE/LE seat 8 for ROM/mobility 10 mins lvl 3 Supine hooklying bridge c blue  band hip abduction hold x 20  Single knee to opposite shoulder supine stretch 15 sec x 3 bilateral  Seated quad set with SLR x 15 bilateral with slow movement focus. 1 lb   Manual Percussive device Lt lateral hip/thigh for soft tissue mobilization/ trigger point release.  Education and use of tennis ball pressure for at home use.   TherActivity (to improve transfers, ambulation, stairs and other daily mobility ) Leg press double leg 100 lbs x 20, single leg 2 x 15  50 lbs - performed bilaterally    TODAY'S TREATMENT:                                                                                                         DATE:  05/21/2023 Therex: UBE UE/LE seat 9 for ROM/mobility 10 mins lvl 3 Supine hooklying lumbar trunk rotation stretch 15 sec x 3 bilateral  Supine hooklying bridge c green band hip abduction hold x 20  Seated Lt knee AAROM overpressure from Rt leg 15 sec hold x 3 Seated quad set c SLR 2 x 10 bilaterally  1l b   Continued emphasis on routine HEP , particularly for symptom relief when symptoms are more present.  Continued education on non surgical reasoning for symptoms (muscle tightness, weakness, mobility deficits, etc) vs. Something "being wrong with surgery."   Neuro Re-ed (to improve muscle activation, balance control Tandem stance 1 min x 2 bilateral on foam in // bars with occasional HHA  TherActivity (to improve transfers, ambulation, stairs and other daily mobility ) Leg press double leg 100 lbs 2 x 15, single leg 2 x 10  50 lbs - performed bilaterally  Step on over and down 4 inch step x 15 each LE WB.  Sit to stand to sit 18 inch x 15 with slow lowering focus.    TODAY'S TREATMENT:  DATE: 05/19/2023 Therex: UBE UE/LE seat 9 for ROM/mobility  8 mins lvl 2.5 Seated quad set c SLR 2 x 10 bilaterally   Verbal review of existing HEP.   Neuro Re-ed (to improve muscle activation, balance control Tandem stance 1 min x 2 bilateral on foam in // bars with occasional HHA Seated quad set activation 5 sec hold  x 10 , performed bilaterally   TherActivity (to improve transfers, ambulation, stairs and other daily mobility ) Leg press double leg 100 lbs 2 x 15, single leg x 15 50 lbs - performed bilaterally  Lateral step up/down 4 inch step x 15 - performed bilaterally  Sit to stand 18 inch no UE assist slow lowering focus x 5      TODAY'S TREATMENT:                                                                                                         DATE: 04/28/2023  Therex:    HEP instruction/performance c cues for techniques, handout provided.  Trial set performed of each for comprehension and symptom assessment.  See below for exercise list  PATIENT EDUCATION:  04/28/2023 Education details: HEP, POC Person educated: Patient Education method: Explanation, Demonstration, Verbal cues, and Handouts Education comprehension: verbalized understanding, returned demonstration, and verbal cues required  HOME EXERCISE PROGRAM: Access Code: [email or web address] URL: [email or web address] Date: 04/28/2023 Prepared by: Chyrel Masson  Exercises - Supine Lower Trunk Rotation  - 1-2 x daily - 7 x weekly - 1 sets - 3-5 reps - 15 hold - Supine Bridge  - 1-2 x daily - 7 x weekly - 1-2 sets - 10 reps - 2 hold - Supine Single Knee to Chest Stretch  - 1-2 x daily - 7 x weekly - 1 sets - 5 reps - 15 hold - Seated Quad Set  - 1-2 x daily - 7 x weekly - 1 sets - 10 reps - 5 hold - Seated Straight Leg Heel Taps  - 1-2 x daily - 7 x weekly - 3 sets - 10 reps - Sit to Stand  - 3 x daily - 7 x weekly - 1 sets - 10 reps - Standing Hip Hiking  - 1-2 x daily - 7 x weekly - 1 sets - 10 reps - 5 hold  ASSESSMENT:  CLINICAL IMPRESSION: Pt expressed  improvement in lateral hip/thigh /knee tightness after manual intervention options.  Plan to follow up and use in future if necessary/desired.  Continued skilled PT services indicated to help continue to improve general mobility and strength for daily activity tolerance improvements.   OBJECTIVE IMPAIRMENTS: Abnormal gait, decreased activity tolerance, decreased balance, decreased coordination, decreased endurance, decreased mobility, difficulty walking, decreased ROM, decreased strength, hypomobility, increased fascial restrictions, impaired perceived functional ability, impaired flexibility, improper body mechanics, postural dysfunction, and pain.   ACTIVITY LIMITATIONS: carrying, lifting, bending, sitting, standing, squatting, sleeping, stairs, transfers, bed mobility, locomotion level, and caring for others  PARTICIPATION LIMITATIONS: meal prep, cleaning, laundry, interpersonal relationship, shopping, community activity, and occupation  PERSONAL FACTORS: Time since onset of injury/illness/exacerbation  are also affecting patient's functional outcome.   REHAB POTENTIAL: Good  CLINICAL DECISION MAKING: Stable/uncomplicated  EVALUATION COMPLEXITY: Low   GOALS: Goals reviewed with patient? Yes  SHORT TERM GOALS: (target date for Short term goals are 3 weeks 05/19/2023)  1. Patient will demonstrate independent use of home exercise program to maintain progress from in clinic treatments.  Goal status: met 05/19/2023  LONG TERM GOALS: (target dates for all long term goals are 10 weeks  07/07/2023 )   1. Patient will demonstrate/report pain at worst less than or equal to 2/10 to facilitate minimal limitation in daily activity secondary to pain symptoms.  Goal status: on going 05/26/2023   2. Patient will demonstrate independent use of home exercise program to facilitate ability to maintain/progress functional gains from skilled physical therapy services.  Goal status: on going 05/26/2023   3.  Patient will demonstrate FOTO outcome > or = 65 % to indicate reduced disability due to condition.  Goal status: on going 05/26/2023   4. Patient will demonstrate lumbar extension 100 % WFL s symptoms to facilitate upright standing, walking posture at PLOF s limitation.  Goal status: on going 05/26/2023   5.  Patient will demonstrate bilateral knee extension MMT 5/5, dynamometry testing > 45 lbs to facilitate stairs, ambulation and other daily activity at PLOF.   Goal status: on going 05/26/2023   6.  Patient will demonstrate bilateral SLS > 15 seconds to improve stability in ambulation.  Goal status: on going 05/26/2023   7.  Patient will demonstrate ascending/descending stairs reciprocally s UE assist for community integration.   Goal Status: on going 05/26/2023  PLAN:  PT FREQUENCY: 1-2x/week  PT DURATION: 10 weeks  PLANNED INTERVENTIONS: Can include 60454- PT Re-evaluation, 97110-Therapeutic exercises, 97530- Therapeutic activity, 97112- Neuromuscular re-education, 97535- Self Care, 97140- Manual therapy, [phone]- Gait training, [phone]- Orthotic Fit/training, [phone]- Canalith repositioning, [id number]- Aquatic Therapy, [phone]- Electrical stimulation (unattended), 97750 Physical performance testing, [id number]- Electrical stimulation (manual), 97016- Vasopneumatic device, [id number]- Ultrasound, [id number]- Traction (mechanical), [id number]- Ionotophoresis 4mg /ml Dexamethasone, Patient/Family education, Balance training, Stair training, Taping, Dry Needling, Joint mobilization, Joint manipulation, Spinal manipulation, Spinal mobilization, Scar mobilization, Vestibular training, Visual/preceptual remediation/compensation, DME instructions, Cryotherapy, and Moist heat.  All performed as medically necessary.  All included unless contraindicated  PLAN FOR NEXT SESSION: Percussive device as desired.   Chyrel Masson, PT, DPT, OCS, ATC 05/26/23  2:21 PM

## 2023-05-28 ENCOUNTER — Ambulatory Visit (INDEPENDENT_AMBULATORY_CARE_PROVIDER_SITE_OTHER): Payer: Medicare Other | Admitting: Rehabilitative and Restorative Service Providers"

## 2023-05-28 ENCOUNTER — Encounter: Payer: Self-pay | Admitting: Rehabilitative and Restorative Service Providers"

## 2023-05-28 DIAGNOSIS — M79605 Pain in left leg: Secondary | ICD-10-CM

## 2023-05-28 DIAGNOSIS — M5459 Other low back pain: Secondary | ICD-10-CM | POA: Diagnosis not present

## 2023-05-28 DIAGNOSIS — R262 Difficulty in walking, not elsewhere classified: Secondary | ICD-10-CM | POA: Diagnosis not present

## 2023-05-28 DIAGNOSIS — M6281 Muscle weakness (generalized): Secondary | ICD-10-CM | POA: Diagnosis not present

## 2023-05-28 NOTE — Therapy (Signed)
 OUTPATIENT PHYSICAL THERAPY TREATMENT   Patient Name: Krystal Lang MRN: [id number] DOB:[birth year], 68 y.o., female Today's Date: 05/28/2023  END OF SESSION:  PT End of Session - 05/28/23 1355     Visit Number 5    Number of Visits 20    Date for PT Re-Evaluation 07/07/23    Authorization Type Medicare/ [employer]ET[employer]    Progress Note Due on Visit 10    PT Start Time 1349    PT Stop Time 1428    PT Time Calculation (min) 39 min    Activity Tolerance Patient tolerated treatment well    Behavior During Therapy WFL for tasks assessed/performed                 Past Medical History:  Diagnosis Date   Anxiety    Arthritis    Cataract    Mixed form OU   Complication of anesthesia    after knee arthroscopy pateint difficult to wake up   Diabetes mellitus    Fibroid uterus 2001   H/O fatigue 1998   Headache(784.0)    denies   Hepatitis    A, 30 years ago   Hirsutism 1998   Idiopathic   History of chicken pox    Hypothyroidism    Menses, irregular 1998   Past Surgical History:  Procedure Laterality Date   COLONOSCOPY  02/2021   DILATION AND CURETTAGE OF UTERUS  03/31/2000   In [place]   KNEE ARTHROPLASTY Right    Replaced in [place], [place]   KNEE ARTHROSCOPY Right 2008   TONSILLECTOMY     As a child   TOTAL KNEE ARTHROPLASTY Left 08/21/2022   Procedure: LEFT TOTAL KNEE ARTHROPLASTY;  Surgeon: Kathryne Hitch, MD;  Location: [facility];  Service: Orthopedics;  Laterality: Left;   Patient Active Problem List   Diagnosis Date Noted   Status post total left knee replacement 08/21/2022   Dyspareunia, female 11/13/2011   Type 2 diabetes mellitus (HCC) 11/13/2011   Alopecia 11/13/2011   History of chicken pox    Headache    Hypothyroidism    Menses, irregular    Hirsutism    H/O fatigue    Fibroid uterus     PCP: Orpah Cobb MD  REFERRING PROVIDER: London Sheer, MD  REFERRING DIAG: M54.50 (ICD-10-CM) - Low back pain, unspecified back pain laterality,  unspecified chronicity, unspecified whether sciatica present  Rationale for Evaluation and Treatment: Rehabilitation  THERAPY DIAG:  Other low back pain  Pain in left leg  Muscle weakness (generalized)  Difficulty in walking, not elsewhere classified  ONSET DATE: 07/2022  SUBJECTIVE:  SUBJECTIVE STATEMENT: Pt indicated feeling some symptoms after bending in exercise.    PERTINENT HISTORY:  PMH significant for anxiety, OA, DM, hypothyroidism, and Lt TKA 08/21/22.  Rt TKA  PAIN:  NPRS scale:  no specific number given upon arrival.  Pain location: back/hip, lt leg  Pain description: constant, achy Aggravating factors: standing, walking, stairs, sitting prolonged at times for back.  WB pressure Relieving factors: nothing specific   PRECAUTIONS: None  WEIGHT BEARING RESTRICTIONS: No  FALLS:  Has patient fallen in last 6 months? 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   PLOF: Independent, gym based activity  PATIENT GOALS: Get pain better, walk for exercise, go to gym  OBJECTIVE:   PATIENT SURVEYS:  05/28/2023: FOTO update:  67  04/28/2023 FOTO eval: 56   predicted:  65  SCREENING FOR RED FLAGS: 04/28/2023 Bowel or bladder incontinence: No Cauda equina syndrome: No  COGNITION: 04/28/2023 Overall cognitive status: WFL normal      SENSATION: 04/28/2023 Not tested  MUSCLE LENGTH: 04/28/2023 Not performed today.   POSTURE:  04/28/2023 Reduced lumbar lordosis in standing.   PALPATION: 04/28/2023 Tenderness noted in distal Lt quad, proximal incision. Glute max/med and side of thigh also tender on Lt side  LUMBAR ROM:  04/28/2023 Directional Preference  Assessment: Centralization: Peripheralization:   AROM 04/28/2023 05/19/2023  Flexion To ankles no complaints    Extension 75% WFL c Rt lumbar pain noted (different than chief complaint)  100 % WFL  Right lateral flexion    Left lateral flexion    Right rotation To knee joint   Left rotation To lateral femoral epicondyle     (Blank rows = not tested)  LOWER EXTREMITY ROM:      Right 04/28/2023 Left 04/28/2023 Right 05/19/2023 Left 05/19/2023 Right 05/28/2023 Left 05/28/2023  Hip flexion        Hip extension        Hip abduction        Hip adduction        Hip internal rotation        Hip external rotation        Knee flexion  102 AROM in heel slide 100 AROM heel slide 105 AROM heel slide 107 AROM heel slide 109 AROM heel slide  Knee extension  0 AROM seated heel slide      Ankle dorsiflexion        Ankle plantarflexion        Ankle inversion        Ankle eversion         (Blank rows = not tested)  LOWER EXTREMITY MMT:    MMT Right 04/28/2023 Left 04/28/2023  Hip flexion 5/5 5/5  Hip extension    Hip abduction    Hip adduction    Hip internal rotation    Hip external rotation    Knee flexion 5/5 5/5  Knee extension 5/5 38.9, 41 lbs 4+/5 28, 24.3 lbs  Ankle dorsiflexion 5/5 5/5  Ankle plantarflexion    Ankle inversion    Ankle eversion     (Blank rows = not tested)   SPECIAL TESTS:  04/28/2023 No specific testing  FUNCTIONAL TESTS:  04/28/2023 18 inch chair transfer s UE assist but pain in knees noted.  Lt SLS 8 seconds Rt SLS 10 seconds  GAIT: 04/28/2023 Reduced TKE in stance on Lt leg with trendelenburg gait on Lt.  TODAY'S TREATMENT:                                                                                                         DATE:   05/28/2023 Therex: UBE UE/LE seat 8 for ROM/mobility 10 mins lvl 3 Supine hooklying bridge  2 x 10  Supine lumbar trunk rotation 15 sec x 3 bilateral   Manual Percussive device Lt lateral hip/thigh, Rt posterior hip for soft tissue mobilization/ trigger point release.   Seated knee flexion c IR/distraction bilateral  TherActivity (to improve transfers, ambulation, stairs and other daily mobility ) Leg press double leg 100 lbs x 20, single leg 2 x 15  50 lbs - performed bilaterally   TODAY'S TREATMENT:                                                                                                         DATE:  05/26/2023 Therex: UBE UE/LE seat 8 for ROM/mobility 10 mins lvl 3 Supine hooklying bridge c blue  band hip abduction hold x 20  Single knee to opposite shoulder supine stretch 15 sec x 3 bilateral  Seated quad set with SLR x 15 bilateral with slow movement focus. 1 lb   Manual Percussive device Lt lateral hip/thigh for soft tissue mobilization/ trigger point release.  Education and use of tennis ball pressure for at home use.   TherActivity (to improve transfers, ambulation, stairs and other daily mobility ) Leg press double leg 100 lbs x 20, single leg 2 x 15  50 lbs - performed bilaterally    TODAY'S TREATMENT:                                                                                                         DATE:  05/21/2023 Therex: UBE UE/LE seat 9 for ROM/mobility 10 mins lvl 3 Supine hooklying lumbar trunk rotation stretch 15 sec x 3 bilateral  Supine hooklying bridge c green band hip abduction hold x 20  Seated Lt knee AAROM overpressure from Rt leg 15 sec hold x 3 Seated quad set c SLR 2 x 10 bilaterally  1l b   Continued emphasis on routine HEP , particularly for symptom relief  when symptoms are more present.  Continued education on non surgical reasoning for symptoms (muscle tightness, weakness, mobility deficits, etc) vs. Something "being wrong with surgery."    Neuro Re-ed (to improve muscle activation, balance control Tandem stance 1 min x 2 bilateral on foam in // bars with occasional HHA  TherActivity (to improve transfers, ambulation, stairs and other daily mobility ) Leg press double leg 100 lbs 2 x 15, single leg 2 x 10  50 lbs - performed bilaterally  Step on over and down 4 inch step x 15 each LE WB.  Sit to stand to sit 18 inch x 15 with slow lowering focus.    TODAY'S TREATMENT:                                                                                                         DATE: 05/19/2023 Therex: UBE UE/LE seat 9 for ROM/mobility 8 mins lvl 2.5 Seated quad set c SLR 2 x 10 bilaterally   Verbal review of existing HEP.   Neuro Re-ed (to improve muscle activation, balance control Tandem stance 1 min x 2 bilateral on foam in // bars with occasional HHA Seated quad set activation 5 sec hold  x 10 , performed bilaterally   TherActivity (to improve transfers, ambulation, stairs and other daily mobility ) Leg press double leg 100 lbs 2 x 15, single leg x 15 50 lbs - performed bilaterally  Lateral step up/down 4 inch step x 15 - performed bilaterally  Sit to stand 18 inch no UE assist slow lowering focus x 5     PATIENT EDUCATION:  04/28/2023 Education details: HEP, POC Person educated: Patient Education method: Programmer, multimedia, Demonstration, Verbal cues, and Handouts Education comprehension: verbalized understanding, returned demonstration, and verbal cues required  HOME EXERCISE PROGRAM: Access Code: [email or web address] URL: [email or web address] Date: 04/28/2023 Prepared by: Chyrel Masson  Exercises - Supine Lower Trunk Rotation  - 1-2 x daily - 7 x weekly - 1 sets - 3-5 reps - 15 hold - Supine Bridge  - 1-2 x daily - 7 x weekly - 1-2 sets - 10 reps - 2 hold - Supine Single Knee to Chest Stretch  - 1-2 x daily - 7 x weekly - 1 sets - 5 reps - 15 hold - Seated Quad Set  - 1-2 x daily - 7 x weekly - 1 sets - 10 reps  - 5 hold - Seated Straight Leg Heel Taps  - 1-2 x daily - 7 x weekly - 3 sets - 10 reps - Sit to Stand  - 3 x daily - 7 x weekly - 1 sets - 10 reps - Standing Hip Hiking  - 1-2 x daily - 7 x weekly - 1 sets - 10 reps - 5 hold  ASSESSMENT:  CLINICAL IMPRESSION: FOTO score improved related to back.  Pt demonstrated some improvement in quality of knee flexion bilaterally.  Continued skilled PT services to promote improved strength and mobility.  OBJECTIVE IMPAIRMENTS: Abnormal gait, decreased activity tolerance, decreased balance, decreased coordination, decreased endurance, decreased mobility, difficulty walking,  decreased ROM, decreased strength, hypomobility, increased fascial restrictions, impaired perceived functional ability, impaired flexibility, improper body mechanics, postural dysfunction, and pain.   ACTIVITY LIMITATIONS: carrying, lifting, bending, sitting, standing, squatting, sleeping, stairs, transfers, bed mobility, locomotion level, and caring for others  PARTICIPATION LIMITATIONS: meal prep, cleaning, laundry, interpersonal relationship, shopping, community activity, and occupation  PERSONAL FACTORS: Time since onset of injury/illness/exacerbation are also affecting patient's functional outcome.   REHAB POTENTIAL: Good  CLINICAL DECISION MAKING: Stable/uncomplicated  EVALUATION COMPLEXITY: Low   GOALS: Goals reviewed with patient? Yes  SHORT TERM GOALS: (target date for Short term goals are 3 weeks 05/19/2023)  1. Patient will demonstrate independent use of home exercise program to maintain progress from in clinic treatments.  Goal status: met 05/19/2023  LONG TERM GOALS: (target dates for all long term goals are 10 weeks  07/07/2023 )   1. Patient will demonstrate/report pain at worst less than or equal to 2/10 to facilitate minimal limitation in daily activity secondary to pain symptoms.  Goal status: on going 05/26/2023   2. Patient will demonstrate independent  use of home exercise program to facilitate ability to maintain/progress functional gains from skilled physical therapy services.  Goal status: on going 05/26/2023   3. Patient will demonstrate FOTO outcome > or = 65 % to indicate reduced disability due to condition.  Goal status: on going 05/26/2023   4. Patient will demonstrate lumbar extension 100 % WFL s symptoms to facilitate upright standing, walking posture at PLOF s limitation.  Goal status: on going 05/26/2023   5.  Patient will demonstrate bilateral knee extension MMT 5/5, dynamometry testing > 45 lbs to facilitate stairs, ambulation and other daily activity at PLOF.   Goal status: on going 05/26/2023   6.  Patient will demonstrate bilateral SLS > 15 seconds to improve stability in ambulation.  Goal status: on going 05/26/2023   7.  Patient will demonstrate ascending/descending stairs reciprocally s UE assist for community integration.   Goal Status: on going 05/26/2023  PLAN:  PT FREQUENCY: 1-2x/week  PT DURATION: 10 weeks  PLANNED INTERVENTIONS: Can include 28413- PT Re-evaluation, 97110-Therapeutic exercises, 97530- Therapeutic activity, 97112- Neuromuscular re-education, 97535- Self Care, 97140- Manual therapy, [phone]- Gait training, [phone]- Orthotic Fit/training, [id number]- Canalith repositioning, [id number]- Aquatic Therapy, [phone]- Electrical stimulation (unattended), 97750 Physical performance testing, [id number]- Electrical stimulation (manual), 97016- Vasopneumatic device, [id number]- Ultrasound, [id number]- Traction (mechanical), [id number]- Ionotophoresis 4mg /ml Dexamethasone, Patient/Family education, Balance training, Stair training, Taping, Dry Needling, Joint mobilization, Joint manipulation, Spinal manipulation, Spinal mobilization, Scar mobilization, Vestibular training, Visual/preceptual remediation/compensation, DME instructions, Cryotherapy, and Moist heat.  All performed as medically necessary.  All included unless contraindicated  PLAN FOR  NEXT SESSION: Percussive device, progressive strengthening.   Chyrel Masson, PT, DPT, OCS, ATC 05/28/23  2:32 PM

## 2023-06-02 ENCOUNTER — Encounter: Payer: Self-pay | Admitting: Rehabilitative and Restorative Service Providers"

## 2023-06-02 ENCOUNTER — Ambulatory Visit (INDEPENDENT_AMBULATORY_CARE_PROVIDER_SITE_OTHER): Payer: Medicare Other | Admitting: Rehabilitative and Restorative Service Providers"

## 2023-06-02 DIAGNOSIS — R262 Difficulty in walking, not elsewhere classified: Secondary | ICD-10-CM | POA: Diagnosis not present

## 2023-06-02 DIAGNOSIS — M79605 Pain in left leg: Secondary | ICD-10-CM

## 2023-06-02 DIAGNOSIS — M5459 Other low back pain: Secondary | ICD-10-CM | POA: Diagnosis not present

## 2023-06-02 DIAGNOSIS — M6281 Muscle weakness (generalized): Secondary | ICD-10-CM

## 2023-06-02 NOTE — Therapy (Signed)
 OUTPATIENT PHYSICAL THERAPY TREATMENT   Patient Name: Krystal Lang MRN: [id number] DOB:November 02, 1955, 68 y.o., female Today's Date: 06/02/2023  END OF SESSION:  PT End of Session - 06/02/23 1341     Visit Number 6    Number of Visits 20    Date for PT Re-Evaluation 07/07/23    Authorization Type Medicare/ [employer]ET[employer]    Progress Note Due on Visit 10    PT Start Time 1343    PT Stop Time 1422    PT Time Calculation (min) 39 min    Activity Tolerance Patient tolerated treatment well    Behavior During Therapy WFL for tasks assessed/performed                  Past Medical History:  Diagnosis Date   Anxiety    Arthritis    Cataract    Mixed form OU   Complication of anesthesia    after knee arthroscopy pateint difficult to wake up   Diabetes mellitus    Fibroid uterus 2001   H/O fatigue 1998   Headache(784.0)    denies   Hepatitis    A, 30 years ago   Hirsutism 1998   Idiopathic   History of chicken pox    Hypothyroidism    Menses, irregular 1998   Past Surgical History:  Procedure Laterality Date   COLONOSCOPY  02/2021   DILATION AND CURETTAGE OF UTERUS  03/31/2000   In [place]   KNEE ARTHROPLASTY Right    Replaced in [place], [place]   KNEE ARTHROSCOPY Right 2008   TONSILLECTOMY     As a child   TOTAL KNEE ARTHROPLASTY Left 08/21/2022   Procedure: LEFT TOTAL KNEE ARTHROPLASTY;  Surgeon: Kathryne Hitch, MD;  Location: [facility];  Service: Orthopedics;  Laterality: Left;   Patient Active Problem List   Diagnosis Date Noted   Status post total left knee replacement 08/21/2022   Dyspareunia, female 11/13/2011   Type 2 diabetes mellitus (HCC) 11/13/2011   Alopecia 11/13/2011   History of chicken pox    Headache    Hypothyroidism    Menses, irregular    Hirsutism    H/O fatigue    Fibroid uterus     PCP: Orpah Cobb MD  REFERRING PROVIDER: London Sheer, MD  REFERRING DIAG: M54.50 (ICD-10-CM) - Low back pain, unspecified back pain laterality,  unspecified chronicity, unspecified whether sciatica present  Rationale for Evaluation and Treatment: Rehabilitation  THERAPY DIAG:  Other low back pain  Pain in left leg  Muscle weakness (generalized)  Difficulty in walking, not elsewhere classified  ONSET DATE: 07/2022  SUBJECTIVE:  SUBJECTIVE STATEMENT: Pt indicated having complaints from both knees today, achy feeling.  Pt indicated having similar complaints to touch in front of Rt knee on patella.  Pt indicated symptoms in back/buttock seemed to be helped by treatment last time.  Lateral hip/thigh improving but lateral knee still troublesome on Lt.   PERTINENT HISTORY:  PMH significant for anxiety, OA, DM, hypothyroidism, and Lt TKA 08/21/22.  Rt TKA  PAIN:  NPRS scale:  no specific number given upon arrival.  Pain location: back/hip, lt leg  Pain description: constant, achy Aggravating factors: standing, walking, stairs, sitting prolonged at times for back.  WB pressure Relieving factors: nothing specific   PRECAUTIONS: None  WEIGHT BEARING RESTRICTIONS: No  FALLS:  Has patient fallen in last 6 months? 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   PLOF: Independent, gym based activity  PATIENT GOALS: Get pain better, walk for exercise, go to gym  OBJECTIVE:   PATIENT SURVEYS:  05/28/2023: FOTO update:  67  04/28/2023 FOTO eval: 56   predicted:  65  SCREENING FOR RED FLAGS: 04/28/2023 Bowel or bladder incontinence: No Cauda equina syndrome: No  COGNITION: 04/28/2023 Overall cognitive status: WFL normal      SENSATION: 04/28/2023 Not tested  MUSCLE LENGTH: 04/28/2023 Not performed today.   POSTURE:   04/28/2023 Reduced lumbar lordosis in standing.   PALPATION: 04/28/2023 Tenderness noted in distal Lt quad, proximal incision. Glute max/med and side of thigh also tender on Lt side  LUMBAR ROM:  04/28/2023 Directional Preference Assessment: Centralization: Peripheralization:   AROM 04/28/2023 05/19/2023  Flexion To ankles no complaints    Extension 75% WFL c Rt lumbar pain noted (different than chief complaint)  100 % WFL  Right lateral flexion    Left lateral flexion    Right rotation To knee joint   Left rotation To lateral femoral epicondyle     (Blank rows = not tested)  LOWER EXTREMITY ROM:      Right 04/28/2023 Left 04/28/2023 Right 05/19/2023 Left 05/19/2023 Right 05/28/2023 Left 05/28/2023  Hip flexion        Hip extension        Hip abduction        Hip adduction        Hip internal rotation        Hip external rotation        Knee flexion  102 AROM in heel slide 100 AROM heel slide 105 AROM heel slide 107 AROM heel slide 109 AROM heel slide  Knee extension  0 AROM seated heel slide      Ankle dorsiflexion        Ankle plantarflexion        Ankle inversion        Ankle eversion         (Blank rows = not tested)  LOWER EXTREMITY MMT:    MMT Right 04/28/2023 Left 04/28/2023 Right 06/02/2023 Left 06/02/2023  Hip flexion 5/5 5/5    Hip extension      Hip abduction      Hip adduction      Hip internal rotation      Hip external rotation      Knee flexion 5/5 5/5    Knee extension 5/5 38.9, 41 lbs 4+/5 28, 24.3 lbs 5/5 42, 41.4 lbs 5/5 35, 31.3 lbs  Ankle dorsiflexion 5/5 5/5    Ankle plantarflexion  Ankle inversion      Ankle eversion       (Blank rows = not tested)   SPECIAL TESTS:  04/28/2023 No specific testing  FUNCTIONAL TESTS:  04/28/2023 18 inch chair transfer s UE assist but pain in knees noted.  Lt SLS 8 seconds Rt SLS 10 seconds  GAIT: 04/28/2023 Reduced TKE in stance on Lt leg with trendelenburg gait on Lt.                                                                                                                                                                                                                    TODAY'S TREATMENT:                                                                                                         DATE:  06/02/2023 Therex: UBE UE/LE seat 8 for ROM/mobility 9 lvl 2.5  Seated quad set with SLR 2x 15 bilateral   Reviewed handout with HEP updates and time spent in question/answer.   Manual Percussive device Lt lateral hip/thigh, Rt posterior hip for soft tissue mobilization/ trigger point release.   TherActivity (to improve transfers, ambulation, stairs and other daily mobility ) Step up fwd bottom step of staircase (6-7 inch height) x 10 bilateral, lateral step down x 10 bilateral. Sit to stand to sit 18x without UE assist  Additional time for education and home use of step up for activity with stairs/transfers.   TODAY'S TREATMENT:  DATE:  05/28/2023 Therex: UBE UE/LE seat 8 for ROM/mobility 10 mins lvl 3 Supine hooklying bridge  2 x 10  Supine lumbar trunk rotation 15 sec x 3 bilateral   Manual Percussive device Lt lateral hip/thigh, Rt posterior hip for soft tissue mobilization/ trigger point release.   Seated knee flexion c IR/distraction bilateral  TherActivity (to improve transfers, ambulation, stairs and other daily mobility ) Leg press double leg 100 lbs x 20, single leg 2 x 15  50 lbs - performed bilaterally   TODAY'S TREATMENT:                                                                                                         DATE:  05/26/2023 Therex: UBE UE/LE seat 8 for ROM/mobility 10 mins lvl 3 Supine hooklying bridge c blue  band hip abduction hold x 20  Single knee to opposite shoulder supine stretch 15 sec x 3 bilateral  Seated quad set with SLR x 15 bilateral  with slow movement focus. 1 lb   Manual Percussive device Lt lateral hip/thigh for soft tissue mobilization/ trigger point release.  Education and use of tennis ball pressure for at home use.   TherActivity (to improve transfers, ambulation, stairs and other daily mobility ) Leg press double leg 100 lbs x 20, single leg 2 x 15  50 lbs - performed bilaterally    TODAY'S TREATMENT:                                                                                                         DATE:  05/21/2023 Therex: UBE UE/LE seat 9 for ROM/mobility 10 mins lvl 3 Supine hooklying lumbar trunk rotation stretch 15 sec x 3 bilateral  Supine hooklying bridge c green band hip abduction hold x 20  Seated Lt knee AAROM overpressure from Rt leg 15 sec hold x 3 Seated quad set c SLR 2 x 10 bilaterally  1l b   Continued emphasis on routine HEP , particularly for symptom relief when symptoms are more present.  Continued education on non surgical reasoning for symptoms (muscle tightness, weakness, mobility deficits, etc) vs. Something "being wrong with surgery."   Neuro Re-ed (to improve muscle activation, balance control Tandem stance 1 min x 2 bilateral on foam in // bars with occasional HHA  TherActivity (to improve transfers, ambulation, stairs and other daily mobility ) Leg press double leg 100 lbs 2 x 15, single leg 2 x 10  50 lbs - performed bilaterally  Step on over and down 4 inch step x 15 each LE WB.  Sit to stand to sit 18 inch x  15 with slow lowering focus.   PATIENT EDUCATION:  06/02/2023 Education details: HEP, POC Person educated: Patient Education method: Programmer, multimedia, Demonstration, Verbal cues, and Handouts Education comprehension: verbalized understanding, returned demonstration, and verbal cues required  HOME EXERCISE PROGRAM: Access Code: [email or web address] URL: [email or web address] Date: 06/02/2023 Prepared by: Chyrel Masson  Exercises - Supine Lower Trunk Rotation  - 1-2  x daily - 7 x weekly - 1 sets - 3-5 reps - 15 hold - Supine Bridge  - 1-2 x daily - 7 x weekly - 1-2 sets - 10 reps - 2 hold - Supine Single Knee to Chest Stretch  - 1-2 x daily - 7 x weekly - 1 sets - 5 reps - 15 hold - Seated Quad Set  - 1-2 x daily - 7 x weekly - 1 sets - 10 reps - 5 hold - Seated Straight Leg Heel Taps  - 1-2 x daily - 7 x weekly - 3 sets - 10 reps - Sit to Stand  - 3 x daily - 7 x weekly - 1 sets - 10 reps - Standing Hip Hiking  - 1-2 x daily - 7 x weekly - 1 sets - 10 reps - 5 hold - Step Up  - 1 x daily - 4-5 x weekly - 2-3 sets - 10-15 reps - Lateral Step Up  - 1 x daily - 4-5 x weekly - 2-3 sets - 10-15 reps  ASSESSMENT:  CLINICAL IMPRESSION: Spent additional time today in review of key HEP activity to promote continued strength gains.  Handout provided again.  Strength testing showed mild improvements, Lt> Rt.  Continued deficits compared to the normal value of approx. 40% body weight.   OBJECTIVE IMPAIRMENTS: Abnormal gait, decreased activity tolerance, decreased balance, decreased coordination, decreased endurance, decreased mobility, difficulty walking, decreased ROM, decreased strength, hypomobility, increased fascial restrictions, impaired perceived functional ability, impaired flexibility, improper body mechanics, postural dysfunction, and pain.   ACTIVITY LIMITATIONS: carrying, lifting, bending, sitting, standing, squatting, sleeping, stairs, transfers, bed mobility, locomotion level, and caring for others  PARTICIPATION LIMITATIONS: meal prep, cleaning, laundry, interpersonal relationship, shopping, community activity, and occupation  PERSONAL FACTORS: Time since onset of injury/illness/exacerbation are also affecting patient's functional outcome.   REHAB POTENTIAL: Good  CLINICAL DECISION MAKING: Stable/uncomplicated  EVALUATION COMPLEXITY: Low   GOALS: Goals reviewed with patient? Yes  SHORT TERM GOALS: (target date for Short term goals are 3  weeks 05/19/2023)  1. Patient will demonstrate independent use of home exercise program to maintain progress from in clinic treatments.  Goal status: met 05/19/2023  LONG TERM GOALS: (target dates for all long term goals are 10 weeks  07/07/2023 )   1. Patient will demonstrate/report pain at worst less than or equal to 2/10 to facilitate minimal limitation in daily activity secondary to pain symptoms.  Goal status: on going 05/26/2023   2. Patient will demonstrate independent use of home exercise program to facilitate ability to maintain/progress functional gains from skilled physical therapy services.  Goal status: on going 05/26/2023   3. Patient will demonstrate FOTO outcome > or = 65 % to indicate reduced disability due to condition.  Goal status: on going 05/26/2023   4. Patient will demonstrate lumbar extension 100 % WFL s symptoms to facilitate upright standing, walking posture at PLOF s limitation.  Goal status: on going 05/26/2023   5.  Patient will demonstrate bilateral knee extension MMT 5/5, dynamometry testing > 45 lbs to facilitate stairs, ambulation and other daily activity at PLOF.  Goal status: on going 05/26/2023   6.  Patient will demonstrate bilateral SLS > 15 seconds to improve stability in ambulation.  Goal status: on going 05/26/2023   7.  Patient will demonstrate ascending/descending stairs reciprocally s UE assist for community integration.   Goal Status: on going 05/26/2023  PLAN:  PT FREQUENCY: 1-2x/week  PT DURATION: 10 weeks  PLANNED INTERVENTIONS: Can include 16109- PT Re-evaluation, 97110-Therapeutic exercises, 97530- Therapeutic activity, 97112- Neuromuscular re-education, 97535- Self Care, 97140- Manual therapy, [phone]- Gait training, [phone]- Orthotic Fit/training, [phone]- Canalith repositioning, [id number]- Aquatic Therapy, [phone]- Electrical stimulation (unattended), 97750 Physical performance testing, [phone]- Electrical stimulation (manual), 97016-  Vasopneumatic device, [id number]- Ultrasound, [id number]- Traction (mechanical), [id number]- Ionotophoresis 4mg /ml Dexamethasone, Patient/Family education, Balance training, Stair training, Taping, Dry Needling, Joint mobilization, Joint manipulation, Spinal manipulation, Spinal mobilization, Scar mobilization, Vestibular training, Visual/preceptual remediation/compensation, DME instructions, Cryotherapy, and Moist heat.  All performed as medically necessary.  All included unless contraindicated  PLAN FOR NEXT SESSION: Leg press, leg extension  Chyrel Masson, PT, DPT, OCS, ATC 06/02/23  2:23 PM

## 2023-06-04 ENCOUNTER — Ambulatory Visit: Payer: Medicare Other | Admitting: Rehabilitative and Restorative Service Providers"

## 2023-06-04 ENCOUNTER — Encounter: Payer: Self-pay | Admitting: Rehabilitative and Restorative Service Providers"

## 2023-06-04 DIAGNOSIS — R262 Difficulty in walking, not elsewhere classified: Secondary | ICD-10-CM | POA: Diagnosis not present

## 2023-06-04 DIAGNOSIS — M6281 Muscle weakness (generalized): Secondary | ICD-10-CM

## 2023-06-04 DIAGNOSIS — M79605 Pain in left leg: Secondary | ICD-10-CM | POA: Diagnosis not present

## 2023-06-04 DIAGNOSIS — M5459 Other low back pain: Secondary | ICD-10-CM

## 2023-06-04 NOTE — Therapy (Signed)
 OUTPATIENT PHYSICAL THERAPY TREATMENT   Patient Name: Krystal Lang MRN: [id number] DOB:06-May-1955, 68 y.o., female Today's Date: 06/04/2023  END OF SESSION:  PT End of Session - 06/04/23 1351     Visit Number 7    Number of Visits 20    Date for PT Re-Evaluation 07/07/23    Authorization Type Medicare/ [employer]ET[employer]    Progress Note Due on Visit 10    PT Start Time 1345    PT Stop Time 1425    PT Time Calculation (min) 40 min    Activity Tolerance Patient tolerated treatment well    Behavior During Therapy WFL for tasks assessed/performed                   Past Medical History:  Diagnosis Date   Anxiety    Arthritis    Cataract    Mixed form OU   Complication of anesthesia    after knee arthroscopy pateint difficult to wake up   Diabetes mellitus    Fibroid uterus 2001   H/O fatigue 1998   Headache(784.0)    denies   Hepatitis    A, 30 years ago   Hirsutism 1998   Idiopathic   History of chicken pox    Hypothyroidism    Menses, irregular 1998   Past Surgical History:  Procedure Laterality Date   COLONOSCOPY  02/2021   DILATION AND CURETTAGE OF UTERUS  03/31/2000   In [place]   KNEE ARTHROPLASTY Right    Replaced in [place], [place]   KNEE ARTHROSCOPY Right 2008   TONSILLECTOMY     As a child   TOTAL KNEE ARTHROPLASTY Left 08/21/2022   Procedure: LEFT TOTAL KNEE ARTHROPLASTY;  Surgeon: Kathryne Hitch, MD;  Location: [facility];  Service: Orthopedics;  Laterality: Left;   Patient Active Problem List   Diagnosis Date Noted   Status post total left knee replacement 08/21/2022   Dyspareunia, female 11/13/2011   Type 2 diabetes mellitus (HCC) 11/13/2011   Alopecia 11/13/2011   History of chicken pox    Headache    Hypothyroidism    Menses, irregular    Hirsutism    H/O fatigue    Fibroid uterus     PCP: Orpah Cobb MD  REFERRING PROVIDER: London Sheer, MD  REFERRING DIAG: M54.50 (ICD-10-CM) - Low back pain, unspecified back pain laterality,  unspecified chronicity, unspecified whether sciatica present  Rationale for Evaluation and Treatment: Rehabilitation  THERAPY DIAG:  Other low back pain  Pain in left leg  Muscle weakness (generalized)  Difficulty in walking, not elsewhere classified  ONSET DATE: 07/2022  SUBJECTIVE:  SUBJECTIVE STATEMENT: Pt indicated having complaints in Lt lumbar upon waking this morning.  Some better to this point but 2/10.   Pt indicated concern about knee cap tenderness on Rt .   PERTINENT HISTORY:  PMH significant for anxiety, OA, DM, hypothyroidism, and Lt TKA 08/21/22.  Rt TKA  PAIN:  NPRS scale:  2/10 Pain location:Lt back Pain description: constant, achy Aggravating factors: standing, walking, stairs, sitting prolonged at times for back.  WB pressure Relieving factors: nothing specific   PRECAUTIONS: None  WEIGHT BEARING RESTRICTIONS: No  FALLS:  Has patient fallen in last 6 months? 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   PLOF: Independent, gym based activity  PATIENT GOALS: Get pain better, walk for exercise, go to gym  OBJECTIVE:   PATIENT SURVEYS:  05/28/2023: FOTO update:  67  04/28/2023 FOTO eval: 56   predicted:  65  SCREENING FOR RED FLAGS: 04/28/2023 Bowel or bladder incontinence: No Cauda equina syndrome: No  COGNITION: 04/28/2023 Overall cognitive status: WFL normal      SENSATION: 04/28/2023 Not tested  MUSCLE LENGTH: 04/28/2023 Not performed today.   POSTURE:  04/28/2023 Reduced lumbar lordosis in standing.   PALPATION: 04/28/2023 Tenderness noted in distal Lt quad, proximal incision. Glute max/med and side of thigh also tender on Lt  side  LUMBAR ROM:  04/28/2023 Directional Preference Assessment: Centralization: Peripheralization:   AROM 04/28/2023 05/19/2023  Flexion To ankles no complaints    Extension 75% WFL c Rt lumbar pain noted (different than chief complaint)  100 % WFL  Right lateral flexion    Left lateral flexion    Right rotation To knee joint   Left rotation To lateral femoral epicondyle     (Blank rows = not tested)  LOWER EXTREMITY ROM:      Right 04/28/2023 Left 04/28/2023 Right 05/19/2023 Left 05/19/2023 Right 05/28/2023 Left 05/28/2023  Hip flexion        Hip extension        Hip abduction        Hip adduction        Hip internal rotation        Hip external rotation        Knee flexion  102 AROM in heel slide 100 AROM heel slide 105 AROM heel slide 107 AROM heel slide 109 AROM heel slide  Knee extension  0 AROM seated heel slide      Ankle dorsiflexion        Ankle plantarflexion        Ankle inversion        Ankle eversion         (Blank rows = not tested)  LOWER EXTREMITY MMT:    MMT Right 04/28/2023 Left 04/28/2023 Right 06/02/2023 Left 06/02/2023  Hip flexion 5/5 5/5    Hip extension      Hip abduction      Hip adduction      Hip internal rotation      Hip external rotation      Knee flexion 5/5 5/5    Knee extension 5/5 38.9, 41 lbs 4+/5 28, 24.3 lbs 5/5 42, 41.4 lbs 5/5 35, 31.3 lbs  Ankle dorsiflexion 5/5 5/5    Ankle plantarflexion      Ankle inversion      Ankle eversion       (Blank rows = not tested)   SPECIAL TESTS:  04/28/2023 No specific testing  FUNCTIONAL TESTS:  04/28/2023 18 inch chair transfer s UE assist but pain in knees noted.  Lt SLS 8 seconds Rt SLS 10 seconds  GAIT: 04/28/2023 Reduced TKE in stance on Lt leg with trendelenburg gait on Lt.                                                                                                                                                                                                                    TODAY'S TREATMENT:                                                                                                         DATE:  06/04/2023 Therex: UBE UE/LE seat 8 for ROM/mobility 9 lvl 3.0 to 4.0 Supine Lt hamstring isometric hip extension into table 10 sec hold x 10  Supine SKC 15 sec x 5 Lt leg  Review of advice on routine of HEP (frequency, timing)   Neuro Re-ed SLS with reactive blaze pod light tapping contralateral leg in semicircle anteriorly 3 lights 30 sec x 3 bialteral with rest breaks SLS with contralateral leg step over and back 6 inch hurdle 2 x 10 bilaterally   Manual Percussive device Lt lateral hip/thigh, Lt posterior hip for soft tissue mobilization/ trigger point release.   TherActivity (to improve transfers, ambulation, stairs and other daily mobility ) Leg press double leg 106 lbs x 20 ,  single leg 2 x 15 56 lbs performed bilaterally   TODAY'S TREATMENT:  DATE:  06/02/2023 Therex: UBE UE/LE seat 8 for ROM/mobility 9 lvl 2.5  Seated quad set with SLR 2x 15 bilateral   Reviewed handout with HEP updates and time spent in question/answer.   Manual Percussive device Lt lateral hip/thigh, Rt posterior hip for soft tissue mobilization/ trigger point release.   TherActivity (to improve transfers, ambulation, stairs and other daily mobility ) Step up fwd bottom step of staircase (6-7 inch height) x 10 bilateral, lateral step down x 10 bilateral. Sit to stand to sit 18x without UE assist  Additional time for education and home use of step up for activity with stairs/transfers.   TODAY'S TREATMENT:                                                                                                         DATE:  05/28/2023 Therex: UBE UE/LE seat 8 for ROM/mobility 10 mins lvl 3 Supine hooklying bridge  2 x 10  Supine lumbar trunk rotation 15 sec x 3 bilateral    Manual Percussive device Lt lateral hip/thigh, Rt posterior hip for soft tissue mobilization/ trigger point release.   Seated knee flexion c IR/distraction bilateral  TherActivity (to improve transfers, ambulation, stairs and other daily mobility ) Leg press double leg 100 lbs x 20, single leg 2 x 15  50 lbs - performed bilaterally   TODAY'S TREATMENT:                                                                                                         DATE:  05/26/2023 Therex: UBE UE/LE seat 8 for ROM/mobility 10 mins lvl 3 Supine hooklying bridge c blue  band hip abduction hold x 20  Single knee to opposite shoulder supine stretch 15 sec x 3 bilateral  Seated quad set with SLR x 15 bilateral with slow movement focus. 1 lb   Manual Percussive device Lt lateral hip/thigh for soft tissue mobilization/ trigger point release.  Education and use of tennis ball pressure for at home use.   TherActivity (to improve transfers, ambulation, stairs and other daily mobility ) Leg press double leg 100 lbs x 20, single leg 2 x 15  50 lbs - performed bilaterally    PATIENT EDUCATION:  06/02/2023 Education details: HEP, POC Person educated: Patient Education method: Programmer, multimedia, Facilities manager, Verbal cues, and Handouts Education comprehension: verbalized understanding, returned demonstration, and verbal cues required  HOME EXERCISE PROGRAM: Access Code: [email or web address] URL: [email or web address] Date: 06/02/2023 Prepared by: Chyrel Masson  Exercises - Supine Lower Trunk Rotation  - 1-2 x daily - 7 x weekly - 1 sets - 3-5 reps - 15 hold - Supine  Bridge  - 1-2 x daily - 7 x weekly - 1-2 sets - 10 reps - 2 hold - Supine Single Knee to Chest Stretch  - 1-2 x daily - 7 x weekly - 1 sets - 5 reps - 15 hold - Seated Quad Set  - 1-2 x daily - 7 x weekly - 1 sets - 10 reps - 5 hold - Seated Straight Leg Heel Taps  - 1-2 x daily - 7 x weekly - 3 sets - 10 reps - Sit to Stand  - 3 x daily - 7  x weekly - 1 sets - 10 reps - Standing Hip Hiking  - 1-2 x daily - 7 x weekly - 1 sets - 10 reps - 5 hold - Step Up  - 1 x daily - 4-5 x weekly - 2-3 sets - 10-15 reps - Lateral Step Up  - 1 x daily - 4-5 x weekly - 2-3 sets - 10-15 reps  ASSESSMENT:  CLINICAL IMPRESSION: Pt to continue to benefit from progressive WB strengthening and balance improvements to help improve functional mobility control.  Isometrics introduced for distal hamstring to address complaints related to possible posterior/lateral tendon irritations.  Myofascial release techniques still proving helpful for back/hip related symptoms reported upon arrival.   OBJECTIVE IMPAIRMENTS: Abnormal gait, decreased activity tolerance, decreased balance, decreased coordination, decreased endurance, decreased mobility, difficulty walking, decreased ROM, decreased strength, hypomobility, increased fascial restrictions, impaired perceived functional ability, impaired flexibility, improper body mechanics, postural dysfunction, and pain.   ACTIVITY LIMITATIONS: carrying, lifting, bending, sitting, standing, squatting, sleeping, stairs, transfers, bed mobility, locomotion level, and caring for others  PARTICIPATION LIMITATIONS: meal prep, cleaning, laundry, interpersonal relationship, shopping, community activity, and occupation  PERSONAL FACTORS: Time since onset of injury/illness/exacerbation are also affecting patient's functional outcome.   REHAB POTENTIAL: Good  CLINICAL DECISION MAKING: Stable/uncomplicated  EVALUATION COMPLEXITY: Low   GOALS: Goals reviewed with patient? Yes  SHORT TERM GOALS: (target date for Short term goals are 3 weeks 05/19/2023)  1. Patient will demonstrate independent use of home exercise program to maintain progress from in clinic treatments.  Goal status: met 05/19/2023  LONG TERM GOALS: (target dates for all long term goals are 10 weeks  07/07/2023 )   1. Patient will demonstrate/report pain at worst  less than or equal to 2/10 to facilitate minimal limitation in daily activity secondary to pain symptoms.  Goal status: on going 05/26/2023   2. Patient will demonstrate independent use of home exercise program to facilitate ability to maintain/progress functional gains from skilled physical therapy services.  Goal status: on going 05/26/2023   3. Patient will demonstrate FOTO outcome > or = 65 % to indicate reduced disability due to condition.  Goal status: on going 05/26/2023   4. Patient will demonstrate lumbar extension 100 % WFL s symptoms to facilitate upright standing, walking posture at PLOF s limitation.  Goal status: on going 05/26/2023   5.  Patient will demonstrate bilateral knee extension MMT 5/5, dynamometry testing > 45 lbs to facilitate stairs, ambulation and other daily activity at PLOF.   Goal status: on going 05/26/2023   6.  Patient will demonstrate bilateral SLS > 15 seconds to improve stability in ambulation.  Goal status: on going 05/26/2023   7.  Patient will demonstrate ascending/descending stairs reciprocally s UE assist for community integration.   Goal Status: on going 05/26/2023  PLAN:  PT FREQUENCY: 1-2x/week  PT DURATION: 10 weeks  PLANNED INTERVENTIONS: Can include 81191- PT Re-evaluation,  97110-Therapeutic exercises, 97530- Therapeutic activity, [id number]- Neuromuscular re-education, [phone]- Self Care, 60454- Manual therapy, [phone]- Gait training, [phone]- Orthotic Fit/training, [phone]- Canalith repositioning, [id number]- Aquatic Therapy, [phone]- Electrical stimulation (unattended), 97750 Physical performance testing, [phone]- Electrical stimulation (manual), 97016- Vasopneumatic device, [id number]- Ultrasound, [id number]- Traction (mechanical), [id number]- Ionotophoresis 4mg /ml Dexamethasone, Patient/Family education, Balance training, Stair training, Taping, Dry Needling, Joint mobilization, Joint manipulation, Spinal manipulation, Spinal mobilization, Scar mobilization, Vestibular  training, Visual/preceptual remediation/compensation, DME instructions, Cryotherapy, and Moist heat.  All performed as medically necessary.  All included unless contraindicated  PLAN FOR NEXT SESSION: Leg extension machine, curl for eccentric loadings.   Chyrel Masson, PT, DPT, OCS, ATC 06/04/23  2:27 PM

## 2023-06-09 ENCOUNTER — Encounter: Payer: Self-pay | Admitting: Rehabilitative and Restorative Service Providers"

## 2023-06-09 ENCOUNTER — Ambulatory Visit (INDEPENDENT_AMBULATORY_CARE_PROVIDER_SITE_OTHER): Payer: Medicare Other | Admitting: Rehabilitative and Restorative Service Providers"

## 2023-06-09 DIAGNOSIS — R262 Difficulty in walking, not elsewhere classified: Secondary | ICD-10-CM

## 2023-06-09 DIAGNOSIS — M79605 Pain in left leg: Secondary | ICD-10-CM | POA: Diagnosis not present

## 2023-06-09 DIAGNOSIS — M6281 Muscle weakness (generalized): Secondary | ICD-10-CM | POA: Diagnosis not present

## 2023-06-09 DIAGNOSIS — M5459 Other low back pain: Secondary | ICD-10-CM

## 2023-06-09 NOTE — Therapy (Signed)
 OUTPATIENT PHYSICAL THERAPY TREATMENT   Patient Name: Krystal Lang MRN: [id number] DOB:1955/07/21, 68 y.o., female Today's Date: 06/09/2023  END OF SESSION:  PT End of Session - 06/09/23 1342     Visit Number 8    Number of Visits 20    Date for PT Re-Evaluation 07/07/23    Authorization Type Medicare/ [employer]ET[employer]    Progress Note Due on Visit 10    PT Start Time 1344    PT Stop Time 1424    PT Time Calculation (min) 40 min    Activity Tolerance Patient tolerated treatment well    Behavior During Therapy WFL for tasks assessed/performed                    Past Medical History:  Diagnosis Date   Anxiety    Arthritis    Cataract    Mixed form OU   Complication of anesthesia    after knee arthroscopy pateint difficult to wake up   Diabetes mellitus    Fibroid uterus 2001   H/O fatigue 1998   Headache(784.0)    denies   Hepatitis    A, 30 years ago   Hirsutism 1998   Idiopathic   History of chicken pox    Hypothyroidism    Menses, irregular 1998   Past Surgical History:  Procedure Laterality Date   COLONOSCOPY  02/2021   DILATION AND CURETTAGE OF UTERUS  03/31/2000   In [place]   KNEE ARTHROPLASTY Right    Replaced in [place], [place]   KNEE ARTHROSCOPY Right 2008   TONSILLECTOMY     As a child   TOTAL KNEE ARTHROPLASTY Left 08/21/2022   Procedure: LEFT TOTAL KNEE ARTHROPLASTY;  Surgeon: Kathryne Hitch, MD;  Location: [facility];  Service: Orthopedics;  Laterality: Left;   Patient Active Problem List   Diagnosis Date Noted   Status post total left knee replacement 08/21/2022   Dyspareunia, female 11/13/2011   Type 2 diabetes mellitus (HCC) 11/13/2011   Alopecia 11/13/2011   History of chicken pox    Headache    Hypothyroidism    Menses, irregular    Hirsutism    H/O fatigue    Fibroid uterus     PCP: Orpah Cobb MD  REFERRING PROVIDER: London Sheer, MD  REFERRING DIAG: M54.50 (ICD-10-CM) - Low back pain, unspecified back pain  laterality, unspecified chronicity, unspecified whether sciatica present  Rationale for Evaluation and Treatment: Rehabilitation  THERAPY DIAG:  Other low back pain  Pain in left leg  Muscle weakness (generalized)  Difficulty in walking, not elsewhere classified  ONSET DATE: 07/2022  SUBJECTIVE:  SUBJECTIVE STATEMENT: Pt indicated feeling back comes and goes but doing better than before.  Pt indicated Lt leg seemed to be getting some stronger with stairs.    PERTINENT HISTORY:  PMH significant for anxiety, OA, DM, hypothyroidism, and Lt TKA 08/21/22.  Rt TKA  PAIN:  NPRS scale:  very mild.  Pain location:back, Lt knee Pain description: constant, achy Aggravating factors: standing, walking, stairs, sitting prolonged at times for back.  WB pressure Relieving factors: nothing specific   PRECAUTIONS: None  WEIGHT BEARING RESTRICTIONS: No  FALLS:  Has patient fallen in last 6 months? 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   PLOF: Independent, gym based activity  PATIENT GOALS: Get pain better, walk for exercise, go to gym  OBJECTIVE:   PATIENT SURVEYS:  05/28/2023: FOTO update:  67  04/28/2023 FOTO eval: 56   predicted:  65  SCREENING FOR RED FLAGS: 04/28/2023 Bowel or bladder incontinence: No Cauda equina syndrome: No  COGNITION: 04/28/2023 Overall cognitive status: WFL normal      SENSATION: 04/28/2023 Not tested  MUSCLE LENGTH: 04/28/2023 Not performed today.   POSTURE:  04/28/2023 Reduced lumbar lordosis in standing.   PALPATION: 04/28/2023 Tenderness noted in distal Lt quad, proximal incision. Glute max/med and side of thigh also tender on Lt  side  LUMBAR ROM:  04/28/2023 Directional Preference Assessment: Centralization: Peripheralization:   AROM 04/28/2023 05/19/2023  Flexion To ankles no complaints    Extension 75% WFL c Rt lumbar pain noted (different than chief complaint)  100 % WFL  Right lateral flexion    Left lateral flexion    Right rotation To knee joint   Left rotation To lateral femoral epicondyle     (Blank rows = not tested)  LOWER EXTREMITY ROM:      Right 04/28/2023 Left 04/28/2023 Right 05/19/2023 Left 05/19/2023 Right 05/28/2023 Left 05/28/2023  Hip flexion        Hip extension        Hip abduction        Hip adduction        Hip internal rotation        Hip external rotation        Knee flexion  102 AROM in heel slide 100 AROM heel slide 105 AROM heel slide 107 AROM heel slide 109 AROM heel slide  Knee extension  0 AROM seated heel slide      Ankle dorsiflexion        Ankle plantarflexion        Ankle inversion        Ankle eversion         (Blank rows = not tested)  LOWER EXTREMITY MMT:    MMT Right 04/28/2023 Left 04/28/2023 Right 06/02/2023 Left 06/02/2023  Hip flexion 5/5 5/5    Hip extension      Hip abduction      Hip adduction      Hip internal rotation      Hip external rotation      Knee flexion 5/5 5/5    Knee extension 5/5 38.9, 41 lbs 4+/5 28, 24.3 lbs 5/5 42, 41.4 lbs 5/5 35, 31.3 lbs  Ankle dorsiflexion 5/5 5/5    Ankle plantarflexion      Ankle inversion      Ankle eversion       (Blank rows = not tested)   SPECIAL TESTS:  04/28/2023  No specific testing  FUNCTIONAL TESTS:  04/28/2023 18 inch chair transfer s UE assist but pain in knees noted.  Lt SLS 8 seconds Rt SLS 10 seconds  GAIT: 04/28/2023 Reduced TKE in stance on Lt leg with trendelenburg gait on Lt.                                                                                                                                                                                                                    TODAY'S TREATMENT:                                                                                                         DATE:  06/09/2023 Therex: UBE UE/LE seat 8 for ROM/mobility 9 mins  lvl 3.0  Machine weight leg extension double leg up, single leg lowering 5 lbs 2 x 10 Lt, 10 lbs 2 x 10 Rt  Machine weight leg curl double leg pull, single leg return eccentrics 15 lbs x 15 bilaterally  Seated tband blue clam shells x 20 (to mimic machine weight) Seated ball hip adductor squeeze 5 sec hold x 10    Manual Percussive device Lt lateral hip/thigh, Lt posterior hip for soft tissue mobilization/ trigger point release.     TODAY'S TREATMENT:                                                                                                         DATE:  06/04/2023 Therex: UBE UE/LE seat 8 for ROM/mobility 9 lvl 3.0 to 4.0 Supine Lt hamstring isometric hip extension into table 10 sec hold x 10  Supine SKC 15 sec  x 5 Lt leg  Review of advice on routine of HEP (frequency, timing)   Neuro Re-ed SLS with reactive blaze pod light tapping contralateral leg in semicircle anteriorly 3 lights 30 sec x 3 bialteral with rest breaks SLS with contralateral leg step over and back 6 inch hurdle 2 x 10 bilaterally   Manual Percussive device Lt lateral hip/thigh, Lt posterior hip for soft tissue mobilization/ trigger point release.   TherActivity (to improve transfers, ambulation, stairs and other daily mobility ) Leg press double leg 106 lbs x 20 ,  single leg 2 x 15 56 lbs performed bilaterally   TODAY'S TREATMENT:                                                                                                         DATE:  06/02/2023 Therex: UBE UE/LE seat 8 for ROM/mobility 9 lvl 2.5  Seated quad set with SLR 2x 15 bilateral   Reviewed handout with HEP updates and time spent in question/answer.   Manual Percussive device Lt lateral hip/thigh, Rt posterior hip for soft tissue mobilization/ trigger  point release.   TherActivity (to improve transfers, ambulation, stairs and other daily mobility ) Step up fwd bottom step of staircase (6-7 inch height) x 10 bilateral, lateral step down x 10 bilateral. Sit to stand to sit 18x without UE assist  Additional time for education and home use of step up for activity with stairs/transfers.   TODAY'S TREATMENT:                                                                                                         DATE:  05/28/2023 Therex: UBE UE/LE seat 8 for ROM/mobility 10 mins lvl 3 Supine hooklying bridge  2 x 10  Supine lumbar trunk rotation 15 sec x 3 bilateral   Manual Percussive device Lt lateral hip/thigh, Rt posterior hip for soft tissue mobilization/ trigger point release.   Seated knee flexion c IR/distraction bilateral  TherActivity (to improve transfers, ambulation, stairs and other daily mobility ) Leg press double leg 100 lbs x 20, single leg 2 x 15  50 lbs - performed bilaterally   TODAY'S TREATMENT:  DATE:  05/26/2023 Therex: UBE UE/LE seat 8 for ROM/mobility 10 mins lvl 3 Supine hooklying bridge c blue  band hip abduction hold x 20  Single knee to opposite shoulder supine stretch 15 sec x 3 bilateral  Seated quad set with SLR x 15 bilateral with slow movement focus. 1 lb   Manual Percussive device Lt lateral hip/thigh for soft tissue mobilization/ trigger point release.  Education and use of tennis ball pressure for at home use.   TherActivity (to improve transfers, ambulation, stairs and other daily mobility ) Leg press double leg 100 lbs x 20, single leg 2 x 15  50 lbs - performed bilaterally    PATIENT EDUCATION:  06/02/2023 Education details: HEP, POC Person educated: Patient Education method: Programmer, multimedia, Facilities manager, Verbal cues, and Handouts Education comprehension: verbalized understanding, returned  demonstration, and verbal cues required  HOME EXERCISE PROGRAM: Access Code: [email or web address] URL: [email or web address] Date: 06/02/2023 Prepared by: Chyrel Masson  Exercises - Supine Lower Trunk Rotation  - 1-2 x daily - 7 x weekly - 1 sets - 3-5 reps - 15 hold - Supine Bridge  - 1-2 x daily - 7 x weekly - 1-2 sets - 10 reps - 2 hold - Supine Single Knee to Chest Stretch  - 1-2 x daily - 7 x weekly - 1 sets - 5 reps - 15 hold - Seated Quad Set  - 1-2 x daily - 7 x weekly - 1 sets - 10 reps - 5 hold - Seated Straight Leg Heel Taps  - 1-2 x daily - 7 x weekly - 3 sets - 10 reps - Sit to Stand  - 3 x daily - 7 x weekly - 1 sets - 10 reps - Standing Hip Hiking  - 1-2 x daily - 7 x weekly - 1 sets - 10 reps - 5 hold - Step Up  - 1 x daily - 4-5 x weekly - 2-3 sets - 10-15 reps - Lateral Step Up  - 1 x daily - 4-5 x weekly - 2-3 sets - 10-15 reps  ASSESSMENT:  CLINICAL IMPRESSION: Reviewed machine knee extension and flexion eccentric for strengthening program at gym.  Discussed and recommended percussive device for home based off Pt desires.  Continued skilled PT services indicated at this time to continue progression in LE strength and functional mobility improvements.   OBJECTIVE IMPAIRMENTS: Abnormal gait, decreased activity tolerance, decreased balance, decreased coordination, decreased endurance, decreased mobility, difficulty walking, decreased ROM, decreased strength, hypomobility, increased fascial restrictions, impaired perceived functional ability, impaired flexibility, improper body mechanics, postural dysfunction, and pain.   ACTIVITY LIMITATIONS: carrying, lifting, bending, sitting, standing, squatting, sleeping, stairs, transfers, bed mobility, locomotion level, and caring for others  PARTICIPATION LIMITATIONS: meal prep, cleaning, laundry, interpersonal relationship, shopping, community activity, and occupation  PERSONAL FACTORS: Time since onset of  injury/illness/exacerbation are also affecting patient's functional outcome.   REHAB POTENTIAL: Good  CLINICAL DECISION MAKING: Stable/uncomplicated  EVALUATION COMPLEXITY: Low   GOALS: Goals reviewed with patient? Yes  SHORT TERM GOALS: (target date for Short term goals are 3 weeks 05/19/2023)  1. Patient will demonstrate independent use of home exercise program to maintain progress from in clinic treatments.  Goal status: met 05/19/2023  LONG TERM GOALS: (target dates for all long term goals are 10 weeks  07/07/2023 )   1. Patient will demonstrate/report pain at worst less than or equal to 2/10 to facilitate minimal limitation in daily activity secondary to pain symptoms.  Goal status: on going 05/26/2023  2. Patient will demonstrate independent use of home exercise program to facilitate ability to maintain/progress functional gains from skilled physical therapy services.  Goal status: on going 05/26/2023   3. Patient will demonstrate FOTO outcome > or = 65 % to indicate reduced disability due to condition.  Goal status: on going 05/26/2023   4. Patient will demonstrate lumbar extension 100 % WFL s symptoms to facilitate upright standing, walking posture at PLOF s limitation.  Goal status: on going 05/26/2023   5.  Patient will demonstrate bilateral knee extension MMT 5/5, dynamometry testing > 45 lbs to facilitate stairs, ambulation and other daily activity at PLOF.   Goal status: on going 05/26/2023   6.  Patient will demonstrate bilateral SLS > 15 seconds to improve stability in ambulation.  Goal status: on going 05/26/2023   7.  Patient will demonstrate ascending/descending stairs reciprocally s UE assist for community integration.   Goal Status: on going 05/26/2023  PLAN:  PT FREQUENCY: 1-2x/week  PT DURATION: 10 weeks  PLANNED INTERVENTIONS: Can include 40981- PT Re-evaluation, 97110-Therapeutic exercises, 97530- Therapeutic activity, 97112- Neuromuscular  re-education, 97535- Self Care, 97140- Manual therapy, [phone]- Gait training, [phone]- Orthotic Fit/training, [phone]- Canalith repositioning, [id number]- Aquatic Therapy, [phone]- Electrical stimulation (unattended), 97750 Physical performance testing, [id number]- Electrical stimulation (manual), 97016- Vasopneumatic device, [id number]- Ultrasound, [id number]- Traction (mechanical), [id number]- Ionotophoresis 4mg /ml Dexamethasone, Patient/Family education, Balance training, Stair training, Taping, Dry Needling, Joint mobilization, Joint manipulation, Spinal manipulation, Spinal mobilization, Scar mobilization, Vestibular training, Visual/preceptual remediation/compensation, DME instructions, Cryotherapy, and Moist heat.  All performed as medically necessary.  All included unless contraindicated  PLAN FOR NEXT SESSION: Follow up on gym use of machine weights.   Chyrel Masson, PT, DPT, OCS, ATC 06/09/23  2:43 PM

## 2023-06-10 ENCOUNTER — Encounter: Payer: Self-pay | Admitting: Orthopaedic Surgery

## 2023-06-11 ENCOUNTER — Encounter: Payer: Self-pay | Admitting: Rehabilitative and Restorative Service Providers"

## 2023-06-11 ENCOUNTER — Ambulatory Visit (INDEPENDENT_AMBULATORY_CARE_PROVIDER_SITE_OTHER): Payer: Medicare Other | Admitting: Rehabilitative and Restorative Service Providers"

## 2023-06-11 DIAGNOSIS — M79605 Pain in left leg: Secondary | ICD-10-CM

## 2023-06-11 DIAGNOSIS — M5459 Other low back pain: Secondary | ICD-10-CM | POA: Diagnosis not present

## 2023-06-11 DIAGNOSIS — M6281 Muscle weakness (generalized): Secondary | ICD-10-CM | POA: Diagnosis not present

## 2023-06-11 DIAGNOSIS — R262 Difficulty in walking, not elsewhere classified: Secondary | ICD-10-CM

## 2023-06-11 NOTE — Therapy (Signed)
 OUTPATIENT PHYSICAL THERAPY TREATMENT   Patient Name: Krystal Lang MRN: [id number] DOB:[birth year], 68 y.o., female Today's Date: 06/11/2023  END OF SESSION:  PT End of Session - 06/11/23 1352     Visit Number 9    Number of Visits 20    Date for PT Re-Evaluation 07/07/23    Authorization Type Medicare/ AETNA    Progress Note Due on Visit 10    PT Start Time 1343    PT Stop Time 1423    PT Time Calculation (min) 40 min    Activity Tolerance Patient tolerated treatment well    Behavior During Therapy WFL for tasks assessed/performed                     Past Medical History:  Diagnosis Date   Anxiety    Arthritis    Cataract    Mixed form OU   Complication of anesthesia    after knee arthroscopy pateint difficult to wake up   Diabetes mellitus    Fibroid uterus 2001   H/O fatigue 1998   Headache(784.0)    denies   Hepatitis    A, 30 years ago   Hirsutism 1998   Idiopathic   History of chicken pox    Hypothyroidism    Menses, irregular 1998   Past Surgical History:  Procedure Laterality Date   COLONOSCOPY  02/2021   DILATION AND CURETTAGE OF UTERUS  03/31/2000   In [place]   KNEE ARTHROPLASTY Right    Replaced in [place], [place]   KNEE ARTHROSCOPY Right 2008   TONSILLECTOMY     As a child   TOTAL KNEE ARTHROPLASTY Left 08/21/2022   Procedure: LEFT TOTAL KNEE ARTHROPLASTY;  Surgeon: Kathryne Hitch, MD;  Location: [facility];  Service: Orthopedics;  Laterality: Left;   Patient Active Problem List   Diagnosis Date Noted   Status post total left knee replacement 08/21/2022   Dyspareunia, female 11/13/2011   Type 2 diabetes mellitus (HCC) 11/13/2011   Alopecia 11/13/2011   History of chicken pox    Headache    Hypothyroidism    Menses, irregular    Hirsutism    H/O fatigue    Fibroid uterus     PCP: Orpah Cobb MD  REFERRING PROVIDER: London Sheer, MD  REFERRING DIAG: M54.50 (ICD-10-CM) - Low back pain, unspecified back pain  laterality, unspecified chronicity, unspecified whether sciatica present  Rationale for Evaluation and Treatment: Rehabilitation  THERAPY DIAG:  Other low back pain  Pain in left leg  Muscle weakness (generalized)  Difficulty in walking, not elsewhere classified  ONSET DATE: 07/2022  SUBJECTIVE:  SUBJECTIVE STATEMENT: Pt indicated back pain has been feeling better.  Pt indicated stairs slowly improving.   PERTINENT HISTORY:  PMH significant for anxiety, OA, DM, hypothyroidism, and Lt TKA 08/21/22.  Rt TKA  PAIN:  NPRS scale:  no specific pain at rest.  Pain location:back, Pain description: constant, achy Aggravating factors: standing, walking, stairs, sitting prolonged at times for back.  WB pressure Relieving factors: nothing specific   PRECAUTIONS: None  WEIGHT BEARING RESTRICTIONS: No  FALLS:  Has patient fallen in last 6 months? 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   PLOF: Independent, gym based activity  PATIENT GOALS: Get pain better, walk for exercise, go to gym  OBJECTIVE:   PATIENT SURVEYS:  05/28/2023: FOTO update:  67  04/28/2023 FOTO eval: 56   predicted:  65  SCREENING FOR RED FLAGS: 04/28/2023 Bowel or bladder incontinence: No Cauda equina syndrome: No  COGNITION: 04/28/2023 Overall cognitive status: WFL normal      SENSATION: 04/28/2023 Not tested  MUSCLE LENGTH: 04/28/2023 Not performed today.   POSTURE:  04/28/2023 Reduced lumbar lordosis in standing.   PALPATION: 04/28/2023 Tenderness noted in distal Lt quad, proximal incision. Glute max/med and side of thigh also tender on Lt side  LUMBAR ROM:  04/28/2023 Directional  Preference Assessment: Centralization: Peripheralization:   AROM 04/28/2023 05/19/2023  Flexion To ankles no complaints    Extension 75% WFL c Rt lumbar pain noted (different than chief complaint)  100 % WFL  Right lateral flexion    Left lateral flexion    Right rotation To knee joint   Left rotation To lateral femoral epicondyle     (Blank rows = not tested)  LOWER EXTREMITY ROM:      Right 04/28/2023 Left 04/28/2023 Right 05/19/2023 Left 05/19/2023 Right 05/28/2023 Left 05/28/2023 Right 06/11/2023 Left 06/11/2023  Hip flexion          Hip extension          Hip abduction          Hip adduction          Hip internal rotation          Hip external rotation          Knee flexion  102 AROM in heel slide 100 AROM heel slide 105 AROM heel slide 107 AROM heel slide 109 AROM heel slide 107 AROM heel slide 108 AROM heel slide  Knee extension  0 AROM seated heel slide        Ankle dorsiflexion          Ankle plantarflexion          Ankle inversion          Ankle eversion           (Blank rows = not tested)  LOWER EXTREMITY MMT:    MMT Right 04/28/2023 Left 04/28/2023 Right 06/02/2023 Left 06/02/2023  Hip flexion 5/5 5/5    Hip extension      Hip abduction      Hip adduction      Hip internal rotation      Hip external rotation      Knee flexion 5/5 5/5    Knee extension 5/5 38.9, 41 lbs 4+/5 28, 24.3 lbs 5/5 42, 41.4 lbs 5/5 35, 31.3 lbs  Ankle dorsiflexion 5/5 5/5    Ankle plantarflexion      Ankle inversion  Ankle eversion       (Blank rows = not tested)   SPECIAL TESTS:  04/28/2023 No specific testing  FUNCTIONAL TESTS:  04/28/2023 18 inch chair transfer s UE assist but pain in knees noted.  Lt SLS 8 seconds Rt SLS 10 seconds  GAIT: 04/28/2023 Reduced TKE in stance on Lt leg with trendelenburg gait on Lt.                                                                                                                                                                                                                    TODAY'S TREATMENT:                                                                                                         DATE:  06/11/2023 Therex: UBE UE/LE seat 8 for ROM/mobility 10 mins  lvl 3.0  Machine weight leg extension double leg up, single leg lowering 10 lbs 2 x 10 performed bilaterally  Machine weight leg curl double leg pull, single leg return eccentrics 15 lbs 2 x10  bilaterally   Education and discussion about strength gains and how that would impact activities such as hiking, stairs and other higher level activity.     TherActivity (to improve stairs, transfers, and other daily activity ) Leg press double leg 106 lbs x 15, single leg 2 x 15 50 lbs bilaterally   Manual Seated flexion mobilization c movement IR/distraction.  Proximal medial posterior glides tibia  TODAY'S TREATMENT:  DATE:  06/09/2023 Therex: UBE UE/LE seat 8 for ROM/mobility 9 mins  lvl 3.0  Machine weight leg extension double leg up, single leg lowering 5 lbs 2 x 10 Lt, 10 lbs 2 x 10 Rt  Machine weight leg curl double leg pull, single leg return eccentrics 15 lbs x 15 bilaterally  Seated tband blue clam shells x 20 (to mimic machine weight) Seated ball hip adductor squeeze 5 sec hold x 10    Manual Percussive device Lt lateral hip/thigh, Lt posterior hip for soft tissue mobilization/ trigger point release.     TODAY'S TREATMENT:                                                                                                         DATE:  06/04/2023 Therex: UBE UE/LE seat 8 for ROM/mobility 9 lvl 3.0 to 4.0 Supine Lt hamstring isometric hip extension into table 10 sec hold x 10  Supine SKC 15 sec x 5 Lt leg  Review of advice on routine of HEP (frequency, timing)   Neuro Re-ed SLS with reactive blaze pod light tapping contralateral leg in  semicircle anteriorly 3 lights 30 sec x 3 bialteral with rest breaks SLS with contralateral leg step over and back 6 inch hurdle 2 x 10 bilaterally   Manual Percussive device Lt lateral hip/thigh, Lt posterior hip for soft tissue mobilization/ trigger point release.   TherActivity (to improve transfers, ambulation, stairs and other daily mobility ) Leg press double leg 106 lbs x 20 ,  single leg 2 x 15 56 lbs performed bilaterally   TODAY'S TREATMENT:                                                                                                         DATE:  06/02/2023 Therex: UBE UE/LE seat 8 for ROM/mobility 9 lvl 2.5  Seated quad set with SLR 2x 15 bilateral   Reviewed handout with HEP updates and time spent in question/answer.   Manual Percussive device Lt lateral hip/thigh, Rt posterior hip for soft tissue mobilization/ trigger point release.   TherActivity (to improve transfers, ambulation, stairs and other daily mobility ) Step up fwd bottom step of staircase (6-7 inch height) x 10 bilateral, lateral step down x 10 bilateral. Sit to stand to sit 18x without UE assist  Additional time for education and home use of step up for activity with stairs/transfers.   TODAY'S TREATMENT:  DATE:  05/28/2023 Therex: UBE UE/LE seat 8 for ROM/mobility 10 mins lvl 3 Supine hooklying bridge  2 x 10  Supine lumbar trunk rotation 15 sec x 3 bilateral   Manual Percussive device Lt lateral hip/thigh, Rt posterior hip for soft tissue mobilization/ trigger point release.   Seated knee flexion c IR/distraction bilateral  TherActivity (to improve transfers, ambulation, stairs and other daily mobility ) Leg press double leg 100 lbs x 20, single leg 2 x 15  50 lbs - performed bilaterally   TODAY'S TREATMENT:                                                                                                          DATE:  05/26/2023 Therex: UBE UE/LE seat 8 for ROM/mobility 10 mins lvl 3 Supine hooklying bridge c blue  band hip abduction hold x 20  Single knee to opposite shoulder supine stretch 15 sec x 3 bilateral  Seated quad set with SLR x 15 bilateral with slow movement focus. 1 lb   Manual Percussive device Lt lateral hip/thigh for soft tissue mobilization/ trigger point release.  Education and use of tennis ball pressure for at home use.   TherActivity (to improve transfers, ambulation, stairs and other daily mobility ) Leg press double leg 100 lbs x 20, single leg 2 x 15  50 lbs - performed bilaterally    PATIENT EDUCATION:  06/02/2023 Education details: HEP, POC Person educated: Patient Education method: Programmer, multimedia, Facilities manager, Verbal cues, and Handouts Education comprehension: verbalized understanding, returned demonstration, and verbal cues required  HOME EXERCISE PROGRAM: Access Code: [email or web address] URL: [email or web address] Date: 06/02/2023 Prepared by: Chyrel Masson  Exercises - Supine Lower Trunk Rotation  - 1-2 x daily - 7 x weekly - 1 sets - 3-5 reps - 15 hold - Supine Bridge  - 1-2 x daily - 7 x weekly - 1-2 sets - 10 reps - 2 hold - Supine Single Knee to Chest Stretch  - 1-2 x daily - 7 x weekly - 1 sets - 5 reps - 15 hold - Seated Quad Set  - 1-2 x daily - 7 x weekly - 1 sets - 10 reps - 5 hold - Seated Straight Leg Heel Taps  - 1-2 x daily - 7 x weekly - 3 sets - 10 reps - Sit to Stand  - 3 x daily - 7 x weekly - 1 sets - 10 reps - Standing Hip Hiking  - 1-2 x daily - 7 x weekly - 1 sets - 10 reps - 5 hold - Step Up  - 1 x daily - 4-5 x weekly - 2-3 sets - 10-15 reps - Lateral Step Up  - 1 x daily - 4-5 x weekly - 2-3 sets - 10-15 reps  ASSESSMENT:  CLINICAL IMPRESSION: Reduced lumbar symptoms led to holding manual on those areas today due to improvements.  Continued consistent review on exercise performance and use in gym and at home.   Continued skilled PT services can be helpful.   OBJECTIVE IMPAIRMENTS: Abnormal gait, decreased activity tolerance,  decreased balance, decreased coordination, decreased endurance, decreased mobility, difficulty walking, decreased ROM, decreased strength, hypomobility, increased fascial restrictions, impaired perceived functional ability, impaired flexibility, improper body mechanics, postural dysfunction, and pain.   ACTIVITY LIMITATIONS: carrying, lifting, bending, sitting, standing, squatting, sleeping, stairs, transfers, bed mobility, locomotion level, and caring for others  PARTICIPATION LIMITATIONS: meal prep, cleaning, laundry, interpersonal relationship, shopping, community activity, and occupation  PERSONAL FACTORS: Time since onset of injury/illness/exacerbation are also affecting patient's functional outcome.   REHAB POTENTIAL: Good  CLINICAL DECISION MAKING: Stable/uncomplicated  EVALUATION COMPLEXITY: Low   GOALS: Goals reviewed with patient? Yes  SHORT TERM GOALS: (target date for Short term goals are 3 weeks 05/19/2023)  1. Patient will demonstrate independent use of home exercise program to maintain progress from in clinic treatments.  Goal status: met 05/19/2023  LONG TERM GOALS: (target dates for all long term goals are 10 weeks  07/07/2023 )   1. Patient will demonstrate/report pain at worst less than or equal to 2/10 to facilitate minimal limitation in daily activity secondary to pain symptoms.  Goal status: on going 05/26/2023   2. Patient will demonstrate independent use of home exercise program to facilitate ability to maintain/progress functional gains from skilled physical therapy services.  Goal status: on going 05/26/2023   3. Patient will demonstrate FOTO outcome > or = 65 % to indicate reduced disability due to condition.  Goal status: on going 05/26/2023   4. Patient will demonstrate lumbar extension 100 % WFL s symptoms to facilitate upright standing,  walking posture at PLOF s limitation.  Goal status: on going 05/26/2023   5.  Patient will demonstrate bilateral knee extension MMT 5/5, dynamometry testing > 45 lbs to facilitate stairs, ambulation and other daily activity at PLOF.   Goal status: on going 05/26/2023   6.  Patient will demonstrate bilateral SLS > 15 seconds to improve stability in ambulation.  Goal status: on going 05/26/2023   7.  Patient will demonstrate ascending/descending stairs reciprocally s UE assist for community integration.   Goal Status: on going 05/26/2023  PLAN:  PT FREQUENCY: 1-2x/week  PT DURATION: 10 weeks  PLANNED INTERVENTIONS: Can include 16109- PT Re-evaluation, 97110-Therapeutic exercises, 97530- Therapeutic activity, 97112- Neuromuscular re-education, 97535- Self Care, 97140- Manual therapy, [phone]- Gait training, [phone]- Orthotic Fit/training, [phone]- Canalith repositioning, [id number]- Aquatic Therapy, [phone]- Electrical stimulation (unattended), 97750 Physical performance testing, [phone]- Electrical stimulation (manual), 97016- Vasopneumatic device, [id number]- Ultrasound, [id number]- Traction (mechanical), [id number]- Ionotophoresis 4mg /ml Dexamethasone, Patient/Family education, Balance training, Stair training, Taping, Dry Needling, Joint mobilization, Joint manipulation, Spinal manipulation, Spinal mobilization, Scar mobilization, Vestibular training, Visual/preceptual remediation/compensation, DME instructions, Cryotherapy, and Moist heat.  All performed as medically necessary.  All included unless contraindicated  PLAN FOR NEXT SESSION: Recheck lumbar mobility, possible FOTO update.   Chyrel Masson, PT, DPT, OCS, ATC 06/11/23  2:25 PM

## 2023-06-18 ENCOUNTER — Other Ambulatory Visit (INDEPENDENT_AMBULATORY_CARE_PROVIDER_SITE_OTHER): Payer: Self-pay

## 2023-06-18 ENCOUNTER — Encounter: Payer: Self-pay | Admitting: Orthopaedic Surgery

## 2023-06-18 ENCOUNTER — Ambulatory Visit: Payer: Medicare Other | Admitting: Orthopaedic Surgery

## 2023-06-18 DIAGNOSIS — Z96651 Presence of right artificial knee joint: Secondary | ICD-10-CM

## 2023-06-18 DIAGNOSIS — Z96652 Presence of left artificial knee joint: Secondary | ICD-10-CM

## 2023-06-18 NOTE — Progress Notes (Signed)
 The patient comes today over a year out from a right total knee arthroplasty that was done in [facility] and we replaced her left knee in May of last year.  She still has lateral pain of both knees and both knees hurt but she is making good progress overall.  She is walking without assistive device.  She is an active 68 year old female.  Her husband who I know well is a cardiologist and is with her today.  Examination of both knees today show they are nice and straight.  Neither knee has an effusion.  Both knees have full extension and can flex to about 100 degrees.  Both knees are ligamentously stable.  An AP and lateral both knees shows well-seated total knee replacements with no complicating features.  I had to give her a lot of reassurance that I see no evidence of infection.  Her incisions actually look good on both sides.  She feels some type of bumpy sensation along her right knee incision but I do not see anything that looks worrisome.  I gave her reassurance that the knees are doing well.  The next time we need to see her 6 months.  Will have a standing AP and lateral of her knees at that visit.

## 2023-06-23 ENCOUNTER — Ambulatory Visit (INDEPENDENT_AMBULATORY_CARE_PROVIDER_SITE_OTHER): Admitting: Physical Therapy

## 2023-06-23 ENCOUNTER — Encounter: Payer: Self-pay | Admitting: Physical Therapy

## 2023-06-23 DIAGNOSIS — M79605 Pain in left leg: Secondary | ICD-10-CM

## 2023-06-23 DIAGNOSIS — M6281 Muscle weakness (generalized): Secondary | ICD-10-CM

## 2023-06-23 DIAGNOSIS — M5459 Other low back pain: Secondary | ICD-10-CM | POA: Diagnosis not present

## 2023-06-23 DIAGNOSIS — R262 Difficulty in walking, not elsewhere classified: Secondary | ICD-10-CM | POA: Diagnosis not present

## 2023-06-23 NOTE — Therapy (Signed)
 OUTPATIENT PHYSICAL THERAPY TREATMENT/PROGRESS note  Progress Note reporting period date 04/28/23 to date  See below for objective and subjective measurements relating to patients progress with PT.   Patient Name: Krystal Lang MRN: [id number] DOB:1955-11-09, 68 y.o., female Today's Date: 06/23/2023  END OF SESSION:  PT End of Session - 06/23/23 1217     Visit Number 10    Number of Visits 20    Date for PT Re-Evaluation 07/07/23    Authorization Type Medicare/ [employer]ET[employer]    Progress Note Due on Visit 20    PT Start Time 1145    PT Stop Time 1230    PT Time Calculation (min) 45 min    Activity Tolerance Patient tolerated treatment well    Behavior During Therapy WFL for tasks assessed/performed                      Past Medical History:  Diagnosis Date   Anxiety    Arthritis    Cataract    Mixed form OU   Complication of anesthesia    after knee arthroscopy pateint difficult to wake up   Diabetes mellitus    Fibroid uterus 2001   H/O fatigue 1998   Headache(784.0)    denies   Hepatitis    A, 30 years ago   Hirsutism 1998   Idiopathic   History of chicken pox    Hypothyroidism    Menses, irregular 1998   Past Surgical History:  Procedure Laterality Date   COLONOSCOPY  02/2021   DILATION AND CURETTAGE OF UTERUS  03/31/2000   In [place]   KNEE ARTHROPLASTY Right    Replaced in [place], [place]   KNEE ARTHROSCOPY Right 2008   TONSILLECTOMY     As a child   TOTAL KNEE ARTHROPLASTY Left 08/21/2022   Procedure: LEFT TOTAL KNEE ARTHROPLASTY;  Surgeon: Kathryne Hitch, MD;  Location: [facility];  Service: Orthopedics;  Laterality: Left;   Patient Active Problem List   Diagnosis Date Noted   Status post total left knee replacement 08/21/2022   Dyspareunia, female 11/13/2011   Type 2 diabetes mellitus (HCC) 11/13/2011   Alopecia 11/13/2011   History of chicken pox    Headache    Hypothyroidism    Menses, irregular    Hirsutism    H/O fatigue    Fibroid  uterus     PCP: Orpah Cobb MD  REFERRING PROVIDER: London Sheer, MD  REFERRING DIAG: M54.50 (ICD-10-CM) - Low back pain, unspecified back pain laterality, unspecified chronicity, unspecified whether sciatica present  Rationale for Evaluation and Treatment: Rehabilitation  THERAPY DIAG:  Other low back pain  Pain in left leg  Muscle weakness (generalized)  Difficulty in walking, not elsewhere classified  ONSET DATE: 07/2022  SUBJECTIVE:  SUBJECTIVE STATEMENT: Pain is 4/10 overall in her knees. The pain comes and goes but she is limited with stairs, walking and prolonged housework. She gets burning in her right knee still. She gets pain in left lateral knee   PERTINENT HISTORY:  PMH significant for anxiety, OA, DM, hypothyroidism, and Lt TKA 08/21/22.  Rt TKA  PAIN:  NPRS scale:  see subjective statement Pain location:back, Pain description: constant, achy Aggravating factors: standing, walking, stairs, sitting prolonged at times for back.  WB pressure Relieving factors: nothing specific   PRECAUTIONS: None  WEIGHT BEARING RESTRICTIONS: No  FALLS:  Has patient fallen in last 6 months? 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   PLOF: Independent, gym based activity  PATIENT GOALS: Get pain better, walk for exercise, go to gym  OBJECTIVE:   PATIENT SURVEYS:  06/23/23 FOTO updated 56%  05/28/2023: FOTO update:  67  04/28/2023 FOTO eval: 56   predicted:  65  SCREENING FOR RED FLAGS: 04/28/2023 Bowel or bladder incontinence: No Cauda equina syndrome: No  COGNITION: 04/28/2023 Overall cognitive status: WFL normal      SENSATION: 04/28/2023 Not  tested  MUSCLE LENGTH: 04/28/2023 Not performed today.   POSTURE:  04/28/2023 Reduced lumbar lordosis in standing.   PALPATION: 04/28/2023 Tenderness noted in distal Lt quad, proximal incision. Glute max/med and side of thigh also tender on Lt side  LUMBAR ROM:  04/28/2023 Directional Preference Assessment: Centralization: Peripheralization:   AROM 04/28/2023 05/19/2023 06/23/23  Flexion To ankles no complaints   WNL  Extension 75% WFL c Rt lumbar pain noted (different than chief complaint)  100 % WFL WNL  Right lateral flexion     Left lateral flexion     Right rotation To knee joint  WNL  Left rotation To lateral femoral epicondyle   WNL   (Blank rows = not tested)  LOWER EXTREMITY ROM:      Right 04/28/2023 Left 04/28/2023 Right 05/19/2023 Left 05/19/2023 Right 05/28/2023 Left 05/28/2023 Right 06/11/2023 Left 06/11/2023 Rt/Lt  Hip flexion           Hip extension           Hip abduction           Hip adduction           Hip internal rotation           Hip external rotation           Knee flexion  102 AROM in heel slide 100 AROM heel slide 105 AROM heel slide 107 AROM heel slide 109 AROM heel slide 107 AROM heel slide 108 AROM heel slide 105/106 AAROM  Knee extension  0 AROM seated heel slide         Ankle dorsiflexion           Ankle plantarflexion           Ankle inversion           Ankle eversion            (Blank rows = not tested)  LOWER EXTREMITY MMT:    MMT Right 04/28/2023 Left 04/28/2023 Right 06/02/2023 Left 06/02/2023 Rt/Lt  Hip flexion 5/5 5/5     Hip extension       Hip abduction       Hip adduction       Hip internal rotation  Hip external rotation       Knee flexion 5/5 5/5     Knee extension 5/5 38.9, 41 lbs 4+/5 28, 24.3 lbs 5/5 42, 41.4 lbs 5/5 35, 31.3 lbs 48#/40#  Ankle dorsiflexion 5/5 5/5     Ankle plantarflexion       Ankle inversion       Ankle eversion        (Blank rows = not tested)   SPECIAL TESTS:  04/28/2023 No specific  testing  FUNCTIONAL TESTS:  04/28/2023 18 inch chair transfer s UE assist but pain in knees noted.  Lt SLS 8 seconds Rt SLS 10 seconds  GAIT: 04/28/2023 Reduced TKE in stance on Lt leg with trendelenburg gait on Lt.                                                                                                                                                                                                                   TODAY'S TREATMENT:                                                                                                         DATE:  06/23/2023 Therex: UBE UE/LE seat 8 for ROM/mobility 10 mins  lvl 3.0  FOTO update and measurments updated Supine heelslides 5 sec X 10 bilat Seated knee flexion stretch 5 sec X 10 AAROM.Marland Kitchen Supine quad stretch with strap 30 sec X 3 Supine ITB stretch on left 30 sec X3   TherActivity (strength to to improve stairs, transfers, and other daily activity ) Leg press double leg 118 lbs 2x 10, single leg  x 15 75 lbs bilaterally  Machine weight leg extension double leg up, single leg lowering 10 lbs 2 x 10 performed bilaterally  Machine weight leg curl double legs 35# 2X10.    TODAY'S TREATMENT:  DATE:  06/09/2023 Therex: UBE UE/LE seat 8 for ROM/mobility 9 mins  lvl 3.0  Machine weight leg extension double leg up, single leg lowering 5 lbs 2 x 10 Lt, 10 lbs 2 x 10 Rt  Machine weight leg curl double leg pull, single leg return eccentrics 15 lbs x 15 bilaterally  Seated tband blue clam shells x 20 (to mimic machine weight) Seated ball hip adductor squeeze 5 sec hold x 10    Manual Percussive device Lt lateral hip/thigh, Lt posterior hip for soft tissue mobilization/ trigger point release.     TODAY'S TREATMENT:                                                                                                         DATE:   06/04/2023 Therex: UBE UE/LE seat 8 for ROM/mobility 9 lvl 3.0 to 4.0 Supine Lt hamstring isometric hip extension into table 10 sec hold x 10  Supine SKC 15 sec x 5 Lt leg  Review of advice on routine of HEP (frequency, timing)   Neuro Re-ed SLS with reactive blaze pod light tapping contralateral leg in semicircle anteriorly 3 lights 30 sec x 3 bialteral with rest breaks SLS with contralateral leg step over and back 6 inch hurdle 2 x 10 bilaterally   Manual Percussive device Lt lateral hip/thigh, Lt posterior hip for soft tissue mobilization/ trigger point release.   TherActivity (to improve transfers, ambulation, stairs and other daily mobility ) Leg press double leg 106 lbs x 20 ,  single leg 2 x 15 56 lbs performed bilaterally   TODAY'S TREATMENT:                                                                                                         DATE:  06/02/2023 Therex: UBE UE/LE seat 8 for ROM/mobility 9 lvl 2.5  Seated quad set with SLR 2x 15 bilateral   Reviewed handout with HEP updates and time spent in question/answer.   Manual Percussive device Lt lateral hip/thigh, Rt posterior hip for soft tissue mobilization/ trigger point release.   TherActivity (to improve transfers, ambulation, stairs and other daily mobility ) Step up fwd bottom step of staircase (6-7 inch height) x 10 bilateral, lateral step down x 10 bilateral. Sit to stand to sit 18x without UE assist  Additional time for education and home use of step up for activity with stairs/transfers.   TODAY'S TREATMENT:  DATE:  05/28/2023 Therex: UBE UE/LE seat 8 for ROM/mobility 10 mins lvl 3 Supine hooklying bridge  2 x 10  Supine lumbar trunk rotation 15 sec x 3 bilateral   Manual Percussive device Lt lateral hip/thigh, Rt posterior hip for soft tissue mobilization/ trigger point release.   Seated knee flexion c  IR/distraction bilateral  TherActivity (to improve transfers, ambulation, stairs and other daily mobility ) Leg press double leg 100 lbs x 20, single leg 2 x 15  50 lbs - performed bilaterally   TODAY'S TREATMENT:                                                                                                         DATE:  05/26/2023 Therex: UBE UE/LE seat 8 for ROM/mobility 10 mins lvl 3 Supine hooklying bridge c blue  band hip abduction hold x 20  Single knee to opposite shoulder supine stretch 15 sec x 3 bilateral  Seated quad set with SLR x 15 bilateral with slow movement focus. 1 lb   Manual Percussive device Lt lateral hip/thigh for soft tissue mobilization/ trigger point release.  Education and use of tennis ball pressure for at home use.   TherActivity (to improve transfers, ambulation, stairs and other daily mobility ) Leg press double leg 100 lbs x 20, single leg 2 x 15  50 lbs - performed bilaterally    PATIENT EDUCATION:  06/02/2023 Education details: HEP, POC Person educated: Patient Education method: Programmer, multimedia, Facilities manager, Verbal cues, and Handouts Education comprehension: verbalized understanding, returned demonstration, and verbal cues required  HOME EXERCISE PROGRAM: Access Code: [email or web address] URL: [email or web address] Date: 06/02/2023 Prepared by: Chyrel Masson  Exercises - Supine Lower Trunk Rotation  - 1-2 x daily - 7 x weekly - 1 sets - 3-5 reps - 15 hold - Supine Bridge  - 1-2 x daily - 7 x weekly - 1-2 sets - 10 reps - 2 hold - Supine Single Knee to Chest Stretch  - 1-2 x daily - 7 x weekly - 1 sets - 5 reps - 15 hold - Seated Quad Set  - 1-2 x daily - 7 x weekly - 1 sets - 10 reps - 5 hold - Seated Straight Leg Heel Taps  - 1-2 x daily - 7 x weekly - 3 sets - 10 reps - Sit to Stand  - 3 x daily - 7 x weekly - 1 sets - 10 reps - Standing Hip Hiking  - 1-2 x daily - 7 x weekly - 1 sets - 10 reps - 5 hold - Step Up  - 1 x daily - 4-5 x weekly  - 2-3 sets - 10-15 reps - Lateral Step Up  - 1 x daily - 4-5 x weekly - 2-3 sets - 10-15 reps  ASSESSMENT:  CLINICAL IMPRESSION: She had MD follow up and his note seems reassuring in terms of nothing structurally wrong with her knees on xray and no signs of any infection.  10th visit progress note shows she has made some progress with her knee ROM and strength overall  with updated measurements however still lacks some knee flexion ROM as well as overall knee strength in left knee more than right so I do recommend she continue to work with PT to try to improve that as much as possible to improve function for stairs, sit to stands, and housework. Her Back ROM is now WNL.   OBJECTIVE IMPAIRMENTS: Abnormal gait, decreased activity tolerance, decreased balance, decreased coordination, decreased endurance, decreased mobility, difficulty walking, decreased ROM, decreased strength, hypomobility, increased fascial restrictions, impaired perceived functional ability, impaired flexibility, improper body mechanics, postural dysfunction, and pain.   ACTIVITY LIMITATIONS: carrying, lifting, bending, sitting, standing, squatting, sleeping, stairs, transfers, bed mobility, locomotion level, and caring for others  PARTICIPATION LIMITATIONS: meal prep, cleaning, laundry, interpersonal relationship, shopping, community activity, and occupation  PERSONAL FACTORS: Time since onset of injury/illness/exacerbation are also affecting patient's functional outcome.   REHAB POTENTIAL: Good  CLINICAL DECISION MAKING: Stable/uncomplicated  EVALUATION COMPLEXITY: Low   GOALS: Goals reviewed with patient? Yes  SHORT TERM GOALS: (target date for Short term goals are 3 weeks 05/19/2023)  1. Patient will demonstrate independent use of home exercise program to maintain progress from in clinic treatments.  Goal status: met 05/19/2023  LONG TERM GOALS: (target dates for all long term goals are 10 weeks  07/07/2023 )   1.  Patient will demonstrate/report pain at worst less than or equal to 2/10 to facilitate minimal limitation in daily activity secondary to pain symptoms.  Goal status: on going 06/23/2023   2. Patient will demonstrate independent use of home exercise program to facilitate ability to maintain/progress functional gains from skilled physical therapy services.  Goal status: on going 06/23/2023   3. Patient will demonstrate FOTO outcome > or = 65 % to indicate reduced disability due to condition.  Goal status: on going 06/23/2023   4. Patient will demonstrate lumbar extension 100 % WFL s symptoms to facilitate upright standing, walking posture at PLOF s limitation.  Goal status: MET 06/23/23   5.  Patient will demonstrate bilateral knee extension MMT 5/5, dynamometry testing > 45 lbs to facilitate stairs, ambulation and other daily activity at PLOF.   Goal status: on going 06/23/2023   6.  Patient will demonstrate bilateral SLS > 15 seconds to improve stability in ambulation.  Goal status: on going 06/23/2023   7.  Patient will demonstrate ascending/descending stairs reciprocally s UE assist for community integration.   Goal Status: on going 06/23/2023  PLAN:  PT FREQUENCY: 1-2x/week  PT DURATION: 10 weeks  PLANNED INTERVENTIONS: Can include 40981- PT Re-evaluation, 97110-Therapeutic exercises, 97530- Therapeutic activity, 97112- Neuromuscular re-education, 97535- Self Care, 97140- Manual therapy, [phone]- Gait training, [phone]- Orthotic Fit/training, [phone]- Canalith repositioning, [id number]- Aquatic Therapy, [phone]- Electrical stimulation (unattended), 97750 Physical performance testing, [id number]- Electrical stimulation (manual), 97016- Vasopneumatic device, [id number]- Ultrasound, [id number]- Traction (mechanical), [id number]- Ionotophoresis 4mg /ml Dexamethasone, Patient/Family education, Balance training, Stair training, Taping, Dry Needling, Joint mobilization, Joint manipulation, Spinal manipulation, Spinal  mobilization, Scar mobilization, Vestibular training, Visual/preceptual remediation/compensation, DME instructions, Cryotherapy, and Moist heat.  All performed as medically necessary.  All included unless contraindicated  PLAN FOR NEXT SESSION: continue with strength focus and improving knee flexion ROM as much as possible to work towards stairs and increased standing abilities.   Ivery Quale, PT, DPT 06/23/23 1:12 PM

## 2023-06-26 ENCOUNTER — Encounter: Payer: Self-pay | Admitting: Rehabilitative and Restorative Service Providers"

## 2023-06-26 ENCOUNTER — Ambulatory Visit: Admitting: Rehabilitative and Restorative Service Providers"

## 2023-06-26 DIAGNOSIS — M5459 Other low back pain: Secondary | ICD-10-CM

## 2023-06-26 DIAGNOSIS — R262 Difficulty in walking, not elsewhere classified: Secondary | ICD-10-CM

## 2023-06-26 DIAGNOSIS — M6281 Muscle weakness (generalized): Secondary | ICD-10-CM

## 2023-06-26 DIAGNOSIS — M79605 Pain in left leg: Secondary | ICD-10-CM

## 2023-06-26 NOTE — Therapy (Signed)
 OUTPATIENT PHYSICAL THERAPY TREATMENT   Patient Name: [name] MRN: [id number] DOB:Aug 19, 1955, 68 y.o., female Today's Date: 06/26/2023  END OF SESSION:  PT End of Session - 06/26/23 1106     Visit Number 11    Number of Visits 20    Date for PT Re-Evaluation 07/07/23    Authorization Type Medicare/ [employer]ET[employer]    Progress Note Due on Visit 20    PT Start Time 1058    PT Stop Time 1138    PT Time Calculation (min) 40 min    Activity Tolerance Patient tolerated treatment well    Behavior During Therapy WFL for tasks assessed/performed                       Past Medical History:  Diagnosis Date   Anxiety    Arthritis    Cataract    Mixed form OU   Complication of anesthesia    after knee arthroscopy pateint difficult to wake up   Diabetes mellitus    Fibroid uterus 2001   H/O fatigue 1998   Headache(784.0)    denies   Hepatitis    A, 30 years ago   Hirsutism 1998   Idiopathic   History of chicken pox    Hypothyroidism    Menses, irregular 1998   Past Surgical History:  Procedure Laterality Date   COLONOSCOPY  02/2021   DILATION AND CURETTAGE OF UTERUS  03/31/2000   In [place]   KNEE ARTHROPLASTY Right    Replaced in [place], [place]   KNEE ARTHROSCOPY Right 2008   TONSILLECTOMY     As a child   TOTAL KNEE ARTHROPLASTY Left 08/21/2022   Procedure: LEFT TOTAL KNEE ARTHROPLASTY;  Surgeon: Kathryne Hitch, MD;  Location: [facility];  Service: Orthopedics;  Laterality: Left;   Patient Active Problem List   Diagnosis Date Noted   Status post total left knee replacement 08/21/2022   Dyspareunia, female 11/13/2011   Type 2 diabetes mellitus (HCC) 11/13/2011   Alopecia 11/13/2011   History of chicken pox    Headache    Hypothyroidism    Menses, irregular    Hirsutism    H/O fatigue    Fibroid uterus     PCP: Orpah Cobb MD  REFERRING PROVIDER: London Sheer, MD  REFERRING DIAG: M54.50 (ICD-10-CM) - Low back pain, unspecified back pain  laterality, unspecified chronicity, unspecified whether sciatica present  Rationale for Evaluation and Treatment: Rehabilitation  THERAPY DIAG:  Other low back pain  Pain in left leg  Muscle weakness (generalized)  Difficulty in walking, not elsewhere classified  ONSET DATE: 07/2022  SUBJECTIVE:  SUBJECTIVE STATEMENT: Pt indicated having some pain in back and hip today upon arrival 3/10, reported a couple days of more noticed symptoms.  Reported variable symptoms in knee with stairs.   PERTINENT HISTORY:  PMH significant for anxiety, OA, DM, hypothyroidism, and Lt TKA 08/21/22.  Rt TKA  PAIN:  NPRS scale:  see subjective statement Pain location:back, knee at times Pain description: constant, achy Aggravating factors: standing, walking, stairs, sitting prolonged at times for back.  WB pressure Relieving factors: nothing specific   PRECAUTIONS: None  WEIGHT BEARING RESTRICTIONS: No  FALLS:  Has patient fallen in last 6 months? 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   PLOF: Independent, gym based activity  PATIENT GOALS: Get pain better, walk for exercise, go to gym  OBJECTIVE:   PATIENT SURVEYS:  06/23/23 FOTO updated 56%  05/28/2023: FOTO update:  67  04/28/2023 FOTO eval: 56   predicted:  65  SCREENING FOR RED FLAGS: 04/28/2023 Bowel or bladder incontinence: No Cauda equina syndrome: No  COGNITION: 04/28/2023 Overall cognitive status: WFL normal      SENSATION: 04/28/2023 Not tested  MUSCLE LENGTH: 04/28/2023 Not performed today.   POSTURE:  04/28/2023 Reduced lumbar lordosis in standing.   PALPATION: 04/28/2023 Tenderness noted in distal Lt  quad, proximal incision. Glute max/med and side of thigh also tender on Lt side  LUMBAR ROM:  04/28/2023 Directional Preference Assessment: Centralization: Peripheralization:   AROM 04/28/2023 05/19/2023 06/23/23  Flexion To ankles no complaints   WNL  Extension 75% WFL c Rt lumbar pain noted (different than chief complaint)  100 % WFL WNL  Right lateral flexion     Left lateral flexion     Right rotation To knee joint  WNL  Left rotation To lateral femoral epicondyle   WNL   (Blank rows = not tested)  LOWER EXTREMITY ROM:      Right 04/28/2023 Left 04/28/2023 Right 05/19/2023 Left 05/19/2023 Right 05/28/2023 Left 05/28/2023 Right 06/11/2023 Left 06/11/2023 Rt/Lt  Hip flexion           Hip extension           Hip abduction           Hip adduction           Hip internal rotation           Hip external rotation           Knee flexion  102 AROM in heel slide 100 AROM heel slide 105 AROM heel slide 107 AROM heel slide 109 AROM heel slide 107 AROM heel slide 108 AROM heel slide 105/106 AAROM  Knee extension  0 AROM seated heel slide         Ankle dorsiflexion           Ankle plantarflexion           Ankle inversion           Ankle eversion            (Blank rows = not tested)  LOWER EXTREMITY MMT:    MMT Right 04/28/2023 Left 04/28/2023 Right 06/02/2023 Left 06/02/2023 Rt 06/23/2023 Left 06/23/2023  Hip flexion 5/5 5/5      Hip extension        Hip abduction        Hip adduction        Hip internal rotation  Hip external rotation        Knee flexion 5/5 5/5      Knee extension 5/5 38.9, 41 lbs 4+/5 28, 24.3 lbs 5/5 42, 41.4 lbs 5/5 35, 31.3 lbs 48 lbs 40 lbs  Ankle dorsiflexion 5/5 5/5      Ankle plantarflexion        Ankle inversion        Ankle eversion         (Blank rows = not tested)   SPECIAL TESTS:  04/28/2023 No specific testing  FUNCTIONAL TESTS:  04/28/2023 18 inch chair transfer s UE assist but pain in knees noted.  Lt SLS 8 seconds Rt SLS 10  seconds  GAIT: 04/28/2023 Reduced TKE in stance on Lt leg with trendelenburg gait on Lt.                                                                                                                                                                                                                   TODAY'S TREATMENT:                                                                                                         DATE:  06/26/2023 Therex: UBE UE/LE seat 7 for ROM/mobility 10 mins  lvl 3.0  Sidelying hip abduction 2 x 10 bilateral  cues for positioning.    TherActivity (strength to to improve stairs, transfers, and other daily activity ) Leg press double leg 118 lbs x20,  single leg  x 20 62 lbs bilaterally  Machine weight leg extension double leg up, single leg lowering 10 lbs 2 x 15 performed bilaterally - improving quad control    Manual Percussive device Lt lateral hip/thigh, Lt posterior hip for soft tissue mobilization/ trigger point release.  Continued education on use for home as she has gotten a device on her own.   TODAY'S TREATMENT:  DATE:  06/23/2023 Therex: UBE UE/LE seat 8 for ROM/mobility 10 mins  lvl 3.0  FOTO update and measurments updated Supine heelslides 5 sec X 10 bilat Seated knee flexion stretch 5 sec X 10 AAROM.Marland Kitchen Supine quad stretch with strap 30 sec X 3 Supine ITB stretch on left 30 sec X3   TherActivity (strength to to improve stairs, transfers, and other daily activity ) Leg press double leg 118 lbs 2x 10, single leg  x 15 75 lbs bilaterally  Machine weight leg extension double leg up, single leg lowering 10 lbs 2 x 10 performed bilaterally  Machine weight leg curl double legs 35# 2X10.    TODAY'S TREATMENT:                                                                                                         DATE:  06/09/2023 Therex: UBE UE/LE seat  8 for ROM/mobility 9 mins  lvl 3.0  Machine weight leg extension double leg up, single leg lowering 5 lbs 2 x 10 Lt, 10 lbs 2 x 10 Rt  Machine weight leg curl double leg pull, single leg return eccentrics 15 lbs x 15 bilaterally  Seated tband blue clam shells x 20 (to mimic machine weight) Seated ball hip adductor squeeze 5 sec hold x 10    Manual Percussive device Lt lateral hip/thigh, Lt posterior hip for soft tissue mobilization/ trigger point release.     TODAY'S TREATMENT:                                                                                                         DATE:  06/04/2023 Therex: UBE UE/LE seat 8 for ROM/mobility 9 lvl 3.0 to 4.0 Supine Lt hamstring isometric hip extension into table 10 sec hold x 10  Supine SKC 15 sec x 5 Lt leg  Review of advice on routine of HEP (frequency, timing)   Neuro Re-ed SLS with reactive blaze pod light tapping contralateral leg in semicircle anteriorly 3 lights 30 sec x 3 bialteral with rest breaks SLS with contralateral leg step over and back 6 inch hurdle 2 x 10 bilaterally   Manual Percussive device Lt lateral hip/thigh, Lt posterior hip for soft tissue mobilization/ trigger point release.   TherActivity (to improve transfers, ambulation, stairs and other daily mobility ) Leg press double leg 106 lbs x 20 ,  single leg 2 x 15 56 lbs performed bilaterally     PATIENT EDUCATION:  06/02/2023 Education details: HEP, POC Person educated: Patient Education method: Programmer, multimedia, Demonstration, Verbal cues, and Handouts Education comprehension: verbalized understanding, returned demonstration, and verbal cues required  HOME  EXERCISE PROGRAM: Access Code: [email or web address] URL: [email or web address] Date: 06/02/2023 Prepared by: Chyrel Masson  Exercises - Supine Lower Trunk Rotation  - 1-2 x daily - 7 x weekly - 1 sets - 3-5 reps - 15 hold - Supine Bridge  - 1-2 x daily - 7 x weekly - 1-2 sets - 10 reps - 2 hold -  Supine Single Knee to Chest Stretch  - 1-2 x daily - 7 x weekly - 1 sets - 5 reps - 15 hold - Seated Quad Set  - 1-2 x daily - 7 x weekly - 1 sets - 10 reps - 5 hold - Seated Straight Leg Heel Taps  - 1-2 x daily - 7 x weekly - 3 sets - 10 reps - Sit to Stand  - 3 x daily - 7 x weekly - 1 sets - 10 reps - Standing Hip Hiking  - 1-2 x daily - 7 x weekly - 1 sets - 10 reps - 5 hold - Step Up  - 1 x daily - 4-5 x weekly - 2-3 sets - 10-15 reps - Lateral Step Up  - 1 x daily - 4-5 x weekly - 2-3 sets - 10-15 reps  ASSESSMENT:  CLINICAL IMPRESSION: Continued education and discussion about percussive device use at home (locations, duration , etc ).  Also continued emphasis on importance of progressive strengthening gains for functional movement capacity.  Gym activity has been reported increased which will be beneficial.    OBJECTIVE IMPAIRMENTS: Abnormal gait, decreased activity tolerance, decreased balance, decreased coordination, decreased endurance, decreased mobility, difficulty walking, decreased ROM, decreased strength, hypomobility, increased fascial restrictions, impaired perceived functional ability, impaired flexibility, improper body mechanics, postural dysfunction, and pain.   ACTIVITY LIMITATIONS: carrying, lifting, bending, sitting, standing, squatting, sleeping, stairs, transfers, bed mobility, locomotion level, and caring for others  PARTICIPATION LIMITATIONS: meal prep, cleaning, laundry, interpersonal relationship, shopping, community activity, and occupation  PERSONAL FACTORS: Time since onset of injury/illness/exacerbation are also affecting patient's functional outcome.   REHAB POTENTIAL: Good  CLINICAL DECISION MAKING: Stable/uncomplicated  EVALUATION COMPLEXITY: Low   GOALS: Goals reviewed with patient? Yes  SHORT TERM GOALS: (target date for Short term goals are 3 weeks 05/19/2023)  1. Patient will demonstrate independent use of home exercise program to maintain  progress from in clinic treatments.  Goal status: met 05/19/2023  LONG TERM GOALS: (target dates for all long term goals are 10 weeks  07/07/2023 )   1. Patient will demonstrate/report pain at worst less than or equal to 2/10 to facilitate minimal limitation in daily activity secondary to pain symptoms.  Goal status: on going 06/23/2023   2. Patient will demonstrate independent use of home exercise program to facilitate ability to maintain/progress functional gains from skilled physical therapy services.  Goal status: on going 06/23/2023   3. Patient will demonstrate FOTO outcome > or = 65 % to indicate reduced disability due to condition.  Goal status: on going 06/23/2023   4. Patient will demonstrate lumbar extension 100 % WFL s symptoms to facilitate upright standing, walking posture at PLOF s limitation.  Goal status: MET 06/23/23   5.  Patient will demonstrate bilateral knee extension MMT 5/5, dynamometry testing > 45 lbs to facilitate stairs, ambulation and other daily activity at PLOF.   Goal status: on going 06/23/2023   6.  Patient will demonstrate bilateral SLS > 15 seconds to improve stability in ambulation.  Goal status: on going 06/23/2023   7.  Patient will demonstrate ascending/descending stairs  reciprocally s UE assist for community integration.   Goal Status: on going 06/23/2023  PLAN:  PT FREQUENCY: 1-2x/week  PT DURATION: 10 weeks  PLANNED INTERVENTIONS: Can include [id number]- PT Re-evaluation, 97110-Therapeutic exercises, 97530- Therapeutic activity, 97112- Neuromuscular re-education, 97535- Self Care, 97140- Manual therapy, [phone]- Gait training, [phone]- Orthotic Fit/training, [phone]- Canalith repositioning, [id number]- Aquatic Therapy, [phone]- Electrical stimulation (unattended), 97750 Physical performance testing, [id number]- Electrical stimulation (manual), 97016- Vasopneumatic device, [id number]- Ultrasound, [id number]- Traction (mechanical), [id number]- Ionotophoresis 4mg /ml Dexamethasone,  Patient/Family education, Balance training, Stair training, Taping, Dry Needling, Joint mobilization, Joint manipulation, Spinal manipulation, Spinal mobilization, Scar mobilization, Vestibular training, Visual/preceptual remediation/compensation, DME instructions, Cryotherapy, and Moist heat.  All performed as medically necessary.  All included unless contraindicated  PLAN FOR NEXT SESSION: Strengthening, manual as necessary  Chyrel Masson, PT, DPT, OCS, ATC 06/26/23  11:35 AM

## 2023-06-30 ENCOUNTER — Encounter: Admitting: Rehabilitative and Restorative Service Providers"

## 2023-07-02 ENCOUNTER — Encounter: Payer: Self-pay | Admitting: Rehabilitative and Restorative Service Providers"

## 2023-07-02 ENCOUNTER — Ambulatory Visit (INDEPENDENT_AMBULATORY_CARE_PROVIDER_SITE_OTHER): Admitting: Rehabilitative and Restorative Service Providers"

## 2023-07-02 DIAGNOSIS — M5459 Other low back pain: Secondary | ICD-10-CM

## 2023-07-02 DIAGNOSIS — M6281 Muscle weakness (generalized): Secondary | ICD-10-CM | POA: Diagnosis not present

## 2023-07-02 DIAGNOSIS — R262 Difficulty in walking, not elsewhere classified: Secondary | ICD-10-CM

## 2023-07-02 DIAGNOSIS — M79605 Pain in left leg: Secondary | ICD-10-CM

## 2023-07-02 NOTE — Therapy (Signed)
 OUTPATIENT PHYSICAL THERAPY TREATMENT   Patient Name: Krystal Lang MRN: [id number] DOB:September 17, 1955, 68 y.o., female Today's Date: 07/02/2023  END OF SESSION:  PT End of Session - 07/02/23 1148     Visit Number 12    Number of Visits 20    Date for PT Re-Evaluation 07/07/23    Authorization Type Medicare/ [employer]ET[employer]    Progress Note Due on Visit 20    PT Start Time 1143    PT Stop Time 1223    PT Time Calculation (min) 40 min    Activity Tolerance Patient tolerated treatment well    Behavior During Therapy WFL for tasks assessed/performed                        Past Medical History:  Diagnosis Date   Anxiety    Arthritis    Cataract    Mixed form OU   Complication of anesthesia    after knee arthroscopy pateint difficult to wake up   Diabetes mellitus    Fibroid uterus 2001   H/O fatigue 1998   Headache(784.0)    denies   Hepatitis    A, 30 years ago   Hirsutism 1998   Idiopathic   History of chicken pox    Hypothyroidism    Menses, irregular 1998   Past Surgical History:  Procedure Laterality Date   COLONOSCOPY  02/2021   DILATION AND CURETTAGE OF UTERUS  03/31/2000   In [place]   KNEE ARTHROPLASTY Right    Replaced in [place], [place]   KNEE ARTHROSCOPY Right 2008   TONSILLECTOMY     As a child   TOTAL KNEE ARTHROPLASTY Left 08/21/2022   Procedure: LEFT TOTAL KNEE ARTHROPLASTY;  Surgeon: Kathryne Hitch, MD;  Location: [facility];  Service: Orthopedics;  Laterality: Left;   Patient Active Problem List   Diagnosis Date Noted   Status post total left knee replacement 08/21/2022   Dyspareunia, female 11/13/2011   Type 2 diabetes mellitus (HCC) 11/13/2011   Alopecia 11/13/2011   History of chicken pox    Headache    Hypothyroidism    Menses, irregular    Hirsutism    H/O fatigue    Fibroid uterus     PCP: Orpah Cobb MD  REFERRING PROVIDER: London Sheer, MD  REFERRING DIAG: M54.50 (ICD-10-CM) - Low back pain, unspecified back pain  laterality, unspecified chronicity, unspecified whether sciatica present  Rationale for Evaluation and Treatment: Rehabilitation  THERAPY DIAG:  Other low back pain  Pain in left leg  Muscle weakness (generalized)  Difficulty in walking, not elsewhere classified  ONSET DATE: 07/2022  SUBJECTIVE:  SUBJECTIVE STATEMENT: Pt indicated having more noted back pain upon arrival today, reported up to 5/10.  Pt indicated having done the exercises for knee.  She indicated feeling   PERTINENT HISTORY:  PMH significant for anxiety, OA, DM, hypothyroidism, and Lt TKA 08/21/22.  Rt TKA  PAIN:  NPRS scale: back pain 5/10 upon arrival today Pain location:back, knee at times Pain description: constant, achy Aggravating factors: standing, walking, stairs, sitting prolonged at times for back.  WB pressure Relieving factors: nothing specific   PRECAUTIONS: None  WEIGHT BEARING RESTRICTIONS: No  FALLS:  Has patient fallen in last 6 months? 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   PLOF: Independent, gym based activity  PATIENT GOALS: Get pain better, walk for exercise, go to gym  OBJECTIVE:   PATIENT SURVEYS:  06/23/23 FOTO updated 56%  05/28/2023: FOTO update:  67  04/28/2023 FOTO eval: 56   predicted:  65  SCREENING FOR RED FLAGS: 04/28/2023 Bowel or bladder incontinence: No Cauda equina syndrome: No  COGNITION: 04/28/2023 Overall cognitive status: WFL normal      SENSATION: 04/28/2023 Not tested  MUSCLE LENGTH: 04/28/2023 Not performed today.   POSTURE:  04/28/2023 Reduced lumbar lordosis in standing.   PALPATION: 04/28/2023 Tenderness noted in distal Lt quad,  proximal incision. Glute max/med and side of thigh also tender on Lt side  LUMBAR ROM:  04/28/2023 Directional Preference Assessment: Centralization: Peripheralization:   AROM 04/28/2023 05/19/2023 06/23/23 07/02/2023  Flexion To ankles no complaints   WNL   Extension 75% WFL c Rt lumbar pain noted (different than chief complaint)  100 % WFL WNL 100 % WFL  Right lateral flexion      Left lateral flexion      Right rotation To knee joint  WNL   Left rotation To lateral femoral epicondyle   WNL    (Blank rows = not tested)  LOWER EXTREMITY ROM:      Right 04/28/2023 Left 04/28/2023 Right 05/19/2023 Left 05/19/2023 Right 05/28/2023 Left 05/28/2023 Right 06/11/2023 Left 06/11/2023 Rt/Lt  Hip flexion           Hip extension           Hip abduction           Hip adduction           Hip internal rotation           Hip external rotation           Knee flexion  102 AROM in heel slide 100 AROM heel slide 105 AROM heel slide 107 AROM heel slide 109 AROM heel slide 107 AROM heel slide 108 AROM heel slide 105/106 AAROM  Knee extension  0 AROM seated heel slide         Ankle dorsiflexion           Ankle plantarflexion           Ankle inversion           Ankle eversion            (Blank rows = not tested)  LOWER EXTREMITY MMT:    MMT Right 04/28/2023 Left 04/28/2023 Right 06/02/2023 Left 06/02/2023 Rt 06/23/2023 Left 06/23/2023  Hip flexion 5/5 5/5      Hip extension        Hip abduction        Hip adduction  Hip internal rotation        Hip external rotation        Knee flexion 5/5 5/5      Knee extension 5/5 38.9, 41 lbs 4+/5 28, 24.3 lbs 5/5 42, 41.4 lbs 5/5 35, 31.3 lbs 48 lbs 40 lbs  Ankle dorsiflexion 5/5 5/5      Ankle plantarflexion        Ankle inversion        Ankle eversion         (Blank rows = not tested)   SPECIAL TESTS:  04/28/2023 No specific testing  FUNCTIONAL TESTS:  04/28/2023 18 inch chair transfer s UE assist but pain in knees noted.  Lt SLS 8  seconds Rt SLS 10 seconds  GAIT: 04/28/2023 Reduced TKE in stance on Lt leg with trendelenburg gait on Lt.                                                                                                                                                                                                                   TODAY'S TREATMENT:                                                                                                         DATE:  07/02/2023 Therex: UBE UE/LE seat 7 for ROM/mobility 10 mins  lvl 3.0  Incline gastroc stretch 30 sec x 3  Standing lumbar extension AROM x 5  Supine it band stretch with band 15 sec x 3  Supine bridge c tband blue hip abduction hold 2 x 10 2-3 sec hold in lift Supine clam shell blue band x 20 c contralateral leg isometric hold, performed bilaterally Supine SK to opposite shoulder 15 sec x 3 bilateral    TherActivity (strength to to improve stairs, transfers, and other daily activity ) Leg press double leg 118 lbs x20,  single leg  x 20 62 lbs bilaterally   Manual Percussive device Lt lateral hip/thigh, Lt posterior hip for soft tissue mobilization/ trigger point release.  Continued education on use for home as  she has gotten a device on her own.   TODAY'S TREATMENT:                                                                                                         DATE:  06/26/2023 Therex: UBE UE/LE seat 7 for ROM/mobility 10 mins  lvl 3.0  Sidelying hip abduction 2 x 10 bilateral  cues for positioning.     TherActivity (strength to to improve stairs, transfers, and other daily activity ) Leg press double leg 118 lbs x20,  single leg  x 20 62 lbs bilaterally  Machine weight leg extension double leg up, single leg lowering 10 lbs 2 x 15 performed bilaterally - improving quad control    Manual Percussive device Lt lateral hip/thigh, Lt posterior hip for soft tissue mobilization/ trigger point release.  Continued education on use for home  as she has gotten a device on her own.   TODAY'S TREATMENT:                                                                                                         DATE:  06/23/2023 Therex: UBE UE/LE seat 8 for ROM/mobility 10 mins  lvl 3.0  FOTO update and measurments updated Supine heelslides 5 sec X 10 bilat Seated knee flexion stretch 5 sec X 10 AAROM.Marland Kitchen Supine quad stretch with strap 30 sec X 3 Supine ITB stretch on left 30 sec X3   TherActivity (strength to to improve stairs, transfers, and other daily activity ) Leg press double leg 118 lbs 2x 10, single leg  x 15 75 lbs bilaterally  Machine weight leg extension double leg up, single leg lowering 10 lbs 2 x 10 performed bilaterally  Machine weight leg curl double legs 35# 2X10.    TODAY'S TREATMENT:                                                                                                         DATE:  06/09/2023 Therex: UBE UE/LE seat 8 for ROM/mobility 9 mins  lvl 3.0  Machine weight leg extension double leg up, single leg lowering 5 lbs 2 x 10 Lt, 10 lbs 2 x 10 Rt  Machine weight leg curl double leg pull, single leg return eccentrics 15 lbs x 15 bilaterally  Seated tband blue clam shells x 20 (to mimic machine weight) Seated ball hip adductor squeeze 5 sec hold x 10    Manual Percussive device Lt lateral hip/thigh, Lt posterior hip for soft tissue mobilization/ trigger point release.    PATIENT EDUCATION:  06/02/2023 Education details: HEP, POC Person educated: Patient Education method: Programmer, multimedia, Demonstration, Verbal cues, and Handouts Education comprehension: verbalized understanding, returned demonstration, and verbal cues required  HOME EXERCISE PROGRAM: Access Code: [email or web address] URL: [email or web address] Date: 06/02/2023 Prepared by: Chyrel Masson  Exercises - Supine Lower Trunk Rotation  - 1-2 x daily - 7 x weekly - 1 sets - 3-5 reps - 15 hold - Supine Bridge  - 1-2 x daily - 7 x weekly -  1-2 sets - 10 reps - 2 hold - Supine Single Knee to Chest Stretch  - 1-2 x daily - 7 x weekly - 1 sets - 5 reps - 15 hold - Seated Quad Set  - 1-2 x daily - 7 x weekly - 1 sets - 10 reps - 5 hold - Seated Straight Leg Heel Taps  - 1-2 x daily - 7 x weekly - 3 sets - 10 reps - Sit to Stand  - 3 x daily - 7 x weekly - 1 sets - 10 reps - Standing Hip Hiking  - 1-2 x daily - 7 x weekly - 1 sets - 10 reps - 5 hold - Step Up  - 1 x daily - 4-5 x weekly - 2-3 sets - 10-15 reps - Lateral Step Up  - 1 x daily - 4-5 x weekly - 2-3 sets - 10-15 reps  ASSESSMENT:  CLINICAL IMPRESSION: Spent additional time today on progressive back/hip mobility to help reduce symptoms reported upon arrival today.  Pt has reported and demonstrated continued slow but steady improvement in strength/tolerance to WB activity including stairs, transfers.  Continued skilled PT services warranted at this time.   OBJECTIVE IMPAIRMENTS: Abnormal gait, decreased activity tolerance, decreased balance, decreased coordination, decreased endurance, decreased mobility, difficulty walking, decreased ROM, decreased strength, hypomobility, increased fascial restrictions, impaired perceived functional ability, impaired flexibility, improper body mechanics, postural dysfunction, and pain.   ACTIVITY LIMITATIONS: carrying, lifting, bending, sitting, standing, squatting, sleeping, stairs, transfers, bed mobility, locomotion level, and caring for others  PARTICIPATION LIMITATIONS: meal prep, cleaning, laundry, interpersonal relationship, shopping, community activity, and occupation  PERSONAL FACTORS: Time since onset of injury/illness/exacerbation are also affecting patient's functional outcome.   REHAB POTENTIAL: Good  CLINICAL DECISION MAKING: Stable/uncomplicated  EVALUATION COMPLEXITY: Low   GOALS: Goals reviewed with patient? Yes  SHORT TERM GOALS: (target date for Short term goals are 3 weeks 05/19/2023)  1. Patient will  demonstrate independent use of home exercise program to maintain progress from in clinic treatments.  Goal status: met 05/19/2023  LONG TERM GOALS: (target dates for all long term goals are 10 weeks  07/07/2023 )   1. Patient will demonstrate/report pain at worst less than or equal to 2/10 to facilitate minimal limitation in daily activity secondary to pain symptoms.  Goal status: on going 06/23/2023   2. Patient will demonstrate independent use of home exercise program to facilitate ability to maintain/progress functional gains from skilled physical therapy services.  Goal status: on going 06/23/2023   3. Patient will demonstrate FOTO outcome > or = 65 % to indicate reduced disability due to condition.  Goal status: on going 06/23/2023  4. Patient will demonstrate lumbar extension 100 % WFL s symptoms to facilitate upright standing, walking posture at PLOF s limitation.  Goal status: MET 06/23/23   5.  Patient will demonstrate bilateral knee extension MMT 5/5, dynamometry testing > 45 lbs to facilitate stairs, ambulation and other daily activity at PLOF.   Goal status: on going 06/23/2023   6.  Patient will demonstrate bilateral SLS > 15 seconds to improve stability in ambulation.  Goal status: on going 06/23/2023   7.  Patient will demonstrate ascending/descending stairs reciprocally s UE assist for community integration.   Goal Status: on going 06/23/2023  PLAN:  PT FREQUENCY: 1-2x/week  PT DURATION: 10 weeks  PLANNED INTERVENTIONS: Can include [id number]- PT Re-evaluation, 97110-Therapeutic exercises, 97530- Therapeutic activity, 97112- Neuromuscular re-education, 97535- Self Care, 97140- Manual therapy, [phone]- Gait training, [id number]- Orthotic Fit/training, [phone]- Canalith repositioning, [id number]- Aquatic Therapy, [phone]- Electrical stimulation (unattended), 97750 Physical performance testing, [id number]- Electrical stimulation (manual), 97016- Vasopneumatic device, [id number]- Ultrasound, [id number]-  Traction (mechanical), [id number]- Ionotophoresis 4mg /ml Dexamethasone, Patient/Family education, Balance training, Stair training, Taping, Dry Needling, Joint mobilization, Joint manipulation, Spinal manipulation, Spinal mobilization, Scar mobilization, Vestibular training, Visual/preceptual remediation/compensation, DME instructions, Cryotherapy, and Moist heat.  All performed as medically necessary.  All included unless contraindicated  PLAN FOR NEXT SESSION: Continue progressive strengthening, back/hip mobility as necessary for symptom reduction.   LTG/ Recert after 4/8.   Chyrel Masson, PT, DPT, OCS, ATC 07/02/23  12:23 PM

## 2023-07-06 ENCOUNTER — Ambulatory Visit (INDEPENDENT_AMBULATORY_CARE_PROVIDER_SITE_OTHER): Admitting: Rehabilitative and Restorative Service Providers"

## 2023-07-06 ENCOUNTER — Encounter: Payer: Self-pay | Admitting: Rehabilitative and Restorative Service Providers"

## 2023-07-06 DIAGNOSIS — M5459 Other low back pain: Secondary | ICD-10-CM

## 2023-07-06 DIAGNOSIS — M79605 Pain in left leg: Secondary | ICD-10-CM | POA: Diagnosis not present

## 2023-07-06 DIAGNOSIS — M6281 Muscle weakness (generalized): Secondary | ICD-10-CM

## 2023-07-06 DIAGNOSIS — R262 Difficulty in walking, not elsewhere classified: Secondary | ICD-10-CM | POA: Diagnosis not present

## 2023-07-06 NOTE — Therapy (Signed)
 OUTPATIENT PHYSICAL THERAPY TREATMENT   Patient Name: Krystal Lang MRN: [id number] DOB:June 27, 1955, 68 y.o., female Today's Date: 07/06/2023  END OF SESSION:  PT End of Session - 07/06/23 1352     Visit Number 13    Number of Visits 20    Date for PT Re-Evaluation 07/07/23    Authorization Type Medicare/ AET[employer]    Progress Note Due on Visit 20    PT Start Time 1343    PT Stop Time 1422    PT Time Calculation (min) 39 min    Activity Tolerance Patient tolerated treatment well    Behavior During Therapy WFL for tasks assessed/performed                         Past Medical History:  Diagnosis Date   Anxiety    Arthritis    Cataract    Mixed form OU   Complication of anesthesia    after knee arthroscopy pateint difficult to wake up   Diabetes mellitus    Fibroid uterus 2001   H/O fatigue 1998   Headache(784.0)    denies   Hepatitis    A, 30 years ago   Hirsutism 1998   Idiopathic   History of chicken pox    Hypothyroidism    Menses, irregular 1998   Past Surgical History:  Procedure Laterality Date   COLONOSCOPY  02/2021   DILATION AND CURETTAGE OF UTERUS  03/31/2000   In [place]   KNEE ARTHROPLASTY Right    Replaced in [place], [place]   KNEE ARTHROSCOPY Right 2008   TONSILLECTOMY     As a child   TOTAL KNEE ARTHROPLASTY Left 08/21/2022   Procedure: LEFT TOTAL KNEE ARTHROPLASTY;  Surgeon: Kathryne Hitch, MD;  Location: [facility];  Service: Orthopedics;  Laterality: Left;   Patient Active Problem List   Diagnosis Date Noted   Status post total left knee replacement 08/21/2022   Dyspareunia, female 11/13/2011   Type 2 diabetes mellitus (HCC) 11/13/2011   Alopecia 11/13/2011   History of chicken pox    Headache    Hypothyroidism    Menses, irregular    Hirsutism    H/O fatigue    Fibroid uterus     PCP: Orpah Cobb MD  REFERRING PROVIDER: London Sheer, MD  REFERRING DIAG: M54.50 (ICD-10-CM) - Low back pain, unspecified back pain  laterality, unspecified chronicity, unspecified whether sciatica present  Rationale for Evaluation and Treatment: Rehabilitation  THERAPY DIAG:  Other low back pain  Pain in left leg  Muscle weakness (generalized)  Difficulty in walking, not elsewhere classified  ONSET DATE: 07/2022  SUBJECTIVE:  SUBJECTIVE STATEMENT: Pt indicated doing exercise yesterday.  Pt indicated doing 20 bridges and then she got off table knees were sore and sore this morning.  Pt indicated holding bridges for 30 seconds.    Pt indicated back 2/10.    PERTINENT HISTORY:  PMH significant for anxiety, OA, DM, hypothyroidism, and Lt TKA 08/21/22.  Rt TKA  PAIN:  NPRS scale: knees:  4/10 Pain location:knee at times Pain description: constant, achy Aggravating factors: standing, walking, stairs, sitting prolonged at times for back.  WB pressure Relieving factors: nothing specific   PRECAUTIONS: None  WEIGHT BEARING RESTRICTIONS: No  FALLS:  Has patient fallen in last 6 months? 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   PLOF: Independent, gym based activity  PATIENT GOALS: Get pain better, walk for exercise, go to gym  OBJECTIVE:   PATIENT SURVEYS:  06/23/23 FOTO updated 56%  05/28/2023: FOTO update:  67  04/28/2023 FOTO eval: 56   predicted:  65  SCREENING FOR RED FLAGS: 04/28/2023 Bowel or bladder incontinence: No Cauda equina syndrome: No  COGNITION: 04/28/2023 Overall cognitive status: WFL normal      SENSATION: 04/28/2023 Not tested  MUSCLE LENGTH: 04/28/2023 Not performed today.   POSTURE:  04/28/2023 Reduced lumbar lordosis in standing.    PALPATION: 04/28/2023 Tenderness noted in distal Lt quad, proximal incision. Glute max/med and side of thigh also tender on Lt side  LUMBAR ROM:  04/28/2023 Directional Preference Assessment: Centralization: Peripheralization:   AROM 04/28/2023 05/19/2023 06/23/23 07/02/2023  Flexion To ankles no complaints   WNL   Extension 75% WFL c Rt lumbar pain noted (different than chief complaint)  100 % WFL WNL 100 % WFL  Right lateral flexion      Left lateral flexion      Right rotation To knee joint  WNL   Left rotation To lateral femoral epicondyle   WNL    (Blank rows = not tested)  LOWER EXTREMITY ROM:      Right 04/28/2023 Left 04/28/2023 Right 05/19/2023 Left 05/19/2023 Right 05/28/2023 Left 05/28/2023 Right 06/11/2023 Left 06/11/2023 Rt/Lt  Hip flexion           Hip extension           Hip abduction           Hip adduction           Hip internal rotation           Hip external rotation           Knee flexion  102 AROM in heel slide 100 AROM heel slide 105 AROM heel slide 107 AROM heel slide 109 AROM heel slide 107 AROM heel slide 108 AROM heel slide 105/106 AAROM  Knee extension  0 AROM seated heel slide         Ankle dorsiflexion           Ankle plantarflexion           Ankle inversion           Ankle eversion            (Blank rows = not tested)  LOWER EXTREMITY MMT:    MMT Right 04/28/2023 Left 04/28/2023 Right 06/02/2023 Left 06/02/2023 Rt 06/23/2023 Left 06/23/2023  Hip flexion 5/5 5/5      Hip extension        Hip abduction  Hip adduction        Hip internal rotation        Hip external rotation        Knee flexion 5/5 5/5      Knee extension 5/5 38.9, 41 lbs 4+/5 28, 24.3 lbs 5/5 42, 41.4 lbs 5/5 35, 31.3 lbs 48 lbs 40 lbs  Ankle dorsiflexion 5/5 5/5      Ankle plantarflexion        Ankle inversion        Ankle eversion         (Blank rows = not tested)   SPECIAL TESTS:  04/28/2023 No specific testing  FUNCTIONAL TESTS:  04/28/2023 18 inch chair  transfer s UE assist but pain in knees noted.  Lt SLS 8 seconds Rt SLS 10 seconds  GAIT: 04/28/2023 Reduced TKE in stance on Lt leg with trendelenburg gait on Lt.                                                                                                                                                                                                                   TODAY'S TREATMENT:                                                                                                         DATE:  07/06/2023 Therex: UBE UE/LE seat 9 for ROM/mobility 10 mins  lvl 1 Wall squat with ball x 10, squat hold in mid range x 3 to fatigue (15-30 seconds) Machine leg extension double leg up, single leg lowering 5 lbs 2 x 15 bilateral   Verbal cues to avoid long duration holds in bridges to match HEP performance to in clinic performance.    TherActivity (strength to to improve stairs, transfers, and other daily activity ) Leg press double leg 118 lbs x20,  single leg  x 20 62 lbs bilaterally  Step on over and down 4 inch step x 10 bilaterally    TODAY'S TREATMENT:  DATE:  07/02/2023 Therex: UBE UE/LE seat 7 for ROM/mobility 10 mins  lvl 3.0  Incline gastroc stretch 30 sec x 3  Standing lumbar extension AROM x 5  Supine it band stretch with band 15 sec x 3  Supine bridge c tband blue hip abduction hold 2 x 10 2-3 sec hold in lift Supine clam shell blue band x 20 c contralateral leg isometric hold, performed bilaterally Supine SK to opposite shoulder 15 sec x 3 bilateral    TherActivity (strength to to improve stairs, transfers, and other daily activity ) Leg press double leg 118 lbs x20,  single leg  x 20 62 lbs bilaterally   Manual Percussive device Lt lateral hip/thigh, Lt posterior hip for soft tissue mobilization/ trigger point release.  Continued education on use for home as she has gotten  a device on her own.   TODAY'S TREATMENT:                                                                                                         DATE:  06/26/2023 Therex: UBE UE/LE seat 7 for ROM/mobility 10 mins  lvl 3.0  Sidelying hip abduction 2 x 10 bilateral  cues for positioning.     TherActivity (strength to to improve stairs, transfers, and other daily activity ) Leg press double leg 118 lbs x20,  single leg  x 20 62 lbs bilaterally  Machine weight leg extension double leg up, single leg lowering 10 lbs 2 x 15 performed bilaterally - improving quad control    Manual Percussive device Lt lateral hip/thigh, Lt posterior hip for soft tissue mobilization/ trigger point release.  Continued education on use for home as she has gotten a device on her own.   TODAY'S TREATMENT:                                                                                                         DATE:  06/23/2023 Therex: UBE UE/LE seat 8 for ROM/mobility 10 mins  lvl 3.0  FOTO update and measurments updated Supine heelslides 5 sec X 10 bilat Seated knee flexion stretch 5 sec X 10 AAROM.Marland Kitchen Supine quad stretch with strap 30 sec X 3 Supine ITB stretch on left 30 sec X3   TherActivity (strength to to improve stairs, transfers, and other daily activity ) Leg press double leg 118 lbs 2x 10, single leg  x 15 75 lbs bilaterally  Machine weight leg extension double leg up, single leg lowering 10 lbs 2 x 10 performed bilaterally  Machine weight leg curl double legs 35# 2X10.    TODAY'S TREATMENT:  DATE:  06/09/2023 Therex: UBE UE/LE seat 8 for ROM/mobility 9 mins  lvl 3.0  Machine weight leg extension double leg up, single leg lowering 5 lbs 2 x 10 Lt, 10 lbs 2 x 10 Rt  Machine weight leg curl double leg pull, single leg return eccentrics 15 lbs x 15 bilaterally  Seated tband blue clam shells x 20 (to mimic  machine weight) Seated ball hip adductor squeeze 5 sec hold x 10    Manual Percussive device Lt lateral hip/thigh, Lt posterior hip for soft tissue mobilization/ trigger point release.    PATIENT EDUCATION:  06/02/2023 Education details: HEP, POC Person educated: Patient Education method: Programmer, multimedia, Demonstration, Verbal cues, and Handouts Education comprehension: verbalized understanding, returned demonstration, and verbal cues required  HOME EXERCISE PROGRAM: Access Code: [email or web address] URL: [email or web address] Date: 06/02/2023 Prepared by: Chyrel Masson  Exercises - Supine Lower Trunk Rotation  - 1-2 x daily - 7 x weekly - 1 sets - 3-5 reps - 15 hold - Supine Bridge  - 1-2 x daily - 7 x weekly - 1-2 sets - 10 reps - 2 hold - Supine Single Knee to Chest Stretch  - 1-2 x daily - 7 x weekly - 1 sets - 5 reps - 15 hold - Seated Quad Set  - 1-2 x daily - 7 x weekly - 1 sets - 10 reps - 5 hold - Seated Straight Leg Heel Taps  - 1-2 x daily - 7 x weekly - 3 sets - 10 reps - Sit to Stand  - 3 x daily - 7 x weekly - 1 sets - 10 reps - Standing Hip Hiking  - 1-2 x daily - 7 x weekly - 1 sets - 10 reps - 5 hold - Step Up  - 1 x daily - 4-5 x weekly - 2-3 sets - 10-15 reps - Lateral Step Up  - 1 x daily - 4-5 x weekly - 2-3 sets - 10-15 reps  ASSESSMENT:  CLINICAL IMPRESSION: Continued verbal review of HEP to help improve knowledge and confidence in use.   Continued emphasis in education regarding presentation about long duration goal focus on strength and mobility gains for both legs and back.  Improvements over time.   Walking program review for emphasis on walking distances that challenge but don't increase pain response.   OBJECTIVE IMPAIRMENTS: Abnormal gait, decreased activity tolerance, decreased balance, decreased coordination, decreased endurance, decreased mobility, difficulty walking, decreased ROM, decreased strength, hypomobility, increased fascial restrictions,  impaired perceived functional ability, impaired flexibility, improper body mechanics, postural dysfunction, and pain.   ACTIVITY LIMITATIONS: carrying, lifting, bending, sitting, standing, squatting, sleeping, stairs, transfers, bed mobility, locomotion level, and caring for others  PARTICIPATION LIMITATIONS: meal prep, cleaning, laundry, interpersonal relationship, shopping, community activity, and occupation  PERSONAL FACTORS: Time since onset of injury/illness/exacerbation are also affecting patient's functional outcome.   REHAB POTENTIAL: Good  CLINICAL DECISION MAKING: Stable/uncomplicated  EVALUATION COMPLEXITY: Low   GOALS: Goals reviewed with patient? Yes  SHORT TERM GOALS: (target date for Short term goals are 3 weeks 05/19/2023)  1. Patient will demonstrate independent use of home exercise program to maintain progress from in clinic treatments.  Goal status: met 05/19/2023  LONG TERM GOALS: (target dates for all long term goals are 10 weeks  07/07/2023 )   1. Patient will demonstrate/report pain at worst less than or equal to 2/10 to facilitate minimal limitation in daily activity secondary to pain symptoms.  Goal status: on going 06/23/2023   2. Patient  will demonstrate independent use of home exercise program to facilitate ability to maintain/progress functional gains from skilled physical therapy services.  Goal status: on going 06/23/2023   3. Patient will demonstrate FOTO outcome > or = 65 % to indicate reduced disability due to condition.  Goal status: on going 06/23/2023   4. Patient will demonstrate lumbar extension 100 % WFL s symptoms to facilitate upright standing, walking posture at PLOF s limitation.  Goal status: MET 06/23/23   5.  Patient will demonstrate bilateral knee extension MMT 5/5, dynamometry testing > 45 lbs to facilitate stairs, ambulation and other daily activity at PLOF.   Goal status: on going 06/23/2023   6.  Patient will demonstrate  bilateral SLS > 15 seconds to improve stability in ambulation.  Goal status: on going 06/23/2023   7.  Patient will demonstrate ascending/descending stairs reciprocally s UE assist for community integration.   Goal Status: on going 06/23/2023  PLAN:  PT FREQUENCY: 1-2x/week  PT DURATION: 10 weeks  PLANNED INTERVENTIONS: Can include 16109- PT Re-evaluation, 97110-Therapeutic exercises, 97530- Therapeutic activity, 97112- Neuromuscular re-education, 97535- Self Care, 97140- Manual therapy, [phone]- Gait training, [phone]- Orthotic Fit/training, [phone]- Canalith repositioning, [id number]- Aquatic Therapy, [phone]- Electrical stimulation (unattended), 97750 Physical performance testing, [phone]- Electrical stimulation (manual), 97016- Vasopneumatic device, [id number]- Ultrasound, [id number]- Traction (mechanical), [id number]- Ionotophoresis 4mg /ml Dexamethasone, Patient/Family education, Balance training, Stair training, Taping, Dry Needling, Joint mobilization, Joint manipulation, Spinal manipulation, Spinal mobilization, Scar mobilization, Vestibular training, Visual/preceptual remediation/compensation, DME instructions, Cryotherapy, and Moist heat.  All performed as medically necessary.  All included unless contraindicated  PLAN FOR NEXT SESSION:   LTG/ Recert after 4/8.   Chyrel Masson, PT, DPT, OCS, ATC 07/06/23  2:22 PM

## 2023-07-08 ENCOUNTER — Ambulatory Visit (INDEPENDENT_AMBULATORY_CARE_PROVIDER_SITE_OTHER): Admitting: Rehabilitative and Restorative Service Providers"

## 2023-07-08 ENCOUNTER — Encounter: Payer: Self-pay | Admitting: Rehabilitative and Restorative Service Providers"

## 2023-07-08 DIAGNOSIS — M6281 Muscle weakness (generalized): Secondary | ICD-10-CM | POA: Diagnosis not present

## 2023-07-08 DIAGNOSIS — R262 Difficulty in walking, not elsewhere classified: Secondary | ICD-10-CM | POA: Diagnosis not present

## 2023-07-08 DIAGNOSIS — M5459 Other low back pain: Secondary | ICD-10-CM | POA: Diagnosis not present

## 2023-07-08 DIAGNOSIS — M79605 Pain in left leg: Secondary | ICD-10-CM | POA: Diagnosis not present

## 2023-07-08 NOTE — Therapy (Signed)
 OUTPATIENT PHYSICAL THERAPY TREATMENT / PROGRESS NOTE/ RECERT   Patient Name: Krystal Lang MRN: [id number] DOB:26-Apr-1955, 68 y.o., female Today's Date: 07/08/2023  Progress Note Reporting Period 06/23/2023 to 07/08/2023  See note below for Objective Data and Assessment of Progress/Goals.    END OF SESSION:  PT End of Session - 07/08/23 1409     Visit Number 14    Number of Visits 22    Date for PT Re-Evaluation 07/07/23    Authorization Type Medicare/ [employer]ET[employer]    Progress Note Due on Visit 20    PT Start Time 1351    PT Stop Time 1429    PT Time Calculation (min) 38 min    Activity Tolerance Patient tolerated treatment well    Behavior During Therapy WFL for tasks assessed/performed                          Past Medical History:  Diagnosis Date   Anxiety    Arthritis    Cataract    Mixed form OU   Complication of anesthesia    after knee arthroscopy pateint difficult to wake up   Diabetes mellitus    Fibroid uterus 2001   H/O fatigue 1998   Headache(784.0)    denies   Hepatitis    A, 30 years ago   Hirsutism 1998   Idiopathic   History of chicken pox    Hypothyroidism    Menses, irregular 1998   Past Surgical History:  Procedure Laterality Date   COLONOSCOPY  02/2021   DILATION AND CURETTAGE OF UTERUS  03/31/2000   In [place]   KNEE ARTHROPLASTY Right    Replaced in [place], [place]   KNEE ARTHROSCOPY Right 2008   TONSILLECTOMY     As a child   TOTAL KNEE ARTHROPLASTY Left 08/21/2022   Procedure: LEFT TOTAL KNEE ARTHROPLASTY;  Surgeon: Kathryne Hitch, MD;  Location: [facility];  Service: Orthopedics;  Laterality: Left;   Patient Active Problem List   Diagnosis Date Noted   Status post total left knee replacement 08/21/2022   Dyspareunia, female 11/13/2011   Type 2 diabetes mellitus (HCC) 11/13/2011   Alopecia 11/13/2011   History of chicken pox    Headache    Hypothyroidism    Menses, irregular    Hirsutism    H/O fatigue    Fibroid  uterus     PCP: Orpah Cobb MD  REFERRING PROVIDER: London Sheer, MD  REFERRING DIAG: M54.50 (ICD-10-CM) - Low back pain, unspecified back pain laterality, unspecified chronicity, unspecified whether sciatica present  Rationale for Evaluation and Treatment: Rehabilitation  THERAPY DIAG:  Other low back pain  Pain in left leg  Muscle weakness (generalized)  Difficulty in walking, not elsewhere classified  ONSET DATE: 07/2022  SUBJECTIVE:  SUBJECTIVE STATEMENT: Pt indicated overall improvement for back at +5 quite a bit better on global rate of change.    Knees reated at +4 moderately better.  Pt indicated variable symptoms in back and knees.    PERTINENT HISTORY:  PMH significant for anxiety, OA, DM, hypothyroidism, and Lt TKA 08/21/22.  Rt TKA  PAIN:  NPRS scale: mild to moderate reported today Pain location:knee at times Pain description: constant, achy Aggravating factors: standing, walking, stairs, sitting prolonged at times for back.  WB pressure Relieving factors: nothing specific   PRECAUTIONS: None  WEIGHT BEARING RESTRICTIONS: No  FALLS:  Has patient fallen in last 6 months? 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   PLOF: Independent, gym based activity  PATIENT GOALS: Get pain better, walk for exercise, go to gym  OBJECTIVE:   PATIENT SURVEYS:    06/23/23 FOTO updated 56%  05/28/2023: FOTO update:  67  04/28/2023 FOTO eval: 56   predicted:  65  SCREENING FOR RED FLAGS: 04/28/2023 Bowel or bladder incontinence: No Cauda equina syndrome: No  COGNITION: 04/28/2023 Overall cognitive status: WFL normal      SENSATION: 04/28/2023 Not  tested  MUSCLE LENGTH: 04/28/2023 Not performed today.   POSTURE:  04/28/2023 Reduced lumbar lordosis in standing.   PALPATION: 04/28/2023 Tenderness noted in distal Lt quad, proximal incision. Glute max/med and side of thigh also tender on Lt side  LUMBAR ROM:  04/28/2023 Directional Preference Assessment: Centralization: Peripheralization:   AROM 04/28/2023 05/19/2023 06/23/23 07/02/2023 07/08/2023  Flexion To ankles no complaints   WNL    Extension 75% WFL c Rt lumbar pain noted (different than chief complaint)  100 % WFL WNL 100 % WFL 100 % WFL without pain   Right lateral flexion       Left lateral flexion       Right rotation To knee joint  WNL    Left rotation To lateral femoral epicondyle   WNL     (Blank rows = not tested)  LOWER EXTREMITY ROM:      Left 04/28/2023 Right 05/19/2023 Left 05/19/2023 Right 05/28/2023 Left 05/28/2023 Right 06/11/2023 Left 06/11/2023 Right 07/08/2023 Left 07/08/2023  Hip flexion           Hip extension           Hip abduction           Hip adduction           Hip internal rotation           Hip external rotation           Knee flexion 102 AROM in heel slide 100 AROM heel slide 105 AROM heel slide 107 AROM heel slide 109 AROM heel slide 107 AROM heel slide 108 AROM heel slide 108 AROM supine heel slide 108 AROM supine heel slide  Knee extension 0 AROM seated heel slide       0 0  Ankle dorsiflexion           Ankle plantarflexion           Ankle inversion           Ankle eversion            (Blank rows = not tested)  LOWER EXTREMITY MMT:    MMT Right 04/28/2023 Left 04/28/2023 Right 06/02/2023 Left 06/02/2023 Right 07/08/2023 Left 07/08/2023  Hip flexion 5/5 5/5  Hip extension        Hip abduction        Hip adduction        Hip internal rotation        Hip external rotation        Knee flexion 5/5 5/5      Knee extension 5/5 38.9, 41 lbs 4+/5 28, 24.3 lbs 5/5 42, 41.4 lbs 5/5 35, 31.3 lbs 5/5 44.5, 44 lbs 5/5 40, 41 lbs  Ankle  dorsiflexion 5/5 5/5      Ankle plantarflexion        Ankle inversion        Ankle eversion         (Blank rows = not tested)   SPECIAL TESTS:  04/28/2023 No specific testing  FUNCTIONAL TESTS:  07/08/2023:  18 inch chair transfer s UE assist but pain in knees noted.   04/28/2023 18 inch chair transfer s UE assist but pain in knees noted.  Lt SLS 8 seconds Rt SLS 10 seconds  GAIT: 04/28/2023 Reduced TKE in stance on Lt leg with trendelenburg gait on Lt.                                                                                                                                                                                                                   TODAY'S TREATMENT:                                                                                                         DATE:  07/08/2023 Therex: Recumbent bike seat 6 and then seat 5 for ROM x 10  Machine leg extension double leg up, single leg lowering 10 lbs 2 x 10 bilateral  Machine single leg curls 10 lbs x 15 slowly , performed bilaterally  Seated SLR x 15 slowly , performed bilaterally   Verbal review continued related to at home strengthening and machine adjustment based off performance.   TherActivity (strength to to improve stairs, transfers, and other daily activity ) Leg press double leg 118 lbs x20,  single leg  x 20 62 lbs bilaterally    TODAY'S TREATMENT:                                                                                                         DATE:  07/06/2023 Therex: UBE UE/LE seat 9 for ROM/mobility 10 mins  lvl 1 Wall squat with ball x 10, squat hold in mid range x 3 to fatigue (15-30 seconds) Machine leg extension double leg up, single leg lowering 5 lbs 2 x 15 bilateral   Verbal cues to avoid long duration holds in bridges to match HEP performance to in clinic performance.    TherActivity (strength to to improve stairs, transfers, and other daily activity ) Leg press double leg 118 lbs  x20,  single leg  x 20 62 lbs bilaterally  Step on over and down 4 inch step x 10 bilaterally    TODAY'S TREATMENT:                                                                                                         DATE:  07/02/2023 Therex: UBE UE/LE seat 7 for ROM/mobility 10 mins  lvl 3.0  Incline gastroc stretch 30 sec x 3  Standing lumbar extension AROM x 5  Supine it band stretch with band 15 sec x 3  Supine bridge c tband blue hip abduction hold 2 x 10 2-3 sec hold in lift Supine clam shell blue band x 20 c contralateral leg isometric hold, performed bilaterally Supine SK to opposite shoulder 15 sec x 3 bilateral    TherActivity (strength to to improve stairs, transfers, and other daily activity ) Leg press double leg 118 lbs x20,  single leg  x 20 62 lbs bilaterally   Manual Percussive device Lt lateral hip/thigh, Lt posterior hip for soft tissue mobilization/ trigger point release.  Continued education on use for home as she has gotten a device on her own.   TODAY'S TREATMENT:                                                                                                         DATE:  06/26/2023 Therex: UBE UE/LE seat 7 for  ROM/mobility 10 mins  lvl 3.0  Sidelying hip abduction 2 x 10 bilateral  cues for positioning.     TherActivity (strength to to improve stairs, transfers, and other daily activity ) Leg press double leg 118 lbs x20,  single leg  x 20 62 lbs bilaterally  Machine weight leg extension double leg up, single leg lowering 10 lbs 2 x 15 performed bilaterally - improving quad control    Manual Percussive device Lt lateral hip/thigh, Lt posterior hip for soft tissue mobilization/ trigger point release.  Continued education on use for home as she has gotten a device on her own.   TODAY'S TREATMENT:                                                                                                         DATE:  06/23/2023 Therex: UBE UE/LE seat 8 for  ROM/mobility 10 mins  lvl 3.0  FOTO update and measurments updated Supine heelslides 5 sec X 10 bilat Seated knee flexion stretch 5 sec X 10 AAROM.Marland Kitchen Supine quad stretch with strap 30 sec X 3 Supine ITB stretch on left 30 sec X3   TherActivity (strength to to improve stairs, transfers, and other daily activity ) Leg press double leg 118 lbs 2x 10, single leg  x 15 75 lbs bilaterally  Machine weight leg extension double leg up, single leg lowering 10 lbs 2 x 10 performed bilaterally  Machine weight leg curl double legs 35# 2X10.     PATIENT EDUCATION:  06/02/2023 Education details: HEP, POC Person educated: Patient Education method: Programmer, multimedia, Demonstration, Verbal cues, and Handouts Education comprehension: verbalized understanding, returned demonstration, and verbal cues required  HOME EXERCISE PROGRAM: Access Code: [email or web address] URL: [email or web address] Date: 06/02/2023 Prepared by: Chyrel Masson  Exercises - Supine Lower Trunk Rotation  - 1-2 x daily - 7 x weekly - 1 sets - 3-5 reps - 15 hold - Supine Bridge  - 1-2 x daily - 7 x weekly - 1-2 sets - 10 reps - 2 hold - Supine Single Knee to Chest Stretch  - 1-2 x daily - 7 x weekly - 1 sets - 5 reps - 15 hold - Seated Quad Set  - 1-2 x daily - 7 x weekly - 1 sets - 10 reps - 5 hold - Seated Straight Leg Heel Taps  - 1-2 x daily - 7 x weekly - 3 sets - 10 reps - Sit to Stand  - 3 x daily - 7 x weekly - 1 sets - 10 reps - Standing Hip Hiking  - 1-2 x daily - 7 x weekly - 1 sets - 10 reps - 5 hold - Step Up  - 1 x daily - 4-5 x weekly - 2-3 sets - 10-15 reps - Lateral Step Up  - 1 x daily - 4-5 x weekly - 2-3 sets - 10-15 reps  ASSESSMENT:  CLINICAL IMPRESSION: The patient has attended 14 visits over the course of treatment cycle.  Patient has reported overall improvement at +5 for back and +4 for knees in global  rating of change.  See objective data above for updated information regarding current presentation.  Medical necessity for continued skilled PT services indicated to continue to improve lumbar and knee mobility, strength and balance to improve functional ability for stair navigation, ambulation, transfers  and household activity.  To this point, gains have been noted slowly over time, specifically in mobility and strength of knees but gains have been noted.    OBJECTIVE IMPAIRMENTS: Abnormal gait, decreased activity tolerance, decreased balance, decreased coordination, decreased endurance, decreased mobility, difficulty walking, decreased ROM, decreased strength, hypomobility, increased fascial restrictions, impaired perceived functional ability, impaired flexibility, improper body mechanics, postural dysfunction, and pain.   ACTIVITY LIMITATIONS: carrying, lifting, bending, sitting, standing, squatting, sleeping, stairs, transfers, bed mobility, locomotion level, and caring for others  PARTICIPATION LIMITATIONS: meal prep, cleaning, laundry, interpersonal relationship, shopping, community activity, and occupation  PERSONAL FACTORS: Time since onset of injury/illness/exacerbation are also affecting patient's functional outcome.   REHAB POTENTIAL: Good  CLINICAL DECISION MAKING: Stable/uncomplicated  EVALUATION COMPLEXITY: Low   GOALS: Goals reviewed with patient? Yes  SHORT TERM GOALS: (target date for Short term goals are 3 weeks 05/19/2023)  1. Patient will demonstrate independent use of home exercise program to maintain progress from in clinic treatments.  Goal status: met 05/19/2023  LONG TERM GOALS: (target dates for all long term goals are 8 weeks  09/02/2023 )   1. Patient will demonstrate/report pain at worst less than or equal to 2/10 to facilitate minimal limitation in daily activity secondary to pain symptoms.  Goal status: revised date - 07/08/2023   2. Patient will demonstrate independent use of home exercise program to facilitate ability to maintain/progress functional gains  from skilled physical therapy services.  Goal status: revised date - 07/08/2023     3. Patient will demonstrate FOTO outcome > or = 65 % to indicate reduced disability due to condition.  Goal status: revised date - 07/08/2023   4. Patient will demonstrate lumbar extension 100 % WFL s symptoms to facilitate upright standing, walking posture at PLOF s limitation.  Goal status: MET 06/23/23   5.  Patient will demonstrate bilateral knee extension MMT 5/5, dynamometry testing > 45 lbs to facilitate stairs, ambulation and other daily activity at PLOF.   Goal status: revised date - 07/08/2023   6.  Patient will demonstrate bilateral SLS > 15 seconds to improve stability in ambulation.  Goal status: revised date - 07/08/2023   7.  Patient will demonstrate ascending/descending stairs reciprocally s UE assist for community integration.   Goal Status: revised date - 07/08/2023  PLAN:  PT FREQUENCY: 1-2x/week  PT DURATION: 8 weeks  PLANNED INTERVENTIONS: Can include 16109- PT Re-evaluation, 97110-Therapeutic exercises, 97530- Therapeutic activity, 97112- Neuromuscular re-education, 97535- Self Care, 97140- Manual therapy, [phone]- Gait training, [phone]- Orthotic Fit/training, [phone]- Canalith repositioning, [id number]- Aquatic Therapy, [phone]- Electrical stimulation (unattended), 97750 Physical performance testing, [phone]- Electrical stimulation (manual), 97016- Vasopneumatic device, [id number]- Ultrasound, [id number]- Traction (mechanical), [id number]- Ionotophoresis 4mg /ml Dexamethasone, Patient/Family education, Balance training, Stair training, Taping, Dry Needling, Joint mobilization, Joint manipulation, Spinal manipulation, Spinal mobilization, Scar mobilization, Vestibular training, Visual/preceptual remediation/compensation, DME instructions, Cryotherapy, and Moist heat.  All performed as medically necessary.  All included unless contraindicated  PLAN FOR NEXT SESSION:   Progress as able for strength/mobility.  Looking at  1x/week over next week.   Chyrel Masson, PT, DPT, OCS, ATC 07/08/23  2:42 PM

## 2023-07-09 ENCOUNTER — Other Ambulatory Visit (HOSPITAL_COMMUNITY): Payer: Self-pay | Admitting: Endocrinology

## 2023-07-09 DIAGNOSIS — E1165 Type 2 diabetes mellitus with hyperglycemia: Secondary | ICD-10-CM

## 2023-07-13 ENCOUNTER — Ambulatory Visit (INDEPENDENT_AMBULATORY_CARE_PROVIDER_SITE_OTHER): Admitting: Rehabilitative and Restorative Service Providers"

## 2023-07-13 ENCOUNTER — Encounter: Payer: Self-pay | Admitting: Rehabilitative and Restorative Service Providers"

## 2023-07-13 DIAGNOSIS — R600 Localized edema: Secondary | ICD-10-CM

## 2023-07-13 DIAGNOSIS — M5459 Other low back pain: Secondary | ICD-10-CM | POA: Diagnosis not present

## 2023-07-13 DIAGNOSIS — M79605 Pain in left leg: Secondary | ICD-10-CM | POA: Diagnosis not present

## 2023-07-13 DIAGNOSIS — M6281 Muscle weakness (generalized): Secondary | ICD-10-CM

## 2023-07-13 DIAGNOSIS — M25562 Pain in left knee: Secondary | ICD-10-CM

## 2023-07-13 DIAGNOSIS — R262 Difficulty in walking, not elsewhere classified: Secondary | ICD-10-CM

## 2023-07-13 NOTE — Therapy (Signed)
 OUTPATIENT PHYSICAL THERAPY TREATMENT   Patient Name: Krystal Lang MRN: [id number] DOB:09/10/1955, 68 y.o., female Today's Date: 07/13/2023  END OF SESSION:  PT End of Session - 07/13/23 1356     Visit Number 15    Number of Visits 22    Date for PT Re-Evaluation 07/07/23    Authorization Type Medicare/ [employer]ET[employer]    Progress Note Due on Visit 20    PT Start Time 1348    PT Stop Time 1427    PT Time Calculation (min) 39 min    Activity Tolerance Patient tolerated treatment well    Behavior During Therapy WFL for tasks assessed/performed              Past Medical History:  Diagnosis Date   Anxiety    Arthritis    Cataract    Mixed form OU   Complication of anesthesia    after knee arthroscopy pateint difficult to wake up   Diabetes mellitus    Fibroid uterus 2001   H/O fatigue 1998   Headache(784.0)    denies   Hepatitis    A, 30 years ago   Hirsutism 1998   Idiopathic   History of chicken pox    Hypothyroidism    Menses, irregular 1998   Past Surgical History:  Procedure Laterality Date   COLONOSCOPY  02/2021   DILATION AND CURETTAGE OF UTERUS  03/31/2000   In [place]   KNEE ARTHROPLASTY Right    Replaced in [place], [place]   KNEE ARTHROSCOPY Right 2008   TONSILLECTOMY     As a child   TOTAL KNEE ARTHROPLASTY Left 08/21/2022   Procedure: LEFT TOTAL KNEE ARTHROPLASTY;  Surgeon: Arnie Lao, MD;  Location: [facility];  Service: Orthopedics;  Laterality: Left;   Patient Active Problem List   Diagnosis Date Noted   Status post total left knee replacement 08/21/2022   Dyspareunia, female 11/13/2011   Type 2 diabetes mellitus (HCC) 11/13/2011   Alopecia 11/13/2011   History of chicken pox    Headache    Hypothyroidism    Menses, irregular    Hirsutism    H/O fatigue    Fibroid uterus     PCP: Pasqual Bone MD  REFERRING PROVIDER: Diedra Fowler, MD  REFERRING DIAG: M54.50 (ICD-10-CM) - Low back pain, unspecified back pain laterality,  unspecified chronicity, unspecified whether sciatica present  Rationale for Evaluation and Treatment: Rehabilitation  THERAPY DIAG:  Other low back pain  Pain in left leg  Muscle weakness (generalized)  Difficulty in walking, not elsewhere classified  Acute pain of left knee  Localized edema  ONSET DATE: 07/2022  SUBJECTIVE:  SUBJECTIVE STATEMENT: Pt indicated overall improvement for back at +5 quite a bit better on global rate of change.    Knees reated at +4 moderately better.  Pt indicated variable symptoms in back and knees.    PERTINENT HISTORY:  PMH significant for anxiety, OA, DM, hypothyroidism, and Lt TKA 08/21/22.  Rt TKA  PAIN:  NPRS scale: mild to moderate reported today Pain location:knee at times Pain description: constant, achy Aggravating factors: standing, walking, stairs, sitting prolonged at times for back.  WB pressure Relieving factors: nothing specific   PRECAUTIONS: None  WEIGHT BEARING RESTRICTIONS: No  FALLS:  Has patient fallen in last 6 months? 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   PLOF: Independent, gym based activity  PATIENT GOALS: Get pain better, walk for exercise, go to gym  OBJECTIVE:   PATIENT SURVEYS:    06/23/23 FOTO updated 56%  05/28/2023: FOTO update:  67  04/28/2023 FOTO eval: 56   predicted:  65  SCREENING FOR RED FLAGS: 04/28/2023 Bowel or bladder incontinence: No Cauda equina syndrome: No  COGNITION: 04/28/2023 Overall cognitive status: WFL normal      SENSATION: 04/28/2023 Not tested  MUSCLE LENGTH: 04/28/2023 Not performed today.   POSTURE:  04/28/2023 Reduced lumbar lordosis in standing.    PALPATION: 04/28/2023 Tenderness noted in distal Lt quad, proximal incision. Glute max/med and side of thigh also tender on Lt side  LUMBAR ROM:  04/28/2023 Directional Preference Assessment: Centralization: Peripheralization:   AROM 04/28/2023 05/19/2023 06/23/23 07/02/2023 07/08/2023  Flexion To ankles no complaints   WNL    Extension 75% WFL c Rt lumbar pain noted (different than chief complaint)  100 % WFL WNL 100 % WFL 100 % WFL without pain   Right lateral flexion       Left lateral flexion       Right rotation To knee joint  WNL    Left rotation To lateral femoral epicondyle   WNL     (Blank rows = not tested)  LOWER EXTREMITY ROM:      Left 04/28/2023 Right 05/19/2023 Left 05/19/2023 Right 05/28/2023 Left 05/28/2023 Right 06/11/2023 Left 06/11/2023 Right 07/08/2023 Left 07/08/2023  Hip flexion           Hip extension           Hip abduction           Hip adduction           Hip internal rotation           Hip external rotation           Knee flexion 102 AROM in heel slide 100 AROM heel slide 105 AROM heel slide 107 AROM heel slide 109 AROM heel slide 107 AROM heel slide 108 AROM heel slide 108 AROM supine heel slide 108 AROM supine heel slide  Knee extension 0 AROM seated heel slide       0 0  Ankle dorsiflexion           Ankle plantarflexion           Ankle inversion           Ankle eversion            (Blank rows = not tested)  LOWER EXTREMITY MMT:    MMT Right 04/28/2023 Left 04/28/2023 Right 06/02/2023 Left 06/02/2023 Right 07/08/2023 Left 07/08/2023  Hip flexion 5/5 5/5  Hip extension        Hip abduction        Hip adduction        Hip internal rotation        Hip external rotation        Knee flexion 5/5 5/5      Knee extension 5/5 38.9, 41 lbs 4+/5 28, 24.3 lbs 5/5 42, 41.4 lbs 5/5 35, 31.3 lbs 5/5 44.5, 44 lbs 5/5 40, 41 lbs  Ankle dorsiflexion 5/5 5/5      Ankle plantarflexion        Ankle inversion        Ankle eversion         (Blank rows = not  tested)   SPECIAL TESTS:  04/28/2023 No specific testing  FUNCTIONAL TESTS:  07/08/2023:  18 inch chair transfer s UE assist but pain in knees noted.   04/28/2023 18 inch chair transfer s UE assist but pain in knees noted.  Lt SLS 8 seconds Rt SLS 10 seconds  GAIT: 04/28/2023 Reduced TKE in stance on Lt leg with trendelenburg gait on Lt.                                                                                                                                                                                                                   TODAY'S TREATMENT:                                                                                                         DATE:  07/13/2023 Therex: UBE fwd LE ROM seat 7 10 mins lvl 3.5 Machine leg extension double leg up, single leg lowering 10 lbs 2 x 10 bilateral  Machine single leg curls 20 lbs 2 x 10  slowly , performed bilaterally  Seated knee overpressure stretch 15 sec x 2 bilateral Incline gastroc stretch 30 sec x 3 bilateral   Neuro Re-ed (to address neural sensitivity) Cross friction massage with pillow case on anterior distal thigh, proximal knee Education time given to explain neural sensitivity.  SLS with  contralateral leg tapping corners of black rectangle mat x 10 each, performed bilaterally   TherActivity (strength to to improve stairs, transfers, and other daily activity ) Forward step up 6 inch step with blue band TKE hold 2-3 sec hold x 20 bilateral   TODAY'S TREATMENT:                                                                                                         DATE:  07/08/2023 Therex: Recumbent bike seat 6 and then seat 5 for ROM x 10  Machine leg extension double leg up, single leg lowering 10 lbs 2 x 10 bilateral  Machine single leg curls 10 lbs x 15 slowly , performed bilaterally  Seated SLR x 15 slowly , performed bilaterally   Verbal review continued related to at home strengthening and machine  adjustment based off performance.   TherActivity (strength to to improve stairs, transfers, and other daily activity ) Leg press double leg 118 lbs x20,  single leg  x 20 62 lbs bilaterally    TODAY'S TREATMENT:                                                                                                         DATE:  07/06/2023 Therex: UBE UE/LE seat 9 for ROM/mobility 10 mins  lvl 1 Wall squat with ball x 10, squat hold in mid range x 3 to fatigue (15-30 seconds) Machine leg extension double leg up, single leg lowering 5 lbs 2 x 15 bilateral   Verbal cues to avoid long duration holds in bridges to match HEP performance to in clinic performance.    TherActivity (strength to to improve stairs, transfers, and other daily activity ) Leg press double leg 118 lbs x20,  single leg  x 20 62 lbs bilaterally  Step on over and down 4 inch step x 10 bilaterally    TODAY'S TREATMENT:                                                                                                         DATE:  07/02/2023 Therex: UBE UE/LE seat 7 for ROM/mobility 10 mins  lvl 3.0  Incline gastroc stretch 30  sec x 3  Standing lumbar extension AROM x 5  Supine it band stretch with band 15 sec x 3  Supine bridge c tband blue hip abduction hold 2 x 10 2-3 sec hold in lift Supine clam shell blue band x 20 c contralateral leg isometric hold, performed bilaterally Supine SK to opposite shoulder 15 sec x 3 bilateral    TherActivity (strength to to improve stairs, transfers, and other daily activity ) Leg press double leg 118 lbs x20,  single leg  x 20 62 lbs bilaterally   Manual Percussive device Lt lateral hip/thigh, Lt posterior hip for soft tissue mobilization/ trigger point release.  Continued education on use for home as she has gotten a device on her own.     PATIENT EDUCATION:  06/02/2023 Education details: HEP, POC Person educated: Patient Education method: Programmer, multimedia, Demonstration, Verbal cues, and  Handouts Education comprehension: verbalized understanding, returned demonstration, and verbal cues required  HOME EXERCISE PROGRAM: Access Code: [email or web address] URL: [email or web address] Date: 06/02/2023 Prepared by: Bonna Bustard  Exercises - Supine Lower Trunk Rotation  - 1-2 x daily - 7 x weekly - 1 sets - 3-5 reps - 15 hold - Supine Bridge  - 1-2 x daily - 7 x weekly - 1-2 sets - 10 reps - 2 hold - Supine Single Knee to Chest Stretch  - 1-2 x daily - 7 x weekly - 1 sets - 5 reps - 15 hold - Seated Quad Set  - 1-2 x daily - 7 x weekly - 1 sets - 10 reps - 5 hold - Seated Straight Leg Heel Taps  - 1-2 x daily - 7 x weekly - 3 sets - 10 reps - Sit to Stand  - 3 x daily - 7 x weekly - 1 sets - 10 reps - Standing Hip Hiking  - 1-2 x daily - 7 x weekly - 1 sets - 10 reps - 5 hold - Step Up  - 1 x daily - 4-5 x weekly - 2-3 sets - 10-15 reps - Lateral Step Up  - 1 x daily - 4-5 x weekly - 2-3 sets - 10-15 reps  ASSESSMENT:  CLINICAL IMPRESSION: Tenderness to clothing, light touch reported in distal thigh to proximal anterior knee.  Detailed, demonstrated and discussed tissue de sensitivity techniques with rubbing with soft materials.  Continued functional strengthening gains with focus on improving WB control.    OBJECTIVE IMPAIRMENTS: Abnormal gait, decreased activity tolerance, decreased balance, decreased coordination, decreased endurance, decreased mobility, difficulty walking, decreased ROM, decreased strength, hypomobility, increased fascial restrictions, impaired perceived functional ability, impaired flexibility, improper body mechanics, postural dysfunction, and pain.   ACTIVITY LIMITATIONS: carrying, lifting, bending, sitting, standing, squatting, sleeping, stairs, transfers, bed mobility, locomotion level, and caring for others  PARTICIPATION LIMITATIONS: meal prep, cleaning, laundry, interpersonal relationship, shopping, community activity, and occupation  PERSONAL  FACTORS: Time since onset of injury/illness/exacerbation are also affecting patient's functional outcome.   REHAB POTENTIAL: Good  CLINICAL DECISION MAKING: Stable/uncomplicated  EVALUATION COMPLEXITY: Low   GOALS: Goals reviewed with patient? Yes  SHORT TERM GOALS: (target date for Short term goals are 3 weeks 05/19/2023)  1. Patient will demonstrate independent use of home exercise program to maintain progress from in clinic treatments.  Goal status: met 05/19/2023  LONG TERM GOALS: (target dates for all long term goals are 8 weeks  09/02/2023 )   1. Patient will demonstrate/report pain at worst less than or equal to 2/10 to facilitate minimal limitation in daily activity  secondary to pain symptoms.  Goal status: on going 07/13/2023   2. Patient will demonstrate independent use of home exercise program to facilitate ability to maintain/progress functional gains from skilled physical therapy services.  Goal status: on going 07/13/2023     3. Patient will demonstrate FOTO outcome > or = 65 % to indicate reduced disability due to condition.  Goal status: on going 07/13/2023   4. Patient will demonstrate lumbar extension 100 % WFL s symptoms to facilitate upright standing, walking posture at PLOF s limitation.  Goal status: MET 06/23/23   5.  Patient will demonstrate bilateral knee extension MMT 5/5, dynamometry testing > 45 lbs to facilitate stairs, ambulation and other daily activity at PLOF.   Goal status: on going 07/13/2023   6.  Patient will demonstrate bilateral SLS > 15 seconds to improve stability in ambulation.  Goal status on going 07/13/2023   7.  Patient will demonstrate ascending/descending stairs reciprocally s UE assist for community integration.   Goal Status:on going 07/13/2023  PLAN:  PT FREQUENCY: 1-2x/week  PT DURATION: 8 weeks  PLANNED INTERVENTIONS: Can include 16109- PT Re-evaluation, 97110-Therapeutic exercises, 97530- Therapeutic activity, 97112-  Neuromuscular re-education, 97535- Self Care, 97140- Manual therapy, [phone]- Gait training, [phone]- Orthotic Fit/training, [phone]- Canalith repositioning, [id number]- Aquatic Therapy, [phone]- Electrical stimulation (unattended), 97750 Physical performance testing, [id number]- Electrical stimulation (manual), 97016- Vasopneumatic device, [id number]- Ultrasound, [id number]- Traction (mechanical), [id number]- Ionotophoresis 4mg /ml Dexamethasone, Patient/Family education, Balance training, Stair training, Taping, Dry Needling, Joint mobilization, Joint manipulation, Spinal manipulation, Spinal mobilization, Scar mobilization, Vestibular training, Visual/preceptual remediation/compensation, DME instructions, Cryotherapy, and Moist heat.  All performed as medically necessary.  All included unless contraindicated  PLAN FOR NEXT SESSION:  Recheck sensitivity reports for pants on Rt leg.   Bonna Bustard, PT, DPT, OCS, ATC 07/13/23  2:29 PM

## 2023-07-15 ENCOUNTER — Encounter: Admitting: Rehabilitative and Restorative Service Providers"

## 2023-07-16 ENCOUNTER — Ambulatory Visit (HOSPITAL_COMMUNITY)
Admission: RE | Admit: 2023-07-16 | Discharge: 2023-07-16 | Disposition: A | Discharge status: Home / Self Care | Payer: Self-pay | Source: Ambulatory Visit | Attending: Endocrinology | Admitting: Endocrinology

## 2023-07-16 DIAGNOSIS — E1165 Type 2 diabetes mellitus with hyperglycemia: Secondary | ICD-10-CM | POA: Insufficient documentation

## 2023-07-20 ENCOUNTER — Encounter: Payer: Self-pay | Admitting: Rehabilitative and Restorative Service Providers"

## 2023-07-20 ENCOUNTER — Ambulatory Visit (INDEPENDENT_AMBULATORY_CARE_PROVIDER_SITE_OTHER): Admitting: Rehabilitative and Restorative Service Providers"

## 2023-07-20 DIAGNOSIS — M5459 Other low back pain: Secondary | ICD-10-CM

## 2023-07-20 DIAGNOSIS — M79605 Pain in left leg: Secondary | ICD-10-CM

## 2023-07-20 DIAGNOSIS — R262 Difficulty in walking, not elsewhere classified: Secondary | ICD-10-CM

## 2023-07-20 DIAGNOSIS — M6281 Muscle weakness (generalized): Secondary | ICD-10-CM

## 2023-07-20 NOTE — Therapy (Signed)
 OUTPATIENT PHYSICAL THERAPY TREATMENT   Patient Name: Krystal Lang MRN: [id number] DOB:04-06-1955, 68 y.o., female Today's Date: 07/20/2023  END OF SESSION:  PT End of Session - 07/20/23 1401     Visit Number 16    Number of Visits 22    Date for PT Re-Evaluation 09/02/23    Authorization Type Medicare/ AET[employer]    Progress Note Due on Visit 20    PT Start Time 1344    PT Stop Time 1438    PT Time Calculation (min) 54 min    Activity Tolerance Patient tolerated treatment well    Behavior During Therapy WFL for tasks assessed/performed               Past Medical History:  Diagnosis Date   Anxiety    Arthritis    Cataract    Mixed form OU   Complication of anesthesia    after knee arthroscopy pateint difficult to wake up   Diabetes mellitus    Fibroid uterus 2001   H/O fatigue 1998   Headache(784.0)    denies   Hepatitis    A, 30 years ago   Hirsutism 1998   Idiopathic   History of chicken pox    Hypothyroidism    Menses, irregular 1998   Past Surgical History:  Procedure Laterality Date   COLONOSCOPY  02/2021   DILATION AND CURETTAGE OF UTERUS  03/31/2000   In [place]   KNEE ARTHROPLASTY Right    Replaced in [place], [place]   KNEE ARTHROSCOPY Right 2008   TONSILLECTOMY     As a child   TOTAL KNEE ARTHROPLASTY Left 08/21/2022   Procedure: LEFT TOTAL KNEE ARTHROPLASTY;  Surgeon: Arnie Lao, MD;  Location: [facility];  Service: Orthopedics;  Laterality: Left;   Patient Active Problem List   Diagnosis Date Noted   Status post total left knee replacement 08/21/2022   Dyspareunia, female 11/13/2011   Type 2 diabetes mellitus (HCC) 11/13/2011   Alopecia 11/13/2011   History of chicken pox    Headache    Hypothyroidism    Menses, irregular    Hirsutism    H/O fatigue    Fibroid uterus     PCP: Pasqual Bone MD  REFERRING PROVIDER: Diedra Fowler, MD  REFERRING DIAG: M54.50 (ICD-10-CM) - Low back pain, unspecified back pain laterality,  unspecified chronicity, unspecified whether sciatica present  Rationale for Evaluation and Treatment: Rehabilitation  THERAPY DIAG:  Other low back pain  Pain in left leg  Muscle weakness (generalized)  Difficulty in walking, not elsewhere classified  ONSET DATE: 07/2022  SUBJECTIVE:  SUBJECTIVE STATEMENT: Similar symptoms reported at times in bending and other daily activity for knees.    PERTINENT HISTORY:  PMH significant for anxiety, OA, DM, hypothyroidism, and Lt TKA 08/21/22.  Rt TKA  PAIN:  NPRS scale: mild to moderate reported today Pain location:knee at times Pain description: constant, achy Aggravating factors: standing, walking, stairs, sitting prolonged at times for back.  WB pressure Relieving factors: nothing specific   PRECAUTIONS: None  WEIGHT BEARING RESTRICTIONS: No  FALLS:  Has patient fallen in last 6 months? 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   PLOF: Independent, gym based activity  PATIENT GOALS: Get pain better, walk for exercise, go to gym  OBJECTIVE:   PATIENT SURVEYS:    06/23/23 FOTO updated 56%  05/28/2023: FOTO update:  67  04/28/2023 FOTO eval: 56   predicted:  65  SCREENING FOR RED FLAGS: 04/28/2023 Bowel or bladder incontinence: No Cauda equina syndrome: No  COGNITION: 04/28/2023 Overall cognitive status: WFL normal      SENSATION: 04/28/2023 Not tested  MUSCLE LENGTH: 04/28/2023 Not performed today.   POSTURE:  04/28/2023 Reduced lumbar lordosis in standing.   PALPATION: 04/28/2023 Tenderness noted in distal Lt quad, proximal incision. Glute max/med and side of thigh also tender on Lt side  LUMBAR ROM:   04/28/2023 Directional Preference Assessment: Centralization: Peripheralization:   AROM 04/28/2023 05/19/2023 06/23/23 07/02/2023 07/08/2023  Flexion To ankles no complaints   WNL    Extension 75% WFL c Rt lumbar pain noted (different than chief complaint)  100 % WFL WNL 100 % WFL 100 % WFL without pain   Right lateral flexion       Left lateral flexion       Right rotation To knee joint  WNL    Left rotation To lateral femoral epicondyle   WNL     (Blank rows = not tested)  LOWER EXTREMITY ROM:      Left 04/28/2023 Right 05/19/2023 Left 05/19/2023 Right 05/28/2023 Left 05/28/2023 Right 06/11/2023 Left 06/11/2023 Right 07/08/2023 Left 07/08/2023  Hip flexion           Hip extension           Hip abduction           Hip adduction           Hip internal rotation           Hip external rotation           Knee flexion 102 AROM in heel slide 100 AROM heel slide 105 AROM heel slide 107 AROM heel slide 109 AROM heel slide 107 AROM heel slide 108 AROM heel slide 108 AROM supine heel slide 108 AROM supine heel slide  Knee extension 0 AROM seated heel slide       0 0  Ankle dorsiflexion           Ankle plantarflexion           Ankle inversion           Ankle eversion            (Blank rows = not tested)  LOWER EXTREMITY MMT:    MMT Right 04/28/2023 Left 04/28/2023 Right 06/02/2023 Left 06/02/2023 Right 07/08/2023 Left 07/08/2023  Hip flexion 5/5 5/5      Hip extension        Hip abduction  Hip adduction        Hip internal rotation        Hip external rotation        Knee flexion 5/5 5/5      Knee extension 5/5 38.9, 41 lbs 4+/5 28, 24.3 lbs 5/5 42, 41.4 lbs 5/5 35, 31.3 lbs 5/5 44.5, 44 lbs 5/5 40, 41 lbs  Ankle dorsiflexion 5/5 5/5      Ankle plantarflexion        Ankle inversion        Ankle eversion         (Blank rows = not tested)   SPECIAL TESTS:  04/28/2023 No specific testing  FUNCTIONAL TESTS:  07/20/2023:  Lateral step down on Lt limited in control compared to Rt  with mild symptom noted on 4 inch step height.   07/08/2023:  18 inch chair transfer s UE assist but pain in knees noted.   04/28/2023 18 inch chair transfer s UE assist but pain in knees noted.  Lt SLS 8 seconds Rt SLS 10 seconds  GAIT: 04/28/2023 Reduced TKE in stance on Lt leg with trendelenburg gait on Lt.                                                                                                                                                                                                                   TODAY'S TREATMENT:                                                                                                         DATE:  07/20/2023 Therex: Recumbent bike lvl 1 for ROM, seat 6 with pillow behind back.  Seated quad set with SLR 2 x 10 bilateral with 2 sec pause in lift (detailed cues throughout for techniques) Machine leg extension double leg up, single leg lowering 10 lbs 2 x 15 bilateral  Machine single leg curls 20 lbs 2 x 10  slowly , performed bilaterally   Neuro Re-Ed (improve balance control, neuromuscular recruitment) Tandem ambulation on foam in // bars with occasional HHA  6 ft x 6 each way Seated quad set activation 5 sec hold x 10 bilateral (reminder cues for home use throughout day) SLS on foam with contralateral leg touching corners x 8 each, performed bilaterally   TherActivity (strength to to improve stairs, transfers, and other daily activity ) Leg press double leg 118 lbs x20,  single leg  2x 15 56 lbs bilaterally  Lateral step down eccentric control 2 x 10 4 inch step bilaterally    TODAY'S TREATMENT:                                                                                                         DATE:  07/13/2023 Therex: UBE fwd LE ROM seat 7 10 mins lvl 3.5 Machine leg extension double leg up, single leg lowering 10 lbs 2 x 10 bilateral  Machine single leg curls 20 lbs 2 x 10  slowly , performed bilaterally  Seated knee overpressure  stretch 15 sec x 2 bilateral Incline gastroc stretch 30 sec x 3 bilateral   Neuro Re-ed (to address neural sensitivity) Cross friction massage with pillow case on anterior distal thigh, proximal knee Education time given to explain neural sensitivity.  SLS with contralateral leg tapping corners of black rectangle mat x 10 each, performed bilaterally   TherActivity (strength to to improve stairs, transfers, and other daily activity ) Forward step up 6 inch step with blue band TKE hold 2-3 sec hold x 20 bilateral   TODAY'S TREATMENT:                                                                                                         DATE:  07/08/2023 Therex: Recumbent bike seat 6 and then seat 5 for ROM x 10  Machine leg extension double leg up, single leg lowering 10 lbs 2 x 10 bilateral  Machine single leg curls 10 lbs x 15 slowly , performed bilaterally  Seated SLR x 15 slowly , performed bilaterally   Verbal review continued related to at home strengthening and machine adjustment based off performance.   TherActivity (strength to to improve stairs, transfers, and other daily activity ) Leg press double leg 118 lbs x20,  single leg  x 20 62 lbs bilaterally    TODAY'S TREATMENT:  DATE:  07/06/2023 Therex: UBE UE/LE seat 9 for ROM/mobility 10 mins  lvl 1 Wall squat with ball x 10, squat hold in mid range x 3 to fatigue (15-30 seconds) Machine leg extension double leg up, single leg lowering 5 lbs 2 x 15 bilateral   Verbal cues to avoid long duration holds in bridges to match HEP performance to in clinic performance.    TherActivity (strength to to improve stairs, transfers, and other daily activity ) Leg press double leg 118 lbs x20,  single leg  x 20 62 lbs bilaterally  Step on over and down 4 inch step x 10 bilaterally    TODAY'S TREATMENT:                                                                                                          DATE:  07/02/2023 Therex: UBE UE/LE seat 7 for ROM/mobility 10 mins  lvl 3.0  Incline gastroc stretch 30 sec x 3  Standing lumbar extension AROM x 5  Supine it band stretch with band 15 sec x 3  Supine bridge c tband blue hip abduction hold 2 x 10 2-3 sec hold in lift Supine clam shell blue band x 20 c contralateral leg isometric hold, performed bilaterally Supine SK to opposite shoulder 15 sec x 3 bilateral    TherActivity (strength to to improve stairs, transfers, and other daily activity ) Leg press double leg 118 lbs x20,  single leg  x 20 62 lbs bilaterally   Manual Percussive device Lt lateral hip/thigh, Lt posterior hip for soft tissue mobilization/ trigger point release.  Continued education on use for home as she has gotten a device on her own.     PATIENT EDUCATION:  06/02/2023 Education details: HEP, POC Person educated: Patient Education method: Programmer, multimedia, Demonstration, Verbal cues, and Handouts Education comprehension: verbalized understanding, returned demonstration, and verbal cues required  HOME EXERCISE PROGRAM: Access Code: [email or web address] URL: [email or web address] Date: 06/02/2023 Prepared by: Bonna Bustard  Exercises - Supine Lower Trunk Rotation  - 1-2 x daily - 7 x weekly - 1 sets - 3-5 reps - 15 hold - Supine Bridge  - 1-2 x daily - 7 x weekly - 1-2 sets - 10 reps - 2 hold - Supine Single Knee to Chest Stretch  - 1-2 x daily - 7 x weekly - 1 sets - 5 reps - 15 hold - Seated Quad Set  - 1-2 x daily - 7 x weekly - 1 sets - 10 reps - 5 hold - Seated Straight Leg Heel Taps  - 1-2 x daily - 7 x weekly - 3 sets - 10 reps - Sit to Stand  - 3 x daily - 7 x weekly - 1 sets - 10 reps - Standing Hip Hiking  - 1-2 x daily - 7 x weekly - 1 sets - 10 reps - 5 hold - Step Up  - 1 x daily - 4-5 x weekly - 2-3 sets - 10-15 reps - Lateral Step Up  - 1 x daily - 4-5 x weekly -  2-3 sets - 10-15  reps  ASSESSMENT:  CLINICAL IMPRESSION: Pt to benefit from continued quad strengthening and flexion mobility progression as able to facilitate improved confidence and loading acceptance in WB.  Lt quad activation/definition still less than Rt at this time.   OBJECTIVE IMPAIRMENTS: Abnormal gait, decreased activity tolerance, decreased balance, decreased coordination, decreased endurance, decreased mobility, difficulty walking, decreased ROM, decreased strength, hypomobility, increased fascial restrictions, impaired perceived functional ability, impaired flexibility, improper body mechanics, postural dysfunction, and pain.   ACTIVITY LIMITATIONS: carrying, lifting, bending, sitting, standing, squatting, sleeping, stairs, transfers, bed mobility, locomotion level, and caring for others  PARTICIPATION LIMITATIONS: meal prep, cleaning, laundry, interpersonal relationship, shopping, community activity, and occupation  PERSONAL FACTORS: Time since onset of injury/illness/exacerbation are also affecting patient's functional outcome.   REHAB POTENTIAL: Good  CLINICAL DECISION MAKING: Stable/uncomplicated  EVALUATION COMPLEXITY: Low   GOALS: Goals reviewed with patient? Yes  SHORT TERM GOALS: (target date for Short term goals are 3 weeks 05/19/2023)  1. Patient will demonstrate independent use of home exercise program to maintain progress from in clinic treatments.  Goal status: met 05/19/2023  LONG TERM GOALS: (target dates for all long term goals are 8 weeks  09/02/2023 )   1. Patient will demonstrate/report pain at worst less than or equal to 2/10 to facilitate minimal limitation in daily activity secondary to pain symptoms.  Goal status: on going 07/13/2023   2. Patient will demonstrate independent use of home exercise program to facilitate ability to maintain/progress functional gains from skilled physical therapy services.  Goal status: on going 07/13/2023     3. Patient will  demonstrate FOTO outcome > or = 65 % to indicate reduced disability due to condition.  Goal status: on going 07/13/2023   4. Patient will demonstrate lumbar extension 100 % WFL s symptoms to facilitate upright standing, walking posture at PLOF s limitation.  Goal status: MET 06/23/23   5.  Patient will demonstrate bilateral knee extension MMT 5/5, dynamometry testing > 45 lbs to facilitate stairs, ambulation and other daily activity at PLOF.   Goal status: on going 07/13/2023   6.  Patient will demonstrate bilateral SLS > 15 seconds to improve stability in ambulation.  Goal status on going 07/13/2023   7.  Patient will demonstrate ascending/descending stairs reciprocally s UE assist for community integration.   Goal Status:on going 07/13/2023  PLAN:  PT FREQUENCY: 1-2x/week  PT DURATION: 8 weeks  PLANNED INTERVENTIONS: Can include 91478- PT Re-evaluation, 97110-Therapeutic exercises, 97530- Therapeutic activity, 97112- Neuromuscular re-education, 97535- Self Care, 97140- Manual therapy, [phone]- Gait training, [phone]- Orthotic Fit/training, [phone]- Canalith repositioning, [id number]- Aquatic Therapy, [phone]- Electrical stimulation (unattended), 97750 Physical performance testing, [id number]- Electrical stimulation (manual), 97016- Vasopneumatic device, [id number]- Ultrasound, [id number]- Traction (mechanical), [id number]- Ionotophoresis 4mg /ml Dexamethasone , Patient/Family education, Balance training, Stair training, Taping, Dry Needling, Joint mobilization, Joint manipulation, Spinal manipulation, Spinal mobilization, Scar mobilization, Vestibular training, Visual/preceptual remediation/compensation, DME instructions, Cryotherapy, and Moist heat.  All performed as medically necessary.  All included unless contraindicated  PLAN FOR NEXT SESSION:  Continue progressive strengthening as able.   Bonna Bustard, PT, DPT, OCS, ATC 07/20/23  2:40 PM

## 2023-07-22 ENCOUNTER — Encounter: Admitting: Rehabilitative and Restorative Service Providers"

## 2023-07-31 ENCOUNTER — Encounter: Payer: Self-pay | Admitting: Rehabilitative and Restorative Service Providers"

## 2023-07-31 ENCOUNTER — Ambulatory Visit (INDEPENDENT_AMBULATORY_CARE_PROVIDER_SITE_OTHER): Admitting: Rehabilitative and Restorative Service Providers"

## 2023-07-31 DIAGNOSIS — M6281 Muscle weakness (generalized): Secondary | ICD-10-CM | POA: Diagnosis not present

## 2023-07-31 DIAGNOSIS — M79605 Pain in left leg: Secondary | ICD-10-CM | POA: Diagnosis not present

## 2023-07-31 DIAGNOSIS — R262 Difficulty in walking, not elsewhere classified: Secondary | ICD-10-CM

## 2023-07-31 DIAGNOSIS — M5459 Other low back pain: Secondary | ICD-10-CM | POA: Diagnosis not present

## 2023-07-31 NOTE — Therapy (Signed)
 OUTPATIENT PHYSICAL THERAPY TREATMENT   Patient Name: SOBIA NORLING MRN: [id number] DOB:Mar 18, 1956, 68 y.o., female Today's Date: 07/31/2023  END OF SESSION:  PT End of Session - 07/31/23 1124     Visit Number 17    Number of Visits 22    Date for PT Re-Evaluation 09/02/23    Authorization Type Medicare/ [employer]ET[employer]    Progress Note Due on Visit 20    PT Start Time 1101    PT Stop Time 1140    PT Time Calculation (min) 39 min    Activity Tolerance Patient tolerated treatment well    Behavior During Therapy WFL for tasks assessed/performed                Past Medical History:  Diagnosis Date   Anxiety    Arthritis    Cataract    Mixed form OU   Complication of anesthesia    after knee arthroscopy pateint difficult to wake up   Diabetes mellitus    Fibroid uterus 2001   H/O fatigue 1998   Headache(784.0)    denies   Hepatitis    A, 30 years ago   Hirsutism 1998   Idiopathic   History of chicken pox    Hypothyroidism    Menses, irregular 1998   Past Surgical History:  Procedure Laterality Date   COLONOSCOPY  02/2021   DILATION AND CURETTAGE OF UTERUS  03/31/2000   In [place]   KNEE ARTHROPLASTY Right    Replaced in [place], [place]   KNEE ARTHROSCOPY Right 2008   TONSILLECTOMY     As a child   TOTAL KNEE ARTHROPLASTY Left 08/21/2022   Procedure: LEFT TOTAL KNEE ARTHROPLASTY;  Surgeon: Arnie Lao, MD;  Location: [facility];  Service: Orthopedics;  Laterality: Left;   Patient Active Problem List   Diagnosis Date Noted   Status post total left knee replacement 08/21/2022   Dyspareunia, female 11/13/2011   Type 2 diabetes mellitus (HCC) 11/13/2011   Alopecia 11/13/2011   History of chicken pox    Headache    Hypothyroidism    Menses, irregular    Hirsutism    H/O fatigue    Fibroid uterus     PCP: Pasqual Bone MD  REFERRING PROVIDER: Diedra Fowler, MD  REFERRING DIAG: M54.50 (ICD-10-CM) - Low back pain, unspecified back pain laterality,  unspecified chronicity, unspecified whether sciatica present  Rationale for Evaluation and Treatment: Rehabilitation  THERAPY DIAG:  Other low back pain  Pain in left leg  Muscle weakness (generalized)  Difficulty in walking, not elsewhere classified  ONSET DATE: 07/2022  SUBJECTIVE:  SUBJECTIVE STATEMENT: Pt indicated having some complaints in Lt posterior hip/back.  Pt indicated difficulty with going up stairs, hills.     PERTINENT HISTORY:  PMH significant for anxiety, OA, DM, hypothyroidism, and Lt TKA 08/21/22.  Rt TKA  PAIN:  NPRS scale: up to 5/10 Pain location:knee at times Pain description: constant, achy Aggravating factors: standing, walking, stairs, sitting prolonged at times for back.  WB pressure Relieving factors: nothing specific   PRECAUTIONS: None  WEIGHT BEARING RESTRICTIONS: No  FALLS:  Has patient fallen in last 6 months? 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   PLOF: Independent, gym based activity  PATIENT GOALS: Get pain better, walk for exercise, go to gym  OBJECTIVE:   PATIENT SURVEYS:    06/23/23 FOTO updated 56%  05/28/2023: FOTO update:  67  04/28/2023 FOTO eval: 56   predicted:  65  SCREENING FOR RED FLAGS: 04/28/2023 Bowel or bladder incontinence: No Cauda equina syndrome: No  COGNITION: 04/28/2023 Overall cognitive status: WFL normal      SENSATION: 04/28/2023 Not tested  MUSCLE LENGTH: 04/28/2023 Not performed today.   POSTURE:  04/28/2023 Reduced lumbar lordosis in standing.   PALPATION: 04/28/2023 Tenderness noted in distal Lt quad, proximal incision. Glute max/med and side of thigh also tender on Lt  side  LUMBAR ROM:  04/28/2023 Directional Preference Assessment: Centralization: Peripheralization:   AROM 04/28/2023 05/19/2023 06/23/23 07/02/2023 07/08/2023 07/31/2023  Flexion To ankles no complaints   WNL     Extension 75% WFL c Rt lumbar pain noted (different than chief complaint)  100 % WFL WNL 100 % WFL 100 % WFL without pain  100 % WFL  Right lateral flexion        Left lateral flexion        Right rotation To knee joint  WNL     Left rotation To lateral femoral epicondyle   WNL      (Blank rows = not tested)  LOWER EXTREMITY ROM:      Left 04/28/2023 Right 05/19/2023 Left 05/19/2023 Right 05/28/2023 Left 05/28/2023 Right 06/11/2023 Left 06/11/2023 Right 07/08/2023 Left 07/08/2023  Hip flexion           Hip extension           Hip abduction           Hip adduction           Hip internal rotation           Hip external rotation           Knee flexion 102 AROM in heel slide 100 AROM heel slide 105 AROM heel slide 107 AROM heel slide 109 AROM heel slide 107 AROM heel slide 108 AROM heel slide 108 AROM supine heel slide 108 AROM supine heel slide  Knee extension 0 AROM seated heel slide       0 0  Ankle dorsiflexion           Ankle plantarflexion           Ankle inversion           Ankle eversion            (Blank rows = not tested)  LOWER EXTREMITY MMT:    MMT Right 04/28/2023 Left 04/28/2023 Right 06/02/2023 Left 06/02/2023 Right 07/08/2023 Left 07/08/2023  Hip flexion 5/5 5/5      Hip extension  Hip abduction        Hip adduction        Hip internal rotation        Hip external rotation        Knee flexion 5/5 5/5      Knee extension 5/5 38.9, 41 lbs 4+/5 28, 24.3 lbs 5/5 42, 41.4 lbs 5/5 35, 31.3 lbs 5/5 44.5, 44 lbs 5/5 40, 41 lbs  Ankle dorsiflexion 5/5 5/5      Ankle plantarflexion        Ankle inversion        Ankle eversion         (Blank rows = not tested)   SPECIAL TESTS:  04/28/2023 No specific testing  FUNCTIONAL TESTS:  07/20/2023:  Lateral step  down on Lt limited in control compared to Rt with mild symptom noted on 4 inch step height.   07/08/2023:  18 inch chair transfer s UE assist but pain in knees noted.   04/28/2023 18 inch chair transfer s UE assist but pain in knees noted.  Lt SLS 8 seconds Rt SLS 10 seconds  GAIT: 04/28/2023 Reduced TKE in stance on Lt leg with trendelenburg gait on Lt.                                                                                                                                                                                                                   TODAY'S TREATMENT:                                                                                                         DATE:  07/31/2023 Therex: UBE UE/LE for ROM 10 mins lvl 2.5  Extended time spent in review of HEP per Pt request Standing lumbar extension AROM x 5 Supine lumbar trunk rotation stretch 15 sec x 3  Supine SKC 15 sec x 3 bilateral  Clam shells Lt 2 x 15   TODAY'S TREATMENT:  DATE:  07/20/2023 Therex: Recumbent bike lvl 1 for ROM, seat 6 with pillow behind back.  Seated quad set with SLR 2 x 10 bilateral with 2 sec pause in lift (detailed cues throughout for techniques) Machine leg extension double leg up, single leg lowering 10 lbs 2 x 15 bilateral  Machine single leg curls 20 lbs 2 x 10  slowly , performed bilaterally   Neuro Re-Ed (improve balance control, neuromuscular recruitment) Tandem ambulation on foam in // bars with occasional HHA 6 ft x 6 each way Seated quad set activation 5 sec hold x 10 bilateral (reminder cues for home use throughout day) SLS on foam with contralateral leg touching corners x 8 each, performed bilaterally   TherActivity (strength to to improve stairs, transfers, and other daily activity ) Leg press double leg 118 lbs x20,  single leg  2x 15 56 lbs bilaterally  Lateral step down  eccentric control 2 x 10 4 inch step bilaterally    TODAY'S TREATMENT:                                                                                                         DATE:  07/13/2023 Therex: UBE fwd LE ROM seat 7 10 mins lvl 3.5 Machine leg extension double leg up, single leg lowering 10 lbs 2 x 10 bilateral  Machine single leg curls 20 lbs 2 x 10  slowly , performed bilaterally  Seated knee overpressure stretch 15 sec x 2 bilateral Incline gastroc stretch 30 sec x 3 bilateral   Neuro Re-ed (to address neural sensitivity) Cross friction massage with pillow case on anterior distal thigh, proximal knee Education time given to explain neural sensitivity.  SLS with contralateral leg tapping corners of black rectangle mat x 10 each, performed bilaterally   TherActivity (strength to to improve stairs, transfers, and other daily activity ) Forward step up 6 inch step with blue band TKE hold 2-3 sec hold x 20 bilateral   TODAY'S TREATMENT:                                                                                                         DATE:  07/08/2023 Therex: Recumbent bike seat 6 and then seat 5 for ROM x 10  Machine leg extension double leg up, single leg lowering 10 lbs 2 x 10 bilateral  Machine single leg curls 10 lbs x 15 slowly , performed bilaterally  Seated SLR x 15 slowly , performed bilaterally   Verbal review continued related to at home strengthening and machine adjustment based off performance.   TherActivity (strength to to improve stairs, transfers, and other  daily activity ) Leg press double leg 118 lbs x20,  single leg  x 20 62 lbs bilaterally     PATIENT EDUCATION:  06/02/2023 Education details: HEP, POC Person educated: Patient Education method: Programmer, multimedia, Demonstration, Verbal cues, and Handouts Education comprehension: verbalized understanding, returned demonstration, and verbal cues required  HOME EXERCISE PROGRAM: Access Code: [email or web address] URL:  [email or web address] Date: 06/02/2023 Prepared by: Bonna Bustard  Exercises - Supine Lower Trunk Rotation  - 1-2 x daily - 7 x weekly - 1 sets - 3-5 reps - 15 hold - Supine Bridge  - 1-2 x daily - 7 x weekly - 1-2 sets - 10 reps - 2 hold - Supine Single Knee to Chest Stretch  - 1-2 x daily - 7 x weekly - 1 sets - 5 reps - 15 hold - Seated Quad Set  - 1-2 x daily - 7 x weekly - 1 sets - 10 reps - 5 hold - Seated Straight Leg Heel Taps  - 1-2 x daily - 7 x weekly - 3 sets - 10 reps - Sit to Stand  - 3 x daily - 7 x weekly - 1 sets - 10 reps - Standing Hip Hiking  - 1-2 x daily - 7 x weekly - 1 sets - 10 reps - 5 hold - Step Up  - 1 x daily - 4-5 x weekly - 2-3 sets - 10-15 reps - Lateral Step Up  - 1 x daily - 4-5 x weekly - 2-3 sets - 10-15 reps  ASSESSMENT:  CLINICAL IMPRESSION: Spent time today on review of HEP and techniques for addressing complaints from back/Lt hip/leg.  Good tolerance generally to activity.  Cues given to avoid pain increase in stretching.  Encouraged use of percussive device at home.    OBJECTIVE IMPAIRMENTS: Abnormal gait, decreased activity tolerance, decreased balance, decreased coordination, decreased endurance, decreased mobility, difficulty walking, decreased ROM, decreased strength, hypomobility, increased fascial restrictions, impaired perceived functional ability, impaired flexibility, improper body mechanics, postural dysfunction, and pain.   ACTIVITY LIMITATIONS: carrying, lifting, bending, sitting, standing, squatting, sleeping, stairs, transfers, bed mobility, locomotion level, and caring for others  PARTICIPATION LIMITATIONS: meal prep, cleaning, laundry, interpersonal relationship, shopping, community activity, and occupation  PERSONAL FACTORS: Time since onset of injury/illness/exacerbation are also affecting patient's functional outcome.   REHAB POTENTIAL: Good  CLINICAL DECISION MAKING: Stable/uncomplicated  EVALUATION  COMPLEXITY: Low   GOALS: Goals reviewed with patient? Yes  SHORT TERM GOALS: (target date for Short term goals are 3 weeks 05/19/2023)  1. Patient will demonstrate independent use of home exercise program to maintain progress from in clinic treatments.  Goal status: met 05/19/2023  LONG TERM GOALS: (target dates for all long term goals are 8 weeks  09/02/2023 )   1. Patient will demonstrate/report pain at worst less than or equal to 2/10 to facilitate minimal limitation in daily activity secondary to pain symptoms.  Goal status: on going 07/13/2023   2. Patient will demonstrate independent use of home exercise program to facilitate ability to maintain/progress functional gains from skilled physical therapy services.  Goal status: on going 07/13/2023     3. Patient will demonstrate FOTO outcome > or = 65 % to indicate reduced disability due to condition.  Goal status: on going 07/13/2023   4. Patient will demonstrate lumbar extension 100 % WFL s symptoms to facilitate upright standing, walking posture at PLOF s limitation.  Goal status: MET 06/23/23   5.  Patient will demonstrate bilateral knee extension MMT 5/5,  dynamometry testing > 45 lbs to facilitate stairs, ambulation and other daily activity at PLOF.   Goal status: on going 07/13/2023   6.  Patient will demonstrate bilateral SLS > 15 seconds to improve stability in ambulation.  Goal status on going 07/13/2023   7.  Patient will demonstrate ascending/descending stairs reciprocally s UE assist for community integration.   Goal Status:on going 07/13/2023  PLAN:  PT FREQUENCY: 1-2x/week  PT DURATION: 8 weeks  PLANNED INTERVENTIONS: Can include 40981- PT Re-evaluation, 97110-Therapeutic exercises, 97530- Therapeutic activity, 97112- Neuromuscular re-education, 97535- Self Care, 97140- Manual therapy, [phone]- Gait training, [phone]- Orthotic Fit/training, [phone]- Canalith repositioning, [id number]- Aquatic Therapy, [phone]- Electrical  stimulation (unattended), 97750 Physical performance testing, [id number]- Electrical stimulation (manual), 97016- Vasopneumatic device, [id number]- Ultrasound, [id number]- Traction (mechanical), [id number]- Ionotophoresis 4mg /ml Dexamethasone , Patient/Family education, Balance training, Stair training, Taping, Dry Needling, Joint mobilization, Joint manipulation, Spinal manipulation, Spinal mobilization, Scar mobilization, Vestibular training, Visual/preceptual remediation/compensation, DME instructions, Cryotherapy, and Moist heat.  All performed as medically necessary.  All included unless contraindicated  PLAN FOR NEXT SESSION:  Continue progressive strengthening as able.   Bonna Bustard, PT, DPT, OCS, ATC 07/31/23  11:37 AM

## 2023-08-07 ENCOUNTER — Ambulatory Visit (INDEPENDENT_AMBULATORY_CARE_PROVIDER_SITE_OTHER): Admitting: Rehabilitative and Restorative Service Providers"

## 2023-08-07 ENCOUNTER — Encounter: Payer: Self-pay | Admitting: Rehabilitative and Restorative Service Providers"

## 2023-08-07 DIAGNOSIS — M79605 Pain in left leg: Secondary | ICD-10-CM

## 2023-08-07 DIAGNOSIS — M6281 Muscle weakness (generalized): Secondary | ICD-10-CM | POA: Diagnosis not present

## 2023-08-07 DIAGNOSIS — R262 Difficulty in walking, not elsewhere classified: Secondary | ICD-10-CM

## 2023-08-07 DIAGNOSIS — M5459 Other low back pain: Secondary | ICD-10-CM

## 2023-08-07 NOTE — Therapy (Addendum)
 OUTPATIENT PHYSICAL THERAPY TREATMENT / DISCHARGE  Patient Name: Krystal Lang MRN: [id number] DOB:July 18, 1955, 68 y.o., female Today's Date: 08/07/2023  END OF SESSION:  PT End of Session - 08/07/23 1133     Visit Number 18    Number of Visits 22    Date for PT Re-Evaluation 09/02/23    Authorization Type Medicare/ AETNA    Progress Note Due on Visit 20    PT Start Time 1058    PT Stop Time 1138    PT Time Calculation (min) 40 min    Activity Tolerance Patient tolerated treatment well    Behavior During Therapy WFL for tasks assessed/performed                 Past Medical History:  Diagnosis Date   Anxiety    Arthritis    Cataract    Mixed form OU   Complication of anesthesia    after knee arthroscopy pateint difficult to wake up   Diabetes mellitus    Fibroid uterus 2001   H/O fatigue 1998   Headache(784.0)    denies   Hepatitis    A, 30 years ago   Hirsutism 1998   Idiopathic   History of chicken pox    Hypothyroidism    Menses, irregular 1998   Past Surgical History:  Procedure Laterality Date   COLONOSCOPY  02/2021   DILATION AND CURETTAGE OF UTERUS  03/31/2000   In [place]   KNEE ARTHROPLASTY Right    Replaced in [place], [place]   KNEE ARTHROSCOPY Right 2008   TONSILLECTOMY     As a child   TOTAL KNEE ARTHROPLASTY Left 08/21/2022   Procedure: LEFT TOTAL KNEE ARTHROPLASTY;  Surgeon: Vernetta Lonni GRADE, MD;  Location: [facility];  Service: Orthopedics;  Laterality: Left;   Patient Active Problem List   Diagnosis Date Noted   Status post total left knee replacement 08/21/2022   Dyspareunia, female 11/13/2011   Type 2 diabetes mellitus (HCC) 11/13/2011   Alopecia 11/13/2011   History of chicken pox    Headache    Hypothyroidism    Menses, irregular    Hirsutism    H/O fatigue    Fibroid uterus     PCP: Negri Pacific MD  REFERRING PROVIDER: Georgina Ozell DELENA, MD  REFERRING DIAG: M54.50 (ICD-10-CM) - Low back pain, unspecified back pain  laterality, unspecified chronicity, unspecified whether sciatica present  Rationale for Evaluation and Treatment: Rehabilitation  THERAPY DIAG:  Other low back pain  Pain in left leg  Muscle weakness (generalized)  Difficulty in walking, not elsewhere classified  ONSET DATE: 07/2022  SUBJECTIVE:  SUBJECTIVE STATEMENT: Pt indicated having some complaints in back of knee.     PERTINENT HISTORY:  PMH significant for anxiety, OA, DM, hypothyroidism, and Lt TKA 08/21/22.  Rt TKA  PAIN:  NPRS scale: up to 5/10 Pain location:knee at times Pain description: constant, achy Aggravating factors: standing, walking, stairs, sitting prolonged at times for back.  WB pressure Relieving factors: nothing specific   PRECAUTIONS: None  WEIGHT BEARING RESTRICTIONS: No  FALLS:  Has patient fallen in last 6 months? 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   PLOF: Independent, gym based activity  PATIENT GOALS: Get pain better, walk for exercise, go to gym  OBJECTIVE:   PATIENT SURVEYS:    06/23/23 FOTO updated 56%  05/28/2023: FOTO update:  67  04/28/2023 FOTO eval: 56   predicted:  65  SCREENING FOR RED FLAGS: 04/28/2023 Bowel or bladder incontinence: No Cauda equina syndrome: No  COGNITION: 04/28/2023 Overall cognitive status: WFL normal      SENSATION: 04/28/2023 Not tested  MUSCLE LENGTH: 04/28/2023 Not performed today.   POSTURE:  04/28/2023 Reduced lumbar lordosis in standing.   PALPATION: 04/28/2023 Tenderness noted in distal Lt quad, proximal incision. Glute max/med and side of thigh also tender on Lt side  LUMBAR ROM:  04/28/2023 Directional Preference  Assessment: Centralization: Peripheralization:   AROM 04/28/2023 05/19/2023 06/23/23 07/02/2023 07/08/2023 07/31/2023 08/07/2023  Flexion To ankles no complaints   WNL      Extension 75% WFL c Rt lumbar pain noted (different than chief complaint)  100 % WFL WNL 100 % WFL 100 % WFL without pain  100 % WFL 100% WFL  Right lateral flexion         Left lateral flexion         Right rotation To knee joint  WNL      Left rotation To lateral femoral epicondyle   WNL       (Blank rows = not tested)  LOWER EXTREMITY ROM:      Left 04/28/2023 Right 05/19/2023 Left 05/19/2023 Right 05/28/2023 Left 05/28/2023 Right 06/11/2023 Left 06/11/2023 Right 07/08/2023 Left 07/08/2023  Hip flexion           Hip extension           Hip abduction           Hip adduction           Hip internal rotation           Hip external rotation           Knee flexion 102 AROM in heel slide 100 AROM heel slide 105 AROM heel slide 107 AROM heel slide 109 AROM heel slide 107 AROM heel slide 108 AROM heel slide 108 AROM supine heel slide 108 AROM supine heel slide  Knee extension 0 AROM seated heel slide       0 0  Ankle dorsiflexion           Ankle plantarflexion           Ankle inversion           Ankle eversion            (Blank rows = not tested)  LOWER EXTREMITY MMT:    MMT Right 04/28/2023 Left 04/28/2023 Right 06/02/2023 Left 06/02/2023 Right 07/08/2023 Left 07/08/2023  Hip flexion 5/5 5/5      Hip extension  Hip abduction        Hip adduction        Hip internal rotation        Hip external rotation        Knee flexion 5/5 5/5      Knee extension 5/5 38.9, 41 lbs 4+/5 28, 24.3 lbs 5/5 42, 41.4 lbs 5/5 35, 31.3 lbs 5/5 44.5, 44 lbs 5/5 40, 41 lbs  Ankle dorsiflexion 5/5 5/5      Ankle plantarflexion        Ankle inversion        Ankle eversion         (Blank rows = not tested)   SPECIAL TESTS:  04/28/2023 No specific testing  FUNCTIONAL TESTS:  07/20/2023:  Lateral step down on Lt limited in control  compared to Rt with mild symptom noted on 4 inch step height.   07/08/2023:  18 inch chair transfer s UE assist but pain in knees noted.   04/28/2023 18 inch chair transfer s UE assist but pain in knees noted.  Lt SLS 8 seconds Rt SLS 10 seconds  GAIT: 04/28/2023 Reduced TKE in stance on Lt leg with trendelenburg gait on Lt.                                                                                                                                                                                                                   TODAY'S TREATMENT:                                                                                                         DATE:  08/07/2023 Therex: GRACEANN LE /UE for ROM 10 mins lvl 1.0   Machine leg extension double leg up, single leg lowering 10 lbs 2 x 15 bilateral  Additional time in verbal cues for HEP and continued strength and HEP for symptom relief.   TherActivity (strength to to improve stairs, transfers, and other daily activity ) Leg press double leg 118 lbs x20,  single leg  2x 15 56 lbs bilaterally  Manual Percussive device to Lt glute med/min/max , Lt lumbar, Lt lateral thigh.   TODAY'S TREATMENT:                                                                                                         DATE:  07/31/2023 Therex: UBE UE/LE for ROM 10 mins lvl 2.5  Extended time spent in review of HEP per Pt request Standing lumbar extension AROM x 5 Supine lumbar trunk rotation stretch 15 sec x 3  Supine SKC 15 sec x 3 bilateral  Clam shells Lt 2 x 15   TODAY'S TREATMENT:                                                                                                         DATE:  07/20/2023 Therex: Recumbent bike lvl 1 for ROM, seat 6 with pillow behind back.  Seated quad set with SLR 2 x 10 bilateral with 2 sec pause in lift (detailed cues throughout for techniques) Machine leg extension double leg up, single leg lowering 10 lbs 2 x 15 bilateral   Machine single leg curls 20 lbs 2 x 10  slowly , performed bilaterally   Neuro Re-Ed (improve balance control, neuromuscular recruitment) Tandem ambulation on foam in // bars with occasional HHA 6 ft x 6 each way Seated quad set activation 5 sec hold x 10 bilateral (reminder cues for home use throughout day) SLS on foam with contralateral leg touching corners x 8 each, performed bilaterally   TherActivity (strength to to improve stairs, transfers, and other daily activity ) Leg press double leg 118 lbs x20,  single leg  2x 15 56 lbs bilaterally  Lateral step down eccentric control 2 x 10 4 inch step bilaterally    TODAY'S TREATMENT:                                                                                                         DATE:  07/13/2023 Therex: UBE fwd LE ROM seat 7 10 mins lvl 3.5 Machine leg extension double leg up, single leg lowering 10 lbs 2 x 10 bilateral  Machine single leg curls 20 lbs 2 x 10  slowly , performed  bilaterally  Seated knee overpressure stretch 15 sec x 2 bilateral Incline gastroc stretch 30 sec x 3 bilateral   Neuro Re-ed (to address neural sensitivity) Cross friction massage with pillow case on anterior distal thigh, proximal knee Education time given to explain neural sensitivity.  SLS with contralateral leg tapping corners of black rectangle mat x 10 each, performed bilaterally   TherActivity (strength to to improve stairs, transfers, and other daily activity ) Forward step up 6 inch step with blue band TKE hold 2-3 sec hold x 20 bilateral   TODAY'S TREATMENT:                                                                                                         DATE:  07/08/2023 Therex: Recumbent bike seat 6 and then seat 5 for ROM x 10  Machine leg extension double leg up, single leg lowering 10 lbs 2 x 10 bilateral  Machine single leg curls 10 lbs x 15 slowly , performed bilaterally  Seated SLR x 15 slowly , performed bilaterally   Verbal  review continued related to at home strengthening and machine adjustment based off performance.   TherActivity (strength to to improve stairs, transfers, and other daily activity ) Leg press double leg 118 lbs x20,  single leg  x 20 62 lbs bilaterally     PATIENT EDUCATION:  06/02/2023 Education details: HEP, POC Person educated: Patient Education method: Programmer, multimedia, Demonstration, Verbal cues, and Handouts Education comprehension: verbalized understanding, returned demonstration, and verbal cues required  HOME EXERCISE PROGRAM: Access Code: [email or web address] URL: [email or web address] Date: 06/02/2023 Prepared by: Ozell Silvan  Exercises - Supine Lower Trunk Rotation  - 1-2 x daily - 7 x weekly - 1 sets - 3-5 reps - 15 hold - Supine Bridge  - 1-2 x daily - 7 x weekly - 1-2 sets - 10 reps - 2 hold - Supine Single Knee to Chest Stretch  - 1-2 x daily - 7 x weekly - 1 sets - 5 reps - 15 hold - Seated Quad Set  - 1-2 x daily - 7 x weekly - 1 sets - 10 reps - 5 hold - Seated Straight Leg Heel Taps  - 1-2 x daily - 7 x weekly - 3 sets - 10 reps - Sit to Stand  - 3 x daily - 7 x weekly - 1 sets - 10 reps - Standing Hip Hiking  - 1-2 x daily - 7 x weekly - 1 sets - 10 reps - 5 hold - Step Up  - 1 x daily - 4-5 x weekly - 2-3 sets - 10-15 reps - Lateral Step Up  - 1 x daily - 4-5 x weekly - 2-3 sets - 10-15 reps  ASSESSMENT:  CLINICAL IMPRESSION: At this time, Pt was in agreement with plan to trial HEP for continued strengthening and progress for back and leg symptoms.    OBJECTIVE IMPAIRMENTS: Abnormal gait, decreased activity tolerance, decreased balance, decreased coordination, decreased endurance, decreased mobility, difficulty walking, decreased ROM, decreased strength, hypomobility, increased fascial restrictions, impaired perceived functional  ability, impaired flexibility, improper body mechanics, postural dysfunction, and pain.   ACTIVITY LIMITATIONS: carrying, lifting,  bending, sitting, standing, squatting, sleeping, stairs, transfers, bed mobility, locomotion level, and caring for others  PARTICIPATION LIMITATIONS: meal prep, cleaning, laundry, interpersonal relationship, shopping, community activity, and occupation  PERSONAL FACTORS: Time since onset of injury/illness/exacerbation are also affecting patient's functional outcome.   REHAB POTENTIAL: Good  CLINICAL DECISION MAKING: Stable/uncomplicated  EVALUATION COMPLEXITY: Low   GOALS: Goals reviewed with patient? Yes  SHORT TERM GOALS: (target date for Short term goals are 3 weeks 05/19/2023)  1. Patient will demonstrate independent use of home exercise program to maintain progress from in clinic treatments.  Goal status: met 05/19/2023  LONG TERM GOALS: (target dates for all long term goals are 8 weeks  09/02/2023 )   1. Patient will demonstrate/report pain at worst less than or equal to 2/10 to facilitate minimal limitation in daily activity secondary to pain symptoms.  Goal status: on going 07/13/2023   2. Patient will demonstrate independent use of home exercise program to facilitate ability to maintain/progress functional gains from skilled physical therapy services.  Goal status: on going 07/13/2023     3. Patient will demonstrate FOTO outcome > or = 65 % to indicate reduced disability due to condition.  Goal status: on going 07/13/2023   4. Patient will demonstrate lumbar extension 100 % WFL s symptoms to facilitate upright standing, walking posture at PLOF s limitation.  Goal status: MET 06/23/23   5.  Patient will demonstrate bilateral knee extension MMT 5/5, dynamometry testing > 45 lbs to facilitate stairs, ambulation and other daily activity at PLOF.   Goal status: on going 07/13/2023   6.  Patient will demonstrate bilateral SLS > 15 seconds to improve stability in ambulation.  Goal status on going 07/13/2023   7.  Patient will demonstrate ascending/descending stairs reciprocally  s UE assist for community integration.   Goal Status:on going 07/13/2023  PLAN:  PT FREQUENCY: 1-2x/week  PT DURATION: 8 weeks  PLANNED INTERVENTIONS: Can include 02853- PT Re-evaluation, 97110-Therapeutic exercises, 97530- Therapeutic activity, 97112- Neuromuscular re-education, 97535- Self Care, 97140- Manual therapy, [phone]- Gait training, [phone]- Orthotic Fit/training, [phone]- Canalith repositioning, [id number]- Aquatic Therapy, [phone]- Electrical stimulation (unattended), 97750 Physical performance testing, [id number]- Electrical stimulation (manual), 97016- Vasopneumatic device, [id number]- Ultrasound, [id number]- Traction (mechanical), [id number]- Ionotophoresis 4mg /ml Dexamethasone , Patient/Family education, Balance training, Stair training, Taping, Dry Needling, Joint mobilization, Joint manipulation, Spinal manipulation, Spinal mobilization, Scar mobilization, Vestibular training, Visual/preceptual remediation/compensation, DME instructions, Cryotherapy, and Moist heat.  All performed as medically necessary.  All included unless contraindicated  PLAN FOR NEXT SESSION:  HEP trial  Ozell Silvan, PT, DPT, OCS, ATC 08/07/23  11:34 AM   PHYSICAL THERAPY DISCHARGE SUMMARY  Visits from Start of Care: 18  Current functional level related to goals / functional outcomes: See note   Remaining deficits: See note   Education / Equipment: HEP  Patient goals were partially met. Patient is being discharged due to not returning since the last visit.  Ozell Silvan, PT, DPT, OCS, ATC 11/13/23  10:45 AM

## 2023-08-10 ENCOUNTER — Encounter: Admitting: Rehabilitative and Restorative Service Providers"

## 2023-08-17 ENCOUNTER — Encounter: Admitting: Rehabilitative and Restorative Service Providers"

## 2023-09-12 ENCOUNTER — Telehealth (HOSPITAL_COMMUNITY): Payer: Self-pay | Admitting: Emergency Medicine

## 2023-09-12 ENCOUNTER — Ambulatory Visit (HOSPITAL_COMMUNITY)
Admission: RE | Admit: 2023-09-12 | Discharge: 2023-09-12 | Disposition: A | Discharge status: Home / Self Care | Source: Ambulatory Visit | Attending: Emergency Medicine | Admitting: Emergency Medicine

## 2023-09-12 DIAGNOSIS — M79671 Pain in right foot: Secondary | ICD-10-CM | POA: Insufficient documentation

## 2023-09-12 NOTE — Telephone Encounter (Signed)
 Discussed with Dr. Sharyn Deforest. Will place XR right foot for evaluation of foot pain and to evaluate for foreign body.

## 2023-09-14 ENCOUNTER — Ambulatory Visit (INDEPENDENT_AMBULATORY_CARE_PROVIDER_SITE_OTHER): Admitting: Podiatry

## 2023-09-14 ENCOUNTER — Ambulatory Visit

## 2023-09-14 DIAGNOSIS — M795 Residual foreign body in soft tissue: Secondary | ICD-10-CM

## 2023-09-14 DIAGNOSIS — L853 Xerosis cutis: Secondary | ICD-10-CM | POA: Diagnosis not present

## 2023-09-14 DIAGNOSIS — M7751 Other enthesopathy of right foot: Secondary | ICD-10-CM

## 2023-09-14 NOTE — Progress Notes (Signed)
 Subjective:   Patient ID: Krystal Lang, female   DOB: 68 y.o.   MRN: [id number]   HPI Chief Complaint  Patient presents with   Foot Pain    Rm#12 Patient states she had been cleaning and had xray shows nothing  Hurts when walking.thinks she has something in her foot.   68 year old female presents the office today with concerns of possible residual foreign body.  She said that she was cleaning on Friday night and she thinks something went inside of her foot but she does not think it was glass.  She think it could be something like a crumb.  She states that she only gets the pain particularly with weightbearing, pressure.  Does not report any open lesions or any drainage.  She is diabetic which makes her concerned.   Review of Systems  All other systems reviewed and are negative.   Past Medical History:  Diagnosis Date   Anxiety    Arthritis    Cataract    Mixed form OU   Complication of anesthesia    after knee arthroscopy pateint difficult to wake up   Diabetes mellitus    Fibroid uterus 2001   H/O fatigue 1998   Headache(784.0)    denies   Hepatitis    A, 30 years ago   Hirsutism 1998   Idiopathic   History of chicken pox    Hypothyroidism    Menses, irregular 1998    Past Surgical History:  Procedure Laterality Date   COLONOSCOPY  02/2021   DILATION AND CURETTAGE OF UTERUS  03/31/2000   In [place]   KNEE ARTHROPLASTY Right    Replaced in [place], [place]   KNEE ARTHROSCOPY Right 2008   TONSILLECTOMY     As a child   TOTAL KNEE ARTHROPLASTY Left 08/21/2022   Procedure: LEFT TOTAL KNEE ARTHROPLASTY;  Surgeon: Vernetta Lonni GRADE, MD;  Location: [facility];  Service: Orthopedics;  Laterality: Left;     Current Outpatient Medications:    Ascorbic Acid (VITAMIN C PO), Take 1 tablet by mouth daily., Disp: , Rfl:    atorvastatin (LIPITOR) 20 MG tablet, Take 20 mg by mouth 3 (three) times a week., Disp: , Rfl:    celecoxib  (CELEBREX ) 200 MG capsule, Take 1 capsule (200 mg  total) by mouth 2 (two) times daily between meals as needed., Disp: 180 capsule, Rfl: 3   diazepam  (VALIUM ) 5 MG tablet, Take 1 tablet (5 mg total) by mouth 2 (two) times daily as needed for anxiety (dizziness). DO NOT take at the same time as your zolpidem  (take either or at night), Disp: 10 tablet, Rfl: 0   empagliflozin  (JARDIANCE ) 10 MG TABS tablet, Take 10 mg by mouth daily., Disp: , Rfl:    gabapentin  (NEURONTIN ) 300 MG capsule, Take 1 capsule (300 mg total) by mouth daily as needed., Disp: 30 capsule, Rfl: 0   glimepiride  (AMARYL ) 2 MG tablet, Take 2 mg by mouth in the morning and at bedtime., Disp: , Rfl:    levothyroxine  (SYNTHROID ) 100 MCG tablet, Take 100 mcg by mouth daily before breakfast., Disp: , Rfl:    lidocaine  (LIDODERM ) 5 %, Place 1 patch onto the skin daily. Remove & Discard patch within 12 hours or as directed by MD, Disp: 30 patch, Rfl: 0   meclizine  (ANTIVERT ) 25 MG tablet, Take 1 tablet (25 mg total) by mouth 3 (three) times daily as needed for dizziness or nausea., Disp: 30 tablet, Rfl: 0   methocarbamol  (ROBAXIN ) 500  MG tablet, Take 1 tablet (500 mg total) by mouth every 6 (six) hours as needed for muscle spasms., Disp: 60 tablet, Rfl: 0   oxyCODONE  (OXY IR/ROXICODONE ) 5 MG immediate release tablet, Take 1-2 tablets (5-10 mg total) by mouth every 4 (four) hours as needed for moderate pain (pain score 4-6)., Disp: 30 tablet, Rfl: 0   sitaGLIPtin -metformin  (JANUMET ) 50-1000 MG tablet, Take 1 tablet by mouth 2 (two) times daily with a meal., Disp: , Rfl:    Vitamin D , Ergocalciferol , (DRISDOL ) 1.25 MG (50000 UNIT) CAPS capsule, Take 50,000 Units by mouth every 7 (seven) days., Disp: , Rfl:    zolpidem  (AMBIEN ) 5 MG tablet, TAKE 1 TABLET BY MOUTH AT BEDTIME AS NEEDED FOR SLEEP., Disp: 10 tablet, Rfl: 0  No Known Allergies       Objective:  Physical Exam  General: AAO x3, NAD  Dermatological: Unable to appreciate any area of a puncture wound there is no open lesions  identified.  On the area where she has some discomfort upon weightbearing there is some dry skin present with skin fissures but there is no open lesion identified.  Once I debrided this is not able to identify any foreign object or puncture wound.  There is no edema, erythema or any signs of infection.  Vascular: Dorsalis Pedis artery and Posterior Tibial artery pedal pulses are 2/4 bilateral with immedate capillary fill time.  There is no pain with calf compression, swelling, warmth, erythema.   Neruologic: Grossly intact via light touch bilateral.   Musculoskeletal: Unable to appreciate any tenderness on the nonweightbearing exam.  She only gets tenderness with weightbearing.  I had her stand up and walk the area that was hurting.  There is no underlying ulceration drainage or foreign object identified.   Gait: Unassisted, Nonantalgic.        Assessment:   68 year old female with concern for residual foreign body     Plan:  -Treatment options discussed including all alternatives, risks, and complications -Etiology of symptoms were discussed -X-rays were obtained and reviewed with the patient.  Multiple views right foot were obtained.  There is no evidence of acute fracture.  No evidence of foreign object identified. -I debrided some hyperkeratotic tissue overlying the area, dry skin.  No foreign body identified.  Dispensed miracle foot cream for moisturizer as well as offloading pads. - Order ultrasound to rule out foreign object (faxed to [facility]).  - Daily foot inspection, glucose control.  Monitor for any signs or symptoms of infection.  Donnice JONELLE Fees DPM

## 2023-09-14 NOTE — Patient Instructions (Signed)

## 2023-09-16 ENCOUNTER — Telehealth: Payer: Self-pay | Admitting: Podiatry

## 2023-09-16 NOTE — Telephone Encounter (Signed)
 Patient is requesting a rush on referral for ultra sound due to her leaving to go out of town in a few days. Patient contact telephone number, [phone]

## 2023-09-17 ENCOUNTER — Ambulatory Visit: Admitting: Podiatry

## 2023-09-17 NOTE — Telephone Encounter (Signed)
 The patient states that no one has contacted her regarding her referral. I have updated her and provide where referral was sent, phone number and address.

## 2023-09-19 ENCOUNTER — Encounter: Payer: Self-pay | Admitting: Podiatry

## 2023-09-20 NOTE — Telephone Encounter (Signed)
 Can someone please assist her? Thanks!

## 2023-10-07 ENCOUNTER — Encounter: Payer: Self-pay | Admitting: Podiatry

## 2023-10-09 ENCOUNTER — Ambulatory Visit: Payer: Self-pay | Admitting: Podiatry

## 2023-10-22 ENCOUNTER — Ambulatory Visit (INDEPENDENT_AMBULATORY_CARE_PROVIDER_SITE_OTHER): Admitting: Podiatry

## 2023-10-22 ENCOUNTER — Encounter: Payer: Self-pay | Admitting: Podiatry

## 2023-10-22 DIAGNOSIS — M79671 Pain in right foot: Secondary | ICD-10-CM

## 2023-10-25 NOTE — Progress Notes (Signed)
 Subjective:   Patient ID: Krystal Lang, female   DOB: 68 y.o.   MRN: [id number]   HPI Chief Complaint  Patient presents with   Foot Pain    Rm11 Follow up right foot arch pain/ pain on and off when resting and walking.    68 year old female presents the office today for follow evaluation.  States that she gets intermittent discomfort and is not on any daily basis.  She does not see any open sores or any drainage.  She has a history of knee replacements that she gets worried about infection especially being diabetic.  She does not report any fevers or chills.  She states that her husband looked at the foot daily and does not see anything.   Review of Systems  All other systems reviewed and are negative.      Objective:  Physical Exam  General: AAO x3, NAD  Dermatological: There is 1 small hyperkeratotic lesion just proximal tarsal base but there is no underlying ulceration, foreign body or drainage or signs of infection.  This is not where she gets discomfort.  The majority of tenderness is localized under the fifth metatarsal base plantarly.  There is no pain laterally or dorsally.  There is no erythema or warmth.  There is some trace edema compared to the contralateral extremity.  There is no areas of fluctuation.  Vascular: Dorsalis Pedis artery and Posterior Tibial artery pedal pulses are 2/4 bilateral with immedate capillary fill time.  There is no pain with calf compression, swelling, warmth, erythema.   Neruologic: Grossly intact via light touch bilateral.   Musculoskeletal: Tenderness noted plantar aspect fifth metatarsal base.  No other areas of discomfort.  Gait: Unassisted, Nonantalgic.        Assessment:   68 year old female with concern for residual foreign body; foot pain     Plan:  -Treatment options discussed including all alternatives, risks, and complications -Etiology of symptoms were discussed -Reviewed the ultrasound.  Not able to appreciate any  foreign object, puncture wound or signs of infection.  She does have some slight edema she gets tenderness on the fifth metatarsal base.  Discussed icing exercises daily as well as topical medication such as Voltaren gel.  Return if symptoms worsen or fail to improve.  Donnice JONELLE Fees DPM

## 2023-12-24 ENCOUNTER — Ambulatory Visit: Admitting: Orthopaedic Surgery

## 2023-12-28 ENCOUNTER — Other Ambulatory Visit (INDEPENDENT_AMBULATORY_CARE_PROVIDER_SITE_OTHER): Payer: Self-pay

## 2023-12-28 ENCOUNTER — Encounter: Payer: Self-pay | Admitting: Orthopaedic Surgery

## 2023-12-28 ENCOUNTER — Other Ambulatory Visit: Payer: Self-pay

## 2023-12-28 ENCOUNTER — Ambulatory Visit (INDEPENDENT_AMBULATORY_CARE_PROVIDER_SITE_OTHER): Admitting: Orthopaedic Surgery

## 2023-12-28 DIAGNOSIS — Z96652 Presence of left artificial knee joint: Secondary | ICD-10-CM | POA: Diagnosis not present

## 2023-12-28 DIAGNOSIS — G8929 Other chronic pain: Secondary | ICD-10-CM

## 2023-12-28 DIAGNOSIS — M25561 Pain in right knee: Secondary | ICD-10-CM

## 2023-12-28 NOTE — Progress Notes (Signed)
 The patient is a very pleasant 68 year old female who is now 16 months out from a left cemented total knee arthroplasty and 2-1/2 years out from a right press-fit total knee arthroplasty.  The right knee was done in [place]  and we did the left knee.  She is been out of the state recently traveling by plane.  She is experiencing left knee pain but she points to lateral aspect of the left IT band and just distal to the knee replacement on the lateral side of her knee as a source of her pain.  She says it is hard to do stairs sometimes.  She reports some swelling with both knees and some back pain as well.  On my exam today neither knee has an effusion.  There is more pain over the IT band on the left knee than the right knee and both knees extend well and flex to about 100 degrees.  Both hips move smoothly and fluidly.  She walks without a significant limp.  She would like outpatient physical therapy for any modalities thing help decrease her IT band pain especially on the left side from the troches area down to the past the knee.  They can work on the right side as well with any modalities per the therapist discretion and then we will see her back in 6 weeks to see how she is doing overall but no x-rays are needed.

## 2024-01-14 NOTE — Therapy (Signed)
 OUTPATIENT PHYSICAL THERAPY EVALUATION   Patient Name: Krystal Lang MRN: [id number] DOB:25-May-1955, 68 y.o., female Today's Date: 01/15/2024  END OF SESSION:  PT End of Session - 01/15/24 1302     Visit Number 1    Number of Visits 20    Date for Recertification  03/25/24    Authorization Type Medicare    Progress Note Due on Visit 10    PT Start Time 1303    PT Stop Time 1347    PT Time Calculation (min) 44 min    Activity Tolerance Patient tolerated treatment well    Behavior During Therapy WFL for tasks assessed/performed          Past Medical History:  Diagnosis Date   Anxiety    Arthritis    Cataract    Mixed form OU   Complication of anesthesia    after knee arthroscopy pateint difficult to wake up   Diabetes mellitus    Fibroid uterus 2001   H/O fatigue 1998   Headache(784.0)    denies   Hepatitis    A, 30 years ago   Hirsutism 1998   Idiopathic   History of chicken pox    Hypothyroidism    Menses, irregular 1998   Past Surgical History:  Procedure Laterality Date   COLONOSCOPY  02/2021   DILATION AND CURETTAGE OF UTERUS  03/31/2000   In [place]   KNEE ARTHROPLASTY Right    Replaced in [place], [place]   KNEE ARTHROSCOPY Right 2008   TONSILLECTOMY     As a child   TOTAL KNEE ARTHROPLASTY Left 08/21/2022   Procedure: LEFT TOTAL KNEE ARTHROPLASTY;  Surgeon: Vernetta Lonni GRADE, MD;  Location: [facility];  Service: Orthopedics;  Laterality: Left;   Patient Active Problem List   Diagnosis Date Noted   Status post total left knee replacement 08/21/2022   Dyspareunia, female 11/13/2011   Type 2 diabetes mellitus (HCC) 11/13/2011   Alopecia 11/13/2011   History of chicken pox    Headache    Hypothyroidism    Menses, irregular    Hirsutism    H/O fatigue    Fibroid uterus     PCP: Negri Pacific, MD  REFERRING PROVIDER: Vernetta Lonni GRADE, MD  REFERRING DIAG: M54.50,G89.29 (ICD-10-CM) - Chronic left-sided low back pain without  sciatica  Rationale for Evaluation and Treatment: Rehabilitation  THERAPY DIAG:  Other low back pain  Pain in right leg  Pain in left leg  Muscle weakness (generalized)  Difficulty in walking, not elsewhere classified  ONSET DATE: 2024 chronic   SUBJECTIVE:  SUBJECTIVE STATEMENT: Pt indicated continued complaints for outside of Lt knee and also noted from Lt hip down leg to lateral lower leg.  Also reported complaints in posterior Lt knee.  Pt indicated some complaints on Rt but not as bad as Lt.   Pt indicated not continuing HEP as previous discussed.   Reported some difficulty with sleeping.    Reported walking 4 miles primarily as exercise at home.  Pt familiar to clinic for treatment in past for knee and back symptoms, most recently in early 2025.   PERTINENT HISTORY:  PMH significant for anxiety, OA, DM, hypothyroidism, and Lt TKA 08/21/22. Rt TKA   PAIN:  NPRS scale: at worst 6/10, at best 4/10 Pain location: Lt knee, lateral leg from hip to lower leg.  Pain description: jabbing, stiff Aggravating factors: going down hill walk/stairs,  transfers.  Relieving factors: rest   PRECAUTIONS: None  WEIGHT BEARING RESTRICTIONS: No  FALLS:  Has patient fallen in last 6 months? No  LIVING ENVIRONMENT: Lives in: House/apartment Stairs: flight of stairs to bedroom  OCCUPATION: Work with computer work, standing/walking.   PLOF: Independent, gym based activity, walking  PATIENT GOALS: Reduce pain   OBJECTIVE:   DIAGNOSTIC FINDINGS:  See chart   PATIENT SURVEYS:  Patient-Specific Activity Scoring Scheme  0 represents "unable to perform." 10 represents "able to perform at prior level. 0 1 2 3 4 5 6 7 8 9  10 (Date and Score)   Activity Eval  01/15/2024    1. Stairs   4    2.  transfers  7    3. Sleeping  7   4. Walking down hills  4   5.    Score 5.5    Total score = sum of the activity scores/number of activities Minimum detectable change (90%CI) for average score = 2 points Minimum detectable change (90%CI) for single activity score = 3 points  SCREENING FOR RED FLAGS: 01/15/2024 Bowel or bladder incontinence: No Cauda equina syndrome: No  COGNITION: 01/15/2024 Overall cognitive status: WFL normal      SENSATION: 01/15/2024 WFL  MUSCLE LENGTH: 01/15/2024 No specific testing in clinic today  POSTURE:  01/15/2024 Unremarkable for current condition.   PALPATION: 01/15/2024 Tenderness with trigger points noted in Lt glute med/min, lateral Lt quad, Lateral knee joint, distal IT band insertion.   LUMBAR ROM:  01/15/2024 Directional Preference Assessment: Centralization: not observed Peripheralization: not observed   AROM Eval 01/15/2024  Flexion To mid shin without complaint.   Extension 50% WFL with no change in complaints.   Right lateral flexion   Left lateral flexion   Right rotation   Left rotation    (Blank rows = not tested)  LOWER EXTREMITY ROM:      Right Eval 01/15/2024 Left Eval 01/15/2024  Hip flexion    Hip extension    Hip abduction    Hip adduction    Hip internal rotation    Hip external rotation    Knee flexion 108 AROM heel slide in supine 108 AROM heel slide in supine  Knee extension 0 0  Ankle dorsiflexion    Ankle plantarflexion    Ankle inversion    Ankle eversion     (Blank rows = not tested)  LOWER EXTREMITY MMT:    MMT Right Eval 01/15/2024 Left Eval 01/15/2024  Hip flexion 5/5 5/5  Hip extension    Hip abduction 4/5 4/5  Hip adduction    Hip internal rotation    Hip external  rotation    Knee flexion 5/5 5/5  Knee extension 5/5 45, 40.5 lbs 4+/5 37.4, 37.8 lbs  Ankle dorsiflexion 5/5 5/5  Ankle plantarflexion    Ankle inversion    Ankle eversion     (Blank rows = not  tested)  SPECIAL TESTS:  01/15/2024 (-) slump bilaterally.   FUNCTIONAL TESTS:  01/15/2024 18 inch chair transfer s UE assist on 1st try, pain noted.  Trendelenburg in SLS assessment, Lt > Rt.  Lt SLS:  approx. 5 seconds Rt SLS : < 3 seconds   GAIT: 01/15/2024 Independent ambulation                                                                                                                                                                                                                   TODAY'S TREATMENT:                                                                                                         DATE: 01/15/2024  Therex:    HEP instruction/performance c cues for techniques, handout provided.  Trial set performed of each for comprehension and symptom assessment.  See below for exercise list Additional time spent in review of handout and emphasis on routine use.    Manual Percussive device to Lt lateral hip glute med/min, Lt quad/lateral thigh.   PATIENT EDUCATION:  01/15/2024 Education details: HEP, POC Person educated: Patient Education method: Programmer, multimedia, Demonstration, Verbal cues, and Handouts Education comprehension: verbalized understanding, returned demonstration, and verbal cues required  HOME EXERCISE PROGRAM: Access Code: [email or web address] URL: [email or web address] Date: 01/15/2024 Prepared by: Ozell Silvan  Exercises - Standing Lumbar Extension  - 2-4 x daily - 7 x weekly - 1 sets - 5-10 reps - Supine Lower Trunk Rotation  - 1-2 x daily - 7 x weekly - 1 sets - 3-5 reps - 15 hold - Clamshell  - 1-2 x daily - 7 x weekly - 2-3 sets - 10-15 reps - Supine Bridge  - 1-2 x daily - 7 x weekly - 1-2 sets - 10 reps -  2 hold - Supine Single Knee to Chest Stretch  - 1-2 x daily - 7 x weekly - 1 sets - 5 reps - 15 hold - Seated Quad Set  - 1-2 x daily - 7 x weekly - 1 sets - 10 reps - 5 hold - Seated SLR  - 1-2 x daily - 7 x weekly - 1-2 sets - 10-15  reps - 2 hold - Standing Hip Hiking  - 1-2 x daily - 7 x weekly - 1 sets - 10 reps - 5 hold    ASSESSMENT:  CLINICAL IMPRESSION: Patient is a 68 y.o. who comes to clinic with complaints of pain both legs Lt > Rt pain with mobility, strength and movement coordination deficits that impair their ability to perform usual daily and recreational functional activities without increase difficulty/symptoms at this time.  Patient to benefit from skilled PT services to address impairments and limitations to improve to previous level of function without restriction secondary to condition.   OBJECTIVE IMPAIRMENTS: Abnormal gait, decreased activity tolerance, decreased balance, decreased coordination, decreased endurance, decreased mobility, difficulty walking, decreased ROM, decreased strength, increased fascial restrictions, impaired perceived functional ability, increased muscle spasms, impaired flexibility, improper body mechanics, and pain.   ACTIVITY LIMITATIONS: bending, sitting, standing, squatting, sleeping, stairs, transfers, bed mobility, and locomotion level  PARTICIPATION LIMITATIONS: meal prep, cleaning, laundry, interpersonal relationship, shopping, and community activity  PERSONAL FACTORS: Time since onset of injury/illness/exacerbation and PMH significant for anxiety, OA, DM, hypothyroidism, and Lt TKA 08/21/22.  Rt TKA are also affecting patient's functional outcome.   REHAB POTENTIAL: Good  CLINICAL DECISION MAKING: Evolving/moderate complexity  EVALUATION COMPLEXITY: Moderate   GOALS: Goals reviewed with patient? Yes  SHORT TERM GOALS: (target date for Short term goals are 3 weeks 02/15/2024)  1. Patient will demonstrate independent use of home exercise program to maintain progress from in clinic treatments.  Goal status: New  LONG TERM GOALS: (target dates for all long term goals are 10 weeks  03/25/2024 )   1. Patient will demonstrate/report pain at worst less than or equal  to 2/10 to facilitate minimal limitation in daily activity secondary to pain symptoms.  Goal status: New   2. Patient will demonstrate independent use of home exercise program to facilitate ability to maintain/progress functional gains from skilled physical therapy services.  Goal status: New   3. Patient will demonstrate Patient specific functional scale avg > or = 8/10 to indicate reduced disability due to condition.   Goal status: New   4. Patient will demonstrate lumbar extension 100 % WFL s symptoms to facilitate upright standing, walking posture at PLOF s limitation.  Goal status: New   5.  Patient will demonstrate bilateral hip MMT 5/5, knee extension dynamometry improvement > 15 lbs to facilitate stairs, incline/decline walking and other daily activity at PLOF.   Goal status: New   6.  Patient will demonstrate SLS bilaterally > 10 seconds to facilitate stability in ambulation.  Goal status: New   7.  Patient will demonstrate ascending/descending stairs reciprocally s UE assist for community integration.   Goal Status: New  PLAN:  PT FREQUENCY: 1-2x/week  PT DURATION: 10 weeks  PLANNED INTERVENTIONS: Can include 02853- PT Re-evaluation, 97110-Therapeutic exercises, 97530- Therapeutic activity, [id number]- Neuromuscular re-education, 97535- Self Care, [id number]- Manual therapy, [id number]- Gait training, [phone]- Orthotic Fit/training, [phone]- Canalith repositioning, [id number]- Aquatic Therapy, [phone]- Electrical stimulation (unattended), [id number] Physical performance testing, [id number]- Vasopneumatic device, [id number]- Ultrasound, [id number]- Traction (mechanical), [id number]- Ionotophoresis 4mg /ml Dexamethasone ,  [id number] -  Needle insertion w/o injection 1 or 2 muscles, 20561 - Needle insertion w/o injection 3 or more muscles.    Patient/Family education, Balance training, Stair training, Taping, Dry Needling, Joint mobilization, Joint manipulation, Spinal manipulation, Spinal mobilization, Scar mobilization,  Vestibular training, Visual/preceptual remediation/compensation, DME instructions, Cryotherapy, and Moist heat.  All performed as medically necessary.  All included unless contraindicated  PLAN FOR NEXT SESSION: Review HEP knowledge/results.  Needs continued strengthening for lateral/posterior hip, quads.  Percussive device as indicated/tolerated.    Ozell Silvan, PT, DPT, OCS, ATC 01/15/24  1:50 PM

## 2024-01-15 ENCOUNTER — Ambulatory Visit: Admitting: Rehabilitative and Restorative Service Providers"

## 2024-01-15 ENCOUNTER — Encounter: Payer: Self-pay | Admitting: Rehabilitative and Restorative Service Providers"

## 2024-01-15 DIAGNOSIS — M79604 Pain in right leg: Secondary | ICD-10-CM

## 2024-01-15 DIAGNOSIS — M79605 Pain in left leg: Secondary | ICD-10-CM

## 2024-01-15 DIAGNOSIS — R262 Difficulty in walking, not elsewhere classified: Secondary | ICD-10-CM

## 2024-01-15 DIAGNOSIS — M5459 Other low back pain: Secondary | ICD-10-CM

## 2024-01-15 DIAGNOSIS — M6281 Muscle weakness (generalized): Secondary | ICD-10-CM

## 2024-01-28 ENCOUNTER — Encounter: Payer: Self-pay | Admitting: Rehabilitative and Restorative Service Providers"

## 2024-01-28 ENCOUNTER — Encounter: Admitting: Physical Therapy

## 2024-01-28 ENCOUNTER — Ambulatory Visit (INDEPENDENT_AMBULATORY_CARE_PROVIDER_SITE_OTHER): Admitting: Rehabilitative and Restorative Service Providers"

## 2024-01-28 DIAGNOSIS — M79604 Pain in right leg: Secondary | ICD-10-CM

## 2024-01-28 DIAGNOSIS — M6281 Muscle weakness (generalized): Secondary | ICD-10-CM

## 2024-01-28 DIAGNOSIS — M5459 Other low back pain: Secondary | ICD-10-CM

## 2024-01-28 DIAGNOSIS — R262 Difficulty in walking, not elsewhere classified: Secondary | ICD-10-CM

## 2024-01-28 DIAGNOSIS — M79605 Pain in left leg: Secondary | ICD-10-CM | POA: Diagnosis not present

## 2024-01-28 NOTE — Therapy (Signed)
 OUTPATIENT PHYSICAL THERAPY TREATMENT   Patient Name: Krystal Lang MRN: [id number] DOB:Apr 02, 1955, 68 y.o., female Today's Date: 01/28/2024  END OF SESSION:  PT End of Session - 01/28/24 1312     Visit Number 2    Number of Visits 20    Date for Recertification  03/25/24    Authorization Type Medicare    Progress Note Due on Visit 10    PT Start Time 1306    PT Stop Time 1346    PT Time Calculation (min) 40 min    Activity Tolerance Patient tolerated treatment well;No increased pain    Behavior During Therapy WFL for tasks assessed/performed           Past Medical History:  Diagnosis Date   Anxiety    Arthritis    Cataract    Mixed form OU   Complication of anesthesia    after knee arthroscopy pateint difficult to wake up   Diabetes mellitus    Fibroid uterus 2001   H/O fatigue 1998   Headache(784.0)    denies   Hepatitis    A, 30 years ago   Hirsutism 1998   Idiopathic   History of chicken pox    Hypothyroidism    Menses, irregular 1998   Past Surgical History:  Procedure Laterality Date   COLONOSCOPY  02/2021   DILATION AND CURETTAGE OF UTERUS  03/31/2000   In [place]   KNEE ARTHROPLASTY Right    Replaced in [place], [place]   KNEE ARTHROSCOPY Right 2008   TONSILLECTOMY     As a child   TOTAL KNEE ARTHROPLASTY Left 08/21/2022   Procedure: LEFT TOTAL KNEE ARTHROPLASTY;  Surgeon: Vernetta Lonni GRADE, MD;  Location: [facility];  Service: Orthopedics;  Laterality: Left;   Patient Active Problem List   Diagnosis Date Noted   Status post total left knee replacement 08/21/2022   Dyspareunia, female 11/13/2011   Type 2 diabetes mellitus (HCC) 11/13/2011   Alopecia 11/13/2011   History of chicken pox    Headache    Hypothyroidism    Menses, irregular    Hirsutism    H/O fatigue    Fibroid uterus     PCP: Negri Pacific, MD  REFERRING PROVIDER: Vernetta Lonni GRADE, MD  REFERRING DIAG: M54.50,G89.29 (ICD-10-CM) - Chronic left-sided low back pain  without sciatica  Rationale for Evaluation and Treatment: Rehabilitation  THERAPY DIAG:  Pain in left leg  Pain in right leg  Other low back pain  Muscle weakness (generalized)  Difficulty in walking, not elsewhere classified  ONSET DATE: 2024 chronic   SUBJECTIVE:  SUBJECTIVE STATEMENT: Krystal Lang reports good HEP compliance since evaluation.  Posterior left knee, lateral left shin and lateral left hip are most irritating.  Pt indicated continued complaints for outside of Lt knee and also noted from Lt hip down leg to lateral lower leg.  Also reported complaints in posterior Lt knee.  Pt indicated some complaints on Rt but not as bad as Lt.   Pt indicated not continuing HEP as previous discussed.   Reported some difficulty with sleeping.    Reported walking 4 miles primarily as exercise at home.  Pt familiar to clinic for treatment in past for knee and back symptoms, most recently in early 2025.   PERTINENT HISTORY:  PMH significant for anxiety, OA, DM, hypothyroidism, and Lt TKA 08/21/22. Rt TKA   PAIN:  NPRS scale: Remains 4-6/10 Pain location: Posterior left knee, lateral left shin and lateral left hip are most irritating. Pain description: Stiff Aggravating factors: Going down hill walk/stairs, transfers Relieving factors: Exercises  PRECAUTIONS: None  WEIGHT BEARING RESTRICTIONS: No  FALLS:  Has patient fallen in last 6 months? No  LIVING ENVIRONMENT: Lives in: House/apartment Stairs: flight of stairs to bedroom  OCCUPATION: Work with computer work, standing/walking.   PLOF: Independent, gym based activity, walking  PATIENT GOALS: Reduce pain   OBJECTIVE:   DIAGNOSTIC FINDINGS:  See chart   PATIENT SURVEYS:  Patient-Specific Activity Scoring Scheme  0 represents "unable to  perform." 10 represents "able to perform at prior level. 0 1 2 3 4 5 6 7 8 9  10 (Date and Score)   Activity Eval  01/15/2024    1. Stairs   4    2. transfers  7    3. Sleeping  7   4. Walking down hills  4   5.    Score 5.5    Total score = sum of the activity scores/number of activities Minimum detectable change (90%CI) for average score = 2 points Minimum detectable change (90%CI) for single activity score = 3 points  SCREENING FOR RED FLAGS: 01/15/2024 Bowel or bladder incontinence: No Cauda equina syndrome: No  COGNITION: 01/15/2024 Overall cognitive status: WFL normal      SENSATION: 01/15/2024 WFL  MUSCLE LENGTH: 01/15/2024 No specific testing in clinic today  POSTURE:  01/15/2024 Unremarkable for current condition.   PALPATION: 01/15/2024 Tenderness with trigger points noted in Lt glute med/min, lateral Lt quad, Lateral knee joint, distal IT band insertion.   LUMBAR ROM:  01/15/2024 Directional Preference Assessment: Centralization: not observed Peripheralization: not observed   AROM Eval 01/15/2024  Flexion To mid shin without complaint.   Extension 50% WFL with no change in complaints.   Right lateral flexion   Left lateral flexion   Right rotation   Left rotation    (Blank rows = not tested)  LOWER EXTREMITY ROM:      Right Eval 01/15/2024 Left Eval 01/15/2024  Hip flexion    Hip extension    Hip abduction    Hip adduction    Hip internal rotation    Hip external rotation    Knee flexion 108 AROM heel slide in supine 108 AROM heel slide in supine  Knee extension 0 0  Ankle dorsiflexion    Ankle plantarflexion    Ankle inversion    Ankle eversion     (Blank rows = not tested)  LOWER EXTREMITY MMT:    MMT Right Eval 01/15/2024 Left Eval 01/15/2024  Hip flexion 5/5 5/5  Hip extension    Hip  abduction 4/5 4/5  Hip adduction    Hip internal rotation    Hip external rotation    Knee flexion 5/5 5/5  Knee extension  5/5 45, 40.5 lbs 4+/5 37.4, 37.8 lbs  Ankle dorsiflexion 5/5 5/5  Ankle plantarflexion    Ankle inversion    Ankle eversion     (Blank rows = not tested)  SPECIAL TESTS:  01/15/2024 (-) slump bilaterally.   FUNCTIONAL TESTS:  01/15/2024 18 inch chair transfer s UE assist on 1st try, pain noted.  Trendelenburg in SLS assessment, Lt > Rt.  Lt SLS:  approx. 5 seconds Rt SLS : < 3 seconds   GAIT: 01/15/2024 Independent ambulation                                                                                                                                                                                                                   TODAY'S TREATMENT:                                                                                                         DATE:  01/28/2024 Recumbent bike Seat 6 for 5 minutes Level 2 Supine IT Band stretch 5 x 20 seconds each side (emphasis on adduction and IR in supine hamstrings stretch position) Pelvic rotation 10 x 10 seconds Side-lie clams with Black Thera-band resistance 10 x bilateral Yoga Bridge 10 x 5 seconds Single knee to chest 4 x 15 seconds Seated quad sets 5 x each side Seated straight leg raises 5 x each side Hip hike 10 x 5 seconds   01/15/2024  Therex:    HEP instruction/performance c cues for techniques, handout provided.  Trial set performed of each for comprehension and symptom assessment.  See below for exercise list Additional time spent in review of handout and emphasis on routine use.    Manual Percussive device to Lt lateral hip glute med/min, Lt quad/lateral thigh.   PATIENT EDUCATION:  01/15/2024 Education details: HEP, POC Person educated: Patient Education method: Explanation, Demonstration, Verbal cues, and Handouts Education comprehension: verbalized understanding,  returned demonstration, and verbal cues required  HOME EXERCISE PROGRAM: Access Code: [email or web address] URL:  [email or web address] Date: 01/28/2024 Prepared by: Lamar Ivory  Exercises - Standing Lumbar Extension  - 2-4 x daily - 7 x weekly - 1 sets - 5-10 reps - Supine Lower Trunk Rotation  - 1-2 x daily - 7 x weekly - 1 sets - 3-5 reps - 15 hold - Clamshell  - 1-2 x daily - 7 x weekly - 2-3 sets - 10-15 reps - Supine Bridge  - 1-2 x daily - 7 x weekly - 1-2 sets - 10 reps - 2-5 hold - Supine Single Knee to Chest Stretch  - 1-2 x daily - 7 x weekly - 1 sets - 5 reps - 15 hold - Seated Quad Set  - 1-2 x daily - 7 x weekly - 1 sets - 10 reps - 5 hold - Seated SLR  - 1-2 x daily - 7 x weekly - 1-2 sets - 10-15 reps - 2 hold - Standing Hip Hiking  - 1-2 x daily - 7 x weekly - 1 sets - 10 reps - 5 hold - Standing Lumbar Extension at Wall - Forearms  - 5 x daily - 7 x weekly - 1 sets - 5 reps - 3 seconds hold - Supine ITB Stretch with Strap  - 2 x daily - 7 x weekly - 1 sets - 5 reps - 15 seconds hold    ASSESSMENT:  CLINICAL IMPRESSION: Krystal Lang reports and demonstrates good understanding of her day 1 home exercises.  We had a discussion about the importance of continued home exercise compliance to meet long-term goals and that she will need to continue certain exercises long-term to continue to manage her symptoms and be fully functional.  Krystal Lang did have a positive Ober's test on the symptomatic left side and she was not very flexible on the right so I added a supine IT band stretch to her current excellent program.  She will continue to benefit from supervised physical therapy to meet long-term goals.  Patient is a 68 y.o. who comes to clinic with complaints of pain both legs Lt > Rt pain with mobility, strength and movement coordination deficits that impair their ability to perform usual daily and recreational functional activities without increase difficulty/symptoms at this time.  Patient to benefit from skilled PT services to address impairments and limitations to improve to previous  level of function without restriction secondary to condition.   OBJECTIVE IMPAIRMENTS: Abnormal gait, decreased activity tolerance, decreased balance, decreased coordination, decreased endurance, decreased mobility, difficulty walking, decreased ROM, decreased strength, increased fascial restrictions, impaired perceived functional ability, increased muscle spasms, impaired flexibility, improper body mechanics, and pain.   ACTIVITY LIMITATIONS: bending, sitting, standing, squatting, sleeping, stairs, transfers, bed mobility, and locomotion level  PARTICIPATION LIMITATIONS: meal prep, cleaning, laundry, interpersonal relationship, shopping, and community activity  PERSONAL FACTORS: Time since onset of injury/illness/exacerbation and PMH significant for anxiety, OA, DM, hypothyroidism, and Lt TKA 08/21/22.  Rt TKA are also affecting patient's functional outcome.   REHAB POTENTIAL: Good  CLINICAL DECISION MAKING: Evolving/moderate complexity  EVALUATION COMPLEXITY: Moderate   GOALS: Goals reviewed with patient? Yes  SHORT TERM GOALS: (target date for Short term goals are 3 weeks 02/15/2024)  1. Patient will demonstrate independent use of home exercise program to maintain progress from in clinic treatments.  Goal status: Ongoing 01/28/2024  LONG TERM GOALS: (target dates for all long term goals are 10 weeks  03/25/2024 )   1.  Patient will demonstrate/report pain at worst less than or equal to 2/10 to facilitate minimal limitation in daily activity secondary to pain symptoms.  Goal status: New   2. Patient will demonstrate independent use of home exercise program to facilitate ability to maintain/progress functional gains from skilled physical therapy services.  Goal status: New   3. Patient will demonstrate Patient specific functional scale avg > or = 8/10 to indicate reduced disability due to condition.   Goal status: New   4. Patient will demonstrate lumbar extension 100 % WFL s  symptoms to facilitate upright standing, walking posture at PLOF s limitation.  Goal status: New   5.  Patient will demonstrate bilateral hip MMT 5/5, knee extension dynamometry improvement > 15 lbs to facilitate stairs, incline/decline walking and other daily activity at PLOF.   Goal status: New   6.  Patient will demonstrate SLS bilaterally > 10 seconds to facilitate stability in ambulation.  Goal status: New   7.  Patient will demonstrate ascending/descending stairs reciprocally s UE assist for community integration.   Goal Status: New  PLAN:  PT FREQUENCY: 1-2x/week  PT DURATION: 10 weeks  PLANNED INTERVENTIONS: Can include 02853- PT Re-evaluation, 97110-Therapeutic exercises, 97530- Therapeutic activity, [id number]- Neuromuscular re-education, 97535- Self Care, 97140- Manual therapy, [phone]- Gait training, [phone]- Orthotic Fit/training, [phone]- Canalith repositioning, [id number]- Aquatic Therapy, [phone]- Electrical stimulation (unattended), [id number] Physical performance testing, 97016- Vasopneumatic device, [id number]- Ultrasound, [id number]- Traction (mechanical), [id number]- Ionotophoresis 4mg /ml Dexamethasone ,  [id number] - Needle insertion w/o injection 1 or 2 muscles, 20561 - Needle insertion w/o injection 3 or more muscles.    Patient/Family education, Balance training, Stair training, Taping, Dry Needling, Joint mobilization, Joint manipulation, Spinal manipulation, Spinal mobilization, Scar mobilization, Vestibular training, Visual/preceptual remediation/compensation, DME instructions, Cryotherapy, and Moist heat.  All performed as medically necessary.  All included unless contraindicated  PLAN FOR NEXT SESSION: Review HEP knowledge/results.  Needs continued strengthening for lateral/posterior hip, quads.  Percussive device as indicated/tolerated.    Myer LELON Ivory PT, MPT 01/28/24  5:11 PM

## 2024-01-28 NOTE — Therapy (Incomplete)
 OUTPATIENT PHYSICAL THERAPY EVALUATION   Patient Name: HALIMA FOGAL MRN: [id number] DOB:1956-03-09, 68 y.o., female Today's Date: 01/28/2024  END OF SESSION:    Past Medical History:  Diagnosis Date   Anxiety    Arthritis    Cataract    Mixed form OU   Complication of anesthesia    after knee arthroscopy pateint difficult to wake up   Diabetes mellitus    Fibroid uterus 2001   H/O fatigue 1998   Headache(784.0)    denies   Hepatitis    A, 30 years ago   Hirsutism 1998   Idiopathic   History of chicken pox    Hypothyroidism    Menses, irregular 1998   Past Surgical History:  Procedure Laterality Date   COLONOSCOPY  02/2021   DILATION AND CURETTAGE OF UTERUS  03/31/2000   [place]   KNEE ARTHROPLASTY Right    Replaced in [place], [place]   KNEE ARTHROSCOPY Right 2008   TONSILLECTOMY     As a child   TOTAL KNEE ARTHROPLASTY Left 08/21/2022   Procedure: LEFT TOTAL KNEE ARTHROPLASTY;  Surgeon: Vernetta Lonni GRADE, MD;  Location: [facility];  Service: Orthopedics;  Laterality: Left;   Patient Active Problem List   Diagnosis Date Noted   Status post total left knee replacement 08/21/2022   Dyspareunia, female 11/13/2011   Type 2 diabetes mellitus (HCC) 11/13/2011   Alopecia 11/13/2011   History of chicken pox    Headache    Hypothyroidism    Menses, irregular    Hirsutism    H/O fatigue    Fibroid uterus     PCP: Negri Pacific, MD  REFERRING PROVIDER: Vernetta Lonni GRADE, MD  REFERRING DIAG: M54.50,G89.29 (ICD-10-CM) - Chronic left-sided low back pain without sciatica  Rationale for Evaluation and Treatment: Rehabilitation  THERAPY DIAG:  No diagnosis found.  ONSET DATE: 2024 chronic   SUBJECTIVE:                                                                                                                                                                                           SUBJECTIVE STATEMENT: *** Pt indicated continued complaints for  outside of Lt knee and also noted from Lt hip down leg to lateral lower leg.  Also reported complaints in posterior Lt knee.  Pt indicated some complaints on Rt but not as bad as Lt.   Pt indicated not continuing HEP as previous discussed.   Reported some difficulty with sleeping.    Reported walking 4 miles primarily as exercise at home.  Pt familiar to clinic for treatment in past for knee and back  symptoms, most recently in early 2025.   PERTINENT HISTORY:  PMH significant for anxiety, OA, DM, hypothyroidism, and Lt TKA 08/21/22. Rt TKA   PAIN:  NPRS scale: at worst 6/10, at best 4/10 Pain location: Lt knee, lateral leg from hip to lower leg.  Pain description: jabbing, stiff Aggravating factors: going down hill walk/stairs,  transfers.  Relieving factors: rest   PRECAUTIONS: None  WEIGHT BEARING RESTRICTIONS: No  FALLS:  Has patient fallen in last 6 months? No  LIVING ENVIRONMENT: Lives in: House/apartment Stairs: flight of stairs to bedroom  OCCUPATION: Work with computer work, standing/walking.   PLOF: Independent, gym based activity, walking  PATIENT GOALS: Reduce pain   OBJECTIVE:   DIAGNOSTIC FINDINGS:  See chart   PATIENT SURVEYS:  Patient-Specific Activity Scoring Scheme  0 represents "unable to perform." 10 represents "able to perform at prior level. 0 1 2 3 4 5 6 7 8 9  10 (Date and Score)   Activity Eval  01/15/2024    1. Stairs   4    2. transfers  7    3. Sleeping  7   4. Walking down hills  4   5.    Score 5.5    Total score = sum of the activity scores/number of activities Minimum detectable change (90%CI) for average score = 2 points Minimum detectable change (90%CI) for single activity score = 3 points  SCREENING FOR RED FLAGS: 01/15/2024 Bowel or bladder incontinence: No Cauda equina syndrome: No  COGNITION: 01/15/2024 Overall cognitive status: WFL normal      SENSATION: 01/15/2024 WFL  MUSCLE LENGTH: 01/15/2024 No  specific testing in clinic today  POSTURE:  01/15/2024 Unremarkable for current condition.   PALPATION: 01/15/2024 Tenderness with trigger points noted in Lt glute med/min, lateral Lt quad, Lateral knee joint, distal IT band insertion.   LUMBAR ROM:  01/15/2024 Directional Preference Assessment: Centralization: not observed Peripheralization: not observed   AROM Eval 01/15/2024  Flexion To mid shin without complaint.   Extension 50% WFL with no change in complaints.   Right lateral flexion   Left lateral flexion   Right rotation   Left rotation    (Blank rows = not tested)  LOWER EXTREMITY ROM:      Right Eval 01/15/2024 Left Eval 01/15/2024  Hip flexion    Hip extension    Hip abduction    Hip adduction    Hip internal rotation    Hip external rotation    Knee flexion 108 AROM heel slide in supine 108 AROM heel slide in supine  Knee extension 0 0  Ankle dorsiflexion    Ankle plantarflexion    Ankle inversion    Ankle eversion     (Blank rows = not tested)  LOWER EXTREMITY MMT:    MMT Right Eval 01/15/2024 Left Eval 01/15/2024  Hip flexion 5/5 5/5  Hip extension    Hip abduction 4/5 4/5  Hip adduction    Hip internal rotation    Hip external rotation    Knee flexion 5/5 5/5  Knee extension 5/5 45, 40.5 lbs 4+/5 37.4, 37.8 lbs  Ankle dorsiflexion 5/5 5/5  Ankle plantarflexion    Ankle inversion    Ankle eversion     (Blank rows = not tested)  SPECIAL TESTS:  01/15/2024 (-) slump bilaterally.   FUNCTIONAL TESTS:  01/15/2024 18 inch chair transfer s UE assist on 1st try, pain noted.  Trendelenburg in SLS assessment, Lt > Rt.  Lt SLS:  approx. 5  seconds Rt SLS : < 3 seconds   GAIT: 01/15/2024 Independent ambulation                                                                                                                                                                                                                   TODAY'S  TREATMENT 01/28/24 ***  01/15/2024  Therex:    HEP instruction/performance c cues for techniques, handout provided.  Trial set performed of each for comprehension and symptom assessment.  See below for exercise list Additional time spent in review of handout and emphasis on routine use.    Manual Percussive device to Lt lateral hip glute med/min, Lt quad/lateral thigh.   PATIENT EDUCATION:  01/15/2024 Education details: HEP, POC Person educated: Patient Education method: Programmer, Multimedia, Demonstration, Verbal cues, and Handouts Education comprehension: verbalized understanding, returned demonstration, and verbal cues required  HOME EXERCISE PROGRAM: Access Code: [email or web address] URL: [email or web address] Date: 01/15/2024 Prepared by: Ozell Silvan  Exercises - Standing Lumbar Extension  - 2-4 x daily - 7 x weekly - 1 sets - 5-10 reps - Supine Lower Trunk Rotation  - 1-2 x daily - 7 x weekly - 1 sets - 3-5 reps - 15 hold - Clamshell  - 1-2 x daily - 7 x weekly - 2-3 sets - 10-15 reps - Supine Bridge  - 1-2 x daily - 7 x weekly - 1-2 sets - 10 reps - 2 hold - Supine Single Knee to Chest Stretch  - 1-2 x daily - 7 x weekly - 1 sets - 5 reps - 15 hold - Seated Quad Set  - 1-2 x daily - 7 x weekly - 1 sets - 10 reps - 5 hold - Seated SLR  - 1-2 x daily - 7 x weekly - 1-2 sets - 10-15 reps - 2 hold - Standing Hip Hiking  - 1-2 x daily - 7 x weekly - 1 sets - 10 reps - 5 hold    ASSESSMENT:  CLINICAL IMPRESSION: *** Patient is a 68 y.o. who comes to clinic with complaints of pain both legs Lt > Rt pain with mobility, strength and movement coordination deficits that impair their ability to perform usual daily and recreational functional activities without increase difficulty/symptoms at this time.  Patient to benefit from skilled PT services to address impairments and limitations to improve to previous level of function without restriction secondary to condition.   OBJECTIVE  IMPAIRMENTS: Abnormal gait, decreased activity tolerance, decreased balance, decreased coordination, decreased endurance, decreased mobility,  difficulty walking, decreased ROM, decreased strength, increased fascial restrictions, impaired perceived functional ability, increased muscle spasms, impaired flexibility, improper body mechanics, and pain.   ACTIVITY LIMITATIONS: bending, sitting, standing, squatting, sleeping, stairs, transfers, bed mobility, and locomotion level  PARTICIPATION LIMITATIONS: meal prep, cleaning, laundry, interpersonal relationship, shopping, and community activity  PERSONAL FACTORS: Time since onset of injury/illness/exacerbation and PMH significant for anxiety, OA, DM, hypothyroidism, and Lt TKA 08/21/22.  Rt TKA are also affecting patient's functional outcome.   REHAB POTENTIAL: Good  CLINICAL DECISION MAKING: Evolving/moderate complexity  EVALUATION COMPLEXITY: Moderate   GOALS: Goals reviewed with patient? Yes  SHORT TERM GOALS: (target date for Short term goals are 3 weeks 02/15/2024)  1. Patient will demonstrate independent use of home exercise program to maintain progress from in clinic treatments.  Goal status: New  LONG TERM GOALS: (target dates for all long term goals are 10 weeks  03/25/2024 )   1. Patient will demonstrate/report pain at worst less than or equal to 2/10 to facilitate minimal limitation in daily activity secondary to pain symptoms.  Goal status: New   2. Patient will demonstrate independent use of home exercise program to facilitate ability to maintain/progress functional gains from skilled physical therapy services.  Goal status: New   3. Patient will demonstrate Patient specific functional scale avg > or = 8/10 to indicate reduced disability due to condition.   Goal status: New   4. Patient will demonstrate lumbar extension 100 % WFL s symptoms to facilitate upright standing, walking posture at PLOF s limitation.  Goal  status: New   5.  Patient will demonstrate bilateral hip MMT 5/5, knee extension dynamometry improvement > 15 lbs to facilitate stairs, incline/decline walking and other daily activity at PLOF.   Goal status: New   6.  Patient will demonstrate SLS bilaterally > 10 seconds to facilitate stability in ambulation.  Goal status: New   7.  Patient will demonstrate ascending/descending stairs reciprocally s UE assist for community integration.   Goal Status: New  PLAN:  PT FREQUENCY: 1-2x/week  PT DURATION: 10 weeks  PLANNED INTERVENTIONS: Can include 02853- PT Re-evaluation, 97110-Therapeutic exercises, 97530- Therapeutic activity, [id number]- Neuromuscular re-education, 97535- Self Care, 97140- Manual therapy, [phone]- Gait training, [phone]- Orthotic Fit/training, [phone]- Canalith repositioning, [id number]- Aquatic Therapy, [phone]- Electrical stimulation (unattended), [id number] Physical performance testing, 97016- Vasopneumatic device, [id number]- Ultrasound, [id number]- Traction (mechanical), [id number]- Ionotophoresis 4mg /ml Dexamethasone ,  79439 - Needle insertion w/o injection 1 or 2 muscles, 20561 - Needle insertion w/o injection 3 or more muscles.    Patient/Family education, Balance training, Stair training, Taping, Dry Needling, Joint mobilization, Joint manipulation, Spinal manipulation, Spinal mobilization, Scar mobilization, Vestibular training, Visual/preceptual remediation/compensation, DME instructions, Cryotherapy, and Moist heat.  All performed as medically necessary.  All included unless contraindicated  PLAN FOR NEXT SESSION: *** Review HEP knowledge/results.  Needs continued strengthening for lateral/posterior hip, quads.  Percussive device as indicated/tolerated.    Corean JULIANNA Ku, PT, DPT 01/28/24 7:28 AM

## 2024-02-01 ENCOUNTER — Encounter: Payer: Self-pay | Admitting: Physical Therapy

## 2024-02-01 ENCOUNTER — Encounter: Payer: Self-pay | Admitting: Radiology

## 2024-02-01 ENCOUNTER — Ambulatory Visit (INDEPENDENT_AMBULATORY_CARE_PROVIDER_SITE_OTHER): Admitting: Physical Therapy

## 2024-02-01 DIAGNOSIS — M6281 Muscle weakness (generalized): Secondary | ICD-10-CM

## 2024-02-01 DIAGNOSIS — M5459 Other low back pain: Secondary | ICD-10-CM

## 2024-02-01 DIAGNOSIS — R262 Difficulty in walking, not elsewhere classified: Secondary | ICD-10-CM

## 2024-02-01 DIAGNOSIS — M79605 Pain in left leg: Secondary | ICD-10-CM | POA: Diagnosis not present

## 2024-02-01 DIAGNOSIS — M79604 Pain in right leg: Secondary | ICD-10-CM

## 2024-02-01 NOTE — Therapy (Signed)
 OUTPATIENT PHYSICAL THERAPY TREATMENT   Patient Name: Krystal Lang MRN: [id number] DOB:[birth year], 68 y.o., female Today's Date: 02/01/2024  END OF SESSION:  PT End of Session - 02/01/24 1153     Visit Number 3    Number of Visits 20    Date for Recertification  03/25/24    Authorization Type Medicare    Progress Note Due on Visit 10    PT Start Time 1153    PT Stop Time 1233    PT Time Calculation (min) 40 min    Activity Tolerance Patient tolerated treatment well;No increased pain    Behavior During Therapy WFL for tasks assessed/performed            Past Medical History:  Diagnosis Date   Anxiety    Arthritis    Cataract    Mixed form OU   Complication of anesthesia    after knee arthroscopy pateint difficult to wake up   Diabetes mellitus    Fibroid uterus 2001   H/O fatigue 1998   Headache(784.0)    denies   Hepatitis    A, 30 years ago   Hirsutism 1998   Idiopathic   History of chicken pox    Hypothyroidism    Menses, irregular 1998   Past Surgical History:  Procedure Laterality Date   COLONOSCOPY  02/2021   DILATION AND CURETTAGE OF UTERUS  03/31/2000   In [place]   KNEE ARTHROPLASTY Right    Replaced in [place], [place]   KNEE ARTHROSCOPY Right 2008   TONSILLECTOMY     As a child   TOTAL KNEE ARTHROPLASTY Left 08/21/2022   Procedure: LEFT TOTAL KNEE ARTHROPLASTY;  Surgeon: Vernetta Lonni GRADE, MD;  Location: [facility];  Service: Orthopedics;  Laterality: Left;   Patient Active Problem List   Diagnosis Date Noted   Status post total left knee replacement 08/21/2022   Dyspareunia, female 11/13/2011   Type 2 diabetes mellitus (HCC) 11/13/2011   Alopecia 11/13/2011   History of chicken pox    Headache    Hypothyroidism    Menses, irregular    Hirsutism    H/O fatigue    Fibroid uterus     PCP: Negri Pacific, MD  REFERRING PROVIDER: Vernetta Lonni GRADE, MD  REFERRING DIAG: M54.50,G89.29 (ICD-10-CM) - Chronic left-sided low back pain  without sciatica  Rationale for Evaluation and Treatment: Rehabilitation  THERAPY DIAG:  Pain in left leg  Pain in right leg  Other low back pain  Muscle weakness (generalized)  Difficulty in walking, not elsewhere classified  ONSET DATE: 2024 chronic   SUBJECTIVE:  SUBJECTIVE STATEMENT: Still having some discomfort; want to work on strengthening   Reported walking 4 miles primarily as exercise at home.  Pt familiar to clinic for treatment in past for knee and back symptoms, most recently in early 2025.   PERTINENT HISTORY:  PMH significant for anxiety, OA, DM, hypothyroidism, and Lt TKA 08/21/22. Rt TKA   PAIN:  NPRS scale: Remains 4-6/10 Pain location: Posterior left knee, lateral left shin and lateral left hip are most irritating. Pain description: Stiff Aggravating factors: Going down hill walk/stairs, transfers Relieving factors: Exercises  PRECAUTIONS: None  WEIGHT BEARING RESTRICTIONS: No  FALLS:  Has patient fallen in last 6 months? No  LIVING ENVIRONMENT: Lives in: House/apartment Stairs: flight of stairs to bedroom  OCCUPATION: Work with computer work, standing/walking.   PLOF: Independent, gym based activity, walking  PATIENT GOALS: Reduce pain   OBJECTIVE:   DIAGNOSTIC FINDINGS:  See chart   PATIENT SURVEYS:  Patient-Specific Activity Scoring Scheme  0 represents "unable to perform." 10 represents "able to perform at prior level. 0 1 2 3 4 5 6 7 8 9  10 (Date and Score)   Activity Eval  01/15/2024    1. Stairs   4    2. transfers  7    3. Sleeping  7   4. Walking down hills  4   5.    Score 5.5    Total score = sum of the activity scores/number of activities Minimum detectable change (90%CI) for average score = 2 points Minimum detectable change  (90%CI) for single activity score = 3 points  SCREENING FOR RED FLAGS: 01/15/2024 Bowel or bladder incontinence: No Cauda equina syndrome: No  COGNITION: 01/15/2024 Overall cognitive status: WFL normal      SENSATION: 01/15/2024 WFL  MUSCLE LENGTH: 01/15/2024 No specific testing in clinic today  POSTURE:  01/15/2024 Unremarkable for current condition.   PALPATION: 01/15/2024 Tenderness with trigger points noted in Lt glute med/min, lateral Lt quad, Lateral knee joint, distal IT band insertion.   LUMBAR ROM:  01/15/2024 Directional Preference Assessment: Centralization: not observed Peripheralization: not observed   AROM Eval 01/15/2024  Flexion To mid shin without complaint.   Extension 50% WFL with no change in complaints.    (Blank rows = not tested)  LOWER EXTREMITY ROM:      Right Eval 01/15/2024 Left Eval 01/15/2024  Knee flexion 108 AROM heel slide in supine 108 AROM heel slide in supine  Knee extension 0 0   (Blank rows = not tested)  LOWER EXTREMITY MMT:    MMT Right Eval 01/15/2024 Left Eval 01/15/2024  Hip flexion 5/5 5/5  Hip extension    Hip abduction 4/5 4/5  Hip adduction    Hip internal rotation    Hip external rotation    Knee flexion 5/5 5/5  Knee extension 5/5 45, 40.5 lbs 4+/5 37.4, 37.8 lbs  Ankle dorsiflexion 5/5 5/5  Ankle plantarflexion    Ankle inversion    Ankle eversion     (Blank rows = not tested)  SPECIAL TESTS:  01/15/2024 (-) slump bilaterally.   FUNCTIONAL TESTS:  01/15/2024 18 inch chair transfer s UE assist on 1st try, pain noted.  Trendelenburg in SLS assessment, Lt > Rt.  Lt SLS:  approx. 5 seconds Rt SLS : < 3 seconds   GAIT: 01/15/2024 Independent ambulation  TODAY'S  TREATMENT 02/01/24 TherAct NuStep L6 x 8 min Hooklying single limb clamshell 2x10; L5 band Bridges with isometric abduction with L5 band 5 sec hold; 2x10 Sidelying hip circles 2x10 CW/CCW; performed bil Single limb leg press 50# 2x10; LLE only Knee ext machine bil concentric; LLE eccentric 2x10; 10#     01/28/2024 Recumbent bike Seat 6 for 5 minutes Level 2 Supine IT Band stretch 5 x 20 seconds each side (emphasis on adduction and IR in supine hamstrings stretch position) Pelvic rotation 10 x 10 seconds Side-lie clams with Black Thera-band resistance 10 x bilateral Yoga Bridge 10 x 5 seconds Single knee to chest 4 x 15 seconds Seated quad sets 5 x each side Seated straight leg raises 5 x each side Hip hike 10 x 5 seconds   01/15/2024  Therex:    HEP instruction/performance c cues for techniques, handout provided.  Trial set performed of each for comprehension and symptom assessment.  See below for exercise list Additional time spent in review of handout and emphasis on routine use.    Manual Percussive device to Lt lateral hip glute med/min, Lt quad/lateral thigh.   PATIENT EDUCATION:  01/15/2024 Education details: HEP, POC Person educated: Patient Education method: Programmer, Multimedia, Demonstration, Verbal cues, and Handouts Education comprehension: verbalized understanding, returned demonstration, and verbal cues required  HOME EXERCISE PROGRAM: Access Code: [email or web address] URL: [email or web address] Date: 02/01/2024 Prepared by: Corean Ku  Exercises - Standing Lumbar Extension  - 2-4 x daily - 7 x weekly - 1 sets - 5-10 reps - Supine Lower Trunk Rotation  - 1-2 x daily - 7 x weekly - 1 sets - 3-5 reps - 15 hold - Clamshell  - 1-2 x daily - 7 x weekly - 2-3 sets - 10-15 reps - Supine Bridge  - 1-2 x daily - 7 x weekly - 1-2 sets - 10 reps - 2-5 hold - Supine Single Knee to Chest Stretch  - 1-2 x daily - 7 x weekly - 1 sets - 5 reps - 15 hold - Seated Quad  Set  - 1-2 x daily - 7 x weekly - 1 sets - 10 reps - 5 hold - Seated SLR  - 1-2 x daily - 7 x weekly - 1-2 sets - 10-15 reps - 2 hold - Standing Hip Hiking  - 1-2 x daily - 7 x weekly - 1 sets - 10 reps - 5 hold - Standing Lumbar Extension at Wall - Forearms  - 5 x daily - 7 x weekly - 1 sets - 5 reps - 3 seconds hold - Supine ITB Stretch with Strap  - 2 x daily - 7 x weekly - 1 sets - 5 reps - 15 seconds hold - Hooklying Single Leg Bent Knee Fallouts with Resistance  - 1 x daily - 7 x weekly - 3 sets - 10 reps - Sidelying Hip Circles  - 2 x daily - 7 x weekly - 2 sets - 10 circles each way    ASSESSMENT:  CLINICAL IMPRESSION: Pt tolerated session well with focus on lateral hip stability and strengthening as well as quad strengthening.  Will continue to benefit from PT to maximize function.   Patient is a 68 y.o. who comes to clinic with complaints of pain both legs Lt > Rt pain with mobility, strength and movement coordination deficits that impair their ability to perform usual daily and recreational functional activities without increase difficulty/symptoms at this time.  Patient to benefit from skilled PT services  to address impairments and limitations to improve to previous level of function without restriction secondary to condition.   OBJECTIVE IMPAIRMENTS: Abnormal gait, decreased activity tolerance, decreased balance, decreased coordination, decreased endurance, decreased mobility, difficulty walking, decreased ROM, decreased strength, increased fascial restrictions, impaired perceived functional ability, increased muscle spasms, impaired flexibility, improper body mechanics, and pain.   ACTIVITY LIMITATIONS: bending, sitting, standing, squatting, sleeping, stairs, transfers, bed mobility, and locomotion level  PARTICIPATION LIMITATIONS: meal prep, cleaning, laundry, interpersonal relationship, shopping, and community activity  PERSONAL FACTORS: Time since onset of  injury/illness/exacerbation and PMH significant for anxiety, OA, DM, hypothyroidism, and Lt TKA 08/21/22.  Rt TKA are also affecting patient's functional outcome.   REHAB POTENTIAL: Good  CLINICAL DECISION MAKING: Evolving/moderate complexity  EVALUATION COMPLEXITY: Moderate   GOALS: Goals reviewed with patient? Yes  SHORT TERM GOALS: (target date for Short term goals are 3 weeks 02/15/2024)  1. Patient will demonstrate independent use of home exercise program to maintain progress from in clinic treatments.  Goal status: Ongoing 01/28/2024  LONG TERM GOALS: (target dates for all long term goals are 10 weeks  03/25/2024 )   1. Patient will demonstrate/report pain at worst less than or equal to 2/10 to facilitate minimal limitation in daily activity secondary to pain symptoms.  Goal status: New   2. Patient will demonstrate independent use of home exercise program to facilitate ability to maintain/progress functional gains from skilled physical therapy services.  Goal status: New   3. Patient will demonstrate Patient specific functional scale avg > or = 8/10 to indicate reduced disability due to condition.   Goal status: New   4. Patient will demonstrate lumbar extension 100 % WFL s symptoms to facilitate upright standing, walking posture at PLOF s limitation.  Goal status: New   5.  Patient will demonstrate bilateral hip MMT 5/5, knee extension dynamometry improvement > 15 lbs to facilitate stairs, incline/decline walking and other daily activity at PLOF.   Goal status: New   6.  Patient will demonstrate SLS bilaterally > 10 seconds to facilitate stability in ambulation.  Goal status: New   7.  Patient will demonstrate ascending/descending stairs reciprocally s UE assist for community integration.   Goal Status: New  PLAN:  PT FREQUENCY: 1-2x/week  PT DURATION: 10 weeks  PLANNED INTERVENTIONS: Can include 02853- PT Re-evaluation, 97110-Therapeutic exercises, 97530-  Therapeutic activity, [id number]- Neuromuscular re-education, 97535- Self Care, 97140- Manual therapy, [phone]- Gait training, [phone]- Orthotic Fit/training, [phone]- Canalith repositioning, [id number]- Aquatic Therapy, [phone]- Electrical stimulation (unattended), [id number] Physical performance testing, 97016- Vasopneumatic device, [id number]- Ultrasound, [id number]- Traction (mechanical), [id number]- Ionotophoresis 4mg /ml Dexamethasone ,  79439 - Needle insertion w/o injection 1 or 2 muscles, 20561 - Needle insertion w/o injection 3 or more muscles.    Patient/Family education, Balance training, Stair training, Taping, Dry Needling, Joint mobilization, Joint manipulation, Spinal manipulation, Spinal mobilization, Scar mobilization, Vestibular training, Visual/preceptual remediation/compensation, DME instructions, Cryotherapy, and Moist heat.  All performed as medically necessary.  All included unless contraindicated  PLAN FOR NEXT SESSION:   Needs continued strengthening for lateral/posterior hip, quads.  Percussive device as indicated/tolerated.     Corean JULIANNA Ku, PT, DPT 02/01/24 12:54 PM

## 2024-02-03 ENCOUNTER — Encounter: Payer: Self-pay | Admitting: Physical Therapy

## 2024-02-03 ENCOUNTER — Ambulatory Visit: Admitting: Physical Therapy

## 2024-02-03 DIAGNOSIS — M6281 Muscle weakness (generalized): Secondary | ICD-10-CM

## 2024-02-03 DIAGNOSIS — M79605 Pain in left leg: Secondary | ICD-10-CM | POA: Diagnosis not present

## 2024-02-03 DIAGNOSIS — M79604 Pain in right leg: Secondary | ICD-10-CM | POA: Diagnosis not present

## 2024-02-03 DIAGNOSIS — R262 Difficulty in walking, not elsewhere classified: Secondary | ICD-10-CM

## 2024-02-03 DIAGNOSIS — M5459 Other low back pain: Secondary | ICD-10-CM

## 2024-02-03 NOTE — Therapy (Signed)
 OUTPATIENT PHYSICAL THERAPY TREATMENT   Patient Name: Krystal Lang MRN: [id number] DOB:12-Nov-1955, 68 y.o., female Today's Date: 02/03/2024  END OF SESSION:  PT End of Session - 02/03/24 1150     Visit Number 4    Number of Visits 20    Date for Recertification  03/25/24    Authorization Type Medicare    Progress Note Due on Visit 10    PT Start Time 1146    PT Stop Time 1226    PT Time Calculation (min) 40 min    Activity Tolerance Patient tolerated treatment well;No increased pain    Behavior During Therapy WFL for tasks assessed/performed             Past Medical History:  Diagnosis Date   Anxiety    Arthritis    Cataract    Mixed form OU   Complication of anesthesia    after knee arthroscopy pateint difficult to wake up   Diabetes mellitus    Fibroid uterus 2001   H/O fatigue 1998   Headache(784.0)    denies   Hepatitis    A, 30 years ago   Hirsutism 1998   Idiopathic   History of chicken pox    Hypothyroidism    Menses, irregular 1998   Past Surgical History:  Procedure Laterality Date   COLONOSCOPY  02/2021   DILATION AND CURETTAGE OF UTERUS  03/31/2000   In [place]   KNEE ARTHROPLASTY Right    Replaced in [place], [place]   KNEE ARTHROSCOPY Right 2008   TONSILLECTOMY     As a child   TOTAL KNEE ARTHROPLASTY Left 08/21/2022   Procedure: LEFT TOTAL KNEE ARTHROPLASTY;  Surgeon: Vernetta Lonni GRADE, MD;  Location: [facility];  Service: Orthopedics;  Laterality: Left;   Patient Active Problem List   Diagnosis Date Noted   Status post total left knee replacement 08/21/2022   Dyspareunia, female 11/13/2011   Type 2 diabetes mellitus (HCC) 11/13/2011   Alopecia 11/13/2011   History of chicken pox    Headache    Hypothyroidism    Menses, irregular    Hirsutism    H/O fatigue    Fibroid uterus     PCP: Negri Pacific, MD  REFERRING PROVIDER: Vernetta Lonni GRADE, MD  REFERRING DIAG: M54.50,G89.29 (ICD-10-CM) - Chronic left-sided low back pain  without sciatica  Rationale for Evaluation and Treatment: Rehabilitation  THERAPY DIAG:  Pain in left leg  Pain in right leg  Other low back pain  Muscle weakness (generalized)  Difficulty in walking, not elsewhere classified  ONSET DATE: 2024 chronic   SUBJECTIVE:  SUBJECTIVE STATEMENT: Lt quad is a little sore today, but otherwise doing well   Reported walking 4 miles primarily as exercise at home.  Pt familiar to clinic for treatment in past for knee and back symptoms, most recently in early 2025.   PERTINENT HISTORY:  PMH significant for anxiety, OA, DM, hypothyroidism, and Lt TKA 08/21/22. Rt TKA   PAIN:  NPRS scale: Remains 4-6/10 Pain location: Posterior left knee, lateral left shin and lateral left hip are most irritating. Pain description: Stiff Aggravating factors: Going down hill walk/stairs, transfers Relieving factors: Exercises  PRECAUTIONS: None  WEIGHT BEARING RESTRICTIONS: No  FALLS:  Has patient fallen in last 6 months? No  LIVING ENVIRONMENT: Lives in: House/apartment Stairs: flight of stairs to bedroom  OCCUPATION: Work with computer work, standing/walking.   PLOF: Independent, gym based activity, walking  PATIENT GOALS: Reduce pain   OBJECTIVE:   DIAGNOSTIC FINDINGS:  See chart   PATIENT SURVEYS:  Patient-Specific Activity Scoring Scheme  0 represents "unable to perform." 10 represents "able to perform at prior level. 0 1 2 3 4 5 6 7 8 9  10 (Date and Score)   Activity Eval  01/15/2024    1. Stairs   4    2. transfers  7    3. Sleeping  7   4. Walking down hills  4   5.    Score 5.5    Total score = sum of the activity scores/number of activities Minimum detectable change (90%CI) for average score = 2 points Minimum detectable change  (90%CI) for single activity score = 3 points  SCREENING FOR RED FLAGS: 01/15/2024 Bowel or bladder incontinence: No Cauda equina syndrome: No  COGNITION: 01/15/2024 Overall cognitive status: WFL normal      SENSATION: 01/15/2024 WFL  MUSCLE LENGTH: 01/15/2024 No specific testing in clinic today  POSTURE:  01/15/2024 Unremarkable for current condition.   PALPATION: 01/15/2024 Tenderness with trigger points noted in Lt glute med/min, lateral Lt quad, Lateral knee joint, distal IT band insertion.   LUMBAR ROM:  01/15/2024 Directional Preference Assessment: Centralization: not observed Peripheralization: not observed   AROM Eval 01/15/2024  Flexion To mid shin without complaint.   Extension 50% WFL with no change in complaints.    (Blank rows = not tested)  LOWER EXTREMITY ROM:      Right Eval 01/15/2024 Left Eval 01/15/2024  Knee flexion 108 AROM heel slide in supine 108 AROM heel slide in supine  Knee extension 0 0   (Blank rows = not tested)  LOWER EXTREMITY MMT:    MMT Right Eval 01/15/2024 Left Eval 01/15/2024  Hip flexion 5/5 5/5  Hip extension    Hip abduction 4/5 4/5  Hip adduction    Hip internal rotation    Hip external rotation    Knee flexion 5/5 5/5  Knee extension 5/5 45, 40.5 lbs 4+/5 37.4, 37.8 lbs  Ankle dorsiflexion 5/5 5/5  Ankle plantarflexion    Ankle inversion    Ankle eversion     (Blank rows = not tested)  SPECIAL TESTS:  01/15/2024 (-) slump bilaterally.   FUNCTIONAL TESTS:  01/15/2024 18 inch chair transfer s UE assist on 1st try, pain noted.  Trendelenburg in SLS assessment, Lt > Rt.  Lt SLS:  approx. 5 seconds Rt SLS : < 3 seconds   GAIT: 01/15/2024 Independent ambulation  TODAY'S  TREATMENT 02/03/24 TherAct Single limb leg press 50# 3x10; bil TRX squats 2x10 TRX lateral lunge 2x10 bil Sit to/from stand with 15# KB 2x10  TherEx Prone quad stretch 3x30 sec bil Supine hamstring stretch 3x30 sec bil Single limb knee extension 10# 2x10   02/01/24 TherAct NuStep L6 x 8 min Hooklying single limb clamshell 2x10; L5 band Bridges with isometric abduction with L5 band 5 sec hold; 2x10 Sidelying hip circles 2x10 CW/CCW; performed bil Single limb leg press 50# 2x10; LLE only Knee ext machine bil concentric; LLE eccentric 2x10; 10#     01/28/2024 Recumbent bike Seat 6 for 5 minutes Level 2 Supine IT Band stretch 5 x 20 seconds each side (emphasis on adduction and IR in supine hamstrings stretch position) Pelvic rotation 10 x 10 seconds Side-lie clams with Black Thera-band resistance 10 x bilateral Yoga Bridge 10 x 5 seconds Single knee to chest 4 x 15 seconds Seated quad sets 5 x each side Seated straight leg raises 5 x each side Hip hike 10 x 5 seconds   01/15/2024  Therex:    HEP instruction/performance c cues for techniques, handout provided.  Trial set performed of each for comprehension and symptom assessment.  See below for exercise list Additional time spent in review of handout and emphasis on routine use.    Manual Percussive device to Lt lateral hip glute med/min, Lt quad/lateral thigh.   PATIENT EDUCATION:  01/15/2024 Education details: HEP, POC Person educated: Patient Education method: Programmer, Multimedia, Demonstration, Verbal cues, and Handouts Education comprehension: verbalized understanding, returned demonstration, and verbal cues required  HOME EXERCISE PROGRAM: Access Code: [email or web address] URL: [email or web address] Date: 02/01/2024 Prepared by: Corean Ku  Exercises - Standing Lumbar Extension  - 2-4 x daily - 7 x weekly - 1 sets - 5-10 reps - Supine Lower Trunk Rotation  - 1-2 x daily - 7 x weekly - 1 sets - 3-5 reps -  15 hold - Clamshell  - 1-2 x daily - 7 x weekly - 2-3 sets - 10-15 reps - Supine Bridge  - 1-2 x daily - 7 x weekly - 1-2 sets - 10 reps - 2-5 hold - Supine Single Knee to Chest Stretch  - 1-2 x daily - 7 x weekly - 1 sets - 5 reps - 15 hold - Seated Quad Set  - 1-2 x daily - 7 x weekly - 1 sets - 10 reps - 5 hold - Seated SLR  - 1-2 x daily - 7 x weekly - 1-2 sets - 10-15 reps - 2 hold - Standing Hip Hiking  - 1-2 x daily - 7 x weekly - 1 sets - 10 reps - 5 hold - Standing Lumbar Extension at Wall - Forearms  - 5 x daily - 7 x weekly - 1 sets - 5 reps - 3 seconds hold - Supine ITB Stretch with Strap  - 2 x daily - 7 x weekly - 1 sets - 5 reps - 15 seconds hold - Hooklying Single Leg Bent Knee Fallouts with Resistance  - 1 x daily - 7 x weekly - 3 sets - 10 reps - Sidelying Hip Circles  - 2 x daily - 7 x weekly - 2 sets - 10 circles each way    ASSESSMENT:  CLINICAL IMPRESSION: Pt tolerated session well today and continued to work on strengthening.  Did add some stretches as she c/o soreness from last session. Continue skilled PT.  Patient is a 68 y.o. who comes to  clinic with complaints of pain both legs Lt > Rt pain with mobility, strength and movement coordination deficits that impair their ability to perform usual daily and recreational functional activities without increase difficulty/symptoms at this time.  Patient to benefit from skilled PT services to address impairments and limitations to improve to previous level of function without restriction secondary to condition.   OBJECTIVE IMPAIRMENTS: Abnormal gait, decreased activity tolerance, decreased balance, decreased coordination, decreased endurance, decreased mobility, difficulty walking, decreased ROM, decreased strength, increased fascial restrictions, impaired perceived functional ability, increased muscle spasms, impaired flexibility, improper body mechanics, and pain.   ACTIVITY LIMITATIONS: bending, sitting, standing, squatting,  sleeping, stairs, transfers, bed mobility, and locomotion level  PARTICIPATION LIMITATIONS: meal prep, cleaning, laundry, interpersonal relationship, shopping, and community activity  PERSONAL FACTORS: Time since onset of injury/illness/exacerbation and PMH significant for anxiety, OA, DM, hypothyroidism, and Lt TKA 08/21/22.  Rt TKA are also affecting patient's functional outcome.   REHAB POTENTIAL: Good  CLINICAL DECISION MAKING: Evolving/moderate complexity  EVALUATION COMPLEXITY: Moderate   GOALS: Goals reviewed with patient? Yes  SHORT TERM GOALS: (target date for Short term goals are 3 weeks 02/15/2024)  1. Patient will demonstrate independent use of home exercise program to maintain progress from in clinic treatments.  Goal status: Ongoing 01/28/2024  LONG TERM GOALS: (target dates for all long term goals are 10 weeks  03/25/2024 )   1. Patient will demonstrate/report pain at worst less than or equal to 2/10 to facilitate minimal limitation in daily activity secondary to pain symptoms.  Goal status: New   2. Patient will demonstrate independent use of home exercise program to facilitate ability to maintain/progress functional gains from skilled physical therapy services.  Goal status: New   3. Patient will demonstrate Patient specific functional scale avg > or = 8/10 to indicate reduced disability due to condition.   Goal status: New   4. Patient will demonstrate lumbar extension 100 % WFL s symptoms to facilitate upright standing, walking posture at PLOF s limitation.  Goal status: New   5.  Patient will demonstrate bilateral hip MMT 5/5, knee extension dynamometry improvement > 15 lbs to facilitate stairs, incline/decline walking and other daily activity at PLOF.   Goal status: New   6.  Patient will demonstrate SLS bilaterally > 10 seconds to facilitate stability in ambulation.  Goal status: New   7.  Patient will demonstrate ascending/descending stairs  reciprocally s UE assist for community integration.   Goal Status: New  PLAN:  PT FREQUENCY: 1-2x/week  PT DURATION: 10 weeks  PLANNED INTERVENTIONS: Can include 02853- PT Re-evaluation, 97110-Therapeutic exercises, 97530- Therapeutic activity, [id number]- Neuromuscular re-education, 97535- Self Care, 97140- Manual therapy, [phone]- Gait training, [phone]- Orthotic Fit/training, [phone]- Canalith repositioning, [id number]- Aquatic Therapy, [phone]- Electrical stimulation (unattended), [id number] Physical performance testing, 97016- Vasopneumatic device, [id number]- Ultrasound, [id number]- Traction (mechanical), [id number]- Ionotophoresis 4mg /ml Dexamethasone ,  79439 - Needle insertion w/o injection 1 or 2 muscles, 20561 - Needle insertion w/o injection 3 or more muscles.    Patient/Family education, Balance training, Stair training, Taping, Dry Needling, Joint mobilization, Joint manipulation, Spinal manipulation, Spinal mobilization, Scar mobilization, Vestibular training, Visual/preceptual remediation/compensation, DME instructions, Cryotherapy, and Moist heat.  All performed as medically necessary.  All included unless contraindicated  PLAN FOR NEXT SESSION:  check objective measures,  Needs continued strengthening for lateral/posterior hip, quads.  Percussive device as indicated/tolerated.     Corean JULIANNA Ku, PT, DPT 02/03/24 12:44 PM

## 2024-02-08 ENCOUNTER — Ambulatory Visit: Admitting: Rehabilitative and Restorative Service Providers"

## 2024-02-08 ENCOUNTER — Encounter: Payer: Self-pay | Admitting: Rehabilitative and Restorative Service Providers"

## 2024-02-08 DIAGNOSIS — M6281 Muscle weakness (generalized): Secondary | ICD-10-CM

## 2024-02-08 DIAGNOSIS — M79605 Pain in left leg: Secondary | ICD-10-CM

## 2024-02-08 DIAGNOSIS — M5459 Other low back pain: Secondary | ICD-10-CM

## 2024-02-08 DIAGNOSIS — R262 Difficulty in walking, not elsewhere classified: Secondary | ICD-10-CM

## 2024-02-08 DIAGNOSIS — M79604 Pain in right leg: Secondary | ICD-10-CM

## 2024-02-08 NOTE — Therapy (Signed)
 OUTPATIENT PHYSICAL THERAPY TREATMENT   Patient Name: Krystal Lang MRN: [id number] DOB:11-Sep-1955, 68 y.o., female Today's Date: 02/08/2024  END OF SESSION:  PT End of Session - 02/08/24 1200     Visit Number 5    Number of Visits 20    Date for Recertification  03/25/24    Authorization Type Medicare    Progress Note Due on Visit 10    PT Start Time 1146    PT Stop Time 1226    PT Time Calculation (min) 40 min    Activity Tolerance Patient tolerated treatment well;No increased pain    Behavior During Therapy WFL for tasks assessed/performed              Past Medical History:  Diagnosis Date   Anxiety    Arthritis    Cataract    Mixed form OU   Complication of anesthesia    after knee arthroscopy pateint difficult to wake up   Diabetes mellitus    Fibroid uterus 2001   H/O fatigue 1998   Headache(784.0)    denies   Hepatitis    A, 30 years ago   Hirsutism 1998   Idiopathic   History of chicken pox    Hypothyroidism    Menses, irregular 1998   Past Surgical History:  Procedure Laterality Date   COLONOSCOPY  02/2021   DILATION AND CURETTAGE OF UTERUS  03/31/2000   [place]   KNEE ARTHROPLASTY Right    Replaced in [place], [place]   KNEE ARTHROSCOPY Right 2008   TONSILLECTOMY     As a child   TOTAL KNEE ARTHROPLASTY Left 08/21/2022   Procedure: LEFT TOTAL KNEE ARTHROPLASTY;  Surgeon: Vernetta Lonni GRADE, MD;  Location: [facility];  Service: Orthopedics;  Laterality: Left;   Patient Active Problem List   Diagnosis Date Noted   Status post total left knee replacement 08/21/2022   Dyspareunia, female 11/13/2011   Type 2 diabetes mellitus (HCC) 11/13/2011   Alopecia 11/13/2011   History of chicken pox    Headache    Hypothyroidism    Menses, irregular    Hirsutism    H/O fatigue    Fibroid uterus     PCP: Negri Pacific, MD  REFERRING PROVIDER: Vernetta Lonni GRADE, MD  REFERRING DIAG: M54.50,G89.29 (ICD-10-CM) - Chronic left-sided low back  pain without sciatica  Rationale for Evaluation and Treatment: Rehabilitation  THERAPY DIAG:  Pain in left leg  Pain in right leg  Other low back pain  Muscle weakness (generalized)  Difficulty in walking, not elsewhere classified  ONSET DATE: 2024 chronic   SUBJECTIVE:  SUBJECTIVE STATEMENT: Pt indicated feeling like getting some looser in knees.  Reported some complaints in back of Rt hip/back with walking longer.   PERTINENT HISTORY:  PMH significant for anxiety, OA, DM, hypothyroidism, and Lt TKA 08/21/22. Rt TKA   PAIN:  NPRS scale:  this morning:  3/10 Pain location: Posterior left knee, lateral left shin and lateral left hip are most irritating. Pain description: Stiff Aggravating factors: Going down hill walk/stairs, transfers Relieving factors: Exercises  PRECAUTIONS: None  WEIGHT BEARING RESTRICTIONS: No  FALLS:  Has patient fallen in last 6 months? No  LIVING ENVIRONMENT: Lives in: House/apartment Stairs: flight of stairs to bedroom  OCCUPATION: Work with computer work, standing/walking.   PLOF: Independent, gym based activity, walking  PATIENT GOALS: Reduce pain   OBJECTIVE:   DIAGNOSTIC FINDINGS:  See chart   PATIENT SURVEYS:  Patient-Specific Activity Scoring Scheme  0 represents "unable to perform." 10 represents "able to perform at prior level. 0 1 2 3 4 5 6 7 8 9  10 (Date and Score)   Activity Eval  01/15/2024    1. Stairs   4    2. transfers  7    3. Sleeping  7   4. Walking down hills  4   5.    Score 5.5    Total score = sum of the activity scores/number of activities Minimum detectable change (90%CI) for average score = 2 points Minimum detectable change (90%CI) for single activity score = 3 points  SCREENING FOR RED  FLAGS: 01/15/2024 Bowel or bladder incontinence: No Cauda equina syndrome: No  COGNITION: 01/15/2024 Overall cognitive status: WFL normal      SENSATION: 01/15/2024 WFL  MUSCLE LENGTH: 01/15/2024 No specific testing in clinic today  POSTURE:  01/15/2024 Unremarkable for current condition.   PALPATION: 01/15/2024 Tenderness with trigger points noted in Lt glute med/min, lateral Lt quad, Lateral knee joint, distal IT band insertion.   LUMBAR ROM:  01/15/2024 Directional Preference Assessment: Centralization: not observed Peripheralization: not observed   AROM Eval 01/15/2024  Flexion To mid shin without complaint.   Extension 50% WFL with no change in complaints.    (Blank rows = not tested)  LOWER EXTREMITY ROM:      Right Eval 01/15/2024 Left Eval 01/15/2024  Knee flexion 108 AROM heel slide in supine 108 AROM heel slide in supine  Knee extension 0 0   (Blank rows = not tested)  LOWER EXTREMITY MMT:    MMT Right Eval 01/15/2024 Left Eval 01/15/2024 Right 02/08/2024 Left 02/08/2024  Hip flexion 5/5 5/5    Hip extension      Hip abduction 4/5 4/5 4+/5 4+/5  Hip adduction      Hip internal rotation      Hip external rotation      Knee flexion 5/5 5/5    Knee extension 5/5 45, 40.5 lbs 4+/5 37.4, 37.8 lbs 5/5 44, 43.5  lbs  4+/5 38.3, 37.5 lbs  Ankle dorsiflexion 5/5 5/5    Ankle plantarflexion      Ankle inversion      Ankle eversion       (Blank rows = not tested)  SPECIAL TESTS:  01/15/2024 (-) slump bilaterally.   FUNCTIONAL TESTS:  01/15/2024 18 inch chair transfer s UE assist on 1st try, pain noted.  Trendelenburg in SLS assessment, Lt > Rt.  Lt SLS:  approx. 5 seconds Rt SLS : < 3 seconds   GAIT: 01/15/2024 Independent ambulation  TODAY'S TREATMENT      DATE: 02/08/2024 Therex: Recumbent bike seat 5 11 mins for ROM Incilne gastroc stretch 30 sec x 3 bilateral  Supine bridge 2-3 sec hold with progressive heel slide walk up for knee motion.  Performed 4 x 5  TherActivity (to improve stairs, squatting, transfers) Lateral step down 6 inch with slow lowering focus 2 x 10 bilateral  Step on over and down 4 inch step x 15 bilaterally with slow lowering focus.   Manual Percussive device to Rt glute max, min , Rt QL.  Lt lateral thigh/quad middle and distal 1/3s.   TODAY'S TREATMENT      DATE: 02/03/24 TherAct Single limb leg press 50# 3x10; bil TRX squats 2x10 TRX lateral lunge 2x10 bil Sit to/from stand with 15# KB 2x10  TherEx Prone quad stretch 3x30 sec bil Supine hamstring stretch 3x30 sec bil Single limb knee extension 10# 2x10   TODAY'S TREATMENT      DATE:02/01/24 TherAct NuStep L6 x 8 min Hooklying single limb clamshell 2x10; L5 band Bridges with isometric abduction with L5 band 5 sec hold; 2x10 Sidelying hip circles 2x10 CW/CCW; performed bil Single limb leg press 50# 2x10; LLE only Knee ext machine bil concentric; LLE eccentric 2x10; 10#     TODAY'S TREATMENT      DATE:01/28/2024 Recumbent bike Seat 6 for 5 minutes Level 2 Supine IT Band stretch 5 x 20 seconds each side (emphasis on adduction and IR in supine hamstrings stretch position) Pelvic rotation 10 x 10 seconds Side-lie clams with Black Thera-band resistance 10 x bilateral Yoga Bridge 10 x 5 seconds Single knee to chest 4 x 15 seconds Seated quad sets 5 x each side Seated straight leg raises 5 x each side Hip hike 10 x 5 seconds    PATIENT EDUCATION:  01/15/2024 Education details: HEP, POC Person educated: Patient Education method: Programmer, Multimedia, Facilities Manager, Verbal cues, and Handouts Education comprehension: verbalized understanding, returned demonstration, and verbal cues required  HOME EXERCISE PROGRAM: Access  Code: [email or web address] URL: [email or web address] Date: 02/01/2024 Prepared by: Corean Ku  Exercises - Standing Lumbar Extension  - 2-4 x daily - 7 x weekly - 1 sets - 5-10 reps - Supine Lower Trunk Rotation  - 1-2 x daily - 7 x weekly - 1 sets - 3-5 reps - 15 hold - Clamshell  - 1-2 x daily - 7 x weekly - 2-3 sets - 10-15 reps - Supine Bridge  - 1-2 x daily - 7 x weekly - 1-2 sets - 10 reps - 2-5 hold - Supine Single Knee to Chest Stretch  - 1-2 x daily - 7 x weekly - 1 sets - 5 reps - 15 hold - Seated Quad Set  - 1-2 x daily - 7 x weekly - 1 sets - 10 reps - 5 hold - Seated SLR  - 1-2 x daily - 7 x weekly - 1-2 sets - 10-15 reps - 2 hold - Standing Hip Hiking  - 1-2 x daily - 7 x weekly - 1 sets - 10 reps - 5 hold - Standing Lumbar Extension at Wall - Forearms  - 5 x daily - 7 x weekly - 1 sets - 5 reps - 3 seconds hold - Supine ITB Stretch with Strap  - 2 x daily - 7 x weekly - 1 sets - 5 reps - 15 seconds hold - Hooklying Single Leg Bent Knee Fallouts with Resistance  - 1 x daily - 7 x weekly - 3 sets -  10 reps - Sidelying Hip Circles  - 2 x daily - 7 x weekly - 2 sets - 10 circles each way    ASSESSMENT:  CLINICAL IMPRESSION: Strength testing showed very mild changes compared to eval for both.  Continued focus on functional strength gains for quads to help address ambulation, stairs, decline/incline ambulation complaints.  Continued skilled PT services indicated at this time.   OBJECTIVE IMPAIRMENTS: Abnormal gait, decreased activity tolerance, decreased balance, decreased coordination, decreased endurance, decreased mobility, difficulty walking, decreased ROM, decreased strength, increased fascial restrictions, impaired perceived functional ability, increased muscle spasms, impaired flexibility, improper body mechanics, and pain.   ACTIVITY LIMITATIONS: bending, sitting, standing, squatting, sleeping, stairs, transfers, bed mobility, and locomotion  level  PARTICIPATION LIMITATIONS: meal prep, cleaning, laundry, interpersonal relationship, shopping, and community activity  PERSONAL FACTORS: Time since onset of injury/illness/exacerbation and PMH significant for anxiety, OA, DM, hypothyroidism, and Lt TKA 08/21/22.  Rt TKA are also affecting patient's functional outcome.   REHAB POTENTIAL: Good  CLINICAL DECISION MAKING: Evolving/moderate complexity  EVALUATION COMPLEXITY: Moderate   GOALS: Goals reviewed with patient? Yes  SHORT TERM GOALS: (target date for Short term goals are 3 weeks 02/15/2024)  1. Patient will demonstrate independent use of home exercise program to maintain progress from in clinic treatments.  Goal status: Ongoing 02/08/2024  LONG TERM GOALS: (target dates for all long term goals are 10 weeks  03/25/2024 )   1. Patient will demonstrate/report pain at worst less than or equal to 2/10 to facilitate minimal limitation in daily activity secondary to pain symptoms.  Goal status: Ongoing 02/08/2024   2. Patient will demonstrate independent use of home exercise program to facilitate ability to maintain/progress functional gains from skilled physical therapy services.  Goal status: Ongoing 02/08/2024   3. Patient will demonstrate Patient specific functional scale avg > or = 8/10 to indicate reduced disability due to condition.   Goal status: Ongoing 02/08/2024   4. Patient will demonstrate lumbar extension 100 % WFL s symptoms to facilitate upright standing, walking posture at PLOF s limitation.  Goal status: Ongoing 02/08/2024   5.  Patient will demonstrate bilateral hip MMT 5/5, knee extension dynamometry improvement > 15 lbs to facilitate stairs, incline/decline walking and other daily activity at PLOF.   Goal status: Ongoing 02/08/2024   6.  Patient will demonstrate SLS bilaterally > 10 seconds to facilitate stability in ambulation.  Goal status: Ongoing 02/08/2024   7.  Patient will demonstrate  ascending/descending stairs reciprocally s UE assist for community integration.   Goal Status: Ongoing 02/08/2024  PLAN:  PT FREQUENCY: 1-2x/week  PT DURATION: 10 weeks  PLANNED INTERVENTIONS: Can include 02853- PT Re-evaluation, 97110-Therapeutic exercises, 97530- Therapeutic activity, 97112- Neuromuscular re-education, 97535- Self Care, 97140- Manual therapy, [phone]- Gait training, [phone]- Orthotic Fit/training, [phone]- Canalith repositioning, [id number]- Aquatic Therapy, [phone]- Electrical stimulation (unattended), [id number] Physical performance testing, [id number]- Vasopneumatic device, [id number]- Ultrasound, [id number]- Traction (mechanical), [id number]- Ionotophoresis 4mg /ml Dexamethasone ,  [id number] - Needle insertion w/o injection 1 or 2 muscles, 20561 - Needle insertion w/o injection 3 or more muscles.    Patient/Family education, Balance training, Stair training, Taping, Dry Needling, Joint mobilization, Joint manipulation, Spinal manipulation, Spinal mobilization, Scar mobilization, Vestibular training, Visual/preceptual remediation/compensation, DME instructions, Cryotherapy, and Moist heat.  All performed as medically necessary.  All included unless contraindicated  PLAN FOR NEXT SESSION: Strengthening progressions.    Ozell Silvan, PT, DPT, OCS, ATC 02/08/24  12:28 PM

## 2024-02-10 ENCOUNTER — Encounter: Payer: Self-pay | Admitting: Rehabilitative and Restorative Service Providers"

## 2024-02-10 ENCOUNTER — Ambulatory Visit (INDEPENDENT_AMBULATORY_CARE_PROVIDER_SITE_OTHER): Admitting: Orthopaedic Surgery

## 2024-02-10 ENCOUNTER — Encounter: Payer: Self-pay | Admitting: Orthopaedic Surgery

## 2024-02-10 ENCOUNTER — Ambulatory Visit: Admitting: Rehabilitative and Restorative Service Providers"

## 2024-02-10 DIAGNOSIS — Z96652 Presence of left artificial knee joint: Secondary | ICD-10-CM | POA: Diagnosis not present

## 2024-02-10 DIAGNOSIS — M6281 Muscle weakness (generalized): Secondary | ICD-10-CM | POA: Diagnosis not present

## 2024-02-10 DIAGNOSIS — M79604 Pain in right leg: Secondary | ICD-10-CM

## 2024-02-10 DIAGNOSIS — M79605 Pain in left leg: Secondary | ICD-10-CM

## 2024-02-10 DIAGNOSIS — M5459 Other low back pain: Secondary | ICD-10-CM

## 2024-02-10 DIAGNOSIS — R262 Difficulty in walking, not elsewhere classified: Secondary | ICD-10-CM

## 2024-02-10 NOTE — Therapy (Signed)
 OUTPATIENT PHYSICAL THERAPY TREATMENT   Patient Name: Krystal Lang MRN: [id number] DOB:1955/10/12, 68 y.o., female Today's Date: 02/10/2024  END OF SESSION:  PT End of Session - 02/10/24 1154     Visit Number 6    Number of Visits 20    Date for Recertification  03/25/24    Authorization Type Medicare    Progress Note Due on Visit 10    PT Start Time 1150    PT Stop Time 1228    PT Time Calculation (min) 38 min    Activity Tolerance Patient tolerated treatment well    Behavior During Therapy WFL for tasks assessed/performed               Past Medical History:  Diagnosis Date   Anxiety    Arthritis    Cataract    Mixed form OU   Complication of anesthesia    after knee arthroscopy pateint difficult to wake up   Diabetes mellitus    Fibroid uterus 2001   H/O fatigue 1998   Headache(784.0)    denies   Hepatitis    A, 30 years ago   Hirsutism 1998   Idiopathic   History of chicken pox    Hypothyroidism    Menses, irregular 1998   Past Surgical History:  Procedure Laterality Date   COLONOSCOPY  02/2021   DILATION AND CURETTAGE OF UTERUS  03/31/2000   In [place]   KNEE ARTHROPLASTY Right    Replaced in [place], [place]   KNEE ARTHROSCOPY Right 2008   TONSILLECTOMY     As a child   TOTAL KNEE ARTHROPLASTY Left 08/21/2022   Procedure: LEFT TOTAL KNEE ARTHROPLASTY;  Surgeon: Vernetta Lonni GRADE, MD;  Location: [facility];  Service: Orthopedics;  Laterality: Left;   Patient Active Problem List   Diagnosis Date Noted   Status post total left knee replacement 08/21/2022   Dyspareunia, female 11/13/2011   Type 2 diabetes mellitus (HCC) 11/13/2011   Alopecia 11/13/2011   History of chicken pox    Headache    Hypothyroidism    Menses, irregular    Hirsutism    H/O fatigue    Fibroid uterus     PCP: Negri Pacific, MD  REFERRING PROVIDER: Vernetta Lonni GRADE, MD  REFERRING DIAG: M54.50,G89.29 (ICD-10-CM) - Chronic left-sided low back pain without  sciatica  Rationale for Evaluation and Treatment: Rehabilitation  THERAPY DIAG:  Pain in left leg  Pain in right leg  Other low back pain  Muscle weakness (generalized)  Difficulty in walking, not elsewhere classified  ONSET DATE: 2024 chronic   SUBJECTIVE:  SUBJECTIVE STATEMENT: Pt reported a light level discomfort/pain in distal quad to patellar connection in knee extension but reduced upon rest and no worsening.   PERTINENT HISTORY:  PMH significant for anxiety, OA, DM, hypothyroidism, and Lt TKA 08/21/22. Rt TKA   PAIN:  NPRS scale:  this morning:  3/10 Pain location: Posterior left knee, lateral left shin and lateral left hip are most irritating. Pain description: Stiff Aggravating factors: Going down hill walk/stairs, transfers Relieving factors: Exercises  PRECAUTIONS: None  WEIGHT BEARING RESTRICTIONS: No  FALLS:  Has patient fallen in last 6 months? No  LIVING ENVIRONMENT: Lives in: House/apartment Stairs: flight of stairs to bedroom  OCCUPATION: Work with computer work, standing/walking.   PLOF: Independent, gym based activity, walking  PATIENT GOALS: Reduce pain   OBJECTIVE:   DIAGNOSTIC FINDINGS:  See chart   PATIENT SURVEYS:  Patient-Specific Activity Scoring Scheme  0 represents "unable to perform." 10 represents "able to perform at prior level. 0 1 2 3 4 5 6 7 8 9  10 (Date and Score)   Activity Eval  01/15/2024    1. Stairs   4    2. transfers  7    3. Sleeping  7   4. Walking down hills  4   5.    Score 5.5    Total score = sum of the activity scores/number of activities Minimum detectable change (90%CI) for average score = 2 points Minimum detectable change (90%CI) for single activity score = 3 points  SCREENING FOR RED  FLAGS: 01/15/2024 Bowel or bladder incontinence: No Cauda equina syndrome: No  COGNITION: 01/15/2024 Overall cognitive status: WFL normal      SENSATION: 01/15/2024 WFL  MUSCLE LENGTH: 01/15/2024 No specific testing in clinic today  POSTURE:  01/15/2024 Unremarkable for current condition.   PALPATION: 01/15/2024 Tenderness with trigger points noted in Lt glute med/min, lateral Lt quad, Lateral knee joint, distal IT band insertion.   LUMBAR ROM:  01/15/2024 Directional Preference Assessment: Centralization: not observed Peripheralization: not observed   AROM Eval 01/15/2024  Flexion To mid shin without complaint.   Extension 50% WFL with no change in complaints.    (Blank rows = not tested)  LOWER EXTREMITY ROM:      Right Eval 01/15/2024 Left Eval 01/15/2024  Knee flexion 108 AROM heel slide in supine 108 AROM heel slide in supine  Knee extension 0 0   (Blank rows = not tested)  LOWER EXTREMITY MMT:    MMT Right Eval 01/15/2024 Left Eval 01/15/2024 Right 02/08/2024 Left 02/08/2024  Hip flexion 5/5 5/5    Hip extension      Hip abduction 4/5 4/5 4+/5 4+/5  Hip adduction      Hip internal rotation      Hip external rotation      Knee flexion 5/5 5/5    Knee extension 5/5 45, 40.5 lbs 4+/5 37.4, 37.8 lbs 5/5 44, 43.5  lbs  4+/5 38.3, 37.5 lbs  Ankle dorsiflexion 5/5 5/5    Ankle plantarflexion      Ankle inversion      Ankle eversion       (Blank rows = not tested)  SPECIAL TESTS:  01/15/2024 (-) slump bilaterally.   FUNCTIONAL TESTS:  01/15/2024 18 inch chair transfer s UE assist on 1st try, pain noted.  Trendelenburg in SLS assessment, Lt > Rt.  Lt SLS:  approx. 5 seconds Rt SLS : < 3 seconds   GAIT: 01/15/2024 Independent ambulation  TODAY'S TREATMENT      DATE: 02/10/2024 Therex: Knee extension machine double leg up, single leg lowering slowly 15 lbs 2 x 10 bilaterally  Knee flexion machine single leg 2 x 10 bilaterally , 15 lbs  Recumbent bike seat 5, goal of keeping machine on at 20 rpm or higher,  mins for ROM Standing hip abduction green band 2 x 10 bilaterally  Lateral stepping 10 ft x 5 each way with green band  Incline gastroc stretch 30 sec x 3 bilateral  Seated SLR with quad set with DF x 10 slow movement focus, performed bilaterally.     TODAY'S TREATMENT      DATE: 02/08/2024 Therex: Recumbent bike seat 5 11 mins for ROM Incilne gastroc stretch 30 sec x 3 bilateral  Supine bridge 2-3 sec hold with progressive heel slide walk up for knee motion.  Performed 4 x 5  TherActivity (to improve stairs, squatting, transfers) Lateral step down 6 inch with slow lowering focus 2 x 10 bilateral  Step on over and down 4 inch step x 15 bilaterally with slow lowering focus.   Manual Percussive device to Rt glute max, min , Rt QL.  Lt lateral thigh/quad middle and distal 1/3s.   TODAY'S TREATMENT      DATE: 02/03/24 TherAct Single limb leg press 50# 3x10; bil TRX squats 2x10 TRX lateral lunge 2x10 bil Sit to/from stand with 15# KB 2x10  TherEx Prone quad stretch 3x30 sec bil Supine hamstring stretch 3x30 sec bil Single limb knee extension 10# 2x10   TODAY'S TREATMENT      DATE:02/01/24 TherAct NuStep L6 x 8 min Hooklying single limb clamshell 2x10; L5 band Bridges with isometric abduction with L5 band 5 sec hold; 2x10 Sidelying hip circles 2x10 CW/CCW; performed bil Single limb leg press 50# 2x10; LLE only Knee ext machine bil concentric; LLE eccentric 2x10; 10#     TODAY'S TREATMENT      DATE:01/28/2024 Recumbent bike Seat 6 for 5 minutes Level 2 Supine IT Band stretch 5 x 20 seconds each side (emphasis on adduction and IR in supine hamstrings stretch position) Pelvic rotation 10 x 10  seconds Side-lie clams with Black Thera-band resistance 10 x bilateral Yoga Bridge 10 x 5 seconds Single knee to chest 4 x 15 seconds Seated quad sets 5 x each side Seated straight leg raises 5 x each side Hip hike 10 x 5 seconds    PATIENT EDUCATION:  01/15/2024 Education details: HEP, POC Person educated: Patient Education method: Programmer, Multimedia, Facilities Manager, Verbal cues, and Handouts Education comprehension: verbalized understanding, returned demonstration, and verbal cues required  HOME EXERCISE PROGRAM: Access Code: [email or web address] URL: [email or web address] Date: 02/01/2024 Prepared by: Corean Ku  Exercises - Standing Lumbar Extension  - 2-4 x daily - 7 x weekly - 1 sets - 5-10 reps - Supine Lower Trunk Rotation  - 1-2 x daily - 7 x weekly - 1 sets - 3-5 reps - 15 hold - Clamshell  - 1-2 x daily - 7 x weekly - 2-3 sets - 10-15 reps - Supine Bridge  - 1-2 x daily - 7 x weekly - 1-2 sets - 10 reps - 2-5 hold - Supine Single Knee to Chest Stretch  - 1-2 x daily - 7 x weekly - 1 sets - 5 reps - 15 hold - Seated Quad Set  - 1-2 x daily - 7 x weekly - 1 sets - 10 reps - 5 hold - Seated SLR  - 1-2 x daily - 7  x weekly - 1-2 sets - 10-15 reps - 2 hold - Standing Hip Hiking  - 1-2 x daily - 7 x weekly - 1 sets - 10 reps - 5 hold - Standing Lumbar Extension at Wall - Forearms  - 5 x daily - 7 x weekly - 1 sets - 5 reps - 3 seconds hold - Supine ITB Stretch with Strap  - 2 x daily - 7 x weekly - 1 sets - 5 reps - 15 seconds hold - Hooklying Single Leg Bent Knee Fallouts with Resistance  - 1 x daily - 7 x weekly - 3 sets - 10 reps - Sidelying Hip Circles  - 2 x daily - 7 x weekly - 2 sets - 10 circles each way    ASSESSMENT:  CLINICAL IMPRESSION: Pt has reported reduced complaints in hip/back over time, noting some improvement. Emphasis continued on progressive hip and quad strengthening to continue to make progress towards goals established.  Continued skilled PT  services indicated at this time.    OBJECTIVE IMPAIRMENTS: Abnormal gait, decreased activity tolerance, decreased balance, decreased coordination, decreased endurance, decreased mobility, difficulty walking, decreased ROM, decreased strength, increased fascial restrictions, impaired perceived functional ability, increased muscle spasms, impaired flexibility, improper body mechanics, and pain.   ACTIVITY LIMITATIONS: bending, sitting, standing, squatting, sleeping, stairs, transfers, bed mobility, and locomotion level  PARTICIPATION LIMITATIONS: meal prep, cleaning, laundry, interpersonal relationship, shopping, and community activity  PERSONAL FACTORS: Time since onset of injury/illness/exacerbation and PMH significant for anxiety, OA, DM, hypothyroidism, and Lt TKA 08/21/22.  Rt TKA are also affecting patient's functional outcome.   REHAB POTENTIAL: Good  CLINICAL DECISION MAKING: Evolving/moderate complexity  EVALUATION COMPLEXITY: Moderate   GOALS: Goals reviewed with patient? Yes  SHORT TERM GOALS: (target date for Short term goals are 3 weeks 02/15/2024)  1. Patient will demonstrate independent use of home exercise program to maintain progress from in clinic treatments.  Goal status: Ongoing 02/08/2024  LONG TERM GOALS: (target dates for all long term goals are 10 weeks  03/25/2024 )   1. Patient will demonstrate/report pain at worst less than or equal to 2/10 to facilitate minimal limitation in daily activity secondary to pain symptoms.  Goal status: Ongoing 02/08/2024   2. Patient will demonstrate independent use of home exercise program to facilitate ability to maintain/progress functional gains from skilled physical therapy services.  Goal status: Ongoing 02/08/2024   3. Patient will demonstrate Patient specific functional scale avg > or = 8/10 to indicate reduced disability due to condition.   Goal status: Ongoing 02/08/2024   4. Patient will demonstrate lumbar  extension 100 % WFL s symptoms to facilitate upright standing, walking posture at PLOF s limitation.  Goal status: Ongoing 02/08/2024   5.  Patient will demonstrate bilateral hip MMT 5/5, knee extension dynamometry improvement > 15 lbs to facilitate stairs, incline/decline walking and other daily activity at PLOF.   Goal status: Ongoing 02/08/2024   6.  Patient will demonstrate SLS bilaterally > 10 seconds to facilitate stability in ambulation.  Goal status: Ongoing 02/08/2024   7.  Patient will demonstrate ascending/descending stairs reciprocally s UE assist for community integration.   Goal Status: Ongoing 02/08/2024  PLAN:  PT FREQUENCY: 1-2x/week  PT DURATION: 10 weeks  PLANNED INTERVENTIONS: Can include 02853- PT Re-evaluation, 97110-Therapeutic exercises, 97530- Therapeutic activity, [id number]- Neuromuscular re-education, 97535- Self Care, 97140- Manual therapy, [phone]- Gait training, [phone]- Orthotic Fit/training, [phone]- Canalith repositioning, [id number]- Aquatic Therapy, [phone]- Electrical stimulation (unattended), [id number] Physical performance testing,  02983- Vasopneumatic device, [id number]- Ultrasound, 02987- Traction (mechanical), [id number]- Ionotophoresis 4mg /ml Dexamethasone ,  79439 - Needle insertion w/o injection 1 or 2 muscles, 20561 - Needle insertion w/o injection 3 or more muscles.    Patient/Family education, Balance training, Stair training, Taping, Dry Needling, Joint mobilization, Joint manipulation, Spinal manipulation, Spinal mobilization, Scar mobilization, Vestibular training, Visual/preceptual remediation/compensation, DME instructions, Cryotherapy, and Moist heat.  All performed as medically necessary.  All included unless contraindicated  PLAN FOR NEXT SESSION:  PSFS reassessment (approx. 30 days since eval) Continued strength gains.   Ozell Silvan, PT, DPT, OCS, ATC 02/10/24  12:28 PM

## 2024-02-10 NOTE — Progress Notes (Signed)
 The patient comes in today with for continued follow-up.  She has been in physical therapy and had a session today.  They are working on her knees at the IT band specially on the left knee and the trochanteric area on the left side.  She is making progress.  She is 77 and active.  She says that she has a hard time going up and down stairs.  She has had both of her knees replaced and the right knee has been stronger from a quad standpoint of the knee that we replaced in May 2024 on the left side.  She is walking without any significant limp and without assistive device.  There is some pain of the trochanteric area of her left hip and pain over the IT band of her left knee.  Overall though there is no knee joint effusion and her knees are stable with good range of motion.  She will continue to work on activities to strengthen her quad muscles and give this time.  There is nothing else I would really recommend for her other than PT and time in a home exercise program.  We can certainly see her back in 6 months to see how she is doing overall but no x-rays are needed unless there are issues.

## 2024-02-15 ENCOUNTER — Encounter: Payer: Self-pay | Admitting: Rehabilitative and Restorative Service Providers"

## 2024-02-15 ENCOUNTER — Ambulatory Visit (INDEPENDENT_AMBULATORY_CARE_PROVIDER_SITE_OTHER): Admitting: Rehabilitative and Restorative Service Providers"

## 2024-02-15 DIAGNOSIS — M79604 Pain in right leg: Secondary | ICD-10-CM

## 2024-02-15 DIAGNOSIS — M6281 Muscle weakness (generalized): Secondary | ICD-10-CM

## 2024-02-15 DIAGNOSIS — M79605 Pain in left leg: Secondary | ICD-10-CM | POA: Diagnosis not present

## 2024-02-15 DIAGNOSIS — M5459 Other low back pain: Secondary | ICD-10-CM

## 2024-02-15 DIAGNOSIS — R262 Difficulty in walking, not elsewhere classified: Secondary | ICD-10-CM

## 2024-02-15 NOTE — Therapy (Signed)
 OUTPATIENT PHYSICAL THERAPY TREATMENT   Patient Name: Krystal Lang MRN: [id number] DOB:02/05/1956, 68 y.o., female Today's Date: 02/15/2024  END OF SESSION:  PT End of Session - 02/15/24 1152     Visit Number 7    Number of Visits 20    Date for Recertification  03/25/24    Authorization Type Medicare    Progress Note Due on Visit 10    PT Start Time 1149    PT Stop Time 1228    PT Time Calculation (min) 39 min    Activity Tolerance Patient tolerated treatment well    Behavior During Therapy WFL for tasks assessed/performed                Past Medical History:  Diagnosis Date   Anxiety    Arthritis    Cataract    Mixed form OU   Complication of anesthesia    after knee arthroscopy pateint difficult to wake up   Diabetes mellitus    Fibroid uterus 2001   H/O fatigue 1998   Headache(784.0)    denies   Hepatitis    A, 30 years ago   Hirsutism 1998   Idiopathic   History of chicken pox    Hypothyroidism    Menses, irregular 1998   Past Surgical History:  Procedure Laterality Date   COLONOSCOPY  02/2021   DILATION AND CURETTAGE OF UTERUS  03/31/2000   In [place]   KNEE ARTHROPLASTY Right    Replaced in [place], [place]   KNEE ARTHROSCOPY Right 2008   TONSILLECTOMY     As a child   TOTAL KNEE ARTHROPLASTY Left 08/21/2022   Procedure: LEFT TOTAL KNEE ARTHROPLASTY;  Surgeon: Vernetta Lonni GRADE, MD;  Location: [facility];  Service: Orthopedics;  Laterality: Left;   Patient Active Problem List   Diagnosis Date Noted   Status post total left knee replacement 08/21/2022   Dyspareunia, female 11/13/2011   Type 2 diabetes mellitus (HCC) 11/13/2011   Alopecia 11/13/2011   History of chicken pox    Headache    Hypothyroidism    Menses, irregular    Hirsutism    H/O fatigue    Fibroid uterus     PCP: Negri Pacific, MD  REFERRING PROVIDER: Vernetta Lonni GRADE, MD  REFERRING DIAG: M54.50,G89.29 (ICD-10-CM) - Chronic left-sided low back pain without  sciatica  Rationale for Evaluation and Treatment: Rehabilitation  THERAPY DIAG:  Pain in left leg  Other low back pain  Pain in right leg  Muscle weakness (generalized)  Difficulty in walking, not elsewhere classified  ONSET DATE: 2024 chronic   SUBJECTIVE:  SUBJECTIVE STATEMENT: Pt indicated seeing MD who indicated possible injection for hip available (Pt deferred).  Pt indicated Lt knee laterally still aggravated at times.  Reported variability in mobility for bike.   PERTINENT HISTORY:  PMH significant for anxiety, OA, DM, hypothyroidism, and Lt TKA 08/21/22. Rt TKA   PAIN:  NPRS scale:  upon arrival 0/10.  Pain location: Posterior left knee, lateral left shin and lateral left hip are most irritating. Pain description: Stiff Aggravating factors: Going down hill walk/stairs, transfers Relieving factors: Exercises  PRECAUTIONS: None  WEIGHT BEARING RESTRICTIONS: No  FALLS:  Has patient fallen in last 6 months? No  LIVING ENVIRONMENT: Lives in: House/apartment Stairs: flight of stairs to bedroom  OCCUPATION: Work with computer work, standing/walking.   PLOF: Independent, gym based activity, walking  PATIENT GOALS: Reduce pain   OBJECTIVE:   DIAGNOSTIC FINDINGS:  See chart   PATIENT SURVEYS:  Patient-Specific Activity Scoring Scheme  0 represents "unable to perform." 10 represents "able to perform at prior level. 0 1 2 3 4 5 6 7 8 9  10 (Date and Score)   Activity Eval  01/15/2024  02/15/2024  1. Stairs   4  6  2. transfers  7  8  3. Sleeping  7 9  4. Walking down hills  4 10  5.    Score 5.5 8.25   Total score = sum of the activity scores/number of activities Minimum detectable change (90%CI) for average score = 2 points Minimum detectable change (90%CI) for  single activity score = 3 points  SCREENING FOR RED FLAGS: 01/15/2024 Bowel or bladder incontinence: No Cauda equina syndrome: No  COGNITION: 01/15/2024 Overall cognitive status: WFL normal      SENSATION: 01/15/2024 WFL  MUSCLE LENGTH: 01/15/2024 No specific testing in clinic today  POSTURE:  01/15/2024 Unremarkable for current condition.   PALPATION: 01/15/2024 Tenderness with trigger points noted in Lt glute med/min, lateral Lt quad, Lateral knee joint, distal IT band insertion.   LUMBAR ROM:  01/15/2024 Directional Preference Assessment: Centralization: not observed Peripheralization: not observed   AROM Eval 01/15/2024  Flexion To mid shin without complaint.   Extension 50% WFL with no change in complaints.    (Blank rows = not tested)  LOWER EXTREMITY ROM:      Right Eval 01/15/2024 Left Eval 01/15/2024  Knee flexion 108 AROM heel slide in supine 108 AROM heel slide in supine  Knee extension 0 0   (Blank rows = not tested)  LOWER EXTREMITY MMT:    MMT Right Eval 01/15/2024 Left Eval 01/15/2024 Right 02/08/2024 Left 02/08/2024  Hip flexion 5/5 5/5    Hip extension      Hip abduction 4/5 4/5 4+/5 4+/5  Hip adduction      Hip internal rotation      Hip external rotation      Knee flexion 5/5 5/5    Knee extension 5/5 45, 40.5 lbs 4+/5 37.4, 37.8 lbs 5/5 44, 43.5  lbs  4+/5 38.3, 37.5 lbs  Ankle dorsiflexion 5/5 5/5    Ankle plantarflexion      Ankle inversion      Ankle eversion       (Blank rows = not tested)  SPECIAL TESTS:  01/15/2024 (-) slump bilaterally.   FUNCTIONAL TESTS:  01/15/2024 18 inch chair transfer s UE assist on 1st try, pain noted.  Trendelenburg in SLS assessment, Lt > Rt.  Lt SLS:  approx. 5 seconds Rt SLS : < 3 seconds   GAIT:  01/15/2024 Independent ambulation                                                                                                                                                                                                                    TODAY'S TREATMENT      DATE: 02/15/2024 Therex: Knee extension machine double leg up, single leg lowering slowly 15 lbs 3 x 10  bilaterally  Knee flexion machine single leg 3 x 10 bilaterally , 15 lbs  Recumbent bike seat 5, goal of keeping machine on at 30 rpm or higher,  mins for ROM  Discussed how tightness in knee may vary per day due to activity load, etc.    TherActivity Leg press double leg 2 x 15 112 lbs with slow lowering focus Leg press single leg 2 x 10 50 lbs  Step over 6 inch with slow lowering posteriorly focus x 10 bilaterally Lateral step down 4 inch step with slow lowering focus x 10 bilaterally   TODAY'S TREATMENT      DATE: 02/10/2024 Therex: Knee extension machine double leg up, single leg lowering slowly 15 lbs 2 x 10 bilaterally  Knee flexion machine single leg 2 x 10 bilaterally , 15 lbs  Recumbent bike seat 5, goal of keeping machine on at 20 rpm or higher,  mins for ROM Standing hip abduction green band 2 x 10 bilaterally  Lateral stepping 10 ft x 5 each way with green band  Incline gastroc stretch 30 sec x 3 bilateral  Seated SLR with quad set with DF x 10 slow movement focus, performed bilaterally.     TODAY'S TREATMENT      DATE: 02/08/2024 Therex: Recumbent bike seat 5 11 mins for ROM Incilne gastroc stretch 30 sec x 3 bilateral  Supine bridge 2-3 sec hold with progressive heel slide walk up for knee motion.  Performed 4 x 5  TherActivity (to improve stairs, squatting, transfers) Lateral step down 6 inch with slow lowering focus 2 x 10 bilateral  Step on over and down 4 inch step x 15 bilaterally with slow lowering focus.   Manual Percussive device to Rt glute max, min , Rt QL.  Lt lateral thigh/quad middle and distal 1/3s.   TODAY'S TREATMENT      DATE: 02/03/24 TherAct Single limb leg press 50# 3x10; bil TRX squats 2x10 TRX lateral lunge 2x10 bil Sit to/from stand with 15# KB  2x10  TherEx Prone quad stretch 3x30 sec bil Supine hamstring stretch 3x30 sec  bil Single limb knee extension 10# 2x10   TODAY'S TREATMENT      DATE:02/01/24 TherAct NuStep L6 x 8 min Hooklying single limb clamshell 2x10; L5 band Bridges with isometric abduction with L5 band 5 sec hold; 2x10 Sidelying hip circles 2x10 CW/CCW; performed bil Single limb leg press 50# 2x10; LLE only Knee ext machine bil concentric; LLE eccentric 2x10; 10#     TODAY'S TREATMENT      DATE:01/28/2024 Recumbent bike Seat 6 for 5 minutes Level 2 Supine IT Band stretch 5 x 20 seconds each side (emphasis on adduction and IR in supine hamstrings stretch position) Pelvic rotation 10 x 10 seconds Side-lie clams with Black Thera-band resistance 10 x bilateral Yoga Bridge 10 x 5 seconds Single knee to chest 4 x 15 seconds Seated quad sets 5 x each side Seated straight leg raises 5 x each side Hip hike 10 x 5 seconds    PATIENT EDUCATION:  01/15/2024 Education details: HEP, POC Person educated: Patient Education method: Programmer, Multimedia, Facilities Manager, Verbal cues, and Handouts Education comprehension: verbalized understanding, returned demonstration, and verbal cues required  HOME EXERCISE PROGRAM: Access Code: [email or web address] URL: [email or web address] Date: 02/01/2024 Prepared by: Corean Ku  Exercises - Standing Lumbar Extension  - 2-4 x daily - 7 x weekly - 1 sets - 5-10 reps - Supine Lower Trunk Rotation  - 1-2 x daily - 7 x weekly - 1 sets - 3-5 reps - 15 hold - Clamshell  - 1-2 x daily - 7 x weekly - 2-3 sets - 10-15 reps - Supine Bridge  - 1-2 x daily - 7 x weekly - 1-2 sets - 10 reps - 2-5 hold - Supine Single Knee to Chest Stretch  - 1-2 x daily - 7 x weekly - 1 sets - 5 reps - 15 hold - Seated Quad Set  - 1-2 x daily - 7 x weekly - 1 sets - 10 reps - 5 hold - Seated SLR  - 1-2 x daily - 7 x weekly - 1-2 sets - 10-15 reps - 2 hold - Standing Hip Hiking  - 1-2 x daily - 7 x  weekly - 1 sets - 10 reps - 5 hold - Standing Lumbar Extension at Wall - Forearms  - 5 x daily - 7 x weekly - 1 sets - 5 reps - 3 seconds hold - Supine ITB Stretch with Strap  - 2 x daily - 7 x weekly - 1 sets - 5 reps - 15 seconds hold - Hooklying Single Leg Bent Knee Fallouts with Resistance  - 1 x daily - 7 x weekly - 3 sets - 10 reps - Sidelying Hip Circles  - 2 x daily - 7 x weekly - 2 sets - 10 circles each way    ASSESSMENT:  CLINICAL IMPRESSION: Patient specific functional scale reassessment showed improvement today compared to eval 1 month ago.  Stairs was reported as most difficult rating still at this time.  Continued functional strengthening to help improve WB acceptance.    OBJECTIVE IMPAIRMENTS: Abnormal gait, decreased activity tolerance, decreased balance, decreased coordination, decreased endurance, decreased mobility, difficulty walking, decreased ROM, decreased strength, increased fascial restrictions, impaired perceived functional ability, increased muscle spasms, impaired flexibility, improper body mechanics, and pain.   ACTIVITY LIMITATIONS: bending, sitting, standing, squatting, sleeping, stairs, transfers, bed mobility, and locomotion level  PARTICIPATION LIMITATIONS: meal prep, cleaning, laundry, interpersonal relationship, shopping, and community activity  PERSONAL FACTORS: Time since onset of injury/illness/exacerbation and PMH significant for anxiety, OA, DM,  hypothyroidism, and Lt TKA 08/21/22.  Rt TKA are also affecting patient's functional outcome.   REHAB POTENTIAL: Good  CLINICAL DECISION MAKING: Evolving/moderate complexity  EVALUATION COMPLEXITY: Moderate   GOALS: Goals reviewed with patient? Yes  SHORT TERM GOALS: (target date for Short term goals are 3 weeks 02/15/2024)  1. Patient will demonstrate independent use of home exercise program to maintain progress from in clinic treatments.  Goal status: Ongoing 02/08/2024  LONG TERM GOALS: (target  dates for all long term goals are 10 weeks  03/25/2024 )   1. Patient will demonstrate/report pain at worst less than or equal to 2/10 to facilitate minimal limitation in daily activity secondary to pain symptoms.  Goal status: Ongoing 02/08/2024   2. Patient will demonstrate independent use of home exercise program to facilitate ability to maintain/progress functional gains from skilled physical therapy services.  Goal status: Ongoing 02/08/2024   3. Patient will demonstrate Patient specific functional scale avg > or = 8/10 to indicate reduced disability due to condition.   Goal status: Ongoing 02/08/2024   4. Patient will demonstrate lumbar extension 100 % WFL s symptoms to facilitate upright standing, walking posture at PLOF s limitation.  Goal status: Ongoing 02/08/2024   5.  Patient will demonstrate bilateral hip MMT 5/5, knee extension dynamometry improvement > 15 lbs to facilitate stairs, incline/decline walking and other daily activity at PLOF.   Goal status: Ongoing 02/08/2024   6.  Patient will demonstrate SLS bilaterally > 10 seconds to facilitate stability in ambulation.  Goal status: Ongoing 02/08/2024   7.  Patient will demonstrate ascending/descending stairs reciprocally s UE assist for community integration.   Goal Status: Ongoing 02/08/2024  PLAN:  PT FREQUENCY: 1-2x/week  PT DURATION: 10 weeks  PLANNED INTERVENTIONS: Can include 02853- PT Re-evaluation, 97110-Therapeutic exercises, 97530- Therapeutic activity, 97112- Neuromuscular re-education, 97535- Self Care, 97140- Manual therapy, [phone]- Gait training, [phone]- Orthotic Fit/training, [phone]- Canalith repositioning, [id number]- Aquatic Therapy, [phone]- Electrical stimulation (unattended), [id number] Physical performance testing, 97016- Vasopneumatic device, [id number]- Ultrasound, [id number]- Traction (mechanical), [id number]- Ionotophoresis 4mg /ml Dexamethasone ,  [id number] - Needle insertion w/o injection 1 or 2 muscles, 20561 - Needle  insertion w/o injection 3 or more muscles.    Patient/Family education, Balance training, Stair training, Taping, Dry Needling, Joint mobilization, Joint manipulation, Spinal manipulation, Spinal mobilization, Scar mobilization, Vestibular training, Visual/preceptual remediation/compensation, DME instructions, Cryotherapy, and Moist heat.  All performed as medically necessary.  All included unless contraindicated  PLAN FOR NEXT SESSION:  WB strengthening gains for stair improvements.    Ozell Silvan, PT, DPT, OCS, ATC 02/15/24  12:27 PM

## 2024-02-17 ENCOUNTER — Ambulatory Visit (INDEPENDENT_AMBULATORY_CARE_PROVIDER_SITE_OTHER): Admitting: Rehabilitative and Restorative Service Providers"

## 2024-02-17 ENCOUNTER — Encounter: Payer: Self-pay | Admitting: Rehabilitative and Restorative Service Providers"

## 2024-02-17 DIAGNOSIS — M79605 Pain in left leg: Secondary | ICD-10-CM

## 2024-02-17 DIAGNOSIS — M5459 Other low back pain: Secondary | ICD-10-CM | POA: Diagnosis not present

## 2024-02-17 DIAGNOSIS — M6281 Muscle weakness (generalized): Secondary | ICD-10-CM | POA: Diagnosis not present

## 2024-02-17 DIAGNOSIS — M79604 Pain in right leg: Secondary | ICD-10-CM

## 2024-02-17 DIAGNOSIS — R262 Difficulty in walking, not elsewhere classified: Secondary | ICD-10-CM

## 2024-02-17 NOTE — Therapy (Addendum)
 " OUTPATIENT PHYSICAL THERAPY TREATMENT / PROGRESS NOTE / DISCHARGE   Patient Name: Krystal Lang MRN: [id number] DOB:11/14/1955, 68 y.o., female Today's Date: 02/17/2024   Progress Note Reporting Period 01/15/2024 to 02/17/2024  See note below for Objective Data and Assessment of Progress/Goals.      END OF SESSION:  PT End of Session - 02/17/24 1124     Visit Number 8    Number of Visits 20    Date for Recertification  03/25/24    Authorization Type Medicare    Progress Note Due on Visit 18    PT Start Time 1103    PT Stop Time 1142    PT Time Calculation (min) 39 min    Activity Tolerance Patient tolerated treatment well    Behavior During Therapy WFL for tasks assessed/performed                 Past Medical History:  Diagnosis Date   Anxiety    Arthritis    Cataract    Mixed form OU   Complication of anesthesia    after knee arthroscopy pateint difficult to wake up   Diabetes mellitus    Fibroid uterus 2001   H/O fatigue 1998   Headache(784.0)    denies   Hepatitis    A, 30 years ago   Hirsutism 1998   Idiopathic   History of chicken pox    Hypothyroidism    Menses, irregular 1998   Past Surgical History:  Procedure Laterality Date   COLONOSCOPY  02/2021   DILATION AND CURETTAGE OF UTERUS  03/31/2000   In [place]   KNEE ARTHROPLASTY Right    Replaced in [place], [place]   KNEE ARTHROSCOPY Right 2008   TONSILLECTOMY     As a child   TOTAL KNEE ARTHROPLASTY Left 08/21/2022   Procedure: LEFT TOTAL KNEE ARTHROPLASTY;  Surgeon: Vernetta Lonni GRADE, MD;  Location: [facility];  Service: Orthopedics;  Laterality: Left;   Patient Active Problem List   Diagnosis Date Noted   Status post total left knee replacement 08/21/2022   Dyspareunia, female 11/13/2011   Type 2 diabetes mellitus (HCC) 11/13/2011   Alopecia 11/13/2011   History of chicken pox    Headache    Hypothyroidism    Menses, irregular    Hirsutism    H/O fatigue    Fibroid uterus      PCP: Negri Pacific, MD  REFERRING PROVIDER: Vernetta Lonni GRADE, MD  REFERRING DIAG: M54.50,G89.29 (ICD-10-CM) - Chronic left-sided low back pain without sciatica  Rationale for Evaluation and Treatment: Rehabilitation  THERAPY DIAG:  Other low back pain  Pain in left leg  Pain in right leg  Muscle weakness (generalized)  Difficulty in walking, not elsewhere classified  ONSET DATE: 2024 chronic   SUBJECTIVE:  SUBJECTIVE STATEMENT: Pt indicated overall improvement to normal about 70% at this time.  Reported still having some of that pain on lateral distal thigh/knee.   PERTINENT HISTORY:  PMH significant for anxiety, OA, DM, hypothyroidism, and Lt TKA 08/21/22. Rt TKA   PAIN:  NPRS scale:  upon arrival 0/10.  Pain location: Posterior left knee, lateral left shin and lateral left hip are most irritating. Pain description: Stiff Aggravating factors: Going down hill walk/stairs, transfers Relieving factors: Exercises  PRECAUTIONS: None  WEIGHT BEARING RESTRICTIONS: No  FALLS:  Has patient fallen in last 6 months? No  LIVING ENVIRONMENT: Lives in: House/apartment Stairs: flight of stairs to bedroom  OCCUPATION: Work with computer work, standing/walking.   PLOF: Independent, gym based activity, walking  PATIENT GOALS: Reduce pain   OBJECTIVE:   DIAGNOSTIC FINDINGS:  See chart   PATIENT SURVEYS:  Patient-Specific Activity Scoring Scheme  0 represents unable to perform. 10 represents able to perform at prior level. 0 1 2 3 4 5 6 7 8 9  10 (Date and Score)   Activity Eval  01/15/2024  02/15/2024  1. Stairs   4  6  2. transfers  7  8  3. Sleeping  7 9  4. Walking down hills  4 10  5.    Score 5.5 8.25   Total score = sum of the activity scores/number of  activities Minimum detectable change (90%CI) for average score = 2 points Minimum detectable change (90%CI) for single activity score = 3 points  SCREENING FOR RED FLAGS: 01/15/2024 Bowel or bladder incontinence: No Cauda equina syndrome: No  COGNITION: 01/15/2024 Overall cognitive status: WFL normal      SENSATION: 01/15/2024 WFL  MUSCLE LENGTH: 01/15/2024 No specific testing in clinic today  POSTURE:  01/15/2024 Unremarkable for current condition.   PALPATION: 01/15/2024 Tenderness with trigger points noted in Lt glute med/min, lateral Lt quad, Lateral knee joint, distal IT band insertion.   LUMBAR ROM:  01/15/2024 Directional Preference Assessment: Centralization: not observed Peripheralization: not observed   AROM Eval 01/15/2024 02/17/2024  Flexion To mid shin without complaint.    Extension 50% WFL with no change in complaints.  75% WFL with improvement to 100% after 5 reps.    (Blank rows = not tested)  LOWER EXTREMITY ROM:      Right Eval 01/15/2024 Left Eval 01/15/2024  Knee flexion 108 AROM heel slide in supine 108 AROM heel slide in supine  Knee extension 0 0   (Blank rows = not tested)  LOWER EXTREMITY MMT:    MMT Right Eval 01/15/2024 Left Eval 01/15/2024 Right 02/08/2024 Left 02/08/2024 Right 02/17/2024 Left 02/17/2024  Hip flexion 5/5 5/5      Hip extension        Hip abduction 4/5 4/5 4+/5 4+/5    Hip adduction        Hip internal rotation        Hip external rotation        Knee flexion 5/5 5/5      Knee extension 5/5 45, 40.5 lbs 4+/5 37.4, 37.8 lbs 5/5 44, 43.5  lbs  4+/5 38.3, 37.5 lbs 5/5 45, 46.3 lbs 5/5 41.5, 40.5 lbs  Ankle dorsiflexion 5/5 5/5      Ankle plantarflexion        Ankle inversion        Ankle eversion         (Blank rows = not tested)  SPECIAL TESTS:  01/15/2024 (-) slump bilaterally.   FUNCTIONAL  TESTS:  01/15/2024 18 inch chair transfer s UE assist on 1st try, pain noted.  Trendelenburg  in SLS assessment, Lt > Rt.  Lt SLS:  approx. 5 seconds Rt SLS : < 3 seconds   GAIT: 01/15/2024 Independent ambulation                                                                                                                                                                                                                   TODAY'S TREATMENT      DATE: 02/17/2024 Therex: Incline gastroc stretch 30 sec x 3  Knee extension machine double leg up, single leg lowering slowly 15 lbs 3 x 10  bilaterally  Knee flexion machine single leg 3 x 10 bilaterally , 15 lbs  Standing lumbar extension AROM x 5  Recumbent bike seat 5, goal of keeping machine on at 30 rpm or higher,  10 mins for ROM  TherActivity Flight of stairs up/down reciprocal gait pattern with single handrail assist. Discussed use of handrail for safety.  Lt knee lowering eccentric control mildly worse than Rt with some tightness increased noted in movement as restriction.  Discussed use of repetitive stepping on bottom step (forward and lateral) to improve strength control on non gym days.     TODAY'S TREATMENT      DATE: 02/15/2024 Therex: Knee extension machine double leg up, single leg lowering slowly 15 lbs 3 x 10  bilaterally  Knee flexion machine single leg 3 x 10 bilaterally , 15 lbs  Recumbent bike seat 5, goal of keeping machine on at 30 rpm or higher,  mins for ROM  Discussed how tightness in knee may vary per day due to activity load, etc.    TherActivity Leg press double leg 2 x 15 112 lbs with slow lowering focus Leg press single leg 2 x 10 50 lbs  Step over 6 inch with slow lowering posteriorly focus x 10 bilaterally Lateral step down 4 inch step with slow lowering focus x 10 bilaterally   TODAY'S TREATMENT      DATE: 02/10/2024 Therex: Knee extension machine double leg up, single leg lowering slowly 15 lbs 2 x 10 bilaterally  Knee flexion machine single leg 2 x 10 bilaterally , 15 lbs  Recumbent bike seat  5, goal of keeping machine on at 20 rpm or higher,  mins for ROM Standing hip abduction green band 2 x 10 bilaterally  Lateral stepping 10 ft x 5 each way with green band  Incline  gastroc stretch 30 sec x 3 bilateral  Seated SLR with quad set with DF x 10 slow movement focus, performed bilaterally.     TODAY'S TREATMENT      DATE: 02/08/2024 Therex: Recumbent bike seat 5 11 mins for ROM Incilne gastroc stretch 30 sec x 3 bilateral  Supine bridge 2-3 sec hold with progressive heel slide walk up for knee motion.  Performed 4 x 5  TherActivity (to improve stairs, squatting, transfers) Lateral step down 6 inch with slow lowering focus 2 x 10 bilateral  Step on over and down 4 inch step x 15 bilaterally with slow lowering focus.   Manual Percussive device to Rt glute max, min , Rt QL.  Lt lateral thigh/quad middle and distal 1/3s.   TODAY'S TREATMENT      DATE: 02/03/24 TherAct Single limb leg press 50# 3x10; bil TRX squats 2x10 TRX lateral lunge 2x10 bil Sit to/from stand with 15# KB 2x10  TherEx Prone quad stretch 3x30 sec bil Supine hamstring stretch 3x30 sec bil Single limb knee extension 10# 2x10  PATIENT EDUCATION:  01/15/2024 Education details: HEP, POC Person educated: Patient Education method: Programmer, Multimedia, Facilities Manager, Verbal cues, and Handouts Education comprehension: verbalized understanding, returned demonstration, and verbal cues required  HOME EXERCISE PROGRAM: Access Code: [email or web address] URL: [email or web address] Date: 02/17/2024 Prepared by: Ozell Silvan  Exercises - Standing Lumbar Extension  - 1-2 x daily - 7 x weekly - 1 sets - 5-10 reps - Supine Lower Trunk Rotation  - 1-2 x daily - 7 x weekly - 1 sets - 3-5 reps - 15 hold - Supine Bridge  - 1-2 x daily - 7 x weekly - 1-2 sets - 10 reps - 2-5 hold - Supine Single Knee to Chest Stretch  - 1-2 x daily - 7 x weekly - 1 sets - 5 reps - 15 hold - Supine ITB Stretch with Strap  - 1-2 x daily - 7  x weekly - 1 sets - 5 reps - 15 seconds hold - Seated Quad Set  - 1-2 x daily - 7 x weekly - 1 sets - 10 reps - 5 hold - Standing Hip Hiking  - 1 x daily - 7 x weekly - 1 sets - 10 reps - 5 hold - Seated SLR  - 1 x daily - 7 x weekly - 1-2 sets - 10-15 reps - 2 hold - Hooklying Isometric Clamshell  - 1 x daily - 7 x weekly - 2-3 sets - 10-15 reps - Step Up  - 1 x daily - 7 x weekly - 3 sets - 10 reps - Lateral Step Down  - 1 x daily - 7 x weekly - 3 sets - 10 reps    ASSESSMENT:  CLINICAL IMPRESSION: The patient has attended 8 visits over the course of treatment cycle.  Patient has reported overall improvement at 70%.  See objective data above for updated information regarding current presentation.  Improvements have been noted in LE strength with continued improvement recommended.  At this time, Pt has expressed and demonstrated knowledge of at home program and gym program for continued management of symptoms, mobility and strength.  Recommend trial of HEP and gym activity at this time.  Pt in agreement.  Reviewed existing HEP with updated printout provided. May return in future as necessary based off symptoms.     OBJECTIVE IMPAIRMENTS: Abnormal gait, decreased activity tolerance, decreased balance, decreased coordination, decreased endurance, decreased mobility, difficulty walking, decreased ROM, decreased strength, increased fascial restrictions,  impaired perceived functional ability, increased muscle spasms, impaired flexibility, improper body mechanics, and pain.   ACTIVITY LIMITATIONS: bending, sitting, standing, squatting, sleeping, stairs, transfers, bed mobility, and locomotion level  PARTICIPATION LIMITATIONS: meal prep, cleaning, laundry, interpersonal relationship, shopping, and community activity  PERSONAL FACTORS: Time since onset of injury/illness/exacerbation and PMH significant for anxiety, OA, DM, hypothyroidism, and Lt TKA 08/21/22.  Rt TKA are also affecting patient's  functional outcome.   REHAB POTENTIAL: Good  CLINICAL DECISION MAKING: Evolving/moderate complexity  EVALUATION COMPLEXITY: Moderate   GOALS: Goals reviewed with patient? Yes  SHORT TERM GOALS: (target date for Short term goals are 3 weeks 02/15/2024)  1. Patient will demonstrate independent use of home exercise program to maintain progress from in clinic treatments.  Goal status: Met 02/17/2024  LONG TERM GOALS: (target dates for all long term goals are 10 weeks  03/25/2024 )   1. Patient will demonstrate/report pain at worst less than or equal to 2/10 to facilitate minimal limitation in daily activity secondary to pain symptoms.  Goal status: partially Met 02/17/2024   2. Patient will demonstrate independent use of home exercise program to facilitate ability to maintain/progress functional gains from skilled physical therapy services.  Goal status: Met 02/17/2024   3. Patient will demonstrate Patient specific functional scale avg > or = 8/10 to indicate reduced disability due to condition.   Goal status: Met 02/17/2024   4. Patient will demonstrate lumbar extension 100 % WFL s symptoms to facilitate upright standing, walking posture at PLOF s limitation.  Goal status: Ongoing 02/08/2024   5.  Patient will demonstrate bilateral hip MMT 5/5, knee extension dynamometry improvement > 15 lbs to facilitate stairs, incline/decline walking and other daily activity at PLOF.   Goal status: partially Met 02/17/2024   6.  Patient will demonstrate SLS bilaterally > 10 seconds to facilitate stability in ambulation.  Goal status: Ongoing 02/08/2024   7.  Patient will demonstrate ascending/descending stairs reciprocally s UE assist for community integration.   Goal Status: partially Met 02/17/2024  PLAN:  PT FREQUENCY: 1-2x/week  PT DURATION: 10 weeks  PLANNED INTERVENTIONS: Can include 02853- PT Re-evaluation, 97110-Therapeutic exercises, 97530- Therapeutic activity, 97112-  Neuromuscular re-education, 97535- Self Care, 97140- Manual therapy, [phone]- Gait training, [phone]- Orthotic Fit/training, [phone]- Canalith repositioning, [id number]- Aquatic Therapy, [phone]- Electrical stimulation (unattended), [id number] Physical performance testing, 97016- Vasopneumatic device, [id number]- Ultrasound, [id number]- Traction (mechanical), [id number]- Ionotophoresis 4mg /ml Dexamethasone ,  [id number] - Needle insertion w/o injection 1 or 2 muscles, 20561 - Needle insertion w/o injection 3 or more muscles.    Patient/Family education, Balance training, Stair training, Taping, Dry Needling, Joint mobilization, Joint manipulation, Spinal manipulation, Spinal mobilization, Scar mobilization, Vestibular training, Visual/preceptual remediation/compensation, DME instructions, Cryotherapy, and Moist heat.  All performed as medically necessary.  All included unless contraindicated  PLAN FOR NEXT SESSION: Trial HEP. Discharge after 30 days inactivity.    Ozell Silvan, PT, DPT, OCS, ATC 02/17/24  11:39 AM    PHYSICAL THERAPY DISCHARGE SUMMARY  Visits from Start of Care: 8  Current functional level related to goals / functional outcomes: See note   Remaining deficits: See note   Education / Equipment: HEP  Patient goals were partially met. Patient is being discharged due to not returning since the last visit.  Ozell Silvan, PT, DPT, OCS, ATC 05/04/24  11:02 AM    "

## 2024-02-23 ENCOUNTER — Telehealth: Payer: Self-pay | Admitting: Orthopaedic Surgery

## 2024-02-23 NOTE — Telephone Encounter (Signed)
 Pt requesting a letter(she left a example of what she want writing in the letter) so that she can travel in business class seating only.  Pt states she will be leaving on 03/05/24 to go out of the country and just incase they have a delay or seating changes. Her seat will not be changed.

## 2024-03-31 ENCOUNTER — Other Ambulatory Visit: Payer: Self-pay | Admitting: Cardiology

## 2024-03-31 DIAGNOSIS — I5031 Acute diastolic (congestive) heart failure: Secondary | ICD-10-CM

## 2024-04-01 ENCOUNTER — Other Ambulatory Visit (HOSPITAL_COMMUNITY)

## 2024-04-01 ENCOUNTER — Ambulatory Visit (HOSPITAL_COMMUNITY)
Admission: RE | Admit: 2024-04-01 | Discharge: 2024-04-01 | Disposition: A | Source: Ambulatory Visit | Attending: Cardiology | Admitting: Cardiology

## 2024-04-01 DIAGNOSIS — I5031 Acute diastolic (congestive) heart failure: Secondary | ICD-10-CM | POA: Diagnosis not present

## 2024-04-01 DIAGNOSIS — E119 Type 2 diabetes mellitus without complications: Secondary | ICD-10-CM | POA: Insufficient documentation

## 2024-04-01 DIAGNOSIS — I3481 Nonrheumatic mitral (valve) annulus calcification: Secondary | ICD-10-CM | POA: Insufficient documentation

## 2024-04-01 LAB — ECHOCARDIOGRAM COMPLETE BUBBLE STUDY
Area-P 1/2: 3.76 cm2
Calc EF: 46 %
S' Lateral: 2.6 cm
Single Plane A2C EF: 43.7 %
Single Plane A4C EF: 48.4 %

## 2024-04-04 DIAGNOSIS — I25110 Atherosclerotic heart disease of native coronary artery with unstable angina pectoris: Secondary | ICD-10-CM

## 2024-04-05 ENCOUNTER — Ambulatory Visit (HOSPITAL_COMMUNITY)
Admission: RE | Admit: 2024-04-05 | Discharge: 2024-04-05 | Disposition: A | Discharge status: Home / Self Care | Source: Ambulatory Visit | Attending: Cardiology | Admitting: Cardiology

## 2024-04-05 DIAGNOSIS — I25110 Atherosclerotic heart disease of native coronary artery with unstable angina pectoris: Secondary | ICD-10-CM | POA: Diagnosis present

## 2024-04-05 MED ORDER — REGADENOSON 0.4 MG/5ML IV SOLN
INTRAVENOUS | Status: AC
  Filled 2024-04-05: qty 5

## 2024-04-05 MED ORDER — TECHNETIUM TC 99M TETROFOSMIN IV KIT
10.0000 | PACK | Freq: Once | INTRAVENOUS | Status: AC | PRN
  Administered 2024-04-05: 10.02 via INTRAVENOUS

## 2024-04-05 MED ORDER — REGADENOSON 0.4 MG/5ML IV SOLN
0.4000 mg | Freq: Once | INTRAVENOUS | Status: AC
  Administered 2024-04-05: 0.4 mg via INTRAVENOUS

## 2024-04-05 MED ORDER — TECHNETIUM TC 99M TETROFOSMIN IV KIT
30.0000 | PACK | Freq: Once | INTRAVENOUS | Status: AC | PRN
  Administered 2024-04-05: 32.4 via INTRAVENOUS

## 2024-04-06 NOTE — H&P (View-Only) (Signed)
 "  Cardiology Office Note:   Date:  04/07/2024  ID:  Krystal Lang, DOB 26-Oct-1955, MRN [id number] PCP:  Negri Pacific, MD  [facility] Providers Cardiologist:  Wendel Haws, MD Referring MD: Negri Pacific, MD  Chief Complaint/Reason for Referral: Abnormal echocardiogram ASSESSMENT:    1. Cardiomyopathy, unspecified type (HCC)   2. Coronary artery calcification seen on CAT scan   3. Type 2 diabetes mellitus without complication, without long-term current use of insulin  (HCC)   4. Hyperlipidemia associated with type 2 diabetes mellitus (HCC)   5. Aortic atherosclerosis   6. Diastolic dysfunction   7. Stage 3a chronic kidney disease (HCC)   8. Palpitations   9. BMI 27.0-27.9,adult     PLAN:   In order of problems listed above: Cardiomyopathy: Given abnormal ejection fraction and infarction on Lexiscan  stress test will refer for coronary angiography for risk assessment.  Continue Jardiance  10 mg; start Toprol  12.5 mg at bedtime will consider losartan at a later date Coronary calcification: Continue aspirin  81 mg and Plavix  75 mg.  Continue statin with adjustment below. T2DM: Continue aspirin  81 mg, continue statin with adjustment below, continue Jardiance  10 mg, consider losartan at a later date Hyperlipidemia: LDL recently was 94.  Will convert to Crestor  20 mg.  Check lipid panel LFTs LP(a) in 2 months. Aortic atherosclerosis: Continue aspirin  81 mg, statin as above Diastolic dysfunction: Continue Jardiance  10 mg, start Toprol  12.5 mg at bedtime CKD stage IIIa: Continue Jardiance  10 mg Palpitations: Seems to have them at night and seems related to position.  Restarting a low-dose of Toprol  as discussed above.  May consider monitor in the future. Elevated BMI: Discussed referral for GLP-1 therapy.  Given recent nausea and diarrhea will defer for now and consider in the future.  I have reviewed the risks, indications, and alternatives to cardiac catheterization, possible  angioplasty, and stenting with the patient. Risks include but are not limited to bleeding, infection, vascular injury, stroke, myocardial infection, arrhythmia, kidney injury, radiation-related injury in the case of prolonged fluoroscopy use, emergency cardiac surgery, and death. The patient understands the risks of serious complication is 1-2 in 1000 with diagnostic cardiac cath and 1-2% or less with angioplasty/stenting.          Informed Consent   Shared Decision Making/Informed Consent The risks [stroke (1 in 1000), death (1 in 1000), kidney failure [usually temporary] (1 in 500), bleeding (1 in 200), allergic reaction [possibly serious] (1 in 200)], benefits (diagnostic support and management of coronary artery disease) and alternatives of a cardiac catheterization were discussed in detail with [name] and she is willing to proceed.      Dispo:  Return in about 2 months (around 06/05/2024).       I spent 42 minutes reviewing all clinical data during and prior to this visit including all relevant imaging studies, laboratories, clinical information from other health systems and prior notes from both Cardiology and other specialties, interviewing the patient, conducting a complete physical examination, and coordinating care in order to formulate a comprehensive and personalized evaluation and treatment plan.   History of Present Illness:    FOCUSED PROBLEM LIST:   Coronary artery calcification Calcium  score CT 2025 T2DM Not on insulin  Hyperlipidemia Aortic atherosclerosis Calcium  score CT 2025 Cardiomyopathy EF 45 to 50% mid anterior, apical anterior, and inferoapical hypokinesis TTE January 2026 Apical infarction with peri-infarct ischemia Lexiscan  stress test January 2026 Diastolic dysfunction G1 DD, no significant valvular issues, EF 45 to 50% TTE January  2026 CKD stage IIIa Hypothyroidism BMI 27.07 April 2024:  Patient consents to use of AI scribe. The patient is a  69 year old female with the above listed medical problems here for recommendations regarding her mild cardiomyopathy.  Apparently she was in [place] recently.  She was admitted to the hospital there.  Her troponins were mildly elevated.  A point-of-care echocardiogram demonstrated a potential stress cardiomyopathy with apical ballooning.  She returned to the [place] .  Echocardiogram here demonstrated an ejection fraction of 45 to 50% with anterior and apical anterior hypokinesis.  Lexiscan  stress test demonstrated an apical infarction.  She is here for further recommendations.  Of note her EKG today demonstrates anterior and lateral T wave inversions.  During a recent trip to [place], she experienced shortness of breath initially while walking up a slight hill, which resolved after resting. A second episode occurred at night, described as 'very, very, very mild,' and resolved after she went to sleep. The following night, the shortness of breath recurred more severely, prompting a hospital visit.  In the hospital in [place], she was administered Lasix and underwent an EKG, which showed an 'interoceptival MI without ST elevation.' An echocardiogram and chest x-ray were performed. Troponin levels were elevated at 406 and 412.  Since returning from [place], she has experienced persistent tiredness and developed a cough, which has improved with azithromycin. No current shortness of breath, nausea, diarrhea, or swelling in her legs. She reports occasional 'fluttering' sensations in her chest but no lightheadedness or bleeding issues.  Her current medications include Janumet , Jardiance , atorvastatin, and thyroid medication. She was previously on aspirin  and Plavix , which were started before her trip to [place]. She has not yet started losartan, which was prescribed but not initiated.     Current Medications: Active Medications[1]   Review of Systems:   Please see the history of present illness.    All other  systems reviewed and are negative.     EKGs/Labs/Other Test Reviewed:   EKG: 2024 sinus rhythm, septal infarction pattern, low voltages  EKG Interpretation Date/Time:  Thursday April 07 2024 08:28:31 EST Ventricular Rate:  97 PR Interval:  144 QRS Duration:  74 QT Interval:  370 QTC Calculation: 469 R Axis:   217  Text Interpretation: Normal sinus rhythm Right superior axis deviation Low voltage QRS Anteroseptal infarction pattern Septal infarct (cited on or before 12-Jun-2022) T wave abnormality, consider inferior ischemia T wave abnormality, consider anterolateral ischemia When compared with ECG of 16-Sep-2022 11:50, QRS axis Shifted left T wave inversion now evident in Inferior leads T wave inversion now evident in Anterolateral leads QT has lengthened Confirmed by Wendel Haws ([id number]) on 04/07/2024 8:30:24 AM        CARDIAC STUDIES: Refer to CV Procedures and Imaging Tabs   Risk Assessment/Calculations:          Physical Exam:   VS:  BP (!) 108/56 (BP Location: Right Arm, Patient Position: Sitting, Cuff Size: Normal)   Pulse 96   Ht 5' 3 (1.6 m)   Wt 163 lb 6.4 oz (74.1 kg)   BMI 28.95 kg/m        Wt Readings from Last 3 Encounters:  04/07/24 163 lb 6.4 oz (74.1 kg)  04/17/23 162 lb 8 oz (73.7 kg)  09/16/22 162 lb 0.6 oz (73.5 kg)      GENERAL:  No apparent distress, AOx3 HEENT:  No carotid bruits, +2 carotid impulses, no scleral icterus CAR: RRR no murmurs, gallops, rubs, or thrills RES:  Clear to auscultation bilaterally ABD:  Soft, nontender, nondistended, positive bowel sounds x 4 VASC:  +2 radial pulses, +2 carotid pulses NEURO:  CN 2-12 grossly intact; motor and sensory grossly intact PSYCH:  No active depression or anxiety EXT:  No edema, ecchymosis, or cyanosis  Signed, Lurena MARLA Red, MD  04/07/2024 10:02 AM    [facility] [address] [place], [place], [place]  [address] Phone: [phone]; Fax: [phone]   Note:  This  document was prepared using Dragon voice recognition software and may include unintentional dictation errors.     [1]  Current Meds  Medication Sig   aspirin  EC 81 MG tablet Take 81 mg by mouth daily. Swallow whole.   clopidogrel  (PLAVIX ) 75 MG tablet Take 75 mg by mouth daily.   empagliflozin  (JARDIANCE ) 10 MG TABS tablet Take 10 mg by mouth daily.   glimepiride  (AMARYL ) 2 MG tablet Take 2 mg by mouth in the morning and at bedtime.   levothyroxine  (SYNTHROID ) 100 MCG tablet Take 100 mcg by mouth daily before breakfast.   metoprolol  succinate (TOPROL -XL) 25 MG 24 hr tablet Take 0.5 tablets (12.5 mg total) by mouth at bedtime.   rosuvastatin  (CRESTOR ) 20 MG tablet Take 1 tablet (20 mg total) by mouth daily.   sitaGLIPtin -metformin  (JANUMET ) 50-1000 MG tablet Take 1 tablet by mouth 2 (two) times daily with a meal.   Vitamin D , Ergocalciferol , (DRISDOL ) 1.25 MG (50000 UNIT) CAPS capsule Take 50,000 Units by mouth every 7 (seven) days.   [DISCONTINUED] atorvastatin (LIPITOR) 20 MG tablet Take 20 mg by mouth 3 (three) times a week.   "

## 2024-04-06 NOTE — Progress Notes (Signed)
 "  Cardiology Office Note:   Date:  04/07/2024  ID:  Krystal Lang, DOB 26-Oct-1955, MRN [id number] PCP:  Negri Pacific, MD  [facility] Providers Cardiologist:  Wendel Haws, MD Referring MD: Negri Pacific, MD  Chief Complaint/Reason for Referral: Abnormal echocardiogram ASSESSMENT:    1. Cardiomyopathy, unspecified type (HCC)   2. Coronary artery calcification seen on CAT scan   3. Type 2 diabetes mellitus without complication, without long-term current use of insulin  (HCC)   4. Hyperlipidemia associated with type 2 diabetes mellitus (HCC)   5. Aortic atherosclerosis   6. Diastolic dysfunction   7. Stage 3a chronic kidney disease (HCC)   8. Palpitations   9. BMI 27.0-27.9,adult     PLAN:   In order of problems listed above: Cardiomyopathy: Given abnormal ejection fraction and infarction on Lexiscan  stress test will refer for coronary angiography for risk assessment.  Continue Jardiance  10 mg; start Toprol  12.5 mg at bedtime will consider losartan at a later date Coronary calcification: Continue aspirin  81 mg and Plavix  75 mg.  Continue statin with adjustment below. T2DM: Continue aspirin  81 mg, continue statin with adjustment below, continue Jardiance  10 mg, consider losartan at a later date Hyperlipidemia: LDL recently was 94.  Will convert to Crestor  20 mg.  Check lipid panel LFTs LP(a) in 2 months. Aortic atherosclerosis: Continue aspirin  81 mg, statin as above Diastolic dysfunction: Continue Jardiance  10 mg, start Toprol  12.5 mg at bedtime CKD stage IIIa: Continue Jardiance  10 mg Palpitations: Seems to have them at night and seems related to position.  Restarting a low-dose of Toprol  as discussed above.  May consider monitor in the future. Elevated BMI: Discussed referral for GLP-1 therapy.  Given recent nausea and diarrhea will defer for now and consider in the future.  I have reviewed the risks, indications, and alternatives to cardiac catheterization, possible  angioplasty, and stenting with the patient. Risks include but are not limited to bleeding, infection, vascular injury, stroke, myocardial infection, arrhythmia, kidney injury, radiation-related injury in the case of prolonged fluoroscopy use, emergency cardiac surgery, and death. The patient understands the risks of serious complication is 1-2 in 1000 with diagnostic cardiac cath and 1-2% or less with angioplasty/stenting.          Informed Consent   Shared Decision Making/Informed Consent The risks [stroke (1 in 1000), death (1 in 1000), kidney failure [usually temporary] (1 in 500), bleeding (1 in 200), allergic reaction [possibly serious] (1 in 200)], benefits (diagnostic support and management of coronary artery disease) and alternatives of a cardiac catheterization were discussed in detail with [name] and she is willing to proceed.      Dispo:  Return in about 2 months (around 06/05/2024).       I spent 42 minutes reviewing all clinical data during and prior to this visit including all relevant imaging studies, laboratories, clinical information from other health systems and prior notes from both Cardiology and other specialties, interviewing the patient, conducting a complete physical examination, and coordinating care in order to formulate a comprehensive and personalized evaluation and treatment plan.   History of Present Illness:    FOCUSED PROBLEM LIST:   Coronary artery calcification Calcium  score CT 2025 T2DM Not on insulin  Hyperlipidemia Aortic atherosclerosis Calcium  score CT 2025 Cardiomyopathy EF 45 to 50% mid anterior, apical anterior, and inferoapical hypokinesis TTE January 2026 Apical infarction with peri-infarct ischemia Lexiscan  stress test January 2026 Diastolic dysfunction G1 DD, no significant valvular issues, EF 45 to 50% TTE January  2026 CKD stage IIIa Hypothyroidism BMI 27.07 April 2024:  Patient consents to use of AI scribe. The patient is a  69 year old female with the above listed medical problems here for recommendations regarding her mild cardiomyopathy.  Apparently she was in [place] recently.  She was admitted to the hospital there.  Her troponins were mildly elevated.  A point-of-care echocardiogram demonstrated a potential stress cardiomyopathy with apical ballooning.  She returned to the [place] .  Echocardiogram here demonstrated an ejection fraction of 45 to 50% with anterior and apical anterior hypokinesis.  Lexiscan  stress test demonstrated an apical infarction.  She is here for further recommendations.  Of note her EKG today demonstrates anterior and lateral T wave inversions.  During a recent trip to [place], she experienced shortness of breath initially while walking up a slight hill, which resolved after resting. A second episode occurred at night, described as 'very, very, very mild,' and resolved after she went to sleep. The following night, the shortness of breath recurred more severely, prompting a hospital visit.  In the hospital in [place], she was administered Lasix and underwent an EKG, which showed an 'interoceptival MI without ST elevation.' An echocardiogram and chest x-ray were performed. Troponin levels were elevated at 406 and 412.  Since returning from [place], she has experienced persistent tiredness and developed a cough, which has improved with azithromycin. No current shortness of breath, nausea, diarrhea, or swelling in her legs. She reports occasional 'fluttering' sensations in her chest but no lightheadedness or bleeding issues.  Her current medications include Janumet , Jardiance , atorvastatin, and thyroid medication. She was previously on aspirin  and Plavix , which were started before her trip to [place]. She has not yet started losartan, which was prescribed but not initiated.     Current Medications: Active Medications[1]   Review of Systems:   Please see the history of present illness.    All other  systems reviewed and are negative.     EKGs/Labs/Other Test Reviewed:   EKG: 2024 sinus rhythm, septal infarction pattern, low voltages  EKG Interpretation Date/Time:  Thursday April 07 2024 08:28:31 EST Ventricular Rate:  97 PR Interval:  144 QRS Duration:  74 QT Interval:  370 QTC Calculation: 469 R Axis:   217  Text Interpretation: Normal sinus rhythm Right superior axis deviation Low voltage QRS Anteroseptal infarction pattern Septal infarct (cited on or before 12-Jun-2022) T wave abnormality, consider inferior ischemia T wave abnormality, consider anterolateral ischemia When compared with ECG of 16-Sep-2022 11:50, QRS axis Shifted left T wave inversion now evident in Inferior leads T wave inversion now evident in Anterolateral leads QT has lengthened Confirmed by Wendel Haws ([id number]) on 04/07/2024 8:30:24 AM        CARDIAC STUDIES: Refer to CV Procedures and Imaging Tabs   Risk Assessment/Calculations:          Physical Exam:   VS:  BP (!) 108/56 (BP Location: Right Arm, Patient Position: Sitting, Cuff Size: Normal)   Pulse 96   Ht 5' 3 (1.6 m)   Wt 163 lb 6.4 oz (74.1 kg)   BMI 28.95 kg/m        Wt Readings from Last 3 Encounters:  04/07/24 163 lb 6.4 oz (74.1 kg)  04/17/23 162 lb 8 oz (73.7 kg)  09/16/22 162 lb 0.6 oz (73.5 kg)      GENERAL:  No apparent distress, AOx3 HEENT:  No carotid bruits, +2 carotid impulses, no scleral icterus CAR: RRR no murmurs, gallops, rubs, or thrills RES:  Clear to auscultation bilaterally ABD:  Soft, nontender, nondistended, positive bowel sounds x 4 VASC:  +2 radial pulses, +2 carotid pulses NEURO:  CN 2-12 grossly intact; motor and sensory grossly intact PSYCH:  No active depression or anxiety EXT:  No edema, ecchymosis, or cyanosis  Signed, Lurena MARLA Red, MD  04/07/2024 10:02 AM    [facility] [address] [place], [place], [place]  [address] Phone: [phone]; Fax: [phone]   Note:  This  document was prepared using Dragon voice recognition software and may include unintentional dictation errors.     [1]  Current Meds  Medication Sig   aspirin  EC 81 MG tablet Take 81 mg by mouth daily. Swallow whole.   clopidogrel  (PLAVIX ) 75 MG tablet Take 75 mg by mouth daily.   empagliflozin  (JARDIANCE ) 10 MG TABS tablet Take 10 mg by mouth daily.   glimepiride  (AMARYL ) 2 MG tablet Take 2 mg by mouth in the morning and at bedtime.   levothyroxine  (SYNTHROID ) 100 MCG tablet Take 100 mcg by mouth daily before breakfast.   metoprolol  succinate (TOPROL -XL) 25 MG 24 hr tablet Take 0.5 tablets (12.5 mg total) by mouth at bedtime.   rosuvastatin  (CRESTOR ) 20 MG tablet Take 1 tablet (20 mg total) by mouth daily.   sitaGLIPtin -metformin  (JANUMET ) 50-1000 MG tablet Take 1 tablet by mouth 2 (two) times daily with a meal.   Vitamin D , Ergocalciferol , (DRISDOL ) 1.25 MG (50000 UNIT) CAPS capsule Take 50,000 Units by mouth every 7 (seven) days.   [DISCONTINUED] atorvastatin (LIPITOR) 20 MG tablet Take 20 mg by mouth 3 (three) times a week.   "

## 2024-04-07 ENCOUNTER — Encounter: Payer: Self-pay | Admitting: Internal Medicine

## 2024-04-07 ENCOUNTER — Ambulatory Visit: Attending: Internal Medicine | Admitting: Internal Medicine

## 2024-04-07 VITALS — BP 108/56 | HR 96 | Ht 63.0 in | Wt 163.4 lb

## 2024-04-07 DIAGNOSIS — I5189 Other ill-defined heart diseases: Secondary | ICD-10-CM | POA: Diagnosis present

## 2024-04-07 DIAGNOSIS — E119 Type 2 diabetes mellitus without complications: Secondary | ICD-10-CM | POA: Diagnosis present

## 2024-04-07 DIAGNOSIS — I429 Cardiomyopathy, unspecified: Secondary | ICD-10-CM | POA: Insufficient documentation

## 2024-04-07 DIAGNOSIS — E1169 Type 2 diabetes mellitus with other specified complication: Secondary | ICD-10-CM | POA: Insufficient documentation

## 2024-04-07 DIAGNOSIS — E785 Hyperlipidemia, unspecified: Secondary | ICD-10-CM | POA: Insufficient documentation

## 2024-04-07 DIAGNOSIS — I700 Atherosclerosis of aorta: Secondary | ICD-10-CM | POA: Insufficient documentation

## 2024-04-07 DIAGNOSIS — I2510 Atherosclerotic heart disease of native coronary artery without angina pectoris: Secondary | ICD-10-CM | POA: Diagnosis present

## 2024-04-07 DIAGNOSIS — R002 Palpitations: Secondary | ICD-10-CM | POA: Diagnosis present

## 2024-04-07 DIAGNOSIS — Z6827 Body mass index (BMI) 27.0-27.9, adult: Secondary | ICD-10-CM | POA: Diagnosis present

## 2024-04-07 DIAGNOSIS — N1831 Chronic kidney disease, stage 3a: Secondary | ICD-10-CM | POA: Diagnosis present

## 2024-04-07 MED ORDER — ROSUVASTATIN CALCIUM 20 MG PO TABS: 20.0000 mg | ORAL_TABLET | Freq: Every day | ORAL | 3 refills | Status: AC

## 2024-04-07 MED ORDER — METOPROLOL SUCCINATE ER 25 MG PO TB24: 12.5000 mg | ORAL_TABLET | Freq: Every day | ORAL | 3 refills | Status: AC

## 2024-04-07 NOTE — Patient Instructions (Addendum)
 Medication Instructions:  STOP Atorvastatin (Lipitor)  START Rosuvastatin  (Crestor ) 20 mg once daily   START Metoprolol  Succinate (Toprol  XL) 12.5 mg once daily at bedtime  *If you need a refill on your cardiac medications before your next appointment, please call your pharmacy*  Lab Work: To be completed in 2 months: lipid panel, LFT, lipoprotein-a (approximately 06/05/24)  If you have labs (blood work) drawn today and your tests are completely normal, you will receive your results only by: MyChart Message (if you have MyChart) OR A paper copy in the mail If you have any lab test that is abnormal or we need to change your treatment, we will call you to review the results.  Testing/Procedures: None ordered today.  Follow-Up: At [facility], you and your health needs are our priority.  As part of our continuing mission to provide you with exceptional heart care, our providers are all part of one team.  This team includes your primary Cardiologist (physician) and Advanced Practice Providers or APPs (Physician Assistants and Nurse Practitioners) who all work together to provide you with the care you need, when you need it.  Your next appointment:   2 month(s)  Provider:   Arun K Thukkani, MD

## 2024-04-11 ENCOUNTER — Other Ambulatory Visit: Payer: Self-pay

## 2024-04-11 ENCOUNTER — Encounter (HOSPITAL_COMMUNITY): Admission: RE | Payer: Self-pay | Source: Home / Self Care

## 2024-04-11 ENCOUNTER — Ambulatory Visit (HOSPITAL_COMMUNITY): Admission: RE | Admit: 2024-04-11 | Source: Home / Self Care | Admitting: Internal Medicine

## 2024-04-11 DIAGNOSIS — E1122 Type 2 diabetes mellitus with diabetic chronic kidney disease: Secondary | ICD-10-CM | POA: Insufficient documentation

## 2024-04-11 DIAGNOSIS — Z7902 Long term (current) use of antithrombotics/antiplatelets: Secondary | ICD-10-CM | POA: Insufficient documentation

## 2024-04-11 DIAGNOSIS — Z7982 Long term (current) use of aspirin: Secondary | ICD-10-CM | POA: Insufficient documentation

## 2024-04-11 DIAGNOSIS — E7849 Other hyperlipidemia: Secondary | ICD-10-CM | POA: Diagnosis not present

## 2024-04-11 DIAGNOSIS — I2584 Coronary atherosclerosis due to calcified coronary lesion: Secondary | ICD-10-CM | POA: Diagnosis not present

## 2024-04-11 DIAGNOSIS — N1831 Chronic kidney disease, stage 3a: Secondary | ICD-10-CM | POA: Insufficient documentation

## 2024-04-11 DIAGNOSIS — I255 Ischemic cardiomyopathy: Secondary | ICD-10-CM | POA: Insufficient documentation

## 2024-04-11 DIAGNOSIS — I2510 Atherosclerotic heart disease of native coronary artery without angina pectoris: Secondary | ICD-10-CM | POA: Diagnosis not present

## 2024-04-11 DIAGNOSIS — I700 Atherosclerosis of aorta: Secondary | ICD-10-CM | POA: Insufficient documentation

## 2024-04-11 DIAGNOSIS — Z79899 Other long term (current) drug therapy: Secondary | ICD-10-CM | POA: Diagnosis not present

## 2024-04-11 DIAGNOSIS — Z7984 Long term (current) use of oral hypoglycemic drugs: Secondary | ICD-10-CM | POA: Diagnosis not present

## 2024-04-11 DIAGNOSIS — E1169 Type 2 diabetes mellitus with other specified complication: Secondary | ICD-10-CM | POA: Diagnosis not present

## 2024-04-11 HISTORY — PX: CORONARY PRESSURE/FFR WITH 3D MAPPING: CATH118309

## 2024-04-11 HISTORY — PX: LEFT HEART CATH AND CORONARY ANGIOGRAPHY: CATH118249

## 2024-04-11 LAB — BASIC METABOLIC PANEL WITH GFR
Anion gap: 13 (ref 5–15)
BUN: 28 mg/dL — ABNORMAL HIGH (ref 8–23)
CO2: 21 mmol/L — ABNORMAL LOW (ref 22–32)
Calcium: 9.9 mg/dL (ref 8.9–10.3)
Chloride: 103 mmol/L (ref 98–111)
Creatinine, Ser: 1.32 mg/dL — ABNORMAL HIGH (ref 0.44–1.00)
GFR, Estimated: 43 mL/min — ABNORMAL LOW
Glucose, Bld: 166 mg/dL — ABNORMAL HIGH (ref 70–99)
Potassium: 4.8 mmol/L (ref 3.5–5.1)
Sodium: 137 mmol/L (ref 135–145)

## 2024-04-11 LAB — CBC
HCT: 44.1 % (ref 36.0–46.0)
Hemoglobin: 13.5 g/dL (ref 12.0–15.0)
MCH: 28.5 pg (ref 26.0–34.0)
MCHC: 30.6 g/dL (ref 30.0–36.0)
MCV: 93 fL (ref 80.0–100.0)
Platelets: 298 K/uL (ref 150–400)
RBC: 4.74 MIL/uL (ref 3.87–5.11)
RDW: 15.1 % (ref 11.5–15.5)
WBC: 6.6 K/uL (ref 4.0–10.5)
nRBC: 0 % (ref 0.0–0.2)

## 2024-04-11 LAB — GLUCOSE, CAPILLARY
Glucose-Capillary: 193 mg/dL — ABNORMAL HIGH (ref 70–99)
Glucose-Capillary: 121 mg/dL — ABNORMAL HIGH (ref 70–99)

## 2024-04-11 MED ORDER — ACETAMINOPHEN 325 MG PO TABS: 650.0000 mg | ORAL_TABLET | ORAL | Status: DC | PRN

## 2024-04-11 MED ORDER — SODIUM CHLORIDE 0.9 % IV SOLN: INTRAVENOUS | Status: DC

## 2024-04-11 MED ORDER — HYDRALAZINE HCL 20 MG/ML IJ SOLN: 10.0000 mg | INTRAMUSCULAR | Status: DC | PRN

## 2024-04-11 MED ORDER — LIDOCAINE HCL (PF) 1 % IJ SOLN
INTRAMUSCULAR | Status: AC
  Filled 2024-04-11: qty 30

## 2024-04-11 MED ORDER — ASPIRIN 81 MG PO CHEW
81.0000 mg | CHEWABLE_TABLET | ORAL | Status: AC
  Administered 2024-04-11: 81 mg via ORAL
  Filled 2024-04-11: qty 1

## 2024-04-11 MED ORDER — FENTANYL CITRATE (PF) 100 MCG/2ML IJ SOLN
INTRAMUSCULAR | Status: DC | PRN
  Administered 2024-04-11 (×2): 25 ug via INTRAVENOUS

## 2024-04-11 MED ORDER — FENTANYL CITRATE (PF) 100 MCG/2ML IJ SOLN
INTRAMUSCULAR | Status: AC
  Filled 2024-04-11: qty 2

## 2024-04-11 MED ORDER — VERAPAMIL HCL 2.5 MG/ML IV SOLN
INTRAVENOUS | Status: DC | PRN
  Administered 2024-04-11: 10 mL via INTRA_ARTERIAL

## 2024-04-11 MED ORDER — MIDAZOLAM HCL (PF) 2 MG/2ML IJ SOLN
INTRAMUSCULAR | Status: DC | PRN
  Administered 2024-04-11 (×2): 1 mg via INTRAVENOUS

## 2024-04-11 MED ORDER — MIDAZOLAM HCL 2 MG/2ML IJ SOLN
INTRAMUSCULAR | Status: AC
  Filled 2024-04-11: qty 2

## 2024-04-11 MED ORDER — LIDOCAINE HCL (PF) 1 % IJ SOLN
INTRAMUSCULAR | Status: DC | PRN
  Administered 2024-04-11: 2 mL

## 2024-04-11 MED ORDER — HEPARIN (PORCINE) IN NACL 1000-0.9 UT/500ML-% IV SOLN
INTRAVENOUS | Status: DC | PRN
  Administered 2024-04-11 (×2): 500 mL

## 2024-04-11 MED ORDER — SODIUM CHLORIDE 0.9% FLUSH: 3.0000 mL | Freq: Two times a day (BID) | INTRAVENOUS | Status: DC

## 2024-04-11 MED ORDER — DIAZEPAM 5 MG PO TABS
ORAL_TABLET | ORAL | Status: AC
  Filled 2024-04-11: qty 1

## 2024-04-11 MED ORDER — CLOPIDOGREL BISULFATE 75 MG PO TABS
75.0000 mg | ORAL_TABLET | ORAL | Status: AC
  Administered 2024-04-11: 75 mg via ORAL
  Filled 2024-04-11: qty 1

## 2024-04-11 MED ORDER — LABETALOL HCL 5 MG/ML IV SOLN: 10.0000 mg | INTRAVENOUS | Status: DC | PRN

## 2024-04-11 MED ORDER — ONDANSETRON HCL 4 MG/2ML IJ SOLN: 4.0000 mg | Freq: Four times a day (QID) | INTRAMUSCULAR | Status: DC | PRN

## 2024-04-11 MED ORDER — FREE WATER: 500.0000 mL | Freq: Once | Status: DC

## 2024-04-11 MED ORDER — SODIUM CHLORIDE 0.9 % IV SOLN: 250.0000 mL | INTRAVENOUS | Status: DC | PRN

## 2024-04-11 MED ORDER — VERAPAMIL HCL 2.5 MG/ML IV SOLN
INTRAVENOUS | Status: AC
  Filled 2024-04-11: qty 2

## 2024-04-11 MED ORDER — SODIUM CHLORIDE 0.9% FLUSH: 3.0000 mL | INTRAVENOUS | Status: DC | PRN

## 2024-04-11 MED ORDER — HEPARIN SODIUM (PORCINE) 1000 UNIT/ML IJ SOLN
INTRAMUSCULAR | Status: AC
  Filled 2024-04-11: qty 10

## 2024-04-11 MED ORDER — IOHEXOL 350 MG/ML SOLN
INTRAVENOUS | Status: DC | PRN
  Administered 2024-04-11: 50 mL via INTRA_ARTERIAL

## 2024-04-11 MED ORDER — DIAZEPAM 5 MG PO TABS
5.0000 mg | ORAL_TABLET | Freq: Once | ORAL | Status: AC
  Administered 2024-04-11: 5 mg via ORAL

## 2024-04-11 MED ORDER — HEPARIN SODIUM (PORCINE) 1000 UNIT/ML IJ SOLN
INTRAMUSCULAR | Status: DC | PRN
  Administered 2024-04-11: 5000 [IU] via INTRA_ARTERIAL

## 2024-04-11 NOTE — Discharge Instructions (Addendum)
 Restart Janumet  on 04/13/2024.  Restart Plavix  on 04/12/2024 Drink plenty of fluids for 48 hours and keep wrist elevated at heart level for 24 hours  Radial Site Care   This sheet gives you information about how to care for yourself after your procedure. Your health care provider may also give you more specific instructions. If you have problems or questions, contact your health care provider. What can I expect after the procedure? After the procedure, it is common to have: Bruising and tenderness at the catheter insertion area. Follow these instructions at home: Medicines Take over-the-counter and prescription medicines only as told by your health care provider. Insertion site care Follow instructions from your health care provider about how to take care of your insertion site. Make sure you: Wash your hands with soap and water  before you change your bandage (dressing). If soap and water  are not available, use hand sanitizer. Remove your dressing as told by your health care provider. In 24 hours Check your insertion site every day for signs of infection. Check for: Redness, swelling, or pain. Fluid or blood. Pus or a bad smell. Warmth. Do not take baths, swim, or use a hot tub until your health care provider approves. You may shower 24-48 hours after the procedure, or as directed by your health care provider. Remove the dressing and gently wash the site with plain soap and water . Bruna the area dry with a clean towel. Do not rub the site. That could cause bleeding. Do not apply powder or lotion to the site. Activity   For 24 hours after the procedure, or as directed by your health care provider: Do not flex or bend the affected arm. Do not push or pull heavy objects with the affected arm. Do not drive yourself home from the hospital or clinic. You may drive 24 hours after the procedure unless your health care provider tells you not to. Do not operate machinery or power tools. Do  not lift anything that is heavier than 10 lb (4.5 kg), or the limit that you are told, until your health care provider says that it is safe.  For 4 days Ask your health care provider when it is okay to: Return to work or school. Resume usual physical activities or sports. Resume sexual activity. General instructions If the catheter site starts to bleed, raise your arm and put firm pressure on the site. If the bleeding does not stop, get help right away. This is a medical emergency. If you went home on the same day as your procedure, a responsible adult should be with you for the first 24 hours after you arrive home. Keep all follow-up visits as told by your health care provider. This is important. Contact a health care provider if: You have a fever. You have redness, swelling, or yellow drainage around your insertion site. Get help right away if: You have unusual pain at the radial site. The catheter insertion area swells very fast. The insertion area is bleeding, and the bleeding does not stop when you hold steady pressure on the area. Your arm or hand becomes pale, cool, tingly, or numb. These symptoms may represent a serious problem that is an emergency. Do not wait to see if the symptoms will go away. Get medical help right away. Call your local emergency services (911 in the [place] Do not drive yourself to the hospital. Summary After the procedure, it is common to have bruising and tenderness at the site. Follow instructions from your health  care provider about how to take care of your radial site wound. Check the wound every day for signs of infection. Do not lift anything that is heavier than 10 lb (4.5 kg), or the limit that you are told, until your health care provider says that it is safe. This information is not intended to replace advice given to you by your health care provider. Make sure you discuss any questions you have with your health care provider. Document Revised:  04/22/2017 Document Reviewed: 04/22/2017 Elsevier Patient Education  2020 [employer].

## 2024-04-11 NOTE — Interval H&P Note (Signed)
 History and Physical Interval Note:  04/11/2024 6:46 AM  Krystal Lang  has presented today for surgery, with the diagnosis of cardiomyopathy.  The various methods of treatment have been discussed with the patient and family. After consideration of risks, benefits and other options for treatment, the patient has consented to  Procedures: LEFT HEART CATH AND CORONARY ANGIOGRAPHY (N/A) as a surgical intervention.  The patient's history has been reviewed, patient examined, no change in status, stable for surgery.  I have reviewed the patient's chart and labs.  Questions were answered to the patient's satisfaction.     Vong Garringer K Donata Reddick

## 2024-04-12 ENCOUNTER — Encounter (HOSPITAL_COMMUNITY): Payer: Self-pay | Admitting: Internal Medicine

## 2024-04-20 ENCOUNTER — Encounter: Payer: Self-pay | Admitting: Thoracic Surgery (Cardiothoracic Vascular Surgery)

## 2024-04-20 ENCOUNTER — Ambulatory Visit
Attending: Thoracic Surgery (Cardiothoracic Vascular Surgery) | Admitting: Thoracic Surgery (Cardiothoracic Vascular Surgery)

## 2024-04-20 VITALS — BP 140/78 | HR 72 | Resp 18 | Ht 63.0 in | Wt 160.0 lb

## 2024-04-20 DIAGNOSIS — I2510 Atherosclerotic heart disease of native coronary artery without angina pectoris: Secondary | ICD-10-CM | POA: Diagnosis present

## 2024-04-21 NOTE — Progress Notes (Signed)
 PCP is Levern Hutching, MD Referring Provider is Wendel Lurena POUR, MD  Chief Complaint  Patient presents with   Coronary Artery Disease    Surgical consult/ Cardiac Cath 04/11/24/ ECHO 04/01/24    HPI: Krystal Lang presents for evaluation of 3 vessel CAD.  Krystal Lang is a 69 yo woman with a history of type 2 diabetes, stage 3A chronic kidney disease, aortic atherosclerosis, hypothyroidism, anxiety, arthritis and recently diagnosed 3 vessel CAD.  She was in her usual state of health until December 2025.  She was in [place] for her son's wedding.  Experienced severe shortness of breath..  Symptoms resolved spontaneously.  She went a few days then had a similar episode which awakened her from sleep.  Went to hospital.  Troponin was elevated.  Her second troponin was slightly lower and she decide to come back to the [place] .  A Lexiscan  myoview  showed evidence of an infarct and low EF.  She saw Dr. Wendel, who recommended catheterization.  It revealed 50% left main stenosis and severe 3 vessel CAD.  There was severe disease in proximal LAD extending back to left main plaque.  No further episodes since returning to [place] .   Past Medical History:  Diagnosis Date   Anxiety    Arthritis    Cataract    Mixed form OU   Complication of anesthesia    after knee arthroscopy pateint difficult to wake up   Diabetes mellitus    Fibroid uterus 2001   H/O fatigue 1998   Headache(784.0)    denies   Hepatitis    A, 30 years ago   Hirsutism 1998   Idiopathic   History of chicken pox    Hypothyroidism    Menses, irregular 1998    Past Surgical History:  Procedure Laterality Date   COLONOSCOPY  02/2021   CORONARY PRESSURE/FFR WITH 3D MAPPING N/A 04/11/2024   Procedure: Coronary Pressure/FFR w/3D Mapping;  Surgeon: Wendel Lurena POUR, MD;  Location: [facility] IN[facility]ASIVE [facility];  Service: Cardiovascular;  Laterality: N/A;   DILATION AND CURETTAGE OF UTERUS  03/31/2000   In [place]   KNEE ARTHROPLASTY Right     Replaced in [place], [place]   KNEE ARTHROSCOPY Right 2008   LEFT HEART CATH AND CORONARY ANGIOGRAPHY N/A 04/11/2024   Procedure: LEFT HEART CATH AND CORONARY ANGIOGRAPHY;  Surgeon: Wendel Lurena POUR, MD;  Location: [facility];  Service: Cardiovascular;  Laterality: N/A;   TONSILLECTOMY     As a child   TOTAL KNEE ARTHROPLASTY Left 08/21/2022   Procedure: LEFT TOTAL KNEE ARTHROPLASTY;  Surgeon: Vernetta Lonni GRADE, MD;  Location: [facility];  Service: Orthopedics;  Laterality: Left;    Family History  Problem Relation Age of Onset   Diabetes Mother    Diabetes Father    Heart disease Father        MI   Diabetes Sister    Macular degeneration Sister    Diabetes Brother    Heart disease Brother        Angioplasty    Social History Social History[1]  Current Outpatient Medications  Medication Sig Dispense Refill   aspirin  EC 81 MG tablet Take 81 mg by mouth daily. Swallow whole.     clopidogrel  (PLAVIX ) 75 MG tablet Take 75 mg by mouth daily.     empagliflozin  (JARDIANCE ) 10 MG TABS tablet Take 10 mg by mouth daily.     glimepiride  (AMARYL ) 2 MG tablet Take 1 mg by mouth daily as needed (high  blood sugar).     levothyroxine  (SYNTHROID ) 100 MCG tablet Take 100 mcg by mouth daily before breakfast.     metoprolol  succinate (TOPROL -XL) 25 MG 24 hr tablet Take 0.5 tablets (12.5 mg total) by mouth at bedtime. 30 tablet 3   rosuvastatin  (CRESTOR ) 20 MG tablet Take 1 tablet (20 mg total) by mouth daily. 30 tablet 3   sitaGLIPtin -metformin  (JANUMET ) 50-1000 MG tablet Take 1 tablet by mouth 2 (two) times daily with a meal.     Vitamin D , Ergocalciferol , (DRISDOL ) 1.25 MG (50000 UNIT) CAPS capsule Take 50,000 Units by mouth every 7 (seven) days.     No current facility-administered medications for this visit.    Allergies[2]  Review of Systems  Constitutional:  Positive for fatigue. Negative for activity change.  HENT:  Negative for trouble swallowing.   Respiratory:  Positive for  shortness of breath.   Cardiovascular:  Negative for chest pain.  Genitourinary:  Negative for difficulty urinating.  Musculoskeletal:  Positive for arthralgias.  Neurological:  Negative for weakness.  All other systems reviewed and are negative.   BP (!) 140/78   Pulse 72   Resp 18   Ht 5' 3 (1.6 m)   Wt 160 lb (72.6 kg)   SpO2 95% Comment: RA  BMI 28.34 kg/m  Physical Exam Vitals reviewed.  Constitutional:      General: She is not in acute distress.    Appearance: Normal appearance.  HENT:     Head: Normocephalic and atraumatic.  Eyes:     General: No scleral icterus.    Extraocular Movements: Extraocular movements intact.  Neck:     Vascular: No carotid bruit.  Cardiovascular:     Rate and Rhythm: Normal rate and regular rhythm.     Heart sounds: Normal heart sounds. No murmur heard.    No friction rub. No gallop.  Pulmonary:     Effort: Pulmonary effort is normal. No respiratory distress.     Breath sounds: Normal breath sounds. No wheezing.  Abdominal:     General: There is no distension.     Palpations: Abdomen is soft.  Skin:    General: Skin is warm and dry.  Neurological:     General: No focal deficit present.     Mental Status: She is alert and oriented to person, place, and time.     Cranial Nerves: No cranial nerve deficit.     Motor: No weakness.      Diagnostic Tests: Cardiac cath Conclusion      Dist LM lesion is 50% stenosed.   Ost LAD to Prox LAD lesion is 80% stenosed.   1st Diag lesion is 40% stenosed.   Prox Cx to Mid Cx lesion is 60% stenosed.   Prox RCA to Mid RCA lesion is 60% stenosed.   1.  Severe multivessel disease consisting of distal left main 50% lesion, high-grade serial and calcified lesions of the proximal LAD involving first diagonal ostium, and FFR positive left circumflex (FFR angiography 0.74) and RCA disease (FFR angiography 0.68). 2.  LVEDP of 13 mmHg   Recommendation: Given history of type 2 diabetes and  multivessel disease, will refer for outpatient cardiothoracic surgical evaluation.  I personally reviewed the cath images.  %0% left main and 3 vessel CAD  Impression: Krystal Lang is a 69 yo woman with a history of type 2 diabetes, stage 3A chronic kidney disease, aortic atherosclerosis, hypothyroidism, anxiety, arthritis and recently diagnosed 3 vessel CAD.  Left main and 3  vessel CAD- CABG indicated for survival benefit and relief of symptoms  In my opinion best option is multivessel CABG.  Other options include hybrid type procedures with CABG to LAD and diagonal and PCi of RCA and circumflex.  I discussed the general nature of the procedure, including the need for general anesthesia, the incisions to be used, the use of cardiopulmonary bypass, and the use of temporary pacemaker wires and drainage tubes postoperatively with Mrs and Dr Claudene.  We discussed the expected hospital stay, overall recovery and short and long term outcomes. I informed them of the indications, risks, benefits and alternatives.   They understand the risks include, but are not limited to death, stroke, MI, DVT/PE, bleeding, possible need for transfusion, infections, cardiac arrhythmias, as well as other organ system dysfunction including respiratory, renal, or GI complications.    She is considering going to [facility] for hybrid type procedure with MIDCAB/ PCI  Plan: She will let us  know if she wants to proceed with CABG  Elspeth JAYSON Millers, MD [facility] [phone]     [1]  Social History Tobacco Use   Smoking status: Never   Smokeless tobacco: Never  Vaping Use   Vaping status: Never Used  Substance Use Topics   Alcohol use: No   Drug use: No  [2] No Known Allergies

## 2024-04-26 ENCOUNTER — Encounter: Admitting: Thoracic Surgery (Cardiothoracic Vascular Surgery)

## 2024-06-17 ENCOUNTER — Ambulatory Visit: Admitting: Internal Medicine

## 2024-08-11 ENCOUNTER — Ambulatory Visit: Admitting: Orthopaedic Surgery
